# Patient Record
Sex: Male | Born: 1960 | Race: Black or African American | Hispanic: No | Marital: Married | Smoking: Never smoker
Health system: Southern US, Community
[De-identification: ages and names within clinical notes are randomized; demographics above are authoritative.]

## PROBLEM LIST (undated history)

## (undated) DIAGNOSIS — N289 Disorder of kidney and ureter, unspecified: Secondary | ICD-10-CM

## (undated) DIAGNOSIS — I10 Essential (primary) hypertension: Secondary | ICD-10-CM

## (undated) DIAGNOSIS — N189 Chronic kidney disease, unspecified: Secondary | ICD-10-CM

## (undated) DIAGNOSIS — I639 Cerebral infarction, unspecified: Secondary | ICD-10-CM

## (undated) DIAGNOSIS — E119 Type 2 diabetes mellitus without complications: Secondary | ICD-10-CM

## (undated) HISTORY — PX: HERNIA REPAIR: SHX51

## (undated) HISTORY — PX: BRAIN SURGERY: SHX531

## (undated) HISTORY — PX: GASTROSTOMY TUBE PLACEMENT: SHX655

## (undated) HISTORY — DX: Chronic kidney disease, unspecified: N18.9

## (undated) HISTORY — DX: Disorder of kidney and ureter, unspecified: N28.9

## (undated) HISTORY — PX: TRACHEOSTOMY: SUR1362

---

## 2014-02-08 DIAGNOSIS — I10 Essential (primary) hypertension: Secondary | ICD-10-CM | POA: Diagnosis not present

## 2014-02-08 DIAGNOSIS — N183 Chronic kidney disease, stage 3 (moderate): Secondary | ICD-10-CM | POA: Diagnosis not present

## 2014-02-08 DIAGNOSIS — E1129 Type 2 diabetes mellitus with other diabetic kidney complication: Secondary | ICD-10-CM | POA: Diagnosis not present

## 2014-02-08 DIAGNOSIS — I69959 Hemiplegia and hemiparesis following unspecified cerebrovascular disease affecting unspecified side: Secondary | ICD-10-CM | POA: Diagnosis not present

## 2014-03-09 DIAGNOSIS — E1129 Type 2 diabetes mellitus with other diabetic kidney complication: Secondary | ICD-10-CM | POA: Diagnosis not present

## 2014-03-09 DIAGNOSIS — I69351 Hemiplegia and hemiparesis following cerebral infarction affecting right dominant side: Secondary | ICD-10-CM | POA: Diagnosis not present

## 2014-03-09 DIAGNOSIS — I10 Essential (primary) hypertension: Secondary | ICD-10-CM | POA: Diagnosis not present

## 2014-03-09 DIAGNOSIS — G4733 Obstructive sleep apnea (adult) (pediatric): Secondary | ICD-10-CM | POA: Diagnosis not present

## 2014-03-09 DIAGNOSIS — Z1389 Encounter for screening for other disorder: Secondary | ICD-10-CM | POA: Diagnosis not present

## 2014-03-09 DIAGNOSIS — Z77011 Contact with and (suspected) exposure to lead: Secondary | ICD-10-CM | POA: Diagnosis not present

## 2014-06-06 ENCOUNTER — Encounter (HOSPITAL_COMMUNITY): Payer: Self-pay | Admitting: *Deleted

## 2014-06-06 ENCOUNTER — Emergency Department (HOSPITAL_COMMUNITY): Payer: Medicare Other

## 2014-06-06 ENCOUNTER — Emergency Department (HOSPITAL_COMMUNITY)
Admission: EM | Admit: 2014-06-06 | Discharge: 2014-06-06 | Disposition: A | Discharge status: Home / Self Care | Payer: Medicare Other | Attending: Emergency Medicine | Admitting: Emergency Medicine

## 2014-06-06 DIAGNOSIS — R404 Transient alteration of awareness: Secondary | ICD-10-CM | POA: Diagnosis not present

## 2014-06-06 DIAGNOSIS — R609 Edema, unspecified: Secondary | ICD-10-CM | POA: Insufficient documentation

## 2014-06-06 DIAGNOSIS — I10 Essential (primary) hypertension: Secondary | ICD-10-CM | POA: Insufficient documentation

## 2014-06-06 DIAGNOSIS — Z794 Long term (current) use of insulin: Secondary | ICD-10-CM | POA: Insufficient documentation

## 2014-06-06 DIAGNOSIS — K029 Dental caries, unspecified: Secondary | ICD-10-CM | POA: Diagnosis not present

## 2014-06-06 DIAGNOSIS — Z7951 Long term (current) use of inhaled steroids: Secondary | ICD-10-CM | POA: Insufficient documentation

## 2014-06-06 DIAGNOSIS — E119 Type 2 diabetes mellitus without complications: Secondary | ICD-10-CM | POA: Diagnosis not present

## 2014-06-06 DIAGNOSIS — Z8673 Personal history of transient ischemic attack (TIA), and cerebral infarction without residual deficits: Secondary | ICD-10-CM | POA: Insufficient documentation

## 2014-06-06 DIAGNOSIS — R220 Localized swelling, mass and lump, head: Secondary | ICD-10-CM | POA: Diagnosis not present

## 2014-06-06 DIAGNOSIS — M2550 Pain in unspecified joint: Secondary | ICD-10-CM | POA: Insufficient documentation

## 2014-06-06 DIAGNOSIS — R6884 Jaw pain: Secondary | ICD-10-CM | POA: Diagnosis not present

## 2014-06-06 DIAGNOSIS — Z79899 Other long term (current) drug therapy: Secondary | ICD-10-CM | POA: Diagnosis not present

## 2014-06-06 DIAGNOSIS — R531 Weakness: Secondary | ICD-10-CM | POA: Diagnosis not present

## 2014-06-06 HISTORY — DX: Essential (primary) hypertension: I10

## 2014-06-06 HISTORY — DX: Type 2 diabetes mellitus without complications: E11.9

## 2014-06-06 HISTORY — DX: Cerebral infarction, unspecified: I63.9

## 2014-06-06 LAB — CBC WITH DIFFERENTIAL/PLATELET
Basophils Absolute: 0 10*3/uL (ref 0.0–0.1)
Basophils Relative: 0 % (ref 0–1)
Eosinophils Absolute: 0.1 10*3/uL (ref 0.0–0.7)
Eosinophils Relative: 1 % (ref 0–5)
HEMATOCRIT: 40.1 % (ref 39.0–52.0)
HEMOGLOBIN: 13.9 g/dL (ref 13.0–17.0)
Lymphocytes Relative: 32 % (ref 12–46)
Lymphs Abs: 1.3 10*3/uL (ref 0.7–4.0)
MCH: 28.4 pg (ref 26.0–34.0)
MCHC: 34.7 g/dL (ref 30.0–36.0)
MCV: 82 fL (ref 78.0–100.0)
MONO ABS: 0.4 10*3/uL (ref 0.1–1.0)
MONOS PCT: 9 % (ref 3–12)
NEUTROS ABS: 2.4 10*3/uL (ref 1.7–7.7)
Neutrophils Relative %: 58 % (ref 43–77)
Platelets: 195 10*3/uL (ref 150–400)
RBC: 4.89 MIL/uL (ref 4.22–5.81)
RDW: 13.3 % (ref 11.5–15.5)
WBC: 4.2 10*3/uL (ref 4.0–10.5)

## 2014-06-06 LAB — BASIC METABOLIC PANEL
Anion gap: 5 (ref 5–15)
BUN: 15 mg/dL (ref 6–20)
CALCIUM: 8.7 mg/dL — AB (ref 8.9–10.3)
CO2: 27 mmol/L (ref 22–32)
CREATININE: 1.47 mg/dL — AB (ref 0.61–1.24)
Chloride: 105 mmol/L (ref 101–111)
GFR calc Af Amer: >60 mL/min (ref >60)
GFR, EST NON AFRICAN AMERICAN: 53 mL/min — AB (ref >60)
GLUCOSE: 302 mg/dL — AB (ref 70–99)
Potassium: 3.3 mmol/L — ABNORMAL LOW (ref 3.5–5.1)
Sodium: 137 mmol/L (ref 135–145)

## 2014-06-06 LAB — CBG MONITORING, ED: GLUCOSE-CAPILLARY: 264 mg/dL — AB (ref 70–99)

## 2014-06-06 MED ORDER — HYDROCODONE-ACETAMINOPHEN 5-325 MG PO TABS
1.0000 | ORAL_TABLET | Freq: Once | ORAL | Status: AC
  Administered 2014-06-06: 1 via ORAL
  Filled 2014-06-06: qty 1

## 2014-06-06 MED ORDER — PENICILLIN V POTASSIUM 500 MG PO TABS: 1000.0000 mg | ORAL_TABLET | Freq: Two times a day (BID) | ORAL | Status: DC

## 2014-06-06 MED ORDER — CLINDAMYCIN PHOSPHATE 600 MG/50ML IV SOLN
600.0000 mg | Freq: Once | INTRAVENOUS | Status: AC
  Administered 2014-06-06: 600 mg via INTRAVENOUS
  Filled 2014-06-06: qty 50

## 2014-06-06 MED ORDER — HYDROCODONE-ACETAMINOPHEN 5-325 MG PO TABS: 1.0000 | ORAL_TABLET | Freq: Four times a day (QID) | ORAL | Status: DC | PRN

## 2014-06-06 NOTE — ED Notes (Signed)
Awake. Verbally responsive. A/O x4. Resp even and unlabored. No audible adventitious breath sounds noted. ABC's intact. SR on monitor. No reaction noted to IV ABT.

## 2014-06-06 NOTE — Progress Notes (Signed)
2:30pm. CSW received consult from RN for transportation assistance. CSW met with pt and wife. Family has just moved to [place] supports established, and have tried multiple times to reach friends/neighbors but as yet unable to. Are not familiar with bus routes, and pt is weak and has bilateral foot pain with walking. Pt is able to ambulate. CSW provided patient with taxi voucher. Made RN aware to call taxi [facility] [place], [phone])  [place] Worker [facility] phone: [phone]

## 2014-06-06 NOTE — Discharge Instructions (Signed)
Dental Pain A tooth ache may be caused by cavities (tooth decay). Cavities expose the nerve of the tooth to air and hot or cold temperatures. It may come from an infection or abscess (also called a boil or furuncle) around your tooth. It is also often caused by dental caries (tooth decay). This causes the pain you are having. DIAGNOSIS  Your caregiver can diagnose this problem by exam. TREATMENT   If caused by an infection, it may be treated with medications which kill germs (antibiotics) and pain medications as prescribed by your caregiver. Take medications as directed.  Only take over-the-counter or prescription medicines for pain, discomfort, or fever as directed by your caregiver.  Whether the tooth ache today is caused by infection or dental disease, you should see your dentist as soon as possible for further care. SEEK MEDICAL CARE IF: The exam and treatment you received today has been provided on an emergency basis only. This is not a substitute for complete medical or dental care. If your problem worsens or new problems (symptoms) appear, and you are unable to meet with your dentist, call or return to this location. SEEK IMMEDIATE MEDICAL CARE IF:   You have a fever.  You develop redness and swelling of your face, jaw, or neck.  You are unable to open your mouth.  You have severe pain uncontrolled by pain medicine. MAKE SURE YOU:   Understand these instructions.  Will watch your condition.  Will get help right away if you are not doing well or get worse. Document Released: 01/22/2005 Document Revised: 04/16/2011 Document Reviewed: 09/10/2007 [facility] Patient Information 2015 [place], [place]. This information is not intended to replace advice given to you by your health care provider. Make sure you discuss any questions you have with your health care provider.      Dental Caries Dental caries (also called tooth decay) is the most common oral disease. It can occur at any  age but is more common in children and young adults.  HOW DENTAL CARIES DEVELOPS  The process of decay begins when bacteria and foods (particularly sugars and starches) combine in your mouth to produce plaque. Plaque is a substance that sticks to the hard, outer surface of a tooth (enamel). The bacteria in plaque produce acids that attack enamel. These acids may also attack the root surface of a tooth (cementum) if it is exposed. Repeated attacks dissolve these surfaces and create holes in the tooth (cavities). If left untreated, the acids destroy the other layers of the tooth.  RISK FACTORS  Frequent sipping of sugary beverages.   Frequent snacking on sugary and starchy foods, especially those that easily get stuck in the teeth.   Poor oral hygiene.   Dry mouth.   Substance abuse such as methamphetamine abuse.   Broken or poor-fitting dental restorations.   Eating disorders.   Gastroesophageal reflux disease (GERD).   Certain radiation treatments to the head and neck. SYMPTOMS In the early stages of dental caries, symptoms are seldom present. Sometimes white, chalky areas may be seen on the enamel or other tooth layers. In later stages, symptoms may include:  Pits and holes on the enamel.  Toothache after sweet, hot, or cold foods or drinks are consumed.  Pain around the tooth.  Swelling around the tooth. DIAGNOSIS  Most of the time, dental caries is detected during a regular dental checkup. A diagnosis is made after a thorough medical and dental history is taken and the surfaces of your teeth are  checked for signs of dental caries. Sometimes special instruments, such as lasers, are used to check for dental caries. Dental X-ray exams may be taken so that areas not visible to the eye (such as between the contact areas of the teeth) can be checked for cavities.  TREATMENT  If dental caries is in its early stages, it may be reversed with a fluoride treatment or an application  of a remineralizing agent at the dental office. Thorough brushing and flossing at home is needed to aid these treatments. If it is in its later stages, treatment depends on the location and extent of tooth destruction:   If a small area of the tooth has been destroyed, the destroyed area will be removed and cavities will be filled with a material such as gold, silver amalgam, or composite resin.   If a large area of the tooth has been destroyed, the destroyed area will be removed and a cap (crown) will be fitted over the remaining tooth structure.   If the center part of the tooth (pulp) is affected, a procedure called a root canal will be needed before a filling or crown can be placed.   If most of the tooth has been destroyed, the tooth may need to be pulled (extracted). HOME CARE INSTRUCTIONS You can prevent, stop, or reverse dental caries at home by practicing good oral hygiene. Good oral hygiene includes:  Thoroughly cleaning your teeth at least twice a day with a toothbrush and dental floss.   Using a fluoride toothpaste. A fluoride mouth rinse may also be used if recommended by your dentist or health care provider.   Restricting the amount of sugary and starchy foods and sugary liquids you consume.   Avoiding frequent snacking on these foods and sipping of these liquids.   Keeping regular visits with a dentist for checkups and cleanings. PREVENTION   Practice good oral hygiene.  Consider a dental sealant. A dental sealant is a coating material that is applied by your dentist to the pits and grooves of teeth. The sealant prevents food from being trapped in them. It may protect the teeth for several years.  Ask about fluoride supplements if you live in a community without fluorinated water or with water that has a low fluoride content. Use fluoride supplements as directed by your dentist or health care provider.  Allow fluoride varnish applications to teeth if directed by  your dentist or health care provider. Document Released: 10/14/2001 Document Revised: 06/08/2013 Document Reviewed: 01/25/2012 [facility] Patient Information 2015 [place], [place]. This information is not intended to replace advice given to you by your health care provider. Make sure you discuss any questions you have with your health care provider.   Hyperglycemia Hyperglycemia occurs when the glucose (sugar) in your blood is too high. Hyperglycemia can happen for many reasons, but it most often happens to people who do not know they have diabetes or are not managing their diabetes properly.  CAUSES  Whether you have diabetes or not, there are other causes of hyperglycemia. Hyperglycemia can occur when you have diabetes, but it can also occur in other situations that you might not be as aware of, such as: Diabetes  If you have diabetes and are having problems controlling your blood glucose, hyperglycemia could occur because of some of the following reasons:  Not following your meal plan.  Not taking your diabetes medications or not taking it properly.  Exercising less or doing less activity than you normally do.  Being  sick. Pre-diabetes  This cannot be ignored. Before people develop Type 2 diabetes, they almost always have "pre-diabetes." This is when your blood glucose levels are higher than normal, but not yet high enough to be diagnosed as diabetes. Research has shown that some long-term damage to the body, especially the heart and circulatory system, may already be occurring during pre-diabetes. If you take action to manage your blood glucose when you have pre-diabetes, you may delay or prevent Type 2 diabetes from developing. Stress  If you have diabetes, you may be "diet" controlled or on oral medications or insulin to control your diabetes. However, you may find that your blood glucose is higher than usual in the hospital whether you have diabetes or not. This is often referred to as  "stress hyperglycemia." Stress can elevate your blood glucose. This happens because of hormones put out by the body during times of stress. If stress has been the cause of your high blood glucose, it can be followed regularly by your caregiver. That way he/she can make sure your hyperglycemia does not continue to get worse or progress to diabetes. Steroids  Steroids are medications that act on the infection fighting system (immune system) to block inflammation or infection. One side effect can be a rise in blood glucose. Most people can produce enough extra insulin to allow for this rise, but for those who cannot, steroids make blood glucose levels go even higher. It is not unusual for steroid treatments to "uncover" diabetes that is developing. It is not always possible to determine if the hyperglycemia will go away after the steroids are stopped. A special blood test called an A1c is sometimes done to determine if your blood glucose was elevated before the steroids were started. SYMPTOMS  Thirsty.  Frequent urination.  Dry mouth.  Blurred vision.  Tired or fatigue.  Weakness.  Sleepy.  Tingling in feet or leg. DIAGNOSIS  Diagnosis is made by monitoring blood glucose in one or all of the following ways:  A1c test. This is a chemical found in your blood.  Fingerstick blood glucose monitoring.  Laboratory results. TREATMENT  First, knowing the cause of the hyperglycemia is important before the hyperglycemia can be treated. Treatment may include, but is not be limited to:  Education.  Change or adjustment in medications.  Change or adjustment in meal plan.  Treatment for an illness, infection, etc.  More frequent blood glucose monitoring.  Change in exercise plan.  Decreasing or stopping steroids.  Lifestyle changes. HOME CARE INSTRUCTIONS   Test your blood glucose as directed.  Exercise regularly. Your caregiver will give you instructions about exercise. Pre-diabetes  or diabetes which comes on with stress is helped by exercising.  Eat wholesome, balanced meals. Eat often and at regular, fixed times. Your caregiver or nutritionist will give you a meal plan to guide your sugar intake.  Being at an ideal weight is important. If needed, losing as little as 10 to 15 pounds may help improve blood glucose levels. SEEK MEDICAL CARE IF:   You have questions about medicine, activity, or diet.  You continue to have symptoms (problems such as increased thirst, urination, or weight gain). SEEK IMMEDIATE MEDICAL CARE IF:   You are vomiting or have diarrhea.  Your breath smells fruity.  You are breathing faster or slower.  You are very sleepy or incoherent.  You have numbness, tingling, or pain in your feet or hands.  You have chest pain.  Your symptoms get worse even though  you have been following your caregiver's orders.  If you have any other questions or concerns. Document Released: 07/18/2000 Document Revised: 04/16/2011 Document Reviewed: 05/21/2011 [facility] Patient Information 2015 [place], [place]. This information is not intended to replace advice given to you by your health care provider. Make sure you discuss any questions you have with your health care provider.     Emergency Department Resource Guide 1) Find a Doctor and Pay Out of Pocket Although you won't have to find out who is covered by your insurance plan, it is a good idea to ask around and get recommendations. You will then need to call the office and see if the doctor you have chosen will accept you as a new patient and what types of options they offer for patients who are self-pay. Some doctors offer discounts or will set up payment plans for their patients who do not have insurance, but you will need to ask so you aren't surprised when you get to your appointment.  2) Contact Your Local Health Department Not all health departments have doctors that can see patients for sick visits,  but many do, so it is worth a call to see if yours does. If you don't know where your local health department is, you can check in your phone book. The CDC also has a tool to help you locate your state's health department, and many state websites also have listings of all of their local health departments.  3) Find a Walk-in Clinic If your illness is not likely to be very severe or complicated, you may want to try a walk in clinic. These are popping up all over the country in pharmacies, drugstores, and shopping centers. They're usually staffed by nurse practitioners or physician assistants that have been trained to treat common illnesses and complaints. They're usually fairly quick and inexpensive. However, if you have serious medical issues or chronic medical problems, these are probably not your best option.  No Primary Care Doctor: - Call Health Connect at  [phone] - they can help you locate a primary care doctor that  accepts your insurance, provides certain services, etc. - Physician Referral Service- [phone]  Chronic Pain Problems: Organization         Address  Phone   Notes  [facility]  [phone] Patients need to be referred by their primary care doctor.   Medication Assistance: Organization         Address  Phone   Notes  [facility] Medication [facility] [address] [place], [place] [address] [phone] --Must be a resident of [facility] -- Must have NO insurance coverage whatsoever (no Medicaid/ Medicare, etc.) -- The pt. MUST have a primary care doctor that directs their care regularly and follows them in the community   [facility]  [phone]   [employer]  [phone]    Agencies that provide inexpensive medical care: Organization         Address  Phone   Notes  [facility]  [phone]   [facility]    [phone]   [facility] [address] [place], [place] [address] [phone]   [facility] [address] [place] [phone]   Planned Parenthood    [phone]   [facility]    [phone]   [facility]  [address] [place], [place]  Phone:  [phone], Fax:  [phone] Hours of Operation:  9 am - 6 pm, M-F.  Also accepts Medicaid/Medicare and self-pay.  [facility]  [address], [place] Phone: [phone], Fax: [phone]. Hours of Operation:  8:30 am - 5:30 pm, M-F.  Also accepts Medicaid and self-pay.  [facility] [place] [address], [place] Phone: [phone]   [facility] [address] Natasha Bence [place], [place] [phone], Ext. [phone] Mondays & Thursdays: 7-9 AM.  First 15 patients are seen on a first come, first serve basis.    Medicaid-accepting [facility] Providers:  Organization         Address  Phone   Notes  [facility] [address],  [phone] Also accepts self-pay patients.  [facility] [address] Laurell Josephs [place], [place]  [phone]   [facility] [address], [place] [phone]   [facility] [address], [place] [phone]   Renaye Rakers [address], [place]   [phone] Only accepts [place] Access [place] patients after they have their name applied to their card.   Self-Pay (no insurance) in [facility]:  Organization         Address  Phone   Notes  Sickle Cell Patients, [facility] Medicine [address] [place], [place] [phone]   [facility] [address] [place], [place] [phone]   [facility]  [address], [place] [phone]   [facility]/Dr. Osei-Bonsu  [address]  [place] or [address], [place] [phone] Phone number for both [place] and [place] locations is the same.  [facility] [address] [place] [phone]   [facility] [address], [place] or [address] [phone]   [facility] [address] [place] [phone], phone; [phone], fax Sees patients 1st and 3rd Saturday of every month.  Must not qualify for public or private insurance (i.e. Medicaid, Medicare, [place] Health Choice, Veterans' Benefits)  Household income should be no more than 200% of the poverty level The clinic cannot treat you if you are pregnant or think you are pregnant  Sexually transmitted diseases are not treated at the clinic.    Dental Care: Organization         Address  Phone  Notes  [facility] [address] [place], [place] [phone] Accepts children up to age 61 who are enrolled in [place] or [place] Health Choice; pregnant women with a Medicaid card; and children who have applied for Medicaid or [place] Health Choice, but were declined, whose parents can pay a reduced fee at time of service.  [facility]  [address], [place] [phone] Accepts children up to age 4 who are enrolled in [place] or [place] Health Choice; pregnant women with a Medicaid card; and children who have applied for Medicaid or [place] Health Choice, but were declined, whose parents can pay a reduced fee at time of service.  [facility]  [address] [place], [place] [phone] Patients are seen by appointment only.  Walk-ins are not accepted. [facility] will see patients 69 years of age and older. Monday - Tuesday (8am-5pm) Most Wednesdays (8:30-5pm) $30 per visit, cash only  [facility]  [address]  Dr, [facility] [phone] Patients are seen by appointment only. Walk-ins are not accepted. [facility] will see patients 42 years of age and older. One Wednesday Evening (Monthly: Volunteer Based).  $30 per visit, cash only  [employer]  [phone] for adults; Children under age 32, call Graduate Pediatric Dentistry at [phone]. Children aged 54-14, please call [phone] to request a pediatric application.  Dental services are provided in all areas of dental care including fillings, crowns and bridges, complete and partial dentures, implants, gum treatment, root canals, and extractions. Preventive care is also provided. Treatment is provided to both adults and children. Patients are selected via a lottery and there is often a waiting list.   [facility] [address] [place]  [phone] [email or web address]   [facility] [address] [place], [place] [phone], Ext. [phone] Second and Fourth Thursday of each month, opens at 6:30 AM; Clinic ends at 9 AM.  Patients are seen on a first-come first-served basis, and a limited number are seen during each clinic.   [facility]  [address] Ether Griffins [place], [place] [phone]   Eligibility Requirements You must have lived in [place], [place], or [place] counties for at least the last three months.   You cannot be eligible for state or federal sponsored [place], including [employer], [place], or [employer].   You generally cannot be eligible for healthcare insurance through your employer.    How to apply: Eligibility screenings are held every Tuesday and Wednesday afternoon from 1:00 pm until 4:00 pm. You do not need an appointment for the interview!  [facility] [address] [place], [place] [phone]   [facility] Health Department  [phone]   [facility] Health Department  [phone]   [facility] Health Department  [phone]    Behavioral Health Resources in the Community: Intensive Outpatient Programs Organization         Address  Phone  Notes  [facility] [address], [place], [place] [phone]   [facility] Outpatient [address] [place], [place] [phone]   [facility] [address] [place], [place]  [phone]   [facility] Mental Health [address],  [place], [place] [phone] or [phone]   Substance Abuse Resources Organization         Address  Phone  Notes  [facility]  [phone]   [facility]  [phone]   [facility]  [phone]   Floydene Flock  [phone]   Residential & Outpatient Substance Abuse Program  [phone]   Psychological Services Organization         Address  Phone  Notes  [facility] Behavioral Health  [phone]   [facility] Services  [phone]   [facility] Mental Health [address] [place] [phone] or [phone]    Mobile Crisis Teams Organization         Address  Phone  Notes  [facility], Mobile Crisis Care Unit  [phone]   [facility]  [address] [place], [place] [phone]   [facility] [address] [place] [phone]  Self-Help/Support Groups Organization         Address  Phone             Notes  [facility] [place] - variety of support groups  336- I7437963 Call for more information  Narcotics Anonymous (NA), [facility] [address], [employer] [place]  2 meetings at this location   Statistician         Address  Phone  Notes  [facility] [address],    [place]  [phone]   [facility]  [address], [place] [address], [place], [place] [phone]   [facility] Treatment Facility [address] [place], [place]  [phone] Admissions: 8am-3pm M-F  [facility] [address],    [place], [place] [phone]   [facility] [address] [place], [place], [place] [phone]   [facility] [address]  [place], [place] [phone]   [facility] - Intensive Outpatient [address] Laurell Josephs [address], [place], [place] [phone]   [facility] ([facility] [address] [place]  [place], [place] [phone] or [phone]   [facility] ([facility]) [address], [place], [place] [phone] Accepts Medicaid  [facility] [address]  [place] [phone] Substance Abuse/Addiction Treatment   [facility] Organization         Address  Phone  Notes  [facility]  [phone]   Angie Fava, PhD [address] [place]vin [place], [place]   [phone] or [phone]   [facility] Behavioral   [address] [place], [place] [phone]   [facility] [address], [place], [place] [phone] Insurance/Medicaid/sponsorship through [facility] [address]                                    [place], [place] [phone] Therapy/tele-psych/case  [facility] [address], [place] [phone]    Dr. Lolly Mustache  [phone]   [facility]  [employer] [facility] Dept. 1) [address], [place] 2) [address], [place] 3)  [address], [place] [phone]  [phone]   [facility] Child Abuse Hotline 618 152 1860 or [phone] (After Hours)

## 2014-06-06 NOTE — ED Provider Notes (Signed)
CSN: [id number]     Arrival date & time 06/06/14  1135 History   First MD Initiated Contact with Patient 06/06/14 1141     Chief Complaint  Patient presents with  . Foot Pain  . Jaw Pain     (Consider location/radiation/quality/duration/timing/severity/associated sxs/prior Treatment) HPI  54 year old male presents with right-sided jaw pain for the past 3 days. Similar symptoms when he had severe dental disease that eventually led to a stroke causing right-sided deficits. The patient denies any chest pain or shortness of breath. Family nurse states they think is right face is slightly swollen. No redness. No fevers or chills. He is also been complaining of bilateral foot pain with walking. Has had some mild swelling but this is unchanged from several months. No unilateral swelling. No injuries. Denies any pain at this time.  Past Medical History  Diagnosis Date  . Diabetes mellitus without complication   . Stroke   . Hypertension    No past surgical history on file. No family history on file. History  Substance Use Topics  . Smoking status: Never Smoker   . Smokeless tobacco: Not on file  . Alcohol Use: No    Review of Systems  Constitutional: Negative for fever.  HENT: Positive for dental problem. Negative for trouble swallowing and voice change.   Respiratory: Negative for shortness of breath.   Cardiovascular: Negative for chest pain and leg swelling.  Gastrointestinal: Negative for vomiting.  Musculoskeletal: Positive for arthralgias.  All other systems reviewed and are negative.     Allergies  Contrast media  Home Medications   Prior to Admission medications   Medication Sig Start Date End Date Taking? Authorizing Provider  amLODipine (NORVASC) 10 MG tablet Take 10 mg by mouth daily.   Yes Historical Provider, MD  atorvastatin (LIPITOR) 40 MG tablet Take 40 mg by mouth at bedtime.   Yes Historical Provider, MD  carvedilol (COREG) 25 MG tablet Take 50 mg by mouth 2  (two) times daily with a meal.   Yes Historical Provider, MD  cloNIDine (CATAPRES) 0.2 MG tablet Take 0.2 mg by mouth 3 (three) times daily.   Yes Historical Provider, MD  fluticasone (FLONASE) 50 MCG/ACT nasal spray Place 1 spray into both nostrils daily.   Yes Historical Provider, MD  furosemide (LASIX) 20 MG tablet Take 20 mg by mouth 2 (two) times daily.   Yes Historical Provider, MD  glimepiride (AMARYL) 4 MG tablet Take 4 mg by mouth 2 (two) times daily.   Yes Historical Provider, MD  hydrALAZINE (APRESOLINE) 50 MG tablet Take 50 mg by mouth 3 (three) times daily.   Yes Historical Provider, MD  insulin aspart (NOVOLOG) 100 UNIT/ML injection Inject 28-32 Units into the skin 3 (three) times daily before meals.   Yes Historical Provider, MD  insulin detemir (LEVEMIR) 100 UNIT/ML injection Inject 48 Units into the skin at bedtime.   Yes Historical Provider, MD   BP 128/74 mmHg  Pulse 62  Temp(Src) 97.3 F (36.3 C) (Oral)  Resp 18  SpO2 99% Physical Exam  Constitutional: He is oriented to person, place, and time. He appears well-developed and well-nourished.  HENT:  Head: Normocephalic and atraumatic.  Right Ear: External ear normal.  Left Ear: External ear normal.  Nose: Nose normal.  Mouth/Throat: Oropharynx is clear and moist. No oropharyngeal exudate.    No focal tenderness, swelling or warmth to right face/jaw  Eyes: Right eye exhibits no discharge. Left eye exhibits no discharge.  Neck: Neck supple.  Cardiovascular: Normal rate, regular rhythm, normal heart sounds and intact distal pulses.   Pulses:      Dorsalis pedis pulses are 2+ on the right side, and 2+ on the left side.  Pulmonary/Chest: Effort normal and breath sounds normal. He has no rales.  Abdominal: Soft. He exhibits no distension. There is no tenderness.  Musculoskeletal: He exhibits edema (1+ pedal edema).       Right ankle: He exhibits normal range of motion. No tenderness.       Left ankle: He exhibits normal  range of motion. No tenderness.       Right foot: There is normal range of motion and no tenderness.       Left foot: There is normal range of motion and no tenderness.  Poorly upkept toe nails but no signs of acute infection  Neurological: He is alert and oriented to person, place, and time.  Skin: Skin is warm and dry.  Nursing note and vitals reviewed.   ED Course  Procedures (including critical care time) Labs Review Labs Reviewed  BASIC METABOLIC PANEL - Abnormal; Notable for the following:    Potassium 3.3 (*)    Glucose, Bld 302 (*)    Creatinine, Ser 1.47 (*)    Calcium 8.7 (*)    GFR calc non Af Amer 53 (*)    All other components within normal limits  CBG MONITORING, ED - Abnormal; Notable for the following:    Glucose-Capillary 264 (*)    All other components within normal limits  CBC WITH DIFFERENTIAL/PLATELET    Imaging Review Ct Maxillofacial Wo Cm  06/06/2014   CLINICAL DATA:  Right jaw pain/swelling x2 days. History of cranial aneurysm surgery.  EXAM: CT MAXILLOFACIAL WITHOUT CONTRAST  TECHNIQUE: Multidetector CT imaging of the maxillofacial structures was performed. Multiplanar CT image reconstructions were also generated. A small metallic BB was placed on the right temple in order to reliably differentiate right from left.  COMPARISON:  None.  FINDINGS: No evidence of maxillofacial fracture.  Mandible is intact. Bilateral mandibular condyles are well-seated in the TMJs.  No evidence of odontogenic abscess.  The bilateral parotid and submandibular glands are unremarkable.  The visualized paranasal sinuses are essentially clear. The mastoid air cells are unopacified.  Bilateral orbits, including the globes and retroconal soft tissues, are within normal limits.  Visualized brain parenchyma is unremarkable.  Cervical spine is notable for degenerative changes at C6-7.  Postsurgical changes involving the left frontal bone. No evidence of calvarial fracture.  IMPRESSION:  Negative maxillofacial CT.   Electronically Signed   By: Charline Bills M.D.   On: 06/06/2014 13:22     EKG Interpretation None      MDM   Final diagnoses:  Dental caries    Patient has evidence of right mandibular caries but no evidence of more severe disease. Given that he is a diabetic and complaining of facial swelling (I do not appreciate this) he was given a dose of IV antibiotics. WBC is normal, no fevers, no signs of systemic disease. Will treat with oral antibiotics and oral pain control. As for his bilateral leg pain with walking, there is no evidence of acute bony injury, current tenderness, or current trauma to suggest needing an x-ray. Minimal pedal edema that is unchanged from his normal. No unilateral swelling to suggest a DVT. Discussed importance of finding a PCP given he recently moved to this area, as well as better control of his glucose.    Pricilla Loveless,  MD 06/06/14 1610

## 2014-06-06 NOTE — ED Notes (Signed)
Pt complains of pain and swelling in his feet and jaw for the past 2 days. Pt has hx of diabetes and stroke, has weakness on his right side. CBG 386.

## 2014-06-06 NOTE — ED Notes (Signed)
Patient transported to CT 

## 2014-06-11 ENCOUNTER — Ambulatory Visit: Payer: Medicare Other | Attending: Internal Medicine | Admitting: Internal Medicine

## 2014-06-11 ENCOUNTER — Encounter: Payer: Self-pay | Admitting: Internal Medicine

## 2014-06-11 VITALS — BP 137/91 | HR 73 | Temp 97.6°F | Resp 16 | Ht 71.5 in | Wt 265.0 lb

## 2014-06-11 DIAGNOSIS — E1122 Type 2 diabetes mellitus with diabetic chronic kidney disease: Secondary | ICD-10-CM | POA: Insufficient documentation

## 2014-06-11 DIAGNOSIS — I69320 Aphasia following cerebral infarction: Secondary | ICD-10-CM | POA: Diagnosis not present

## 2014-06-11 DIAGNOSIS — I10 Essential (primary) hypertension: Secondary | ICD-10-CM | POA: Diagnosis not present

## 2014-06-11 DIAGNOSIS — K0889 Other specified disorders of teeth and supporting structures: Secondary | ICD-10-CM

## 2014-06-11 DIAGNOSIS — Z794 Long term (current) use of insulin: Secondary | ICD-10-CM | POA: Diagnosis not present

## 2014-06-11 DIAGNOSIS — Z8673 Personal history of transient ischemic attack (TIA), and cerebral infarction without residual deficits: Secondary | ICD-10-CM | POA: Diagnosis not present

## 2014-06-11 DIAGNOSIS — K088 Other specified disorders of teeth and supporting structures: Secondary | ICD-10-CM | POA: Insufficient documentation

## 2014-06-11 DIAGNOSIS — E119 Type 2 diabetes mellitus without complications: Secondary | ICD-10-CM

## 2014-06-11 DIAGNOSIS — I129 Hypertensive chronic kidney disease with stage 1 through stage 4 chronic kidney disease, or unspecified chronic kidney disease: Secondary | ICD-10-CM | POA: Insufficient documentation

## 2014-06-11 DIAGNOSIS — N189 Chronic kidney disease, unspecified: Secondary | ICD-10-CM | POA: Insufficient documentation

## 2014-06-11 DIAGNOSIS — E785 Hyperlipidemia, unspecified: Secondary | ICD-10-CM | POA: Diagnosis not present

## 2014-06-11 DIAGNOSIS — Z7951 Long term (current) use of inhaled steroids: Secondary | ICD-10-CM | POA: Diagnosis not present

## 2014-06-11 DIAGNOSIS — I69351 Hemiplegia and hemiparesis following cerebral infarction affecting right dominant side: Secondary | ICD-10-CM | POA: Insufficient documentation

## 2014-06-11 LAB — POCT GLYCOSYLATED HEMOGLOBIN (HGB A1C): HEMOGLOBIN A1C: 9.9

## 2014-06-11 LAB — GLUCOSE, POCT (MANUAL RESULT ENTRY): POC Glucose: 93 mg/dl (ref 70–99)

## 2014-06-11 MED ORDER — CLONIDINE HCL 0.2 MG PO TABS: 0.2000 mg | ORAL_TABLET | Freq: Three times a day (TID) | ORAL | Status: DC

## 2014-06-11 MED ORDER — AMLODIPINE BESYLATE 10 MG PO TABS: 10.0000 mg | ORAL_TABLET | Freq: Every day | ORAL | Status: DC

## 2014-06-11 MED ORDER — FUROSEMIDE 20 MG PO TABS: 20.0000 mg | ORAL_TABLET | Freq: Two times a day (BID) | ORAL | Status: DC

## 2014-06-11 MED ORDER — INSULIN ASPART 100 UNIT/ML ~~LOC~~ SOLN
28.0000 [IU] | Freq: Three times a day (TID) | SUBCUTANEOUS | Status: DC
Start: 2014-06-11 — End: 2014-08-12

## 2014-06-11 MED ORDER — INSULIN DETEMIR 100 UNIT/ML ~~LOC~~ SOLN: 48.0000 [IU] | Freq: Every day | SUBCUTANEOUS | Status: DC

## 2014-06-11 MED ORDER — ATORVASTATIN CALCIUM 40 MG PO TABS: 40.0000 mg | ORAL_TABLET | Freq: Every day | ORAL | Status: DC

## 2014-06-11 MED ORDER — CARVEDILOL 25 MG PO TABS: 50.0000 mg | ORAL_TABLET | Freq: Two times a day (BID) | ORAL | Status: DC

## 2014-06-11 MED ORDER — HYDRALAZINE HCL 50 MG PO TABS: 50.0000 mg | ORAL_TABLET | Freq: Three times a day (TID) | ORAL | Status: DC

## 2014-06-11 MED ORDER — GLIMEPIRIDE 4 MG PO TABS: 4.0000 mg | ORAL_TABLET | Freq: Two times a day (BID) | ORAL | Status: DC

## 2014-06-11 NOTE — Progress Notes (Signed)
Patient ID: Justin EeRichard Molina, male   DOB: 04/04/1960, [age] y.o.   MRN: [id number]  [id number]:[id number]  [id number]  DOB - 04/04/1960  CC:  Chief Complaint  Patient presents with  . Establish Care       HPI: Justin Molina is a 54 y.o. male here today to establish medical care. 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  Patient's wife gives history. They recently moved here from [place] and need to establish with a primary care provider. He currently takes 48 units of levemir and 32 units of Novolog TID. She is unsure of his last hemoglobin a1c. She would like a referral to a dentist because he has been having a significant amount of dental pain and was recently seen in the ER. She reports that the left side of his face continues to have swelling even with antibiotic use and she is concerned about the infection.  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 She is requesting a podiatry visit to continue care from [place].    Allergies  Allergen Reactions  . Contrast Media [Iodinated Diagnostic Agents] Nausea And Vomiting and Swelling   Past Medical History  Diagnosis Date  . Diabetes mellitus without complication   . Stroke   . Hypertension    Current Outpatient Prescriptions on File Prior to Visit  Medication Sig Dispense Refill  . amLODipine (NORVASC) 10 MG tablet Take 10 mg by mouth daily.    Marland Kitchen. atorvastatin (LIPITOR) 40 MG tablet Take 40 mg by mouth at bedtime.    . carvedilol (COREG) 25 MG tablet Take 50 mg by mouth 2 (two) times daily with a meal.    . cloNIDine (CATAPRES) 0.2 MG tablet Take 0.2 mg by mouth 3 (three) times daily.    . fluticasone (FLONASE) 50 MCG/ACT nasal spray Place 1 spray into both nostrils daily.    . furosemide (LASIX) 20 MG tablet Take 20 mg by mouth 2 (two) times daily.    Marland Kitchen. glimepiride (AMARYL) 4 MG tablet Take 4 mg  by mouth 2 (two) times daily.    . hydrALAZINE (APRESOLINE) 50 MG tablet Take 50 mg by mouth 3 (three) times daily.    . insulin aspart (NOVOLOG) 100 UNIT/ML injection Inject 28-32 Units into the skin 3 (three) times daily before meals.    . insulin detemir (LEVEMIR) 100 UNIT/ML injection Inject 48 Units into the skin at bedtime.    . penicillin v potassium (VEETID) 500 MG tablet Take 2 tablets (1,000 mg total) by mouth 2 (two) times daily. X 7 days 28 tablet 0  . HYDROcodone-acetaminophen (NORCO) 5-325 MG per tablet Take 1 tablet by mouth every 6 (six) hours as needed for severe pain. (Patient not taking: Reported on 06/11/2014) 20 tablet 0   No current facility-administered medications on file prior to visit.   History reviewed. No pertinent family history. History   Social History  . Marital Status: Married    Spouse Name: N/A  . Number of Children: N/A  . Years of Education: N/A   Occupational History  . Not on file.   Social History Main Topics  . Smoking status: Never Smoker   . Smokeless tobacco: Not on file  . Alcohol Use: No  . Drug Use: Not on file  . Sexual Activity: Not on file   Other Topics Concern  . Not on file  Social History Narrative    Review of Systems  Genitourinary: Positive for frequency.  Neurological: Positive for dizziness and tingling.  Endo/Heme/Allergies: Positive for polydipsia.  All other systems reviewed and are negative.   Objective:   Filed Vitals:   06/11/14 1145  BP: 137/91  Pulse: 73  Temp: 97.6 F (36.4 C)  Resp: 16    Physical Exam  Constitutional: He is oriented to person, place, and time.  HENT:  Right Ear: External ear normal.  Left Ear: External ear normal.  Mouth/Throat: Oropharynx is clear and moist.  Eyes: EOM are normal. Pupils are equal, round, and reactive to light.  Neck: Normal range of motion. Neck supple.  Cardiovascular: Normal rate, regular rhythm and normal heart sounds.   Pulses:      Dorsalis pedis  pulses are 2+ on the right side, and 2+ on the left side.       Posterior tibial pulses are 2+ on the right side, and 2+ on the left side.  Pulmonary/Chest: Effort normal and breath sounds normal.  Feet:  Right Foot:  Skin Integrity: Negative for skin breakdown.  Left Foot:  Skin Integrity: Negative for skin breakdown.  Neurological: He is alert and oriented to person, place, and time.  Global aphasia  Psychiatric: He has a normal mood and affect.     Lab Results  Component Value Date   WBC 4.2 06/06/2014   HGB 13.9 06/06/2014   HCT 40.1 06/06/2014   MCV 82.0 06/06/2014   PLT 195 06/06/2014   Lab Results  Component Value Date   CREATININE 1.47* 06/06/2014   BUN 15 06/06/2014   NA 137 06/06/2014   K 3.3* 06/06/2014   CL 105 06/06/2014   CO2 27 06/06/2014    Lab Results  Component Value Date   HGBA1C 9.90 06/11/2014   Lipid Panel  No results found for: CHOL, TRIG, HDL, CHOLHDL, VLDL, LDLCALC     Assessment and plan:   Justin Molina was seen today for establish care.  Diagnoses and all orders for this visit:  Type 2 diabetes mellitus without complication Orders: -     Glucose (CBG) -     HgB A1c -     Ambulatory referral to Podiatry -     insulin detemir (LEVEMIR) 100 UNIT/ML injection; Inject 0.48 mLs (48 Units total) into the skin at bedtime. -     insulin aspart (NOVOLOG) 100 UNIT/ML injection; Inject 28-32 Units into the skin 3 (three) times daily before meals. Continue current regimen. Will have patient return with blood sugar log   Essential hypertension Orders: -    refill hydrALAZINE (APRESOLINE) 50 MG tablet; Take 1 tablet (50 mg total) by mouth 3 (three) times daily. -    Refill glimepiride (AMARYL) 4 MG tablet; Take 1 tablet (4 mg total) by mouth 2 (two) times daily. -    refill cloNIDine (CATAPRES) 0.2 MG tablet; Take 1 tablet (0.2 mg total) by mouth 3 (three) times daily. -     Refill carvedilol (COREG) 25 MG tablet; Take 2 tablets (50 mg total) by  mouth 2 (two) times daily with a meal. -     Refill amLODipine (NORVASC) 10 MG tablet; Take 1 tablet (10 mg total) by mouth daily. -     Ambulatory referral to Cardiology---per wife request, wants to continue with cardiology since he had one in [place]. 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   CKD (chronic kidney disease), unspecified stage Orders: -     Refill furosemide (LASIX) 20 MG tablet; Take 1 tablet (20 mg total) by mouth 2 (two) times daily.  HLD (hyperlipidemia) Orders: -    refill atorvastatin (LIPITOR) 40 MG tablet; Take 1 tablet (40 mg total) by mouth at bedtime. Education provided on proper lifestyle changes in order to lower cholesterol. Patient advised to maintain healthy weight and to keep total fat intake at 25-35% of total calories and carbohydrates 50-60% of total daily calories. Explained how high cholesterol places patient at risk for heart disease. Patient placed on appropriate medication and repeat labs in 6 months   Pain, dental Orders: -     Ambulatory referral to Dentistry   Return in about 1 week (around 06/18/2014) for Lab Visit and 3 mo PCP .    Holland Commons, NP-C [facility] [phone] 06/11/2014, 12:18 PM

## 2014-06-11 NOTE — Progress Notes (Signed)
Pt is here to establish care. Pt needs to see a podiatrist. Pt has a history of diabetes, HTN and he suffered 2 strokes.

## 2014-06-16 ENCOUNTER — Other Ambulatory Visit: Payer: Self-pay | Admitting: Internal Medicine

## 2014-06-16 ENCOUNTER — Telehealth: Payer: Self-pay | Admitting: Internal Medicine

## 2014-06-16 DIAGNOSIS — K0889 Other specified disorders of teeth and supporting structures: Secondary | ICD-10-CM

## 2014-06-16 MED ORDER — ACETAMINOPHEN-CODEINE #3 300-30 MG PO TABS: 1.0000 | ORAL_TABLET | Freq: Three times a day (TID) | ORAL | Status: DC | PRN

## 2014-06-16 NOTE — Telephone Encounter (Signed)
Patient's wife called requesting to speak to nurse regarding referrals, and wanting to know what patient can do has been in pain and not feeling better. Please f/u with patient

## 2014-06-22 ENCOUNTER — Ambulatory Visit: Payer: Self-pay | Admitting: Neurology

## 2014-06-24 ENCOUNTER — Ambulatory Visit: Payer: Medicare Other | Admitting: Neurology

## 2014-06-25 ENCOUNTER — Encounter: Payer: Self-pay | Admitting: Neurology

## 2014-07-04 ENCOUNTER — Emergency Department (HOSPITAL_COMMUNITY)
Admission: EM | Admit: 2014-07-04 | Discharge: 2014-07-04 | Disposition: A | Discharge status: Home / Self Care | Payer: Medicare Other | Attending: Emergency Medicine | Admitting: Emergency Medicine

## 2014-07-04 ENCOUNTER — Encounter (HOSPITAL_COMMUNITY): Payer: Self-pay | Admitting: Family Medicine

## 2014-07-04 DIAGNOSIS — I10 Essential (primary) hypertension: Secondary | ICD-10-CM | POA: Diagnosis not present

## 2014-07-04 DIAGNOSIS — K1379 Other lesions of oral mucosa: Secondary | ICD-10-CM

## 2014-07-04 DIAGNOSIS — Z794 Long term (current) use of insulin: Secondary | ICD-10-CM | POA: Diagnosis not present

## 2014-07-04 DIAGNOSIS — K088 Other specified disorders of teeth and supporting structures: Secondary | ICD-10-CM | POA: Insufficient documentation

## 2014-07-04 DIAGNOSIS — K029 Dental caries, unspecified: Secondary | ICD-10-CM | POA: Insufficient documentation

## 2014-07-04 DIAGNOSIS — Z79899 Other long term (current) drug therapy: Secondary | ICD-10-CM | POA: Insufficient documentation

## 2014-07-04 DIAGNOSIS — Z7951 Long term (current) use of inhaled steroids: Secondary | ICD-10-CM | POA: Diagnosis not present

## 2014-07-04 DIAGNOSIS — E119 Type 2 diabetes mellitus without complications: Secondary | ICD-10-CM | POA: Insufficient documentation

## 2014-07-04 DIAGNOSIS — Z8673 Personal history of transient ischemic attack (TIA), and cerebral infarction without residual deficits: Secondary | ICD-10-CM | POA: Insufficient documentation

## 2014-07-04 MED ORDER — OXYCODONE-ACETAMINOPHEN 7.5-325 MG PO TABS: 1.0000 | ORAL_TABLET | ORAL | Status: DC | PRN

## 2014-07-04 MED ORDER — OXYCODONE-ACETAMINOPHEN 5-325 MG PO TABS
1.0000 | ORAL_TABLET | Freq: Once | ORAL | Status: AC
  Administered 2014-07-04: 1 via ORAL

## 2014-07-04 MED ORDER — OXYCODONE-ACETAMINOPHEN 5-325 MG PO TABS
ORAL_TABLET | ORAL | Status: AC
  Filled 2014-07-04: qty 1

## 2014-07-04 NOTE — ED Notes (Signed)
Pt here for mouth pain. sts has been going on for a month. sts he has been on abx and pain meds in the past. sts also pt very anxious and has been slow to respond today. sts hx of stroke with right side deficit.

## 2014-07-04 NOTE — ED Notes (Signed)
Pt has hx of aneurysm x 2, stroke in 2009, mini stroke in May 2015, pt moved here from [place] -- wife states that pt had infected teeth last year causing ministroke.  States Percocet has not helped yet today-  Was seen at [facility] 1 month ago for same, received antibiotics IV and rx for antibiotics and pain meds. , wife called 12 dentists and no one would see him with medicare. Medicaid has not gone through.

## 2014-07-04 NOTE — ED Notes (Signed)
Pt d/c'd from continuous pulse oximetry and blood pressure cuff;pt already dressed; visitors at bedside; wheelchair outside door for discharge

## 2014-07-04 NOTE — ED Notes (Signed)
[facility], states that has not had follow up-- wife concerned about neuropathy and pain.

## 2014-07-04 NOTE — ED Provider Notes (Addendum)
CSN: [id number]     Arrival date & time 07/04/14  1246 History   First MD Initiated Contact with Patient 07/04/14 1507     Chief Complaint  Patient presents with  . Dental Pain     (Consider location/radiation/quality/duration/timing/severity/associated sxs/prior Treatment) HPI Comments: Pt here c/o one month of right jaw and maxillary pain--seen here a month ago for same and given pain meds and abx--sx did slightly improve but now worse--pain is sharp and worse with movement--denies fever, chills--no facial swelling--h/o tooth extraction and is being set up to see dentistry at [facility]--sx persistent and pt not taking meds at home  Patient is a 54 y.o. male presenting with tooth pain. The history is provided by the patient and the spouse.  Dental Pain   Past Medical History  Diagnosis Date  . Diabetes mellitus without complication   . Stroke   . Hypertension    History reviewed. No pertinent past surgical history. History reviewed. No pertinent family history. History  Substance Use Topics  . Smoking status: Never Smoker   . Smokeless tobacco: Not on file  . Alcohol Use: No    Review of Systems  All other systems reviewed and are negative.     Allergies  Contrast media  Home Medications   Prior to Admission medications   Medication Sig Start Date End Date Taking? Authorizing Provider  acetaminophen-codeine (TYLENOL #3) 300-30 MG per tablet Take 1 tablet by mouth every 8 (eight) hours as needed for moderate pain. 06/16/14   Ambrose Finland, NP  amLODipine (NORVASC) 10 MG tablet Take 1 tablet (10 mg total) by mouth daily. 06/11/14   Ambrose Finland, NP  atorvastatin (LIPITOR) 40 MG tablet Take 1 tablet (40 mg total) by mouth at bedtime. 06/11/14   Ambrose Finland, NP  carvedilol (COREG) 25 MG tablet Take 2 tablets (50 mg total) by mouth 2 (two) times daily with a meal. 06/11/14   Ambrose Finland, NP  cloNIDine (CATAPRES) 0.2 MG tablet Take 1 tablet (0.2 mg total) by mouth 3 (three)  times daily. 06/11/14   Ambrose Finland, NP  fluticasone (FLONASE) 50 MCG/ACT nasal spray Place 1 spray into both nostrils daily.    Historical Provider, MD  furosemide (LASIX) 20 MG tablet Take 1 tablet (20 mg total) by mouth 2 (two) times daily. 06/11/14   Ambrose Finland, NP  glimepiride (AMARYL) 4 MG tablet Take 1 tablet (4 mg total) by mouth 2 (two) times daily. 06/11/14   Ambrose Finland, NP  hydrALAZINE (APRESOLINE) 50 MG tablet Take 1 tablet (50 mg total) by mouth 3 (three) times daily. 06/11/14   Ambrose Finland, NP  HYDROcodone-acetaminophen (NORCO) 5-325 MG per tablet Take 1 tablet by mouth every 6 (six) hours as needed for severe pain. Patient not taking: Reported on 06/11/2014 06/06/14   Pricilla Loveless, MD  insulin aspart (NOVOLOG) 100 UNIT/ML injection Inject 28-32 Units into the skin 3 (three) times daily before meals. 06/11/14   Ambrose Finland, NP  insulin detemir (LEVEMIR) 100 UNIT/ML injection Inject 0.48 mLs (48 Units total) into the skin at bedtime. 06/11/14   Ambrose Finland, NP  penicillin v potassium (VEETID) 500 MG tablet Take 2 tablets (1,000 mg total) by mouth 2 (two) times daily. X 7 days 06/06/14   Pricilla Loveless, MD   BP 146/88 mmHg  Pulse 72  Temp(Src) 98.2 F (36.8 C) (Oral)  Resp 22  SpO2 97% Physical Exam  Constitutional: He is oriented to  person, place, and time. He appears well-developed and well-nourished.  Non-toxic appearance. No distress.  HENT:  Head: Normocephalic and atraumatic.  Mouth/Throat: No oral lesions. Dental caries present. No dental abscesses. No oropharyngeal exudate or posterior oropharyngeal edema.  Right upper gum line without evidence of infection--no draining abscess  Eyes: Conjunctivae, EOM and lids are normal. Pupils are equal, round, and reactive to light.  Neck: Normal range of motion. Neck supple. No tracheal deviation present. No thyroid mass present.  Cardiovascular: Normal rate, regular rhythm and normal heart sounds.  Exam reveals no gallop.   No  murmur heard. Pulmonary/Chest: Effort normal and breath sounds normal. No stridor. No respiratory distress. He has no decreased breath sounds. He has no wheezes. He has no rhonchi. He has no rales.  Abdominal: Soft. Normal appearance and bowel sounds are normal. He exhibits no distension. There is no tenderness. There is no rebound and no CVA tenderness.  Musculoskeletal: Normal range of motion. He exhibits no edema or tenderness.  Neurological: He is alert and oriented to person, place, and time. He has normal strength. No cranial nerve deficit or sensory deficit. GCS eye subscore is 4. GCS verbal subscore is 5. GCS motor subscore is 6.  At baseline per patient  Skin: Skin is warm and dry. No abrasion and no rash noted.  Psychiatric: He has a normal mood and affect. His speech is normal and behavior is normal.  Nursing note and vitals reviewed.   ED Course  Procedures (including critical care time) Labs Review Labs Reviewed - No data to display  Imaging Review No results found.   EKG Interpretation None      MDM   Final diagnoses:  None    Pt without evidence of facial abscess, no infection at prior extraction, diffuse dental carries noted without abscess, will give pain meds and pt encouraged to f/u with dental    Lorre NickAnthony Virginie Josten, MD 07/04/14 1528  Lorre NickAnthony Alyanah Elliott, MD 07/13/14 1145

## 2014-07-04 NOTE — Discharge Instructions (Signed)
Return here for fever, facial swelling, trouble swallowing, or any other problems--call the dental clinic that you were given a referral to set up an appointment to be seen sooner

## 2014-07-13 DIAGNOSIS — L03032 Cellulitis of left toe: Secondary | ICD-10-CM | POA: Diagnosis not present

## 2014-07-13 DIAGNOSIS — M792 Neuralgia and neuritis, unspecified: Secondary | ICD-10-CM | POA: Diagnosis not present

## 2014-07-13 DIAGNOSIS — M79609 Pain in unspecified limb: Secondary | ICD-10-CM | POA: Diagnosis not present

## 2014-07-13 DIAGNOSIS — B351 Tinea unguium: Secondary | ICD-10-CM | POA: Diagnosis not present

## 2014-07-13 DIAGNOSIS — L03031 Cellulitis of right toe: Secondary | ICD-10-CM | POA: Diagnosis not present

## 2014-07-15 ENCOUNTER — Ambulatory Visit: Payer: Medicare Other | Admitting: Neurology

## 2014-07-19 ENCOUNTER — Telehealth: Payer: Self-pay | Admitting: Internal Medicine

## 2014-07-19 NOTE — Telephone Encounter (Signed)
Patient will have medical records released to [facility] [employer] in order to request extension on electric bill deadline , due to medical issues, patient uses cpap machine. Patient wants to make sure CPAP was discussed during last office visit. Please f/u with confirmation.

## 2014-07-27 DIAGNOSIS — T8189XD Other complications of procedures, not elsewhere classified, subsequent encounter: Secondary | ICD-10-CM | POA: Diagnosis not present

## 2014-07-27 DIAGNOSIS — T8189XA Other complications of procedures, not elsewhere classified, initial encounter: Secondary | ICD-10-CM | POA: Diagnosis not present

## 2014-07-29 ENCOUNTER — Ambulatory Visit: Payer: Medicare Other | Admitting: Neurology

## 2014-08-03 DIAGNOSIS — T8189XD Other complications of procedures, not elsewhere classified, subsequent encounter: Secondary | ICD-10-CM | POA: Diagnosis not present

## 2014-08-04 ENCOUNTER — Telehealth: Payer: Self-pay | Admitting: Internal Medicine

## 2014-08-04 NOTE — Telephone Encounter (Signed)
SEE NOTE

## 2014-08-04 NOTE — Telephone Encounter (Signed)
Patient's wife called today wanting to be referred out to [facility], patient was scheduled PCP visit to f/u.

## 2014-08-10 ENCOUNTER — Ambulatory Visit: Payer: Medicare Other | Admitting: Cardiovascular Disease

## 2014-08-12 ENCOUNTER — Ambulatory Visit: Payer: Medicare Other | Admitting: Internal Medicine

## 2014-08-12 ENCOUNTER — Encounter: Payer: Self-pay | Admitting: Internal Medicine

## 2014-08-12 ENCOUNTER — Ambulatory Visit: Payer: Medicare Other | Attending: Internal Medicine | Admitting: Internal Medicine

## 2014-08-12 VITALS — BP 119/77 | HR 78 | Temp 98.3°F | Ht 71.0 in | Wt 263.0 lb

## 2014-08-12 DIAGNOSIS — E1129 Type 2 diabetes mellitus with other diabetic kidney complication: Secondary | ICD-10-CM | POA: Insufficient documentation

## 2014-08-12 DIAGNOSIS — N183 Chronic kidney disease, stage 3 unspecified: Secondary | ICD-10-CM

## 2014-08-12 DIAGNOSIS — F419 Anxiety disorder, unspecified: Secondary | ICD-10-CM | POA: Diagnosis not present

## 2014-08-12 DIAGNOSIS — K0889 Other specified disorders of teeth and supporting structures: Secondary | ICD-10-CM

## 2014-08-12 DIAGNOSIS — K088 Other specified disorders of teeth and supporting structures: Secondary | ICD-10-CM | POA: Insufficient documentation

## 2014-08-12 DIAGNOSIS — E785 Hyperlipidemia, unspecified: Secondary | ICD-10-CM | POA: Diagnosis not present

## 2014-08-12 DIAGNOSIS — I10 Essential (primary) hypertension: Secondary | ICD-10-CM | POA: Insufficient documentation

## 2014-08-12 DIAGNOSIS — E1165 Type 2 diabetes mellitus with hyperglycemia: Secondary | ICD-10-CM | POA: Insufficient documentation

## 2014-08-12 DIAGNOSIS — Z794 Long term (current) use of insulin: Secondary | ICD-10-CM | POA: Insufficient documentation

## 2014-08-12 DIAGNOSIS — I6930 Unspecified sequelae of cerebral infarction: Secondary | ICD-10-CM | POA: Diagnosis not present

## 2014-08-12 DIAGNOSIS — E1140 Type 2 diabetes mellitus with diabetic neuropathy, unspecified: Secondary | ICD-10-CM | POA: Insufficient documentation

## 2014-08-12 DIAGNOSIS — IMO0002 Reserved for concepts with insufficient information to code with codable children: Secondary | ICD-10-CM

## 2014-08-12 DIAGNOSIS — E1122 Type 2 diabetes mellitus with diabetic chronic kidney disease: Secondary | ICD-10-CM | POA: Insufficient documentation

## 2014-08-12 DIAGNOSIS — E1142 Type 2 diabetes mellitus with diabetic polyneuropathy: Secondary | ICD-10-CM | POA: Insufficient documentation

## 2014-08-12 DIAGNOSIS — E119 Type 2 diabetes mellitus without complications: Secondary | ICD-10-CM | POA: Diagnosis not present

## 2014-08-12 DIAGNOSIS — E1169 Type 2 diabetes mellitus with other specified complication: Secondary | ICD-10-CM | POA: Insufficient documentation

## 2014-08-12 HISTORY — DX: Essential (primary) hypertension: I10

## 2014-08-12 LAB — POCT URINALYSIS DIPSTICK
Bilirubin, UA: NEGATIVE
Blood, UA: NEGATIVE
GLUCOSE UA: 500
KETONES UA: NEGATIVE
Leukocytes, UA: NEGATIVE
Nitrite, UA: NEGATIVE
Protein, UA: NEGATIVE
SPEC GRAV UA: 1.01
Urobilinogen, UA: 0.2
pH, UA: 5.5

## 2014-08-12 LAB — GLUCOSE, POCT (MANUAL RESULT ENTRY)
POC GLUCOSE: 380 mg/dL — AB (ref 70–99)
POC GLUCOSE: 375 mg/dL — AB (ref 70–99)

## 2014-08-12 MED ORDER — INSULIN DETEMIR 100 UNIT/ML ~~LOC~~ SOLN: 48.0000 [IU] | Freq: Every day | SUBCUTANEOUS | Status: DC

## 2014-08-12 MED ORDER — GLUCOSE BLOOD VI STRP: ORAL_STRIP | Status: DC

## 2014-08-12 MED ORDER — BUSPIRONE HCL 10 MG PO TABS: 10.0000 mg | ORAL_TABLET | Freq: Two times a day (BID) | ORAL | Status: DC

## 2014-08-12 MED ORDER — CLONIDINE HCL 0.2 MG PO TABS
0.2000 mg | ORAL_TABLET | Freq: Three times a day (TID) | ORAL | Status: DC
Start: 2014-08-12 — End: 2015-02-10

## 2014-08-12 MED ORDER — GLIMEPIRIDE 4 MG PO TABS: 4.0000 mg | ORAL_TABLET | Freq: Two times a day (BID) | ORAL | Status: DC

## 2014-08-12 MED ORDER — ATORVASTATIN CALCIUM 40 MG PO TABS
40.0000 mg | ORAL_TABLET | Freq: Every day | ORAL | Status: DC
Start: 2014-08-12 — End: 2021-11-17

## 2014-08-12 MED ORDER — INSULIN ASPART 100 UNIT/ML ~~LOC~~ SOLN: 28.0000 [IU] | Freq: Three times a day (TID) | SUBCUTANEOUS | Status: DC

## 2014-08-12 MED ORDER — CARVEDILOL 25 MG PO TABS
50.0000 mg | ORAL_TABLET | Freq: Two times a day (BID) | ORAL | Status: DC
Start: 2014-08-12 — End: 2021-11-17

## 2014-08-12 MED ORDER — AMLODIPINE BESYLATE 10 MG PO TABS: 10.0000 mg | ORAL_TABLET | Freq: Every day | ORAL | Status: DC

## 2014-08-12 MED ORDER — HYDRALAZINE HCL 50 MG PO TABS: 50.0000 mg | ORAL_TABLET | Freq: Three times a day (TID) | ORAL | Status: DC

## 2014-08-12 NOTE — Progress Notes (Signed)
Patient here for diabetes Patient wife would like PCP to check to make sure his foot is healing appropriately after surgery She is having trouble with medication refills and wants to know how we can fix it She would like PCP to Rx lasix for patient

## 2014-08-12 NOTE — Progress Notes (Signed)
Patient ID: Justin Molina, male   DOB: 20-May-1960, 54 y.o.   MRN: [id number]  CC: f/u, referrals   HPI: Justin Molina is a 54 y.o. male here today for a follow up visit.  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 Patient has global aphasia and is present with his wife who is giving history. She reports that since our last visit the patient was approved for Medicaid and has been able to see podiatry for left great toenail removal 5 weeks ago. He was seen by [facility] and has been placed on 2 different antibiotics. She is concerned about his healing. She would like a refill of all his medications because his insurance has denied previous scripts.  Patient is due to follow up with Cardiology on 7/21. She states that he was under the care of a Cardiologist while in [place] and was scheduled to have stent placement but due to CKD he was placed on hold. She is concerned about him not having lasix but she is unable to explain why he was on the medication in the past.  She would like a referral to Nephrology to follow up on patients CKD.   Patient has No headache, No chest pain, No abdominal pain - No Nausea, No new weakness tingling or numbness, No Cough - SOB.  Allergies  Allergen Reactions  . Contrast Media [Iodinated Diagnostic Agents] Nausea And Vomiting and Swelling   Past Medical History  Diagnosis Date  . Diabetes mellitus without complication   . Stroke   . Hypertension    Current Outpatient Prescriptions on File Prior to Visit  Medication Sig Dispense Refill  . amLODipine (NORVASC) 10 MG tablet Take 1 tablet (10 mg total) by mouth daily. 30 tablet 4  . atorvastatin (LIPITOR) 40 MG tablet Take 1 tablet (40 mg total) by mouth at bedtime. 30 tablet 4  . carvedilol (COREG) 25 MG tablet Take 2 tablets (50 mg total) by mouth 2 (two) times daily with a meal. 60 tablet 4  . cloNIDine (CATAPRES) 0.2 MG  tablet Take 1 tablet (0.2 mg total) by mouth 3 (three) times daily. 30 tablet 4  . fluticasone (FLONASE) 50 MCG/ACT nasal spray Place 1 spray into both nostrils daily.    Marland Kitchen glimepiride (AMARYL) 4 MG tablet Take 1 tablet (4 mg total) by mouth 2 (two) times daily. 60 tablet 4  . hydrALAZINE (APRESOLINE) 50 MG tablet Take 1 tablet (50 mg total) by mouth 3 (three) times daily. 90 tablet 4  . insulin aspart (NOVOLOG) 100 UNIT/ML injection Inject 28-32 Units into the skin 3 (three) times daily before meals. 10 mL 6  . insulin detemir (LEVEMIR) 100 UNIT/ML injection Inject 0.48 mLs (48 Units total) into the skin at bedtime. 10 mL 5  . acetaminophen-codeine (TYLENOL #3) 300-30 MG per tablet Take 1 tablet by mouth every 8 (eight) hours as needed for moderate pain. (Patient not taking: Reported on 08/12/2014) 30 tablet 0  . furosemide (LASIX) 20 MG tablet Take 1 tablet (20 mg total) by mouth 2 (two) times daily. (Patient not taking: Reported on 08/12/2014) 60 tablet \  . HYDROcodone-acetaminophen (NORCO) 5-325 MG per tablet Take 1 tablet by mouth every 6 (six) hours as needed for severe pain. (Patient not taking: Reported on 06/11/2014) 20 tablet 0  . oxyCODONE-acetaminophen (PERCOCET) 7.5-325 MG per tablet Take 1 tablet by mouth every 4 (four) hours as needed for severe pain. (Patient not  taking: Reported on 08/12/2014) 12 tablet 0  . penicillin v potassium (VEETID) 500 MG tablet Take 2 tablets (1,000 mg total) by mouth 2 (two) times daily. X 7 days (Patient not taking: Reported on 08/12/2014) 28 tablet 0   No current facility-administered medications on file prior to visit.   No family history on file. History   Social History  . Marital Status: Married    Spouse Name: N/A  . Number of Children: N/A  . Years of Education: N/A   Occupational History  . Not on file.   Social History Main Topics  . Smoking status: Never Smoker   . Smokeless tobacco: Not on file  . Alcohol Use: No  . Drug Use: No  . Sexual  Activity: Not on file   Other Topics Concern  . Not on file   Social History Narrative    Review of Systems: Unable to get a detail ROS due to global aphasia    Objective:   Filed Vitals:   08/12/14 1734  BP: 119/77  Pulse: 78  Temp: 98.3 F (36.8 C)    Physical Exam  Cardiovascular: Normal rate, regular rhythm and normal heart sounds.   Pulmonary/Chest: Effort normal and breath sounds normal.  Abdominal: Soft. Bowel sounds are normal.  Musculoskeletal: He exhibits no edema or tenderness.  Right great toe healing open sores from nail removal   Skin: Skin is warm and dry.     Lab Results  Component Value Date   WBC 4.2 06/06/2014   HGB 13.9 06/06/2014   HCT 40.1 06/06/2014   MCV 82.0 06/06/2014   PLT 195 06/06/2014   Lab Results  Component Value Date   CREATININE 1.47* 06/06/2014   BUN 15 06/06/2014   NA 137 06/06/2014   K 3.3* 06/06/2014   CL 105 06/06/2014   CO2 27 06/06/2014    Lab Results  Component Value Date   HGBA1C 9.90 06/11/2014   Lipid Panel  No results found for: CHOL, TRIG, HDL, CHOLHDL, VLDL, LDLCALC     Assessment and plan:   Justin Molina was seen today for dental referral and diabetes.  Diagnoses and all orders for this visit:  DM type 2, uncontrolled, with renal complications Orders: -     Glucose (CBG) -     Microalbumin, urine -     Urinalysis Dipstick -     Glucose (CBG) -     glucose blood (TRUETEST TEST) test strip; Check 4 times per day for E11.29 I have encourage wife to check patients sugars and record 3 times per day for me to review medication regimen. He will return in 2-3 weeks for a cbg log review. Addressed diet, exercise, foot care, and long term diabetes complications   CKD (chronic kidney disease) stage 3, GFR 30-59 ml/min Orders: -     Ambulatory referral to Nephrology Likely a result of uncontrolled DM. Will appreciate early Nephrology consult  Essential hypertension Orders: -     amLODipine (NORVASC) 10 MG  tablet; Take 1 tablet (10 mg total) by mouth daily. -     carvedilol (COREG) 25 MG tablet; Take 2 tablets (50 mg total) by mouth 2 (two) times daily with a meal. -     cloNIDine (CATAPRES) 0.2 MG tablet; Take 1 tablet (0.2 mg total) by mouth 3 (three) times daily. -     glimepiride (AMARYL) 4 MG tablet; Take 1 tablet (4 mg total) by mouth 2 (two) times daily. -     hydrALAZINE (  APRESOLINE) 50 MG tablet; Take 1 tablet (50 mg total) by mouth 3 (three) times daily. Refilled medications. Patient blood pressure is stable and may continue on current medication.  Education on diet, exercise, and modifiable risk factors discussed. Will obtain appropriate labs as needed. Will follow up in 3-6 months.   HLD (hyperlipidemia) Orders: -     Refilled atorvastatin (LIPITOR) 40 MG tablet; Take 1 tablet (40 mg total) by mouth at bedtime. Education provided on proper lifestyle changes in order to lower cholesterol. Patient advised to maintain healthy weight and to keep total fat intake at 25-35% of total calories and carbohydrates 50-60% of total daily calories. Explained how high cholesterol places patient at risk for heart disease. Patient placed on appropriate medication and repeat labs in 6 months   Pain, dental Orders: -     Ambulatory referral to Dentistry. Will need dental referral asap due to patient medical history.   Diabetic polyneuropathy associated with type 2 diabetes mellitus Will attempt to control DM first to see if he has any improvement in neuropathy. If not may place patient on very low dose Neurotin later.   History of CVA with residual deficit Strict chronic disease control. Continue lipitor and aspirin. Not sure why patient is not on Plavix. Will refer patient to Neurology for evaluation.   Anxiety Orders: -    Begin busPIRone (BUSPAR) 10 MG tablet; Take 1 tablet (10 mg total) by mouth 2 (two) times daily. For anxiety Past records reveal that patient was on Buspar in the past which  helped his anxiety. Will place him back on today. Wife states that he gets worked up easily and he appears depressed.    Return in about 2 weeks (around 08/26/2014) for RN visit-log review and 3 mo PCP.        Ambrose Finland, NP-C [facility] [phone] 08/12/2014, 5:45 PM dd

## 2014-08-13 ENCOUNTER — Telehealth: Payer: Self-pay | Admitting: *Deleted

## 2014-08-13 LAB — MICROALBUMIN, URINE: Microalb, Ur: 0.6 mg/dL (ref <2.0)

## 2014-08-13 NOTE — Telephone Encounter (Signed)
Left message for patient's wife updating her that Justin Molina is to fax the certificate of medical necessity form.  The pharmacy employee told me the form has to be faxed to us by their corporate office and she was unable to tell me how long it would take for the fax to arrive.  I let her know that as soon as I receive the fax we would complete all information and fax it back.  The pharmacy also said that the other problem was that the patient's medicare still shows them living in [place] and that she would need to call medicare and update their address.

## 2014-08-16 ENCOUNTER — Telehealth: Payer: Self-pay | Admitting: Internal Medicine

## 2014-08-16 NOTE — Telephone Encounter (Signed)
    Expand All Collapse All   Left message for patient's wife updating her [facility]. The pharmacy employee told me the form has to be faxed to us by their corporate office and she was unable to tell me how long it would take for the fax to arrive. I let her know that as soon as I receive the fax we would complete all information and fax it back. The pharmacy also said that the other problem was that the patient's medicare still shows them living in [place] and that she would need to call medicare and update their address.       Please follow up with patient

## 2014-08-16 NOTE — Telephone Encounter (Signed)
Nurse spoke with patients wife. Wife is concerned about patients diabetic testing supplies. Nurse explained to wife that [facility] is [facility]. Nurse will call tomorrow to investigate situation with [facility].  Nurse informed wife of need to call Medicare and change address. Wife has done this 2 times but agrees to call again.

## 2014-08-17 ENCOUNTER — Other Ambulatory Visit: Payer: Self-pay | Admitting: Internal Medicine

## 2014-08-17 ENCOUNTER — Telehealth: Payer: Self-pay

## 2014-08-17 MED ORDER — LEVEMIR FLEXTOUCH 100 UNIT/ML ~~LOC~~ SOPN: 48.0000 [IU] | PEN_INJECTOR | Freq: Every day | SUBCUTANEOUS | Status: DC

## 2014-08-17 MED ORDER — NOVOLOG FLEXPEN 100 UNIT/ML ~~LOC~~ SOPN: 28.0000 [IU] | PEN_INJECTOR | Freq: Three times a day (TID) | SUBCUTANEOUS | Status: DC

## 2014-08-17 NOTE — Telephone Encounter (Signed)
Nurse called pharmacy to request form to be faxed to the attention of [facility] Donn Zanetti, RN .  Pharmacy advised nurse that request had been sent for their office [facility]. Nurse informed pharmacy that form had not been received and requested pharmacy to send another request because patient is not able to test glucose currently without supplies. Pharmacy agrees to res[facility] to [facility] to [place].

## 2014-08-17 NOTE — Telephone Encounter (Signed)
Nurse called patient at both numbers provided on chart. Reached voice mail at both numbers. Unable to leave message on either voice mail, one voicemail was full and the other wasn't set up.

## 2014-08-17 NOTE — Telephone Encounter (Signed)
Patients wife called nurse, verified patients date of birth.  Wife is aware of both insulins sent to pharmacy in flex pen form.  Patients wife voices understanding and has no further questions at this time.

## 2014-08-17 NOTE — Telephone Encounter (Signed)
Nurse called patient, reached patient's wife. Patient's date of birth verified. Wife aware of nurses call to pharmacy. Pharmacy to send form to attention of nurse.  Wife requesting insulin prescriptions to be sent in flex pen form.

## 2014-08-24 ENCOUNTER — Encounter: Payer: Self-pay | Admitting: Internal Medicine

## 2014-08-24 DIAGNOSIS — E785 Hyperlipidemia, unspecified: Secondary | ICD-10-CM | POA: Insufficient documentation

## 2014-08-24 DIAGNOSIS — N183 Chronic kidney disease, stage 3 unspecified: Secondary | ICD-10-CM | POA: Insufficient documentation

## 2014-08-24 DIAGNOSIS — F419 Anxiety disorder, unspecified: Secondary | ICD-10-CM | POA: Insufficient documentation

## 2014-08-24 NOTE — Telephone Encounter (Signed)
Patient's wife called to give fax number for a referral to [facility], the fax number is [phone].

## 2014-08-26 ENCOUNTER — Encounter: Payer: Self-pay | Admitting: Cardiovascular Disease

## 2014-08-26 ENCOUNTER — Ambulatory Visit (INDEPENDENT_AMBULATORY_CARE_PROVIDER_SITE_OTHER): Payer: Medicare Other | Admitting: Cardiovascular Disease

## 2014-08-26 VITALS — BP 140/82 | HR 70 | Ht 71.0 in | Wt 266.6 lb

## 2014-08-26 DIAGNOSIS — Z79899 Other long term (current) drug therapy: Secondary | ICD-10-CM | POA: Diagnosis not present

## 2014-08-26 DIAGNOSIS — E785 Hyperlipidemia, unspecified: Secondary | ICD-10-CM

## 2014-08-26 DIAGNOSIS — I10 Essential (primary) hypertension: Secondary | ICD-10-CM | POA: Diagnosis not present

## 2014-08-26 NOTE — Progress Notes (Signed)
08/26/2014 Justin Molina   [birth year]  [id number]  Primary Physician Justin Finland, NP Primary Cardiologist: Justin Gess MD Justin Molina   HPI:  [name] is a 54 year old moderately overweight married [place] male father of 67 children, grandfather of 12 grandchildren referred by Dr. Luna Molina for cardiovascular evaluation and be establishing our practice. He recently relocated from [facility] with his wife. He is disabled because of prior stroke occurred 10/09/07. His other problems included hypertension, hyperlipidemia and diabetes. He does have chronic renal insufficiency probably related to his diabetes and hypertension. He has never had a heart attack. He denies chest pain or shortness of breath. He is a phasic and paralyzed on the right side. Ambulates with a cane.   Current Outpatient Prescriptions  Medication Sig Dispense Refill  . amLODipine (NORVASC) 10 MG tablet Take 1 tablet (10 mg total) by mouth daily. 30 tablet 4  . atorvastatin (LIPITOR) 40 MG tablet Take 1 tablet (40 mg total) by mouth at bedtime. 30 tablet 4  . busPIRone (BUSPAR) 10 MG tablet Take 1 tablet (10 mg total) by mouth 2 (two) times daily. For anxiety 60 tablet 4  . carvedilol (COREG) 25 MG tablet Take 2 tablets (50 mg total) by mouth 2 (two) times daily with a meal. 60 tablet 4  . cloNIDine (CATAPRES) 0.2 MG tablet Take 1 tablet (0.2 mg total) by mouth 3 (three) times daily. 30 tablet 4  . fluticasone (FLONASE) 50 MCG/ACT nasal spray Place 1 spray into both nostrils daily.    . furosemide (LASIX) 20 MG tablet Take 1 tablet (20 mg total) by mouth 2 (two) times daily. 60 tablet \  . glimepiride (AMARYL) 4 MG tablet Take 1 tablet (4 mg total) by mouth 2 (two) times daily. 60 tablet 4  . glucose blood (TRUETEST TEST) test strip Check 4 times per day for E11.29 100 each 12  . hydrALAZINE (APRESOLINE) 50 MG tablet Take 1 tablet (50 mg total) by mouth 3 (three) times daily. 90 tablet 4  .  LEVEMIR FLEXTOUCH 100 UNIT/ML Pen Inject 48 Units into the skin daily at 10 pm. 15 mL 11  . NOVOLOG FLEXPEN 100 UNIT/ML FlexPen Inject 28-32 Units into the skin 3 (three) times daily with meals. 15 mL 11   No current facility-administered medications for this visit.    Allergies  Allergen Reactions  . Contrast Media [Iodinated Diagnostic Agents] Nausea And Vomiting and Swelling    History   Social History  . Marital Status: Married    Spouse Name: N/A  . Number of Children: N/A  . Years of Education: N/A   Occupational History  . Not on file.   Social History Main Topics  . Smoking status: Never Smoker   . Smokeless tobacco: Not on file  . Alcohol Use: No  . Drug Use: No  . Sexual Activity: Not on file   Other Topics Concern  . Not on file   Social History Narrative     Review of Systems: General: negative for chills, fever, night sweats or weight changes.  Cardiovascular: negative for chest pain, dyspnea on exertion, edema, orthopnea, palpitations, paroxysmal nocturnal dyspnea or shortness of breath Dermatological: negative for rash Respiratory: negative for cough or wheezing Urologic: negative for hematuria Abdominal: negative for nausea, vomiting, diarrhea, bright red blood per rectum, melena, or hematemesis Neurologic: negative for visual changes, syncope, or dizziness All other systems reviewed and are otherwise negative except as noted above.  Blood pressure 140/82, pulse 70, height  (1.803 m), weight 266 lb 9.6 oz (120.929 kg).  General appearance: alert and no distress Neck: no adenopathy, no carotid bruit, no JVD, supple, symmetrical, trachea midline and thyroid not enlarged, symmetric, no tenderness/mass/nodules Lungs: clear to auscultation bilaterally Heart: regular rate and rhythm, S1, S2 normal, no murmur, click, rub or gallop Extremities: extremities normal, atraumatic, no cyanosis or edema  EKG normal sinus rhythm at 70 with left anterior  fascicular block  And poor R-wave with progression with borderline LVH. I personally reviewed this EKG  ASSESSMENT AND PLAN:   HTN (hypertension) Hypertension blood pressure measured at 140/82. He is on amlodipine, carvedilol, clonidine and hydralazine. Continue current meds at current dosing  HLD (hyperlipidemia) History of hyperlipidemia on atorvastatin 40 mg a day. We will check a lipid and liver profile      Justin Gess MD [facility], [facility] 08/26/2014 11:04 AM

## 2014-08-26 NOTE — Assessment & Plan Note (Signed)
History of hyperlipidemia on atorvastatin 40 mg a day. We will check a lipid and liver profile 

## 2014-08-26 NOTE — Patient Instructions (Addendum)
Your physician recommends that you return for lab work FASTING - nothing to eat/drink after midnight  We request that you follow-up in: 6 months with an extender and in 1 year with Dr San Morelle will receive a reminder letter in the mail two months in advance. If you don't receive a letter, please call our office to schedule the follow-up appointment.

## 2014-08-26 NOTE — Assessment & Plan Note (Signed)
Hypertension blood pressure measured at 140/82. He is on amlodipine, carvedilol, clonidine and hydralazine. Continue current meds at current dosing

## 2014-08-27 ENCOUNTER — Emergency Department (HOSPITAL_COMMUNITY)
Admission: EM | Admit: 2014-08-27 | Discharge: 2014-08-28 | Disposition: A | Discharge status: Home / Self Care | Payer: Medicare Other | Attending: Emergency Medicine | Admitting: Emergency Medicine

## 2014-08-27 ENCOUNTER — Encounter (HOSPITAL_COMMUNITY): Payer: Self-pay | Admitting: Emergency Medicine

## 2014-08-27 ENCOUNTER — Emergency Department (HOSPITAL_COMMUNITY): Payer: Medicare Other

## 2014-08-27 DIAGNOSIS — Z8673 Personal history of transient ischemic attack (TIA), and cerebral infarction without residual deficits: Secondary | ICD-10-CM | POA: Insufficient documentation

## 2014-08-27 DIAGNOSIS — N189 Chronic kidney disease, unspecified: Secondary | ICD-10-CM | POA: Insufficient documentation

## 2014-08-27 DIAGNOSIS — E161 Other hypoglycemia: Secondary | ICD-10-CM | POA: Diagnosis not present

## 2014-08-27 DIAGNOSIS — I129 Hypertensive chronic kidney disease with stage 1 through stage 4 chronic kidney disease, or unspecified chronic kidney disease: Secondary | ICD-10-CM | POA: Diagnosis not present

## 2014-08-27 DIAGNOSIS — Z79899 Other long term (current) drug therapy: Secondary | ICD-10-CM | POA: Insufficient documentation

## 2014-08-27 DIAGNOSIS — R7309 Other abnormal glucose: Secondary | ICD-10-CM | POA: Diagnosis not present

## 2014-08-27 DIAGNOSIS — E162 Hypoglycemia, unspecified: Secondary | ICD-10-CM

## 2014-08-27 DIAGNOSIS — G9389 Other specified disorders of brain: Secondary | ICD-10-CM | POA: Diagnosis not present

## 2014-08-27 DIAGNOSIS — E11649 Type 2 diabetes mellitus with hypoglycemia without coma: Secondary | ICD-10-CM | POA: Insufficient documentation

## 2014-08-27 DIAGNOSIS — R531 Weakness: Secondary | ICD-10-CM | POA: Diagnosis not present

## 2014-08-27 LAB — CBC WITH DIFFERENTIAL/PLATELET
BASOS ABS: 0 10*3/uL (ref 0.0–0.1)
Basophils Relative: 0 % (ref 0–1)
EOS ABS: 0 10*3/uL (ref 0.0–0.7)
EOS PCT: 0 % (ref 0–5)
HEMATOCRIT: 46.4 % (ref 39.0–52.0)
Hemoglobin: 16.5 g/dL (ref 13.0–17.0)
LYMPHS PCT: 14 % (ref 12–46)
Lymphs Abs: 1.4 10*3/uL (ref 0.7–4.0)
MCH: 28.8 pg (ref 26.0–34.0)
MCHC: 35.6 g/dL (ref 30.0–36.0)
MCV: 81 fL (ref 78.0–100.0)
Monocytes Absolute: 0.5 10*3/uL (ref 0.1–1.0)
Monocytes Relative: 5 % (ref 3–12)
NEUTROS PCT: 81 % — AB (ref 43–77)
Neutro Abs: 8.1 10*3/uL — ABNORMAL HIGH (ref 1.7–7.7)
Platelets: 202 10*3/uL (ref 150–400)
RBC: 5.73 MIL/uL (ref 4.22–5.81)
RDW: 13.5 % (ref 11.5–15.5)
WBC: 10 10*3/uL (ref 4.0–10.5)

## 2014-08-27 LAB — CBG MONITORING, ED
Glucose-Capillary: 101 mg/dL — ABNORMAL HIGH (ref 65–99)
Glucose-Capillary: 110 mg/dL — ABNORMAL HIGH (ref 65–99)

## 2014-08-27 LAB — I-STAT CHEM 8, ED
BUN: 18 mg/dL (ref 6–20)
CALCIUM ION: 1.11 mmol/L — AB (ref 1.12–1.23)
CREATININE: 1.2 mg/dL (ref 0.61–1.24)
Chloride: 104 mmol/L (ref 101–111)
Glucose, Bld: 124 mg/dL — ABNORMAL HIGH (ref 65–99)
HEMATOCRIT: 52 % (ref 39.0–52.0)
HEMOGLOBIN: 17.7 g/dL — AB (ref 13.0–17.0)
Potassium: 4.4 mmol/L (ref 3.5–5.1)
Sodium: 143 mmol/L (ref 135–145)
TCO2: 27 mmol/L (ref 0–100)

## 2014-08-27 LAB — I-STAT TROPONIN, ED: Troponin i, poc: 0 ng/mL (ref 0.00–0.08)

## 2014-08-27 NOTE — ED Provider Notes (Addendum)
CSN: [id number]     Arrival date & time 08/27/14  1942 History   First MD Initiated Contact with Patient 08/27/14 2039     Chief Complaint  Patient presents with  . Hypoglycemia     (Consider location/radiation/quality/duration/timing/severity/associated sxs/prior Treatment) HPI   54 year old male with hx of stroke with residual R side weakness who is brought here via EMS from home for evaluation of hypoglycemia. History obtained through wife who is at bedside. Per wife, patient not eat his entire meal this afternoon but did take his usual insulin. Patient was found on the ground next to his bed approximately 3 hours ago. It appears that he is trying to get up but unable to. Wife states he has a grimace on his face, and appears diaphoretic. His cane was found on the ground. Unsure if patient has fallen. EMS was contacted and patient was found to have CBG of 29. Patient received D5W which has since improved his blood sugar. Per wife, patient appears to be at his baseline at this time. No recent sickness. When asked if patient has headache, neck pain, chest pain, difficulty breathing, abdominal pain, back pain, dysuria, new pain and numbness patient shakes his head and denies any symptoms.   Past Medical History  Diagnosis Date  . Diabetes mellitus without complication   . Stroke   . Hypertension   . Chronic renal insufficiency    History reviewed. No pertinent past surgical history. No family history on file. History  Substance Use Topics  . Smoking status: Never Smoker   . Smokeless tobacco: Not on file  . Alcohol Use: No    Review of Systems  All other systems reviewed and are negative.     Allergies  Contrast media  Home Medications   Prior to Admission medications   Medication Sig Start Date End Date Taking? Authorizing Provider  amLODipine (NORVASC) 10 MG tablet Take 1 tablet (10 mg total) by mouth daily. 08/12/14  Yes Ambrose Finland, NP  atorvastatin (LIPITOR) 40 MG  tablet Take 1 tablet (40 mg total) by mouth at bedtime. 08/12/14  Yes Ambrose Finland, NP  busPIRone (BUSPAR) 10 MG tablet Take 1 tablet (10 mg total) by mouth 2 (two) times daily. For anxiety Patient taking differently: Take 10 mg by mouth 2 (two) times daily as needed. For anxiety 08/12/14  Yes Ambrose Finland, NP  carvedilol (COREG) 25 MG tablet Take 2 tablets (50 mg total) by mouth 2 (two) times daily with a meal. 08/12/14  Yes Ambrose Finland, NP  cloNIDine (CATAPRES) 0.2 MG tablet Take 1 tablet (0.2 mg total) by mouth 3 (three) times daily. 08/12/14  Yes Ambrose Finland, NP  fluticasone (FLONASE) 50 MCG/ACT nasal spray Place 1 spray into both nostrils daily as needed for allergies.    Yes Historical Provider, MD  glimepiride (AMARYL) 4 MG tablet Take 1 tablet (4 mg total) by mouth 2 (two) times daily. 08/12/14  Yes Ambrose Finland, NP  glucose blood (TRUETEST TEST) test strip Check 4 times per day for E11.29 08/12/14  Yes Ambrose Finland, NP  hydrALAZINE (APRESOLINE) 50 MG tablet Take 1 tablet (50 mg total) by mouth 3 (three) times daily. 08/12/14  Yes Ambrose Finland, NP  LEVEMIR FLEXTOUCH 100 UNIT/ML Pen Inject 48 Units into the skin daily at 10 pm. 08/17/14  Yes Olugbemiga E Hyman Hopes, MD  NOVOLOG FLEXPEN 100 UNIT/ML FlexPen Inject 28-32 Units into the skin 3 (three) times daily with  meals. 08/17/14  Yes Quentin Angst, MD  furosemide (LASIX) 20 MG tablet Take 1 tablet (20 mg total) by mouth 2 (two) times daily. Patient not taking: Reported on 08/27/2014 06/11/14   Ambrose Finland, NP   BP 139/89 mmHg  Pulse 58  Resp 20  SpO2 100% Physical Exam  Constitutional: He appears well-developed and well-nourished. No distress.  African-American male, laying in bed, appears to be in no acute distress, smiling and cheerful.  HENT:  Head: Atraumatic.  Eyes: Conjunctivae and EOM are normal. Pupils are equal, round, and reactive to light.  Neck: Normal range of motion. Neck supple.  Cardiovascular: Normal rate and  regular rhythm.   Pulmonary/Chest: Effort normal and breath sounds normal.  Abdominal: Soft. There is no tenderness.  Musculoskeletal: He exhibits no edema or tenderness.  Neurological: He is alert. A sensory deficit (Decreased sensation and strength to right upper and lower extremities) is present. No cranial nerve deficit. GCS eye subscore is 4. GCS verbal subscore is 5. GCS motor subscore is 6.  Skin: No rash noted.  Psychiatric: He has a normal mood and affect.  Nursing note and vitals reviewed.   ED Course  Procedures (including critical care time)  10:02 PM Patient here for an unwitnessed fall, and having hypoglycemia. Workup initiated.  12:38 AM No documented hypoglycemia in the ER. Labs are reassuring. No signs of infection. Head CT scan without acute finding. Chest x-ray is normal. EKG is reassuring. Patient is at his baseline, appears to be in no acute distress. Patient has been monitored for the past several hours without any major changes. He is stable for discharge with outpatient follow-up with PCP as needed. Return precautions discussed. Recommend patient to eat appropriately while taking insulin.    Labs Review Labs Reviewed  CBC WITH DIFFERENTIAL/PLATELET - Abnormal; Notable for the following:    Neutrophils Relative % 81 (*)    Neutro Abs 8.1 (*)    All other components within normal limits  CBG MONITORING, ED - Abnormal; Notable for the following:    Glucose-Capillary 101 (*)    All other components within normal limits  I-STAT CHEM 8, ED - Abnormal; Notable for the following:    Glucose, Bld 124 (*)    Calcium, Ion 1.11 (*)    Hemoglobin 17.7 (*)    All other components within normal limits  CBG MONITORING, ED - Abnormal; Notable for the following:    Glucose-Capillary 110 (*)    All other components within normal limits  URINALYSIS, ROUTINE W REFLEX MICROSCOPIC (NOT AT [facility])  Rosezena Sensor, ED    Imaging Review Dg Chest 2 View  08/27/2014   CLINICAL  DATA:  54 year old male with weakness  EXAM: CHEST  2 VIEW  COMPARISON:  None.  FINDINGS: Two views of the chest demonstrate clear lungs. No pleural effusion or pneumothorax. Moderate cardiomegaly. The osseous structures are grossly unremarkable.  IMPRESSION: No acute cardiopulmonary process.  Cardiomegaly.   Electronically Signed   By: Elgie Collard M.D.   On: 08/27/2014 22:42   Ct Head Wo Contrast  08/27/2014   CLINICAL DATA:  Acute onset of hypoglycemia. Generalized weakness. Initial encounter.  EXAM: CT HEAD WITHOUT CONTRAST  TECHNIQUE: Contiguous axial images were obtained from the base of the skull through the vertex without intravenous contrast.  COMPARISON:  Maxillofacial CT performed 06/06/2014  FINDINGS: There is no evidence of acute infarction, mass lesion, or intra- or extra-axial hemorrhage on CT.  There is left-sided postoperative change, with  diffuse underlying encephalomalacia involving the left frontal and parietal lobes, and ex vacuo dilatation of the left lateral ventricle. An overlying craniotomy flap is noted, with mild chronic separation of the flap posteriorly. Postoperative change involves the left basal ganglia and portions of the left temporal lobe.  Scattered periventricular white matter change likely reflects small vessel ischemic microangiopathy.  The brainstem and fourth ventricle are within normal limits. The basal ganglia are unremarkable in appearance. The cerebral hemispheres demonstrate grossly normal gray-white differentiation. No mass effect or midline shift is seen.  There is no evidence of fracture. The orbits are within normal limits. The paranasal sinuses and mastoid air cells are well-aerated. No significant soft tissue abnormalities are seen.  IMPRESSION: 1. No acute intracranial pathology seen on CT. 2. Left-sided postoperative change, with diffuse underlying encephalomalacia involving the left frontal and parietal lobes, and ex vacuo dilatation of the left lateral  ventricle. Overlying craniotomy flap noted, with mild chronic separation of the posterior aspect of the flap. 3. Scattered small vessel ischemic microangiopathy.   Electronically Signed   By: Roanna Raider M.D.   On: 08/27/2014 22:31     EKG Interpretation None     ED ECG REPORT   Date: 08/27/2014  Rate: 67  Rhythm: normal sinus rhythm  QRS Axis: left  Intervals: PR prolonged  ST/T Wave abnormalities: nonspecific ST changes  Conduction Disutrbances:left anterior fascicular block  Narrative Interpretation: Poor R-wave progression, borderline LVH  Old EKG Reviewed: unchanged  I have personally reviewed the EKG tracing and agree with the computerized printout as noted.   MDM   Final diagnoses:  Hypoglycemia    BP 139/89 mmHg  Pulse 58  Resp 20  SpO2 100% T 97.6 Oral  I have reviewed nursing notes and vital signs. I personally viewed the imaging tests through PACS system and agrees with radiologist's intepretation I reviewed available ER/hospitalization records through the EMR   ADDENDUM: Head CT scan obtain due to pt having prior hx of stroke, was found altered, hypoglycemic and also found on the ground beside his bed.  CT was obtain to r/o skull fractures, intracranial injury, or signs of stroke.     Fayrene Helper, PA-C 08/28/14 4540  Jerelyn Scott, MD 08/28/14 0057  Fayrene Helper, PA-C 09/06/14 9811  Jerelyn Scott, MD 09/06/14 [id number]

## 2014-08-27 NOTE — ED Notes (Signed)
Pt from home EMS states he did not eat much today. Has been experiencing hypoglycemia for about 1 hr. Pt has had 25 g of D5W and temporarily raised his CBG to 93, then it dropped again to 68, per EMS. Pt has a hx of a hemorraghic stroke, but per his wife he is at his baseline.

## 2014-08-28 DIAGNOSIS — E11649 Type 2 diabetes mellitus with hypoglycemia without coma: Secondary | ICD-10-CM | POA: Diagnosis not present

## 2014-08-28 NOTE — Discharge Instructions (Signed)
Please make sure to eat appropriately when taking insulin.  Follow up with your doctor for further care.  Return if your condition worsen or if you have other concerns.  Low Blood Sugar Low blood sugar (hypoglycemia) means that the level of sugar in your blood is lower than it should be. Signs of low blood sugar include:  Getting sweaty.  Feeling hungry.  Feeling dizzy or weak.  Feeling sleepier than normal.  Feeling nervous.  Headaches.  Having a fast heartbeat. Low blood sugar can happen fast and can be an emergency. Your doctor can do tests to check your blood sugar level. You can have low blood sugar and not have diabetes. HOME CARE  Check your blood sugar as told by your doctor. If it is less than 70 mg/dl or as told by your doctor, take 1 of the following:  3 to 4 glucose tablets.   cup clear juice.   cup soda pop, not diet.  1 cup milk.  5 to 6 hard candies.  Recheck blood sugar after 15 minutes. Repeat until it is at the right level.  Eat a snack if it is more than 1 hour until the next meal.  Only take medicine as told by your doctor.  Do not skip meals. Eat on time.  Do not drink alcohol except with meals.  Check your blood glucose before driving.  Check your blood glucose before and after exercise.  Always carry treatment with you, such as glucose pills.  Always wear a medical alert bracelet if you have diabetes. GET HELP RIGHT AWAY IF:   Your blood glucose goes below 70 mg/dl or as told by your doctor, and you:  Are confused.  Are not able to swallow.  Pass out (faint).  You cannot treat yourself. You may need someone to help you.  You have low blood sugar problems often.  You have problems from your medicines.  You are not feeling better after 3 to 4 days.  You have vision changes. MAKE SURE YOU:   Understand these instructions.  Will watch this condition.  Will get help right away if you are not doing well or get  worse. Document Released: 04/18/2009 Document Revised: 04/16/2011 Document Reviewed: 04/18/2009 [facility] Patient Information 2015 [place], [place]. This information is not intended to replace advice given to you by your health care provider. Make sure you discuss any questions you have with your health care provider.

## 2014-08-30 ENCOUNTER — Telehealth: Payer: Self-pay | Admitting: Cardiovascular Disease

## 2014-08-30 NOTE — Telephone Encounter (Signed)
Received records from Dr Priscille Kluver [facility] requested by Dr Allyson Sabal.  Records given to Dr Allyson Sabal to review.  lp

## 2014-09-03 DIAGNOSIS — T8189XD Other complications of procedures, not elsewhere classified, subsequent encounter: Secondary | ICD-10-CM | POA: Diagnosis not present

## 2014-09-16 ENCOUNTER — Other Ambulatory Visit: Payer: Self-pay | Admitting: Internal Medicine

## 2014-09-22 ENCOUNTER — Telehealth: Payer: Self-pay

## 2014-09-22 NOTE — Telephone Encounter (Signed)
Nurse called patients wife, wife verified date of birth. Patient and wife are in [place] for family emergency. Nurse asked wife about neurology appointment, wife explains she has all her important appointment documents at her house but knows that he does not have appointment this month. She agrees to check calendar when they return home for neurology appointment.  Wife reports patient has not been on aspirin or Plavix.  Nurse transferred wife to front office staff to cancel appointment at [facility] on 10/06/14 due to being in [place] until first week of September.

## 2014-09-23 DIAGNOSIS — R609 Edema, unspecified: Secondary | ICD-10-CM | POA: Diagnosis not present

## 2014-09-23 DIAGNOSIS — M6281 Muscle weakness (generalized): Secondary | ICD-10-CM | POA: Diagnosis not present

## 2014-09-23 DIAGNOSIS — I10 Essential (primary) hypertension: Secondary | ICD-10-CM | POA: Diagnosis not present

## 2014-09-23 DIAGNOSIS — R2689 Other abnormalities of gait and mobility: Secondary | ICD-10-CM | POA: Diagnosis not present

## 2014-09-23 DIAGNOSIS — I69820 Aphasia following other cerebrovascular disease: Secondary | ICD-10-CM | POA: Diagnosis not present

## 2014-09-23 DIAGNOSIS — R358 Other polyuria: Secondary | ICD-10-CM | POA: Diagnosis not present

## 2014-09-23 DIAGNOSIS — E119 Type 2 diabetes mellitus without complications: Secondary | ICD-10-CM | POA: Diagnosis not present

## 2014-09-23 DIAGNOSIS — R531 Weakness: Secondary | ICD-10-CM | POA: Diagnosis not present

## 2014-10-06 ENCOUNTER — Ambulatory Visit: Payer: Medicare Other | Admitting: Internal Medicine

## 2014-10-15 ENCOUNTER — Ambulatory Visit: Payer: Medicare Other | Admitting: Internal Medicine

## 2014-10-28 ENCOUNTER — Telehealth: Payer: Self-pay | Admitting: Internal Medicine

## 2014-10-28 ENCOUNTER — Telehealth: Payer: Self-pay

## 2014-10-28 NOTE — Telephone Encounter (Signed)
Returned patient phone call and spoke with his wife Protective services has been out to the house twice this week Patient needs multiple things such as aid, extended toilet seat wheel chair etc Explained to the wife he will need to make an appt. To come in to see his provider Call transferred to front office to schedule and appt

## 2014-10-28 NOTE — Telephone Encounter (Signed)
Patiens wife called stating that he needs a letter from the doctor stating that he is disabled. Patient's wife is also requesting orders for home aid equipment. She stated that protective services visited home and that he needs a wheelchair, bar, shower chair and orthopedic shoes. Patient's wife would like a call from the nurse. Please follow up.

## 2014-11-01 NOTE — Telephone Encounter (Signed)
Patient's Wife came in requesting a document, stating that her husband had a stroke. Husband needs medical supplies for the home. Patients wife stated that he needs a wheel chair, grab bar, and an extended shower chair. She also requested a home health aid for him. Please follow up.

## 2014-11-02 ENCOUNTER — Telehealth: Payer: Self-pay

## 2014-11-02 ENCOUNTER — Other Ambulatory Visit: Payer: Self-pay

## 2014-11-02 DIAGNOSIS — I639 Cerebral infarction, unspecified: Secondary | ICD-10-CM

## 2014-11-02 MED ORDER — BATHTUB SAFETY RAIL MISC: Status: DC

## 2014-11-02 NOTE — Telephone Encounter (Signed)
Spoke with Justin Molina to let her know the prescriptions for wheel chair Shower chair and safety rails are ready to be picked up Justin Molina stated she will be in tomorrow afternoon to pick them up

## 2014-11-02 NOTE — Telephone Encounter (Signed)
May send order for these items. Find out if they need to be sent to [facility].

## 2014-11-02 NOTE — Telephone Encounter (Signed)
Returned patient phone call Patient's wife is requesting a prescription for  Shower bar, shower chair and manual wheel chair Patient suffered a stroke and protective services told her She needs these things for his safety Patient will also be dropping off a scat form that needs to be filled out For transportation purposes

## 2014-11-03 ENCOUNTER — Ambulatory Visit: Payer: Medicare Other | Admitting: Internal Medicine

## 2014-11-07 ENCOUNTER — Telehealth: Payer: Self-pay | Admitting: Internal Medicine

## 2014-11-07 DIAGNOSIS — E119 Type 2 diabetes mellitus without complications: Secondary | ICD-10-CM

## 2014-11-11 ENCOUNTER — Other Ambulatory Visit: Payer: Self-pay

## 2014-11-16 NOTE — Telephone Encounter (Signed)
Patients wife called stating that [facility] discontinues the TRU test meter that the patient used to use. [facility] stated they need a new prescription for a new glucose meter and test strips. Patient is out of test strips, and since the meter is discontinued he can not check is blood sugar. Patient would like for the prescription to be sent to [facility] on [address] [place]. Please f/u

## 2014-11-17 MED ORDER — GLUCOSE BLOOD VI STRP: ORAL_STRIP | Status: DC

## 2014-11-17 MED ORDER — ACCU-CHEK AVIVA PLUS W/DEVICE KIT: PACK | Status: DC

## 2014-11-17 MED ORDER — LANCETS 28G MISC
Status: DC
Start: 2014-11-17 — End: 2014-11-23

## 2014-11-22 ENCOUNTER — Other Ambulatory Visit: Payer: Self-pay | Admitting: Internal Medicine

## 2014-11-23 ENCOUNTER — Other Ambulatory Visit: Payer: Self-pay

## 2014-11-23 ENCOUNTER — Telehealth: Payer: Self-pay

## 2014-11-23 DIAGNOSIS — E119 Type 2 diabetes mellitus without complications: Secondary | ICD-10-CM

## 2014-11-23 MED ORDER — LANCETS 28G MISC
Status: DC
Start: 2014-11-23 — End: 2015-05-23

## 2014-11-23 MED ORDER — GLUCOSE BLOOD VI STRP: ORAL_STRIP | Status: DC

## 2014-11-23 NOTE — Telephone Encounter (Signed)
Returned patient phone call Patient not available Left message on voice mail to return our call 

## 2014-11-23 NOTE — Telephone Encounter (Signed)
Pt. Wife called request for the PCP to send a prescription for the test strips for the AVIVA Plus meter. Pt. Test his blood sugar 4 time a day and needs a prescription for 90 days. Please f/u with pt.

## 2014-11-26 NOTE — Telephone Encounter (Signed)
Patient's wife presents to clinic to pick up prescriptions for mobility devices.  Prescriptions given to wife. Patient's wife then gives a prescription for Diabetic shoes.  Prescription placed in providers box

## 2014-11-26 NOTE — Telephone Encounter (Signed)
Patients wife picked up CMN form for [facility]. Form was filled out and completed for test strips.

## 2014-12-09 ENCOUNTER — Telehealth: Payer: Self-pay

## 2014-12-22 ENCOUNTER — Other Ambulatory Visit: Payer: Self-pay | Admitting: Internal Medicine

## 2014-12-23 ENCOUNTER — Telehealth: Payer: Self-pay | Admitting: Internal Medicine

## 2014-12-23 DIAGNOSIS — I639 Cerebral infarction, unspecified: Secondary | ICD-10-CM

## 2014-12-23 NOTE — Telephone Encounter (Signed)
Patients wife called and requested a new prescription for diabetic shoes, a shower chair, and a wheel chair. Wife also requested an order for a home healthcare aide to help patient with ADL's.  Patients wife stated that the prescriptions have been written before but has not been able to come and pick up the prescriptions in a month. Staff tried to locate the prescriptions but after a month paperwork that has not been picked up is shredded. Patients wife was made aware of this.   Please f/u

## 2014-12-25 ENCOUNTER — Emergency Department (HOSPITAL_COMMUNITY)
Admission: EM | Admit: 2014-12-25 | Discharge: 2014-12-26 | Disposition: A | Discharge status: Home / Self Care | Payer: Medicare Other | Attending: Emergency Medicine | Admitting: Emergency Medicine

## 2014-12-25 ENCOUNTER — Encounter (HOSPITAL_COMMUNITY): Payer: Self-pay | Admitting: Emergency Medicine

## 2014-12-25 DIAGNOSIS — I129 Hypertensive chronic kidney disease with stage 1 through stage 4 chronic kidney disease, or unspecified chronic kidney disease: Secondary | ICD-10-CM | POA: Insufficient documentation

## 2014-12-25 DIAGNOSIS — K0889 Other specified disorders of teeth and supporting structures: Secondary | ICD-10-CM | POA: Diagnosis not present

## 2014-12-25 DIAGNOSIS — Z79899 Other long term (current) drug therapy: Secondary | ICD-10-CM | POA: Insufficient documentation

## 2014-12-25 DIAGNOSIS — Z8673 Personal history of transient ischemic attack (TIA), and cerebral infarction without residual deficits: Secondary | ICD-10-CM | POA: Insufficient documentation

## 2014-12-25 DIAGNOSIS — Z7951 Long term (current) use of inhaled steroids: Secondary | ICD-10-CM | POA: Insufficient documentation

## 2014-12-25 DIAGNOSIS — E119 Type 2 diabetes mellitus without complications: Secondary | ICD-10-CM | POA: Diagnosis not present

## 2014-12-25 DIAGNOSIS — N189 Chronic kidney disease, unspecified: Secondary | ICD-10-CM | POA: Diagnosis not present

## 2014-12-25 MED ORDER — PENICILLIN V POTASSIUM 250 MG PO TABS: 250.0000 mg | ORAL_TABLET | Freq: Four times a day (QID) | ORAL | Status: AC

## 2014-12-25 MED ORDER — HYDROCODONE-ACETAMINOPHEN 5-325 MG PO TABS
1.0000 | ORAL_TABLET | Freq: Once | ORAL | Status: AC
  Administered 2014-12-26: 1 via ORAL
  Filled 2014-12-25: qty 1

## 2014-12-25 NOTE — ED Notes (Signed)
Pt. reports upper dental pain with swelling onset this week .

## 2014-12-25 NOTE — ED Provider Notes (Signed)
CSN: [id number]     Arrival date & time 12/25/14  2329 History   First MD Initiated Contact with Patient 12/25/14 2337     Chief Complaint  Patient presents with  . Dental Pain     (Consider location/radiation/quality/duration/timing/severity/associated sxs/prior Treatment) HPI Comments: Pt comes in with c/o upper dental pain. He states that he has had an extraction but he is having pain to the area. No fever, problems swallowing or breathing. They have had a hard time finding a dentist. His wife states that he needs pain medication for the symptoms  The history is provided by the patient. No language interpreter was used.    Past Medical History  Diagnosis Date  . Diabetes mellitus without complication ([place])   . Stroke ([place])   . Hypertension   . Chronic renal insufficiency    Past Surgical History  Procedure Laterality Date  . Hernia repair    . Tracheostomy    . Brain surgery    . Gastrostomy tube placement     No family history on file. Social History  Substance Use Topics  . Smoking status: Never Smoker   . Smokeless tobacco: None  . Alcohol Use: No    Review of Systems  All other systems reviewed and are negative.     Allergies  Contrast media  Home Medications   Prior to Admission medications   Medication Sig Start Date End Date Taking? Authorizing Provider  ACCU-CHEK AVIVA PLUS test strip USE TO CHECK BLOOD SUGAR FOUR TIMES DAILY 09/16/14   Lance Bosch, NP  amLODipine (NORVASC) 10 MG tablet Take 1 tablet (10 mg total) by mouth daily. 08/12/14   Lance Bosch, NP  atorvastatin (LIPITOR) 40 MG tablet Take 1 tablet (40 mg total) by mouth at bedtime. 08/12/14   Lance Bosch, NP  Blood Glucose Monitoring Suppl (ACCU-CHEK AVIVA PLUS) W/DEVICE KIT Check blood sugar TID  & QHS 11/17/14   Lance Bosch, NP  busPIRone (BUSPAR) 10 MG tablet TAKE 1 TABLET(10 MG) BY MOUTH TWICE DAILY FOR ANXIETY 12/23/14   Lance Bosch, NP  carvedilol (COREG) 25 MG tablet Take 2  tablets (50 mg total) by mouth 2 (two) times daily with a meal. 08/12/14   Lance Bosch, NP  cloNIDine (CATAPRES) 0.2 MG tablet Take 1 tablet (0.2 mg total) by mouth 3 (three) times daily. 08/12/14   Lance Bosch, NP  fluticasone (FLONASE) 50 MCG/ACT nasal spray Place 1 spray into both nostrils daily as needed for allergies.     Historical Provider, MD  furosemide (LASIX) 20 MG tablet Take 1 tablet (20 mg total) by mouth 2 (two) times daily. Patient not taking: Reported on 08/27/2014 06/11/14   Lance Bosch, NP  glimepiride (AMARYL) 4 MG tablet Take 1 tablet (4 mg total) by mouth 2 (two) times daily. 08/12/14   Lance Bosch, NP  glucose blood (ACCU-CHEK AVIVA PLUS) test strip Use as instructed 11/23/14   Lance Bosch, NP  hydrALAZINE (APRESOLINE) 50 MG tablet Take 1 tablet (50 mg total) by mouth 3 (three) times daily. 08/12/14   Lance Bosch, NP  Lancets 28G MISC Check blood sugar TID & QHS 11/23/14   Lance Bosch, NP  LEVEMIR FLEXTOUCH 100 UNIT/ML Pen Inject 48 Units into the skin daily at 10 pm. 08/17/14   Tresa Garter, MD  Misc. Devices (BATHTUB SAFETY RAIL) MISC Patient needs safety rails for shower 11/02/14   Lance Bosch, NP  NOVOLOG FLEXPEN 100 UNIT/ML FlexPen Inject 28-32 Units into the skin 3 (three) times daily with meals. 08/17/14   Tresa Garter, MD  penicillin v potassium (VEETID) 250 MG tablet Take 1 tablet (250 mg total) by mouth 4 (four) times daily. 12/25/14 01/01/15  Glendell Docker, NP   BP 161/101 mmHg  Pulse 85  Temp(Src) 97.9 F (36.6 C) (Oral)  Resp 18  SpO2 98% Physical Exam  Constitutional: He appears well-developed and well-nourished.  HENT:  Right Ear: External ear normal.  Tender to the upper front dentition. No deformity noted  Cardiovascular: Normal rate and regular rhythm.   Pulmonary/Chest: Effort normal.  Neurological: He is alert.  Skin: Skin is warm and dry.  Nursing note and vitals reviewed.   ED Course  Procedures (including  critical care time) Labs Review Labs Reviewed - No data to display  Imaging Review No results found. I have personally reviewed and evaluated these images and lab results as part of my medical decision-making.   EKG Interpretation None      MDM   Final diagnoses:  Pain, dental    No sign of abscess. Discussed with wife that narcotics would not be given.given pcn and and dental referal:wife was upset with staff for not giving narcotics     Glendell Docker, NP 12/26/14 0004  Glendell Docker, NP 12/26/14 [place] Ward, DO 12/26/14 4098

## 2014-12-26 DIAGNOSIS — K0889 Other specified disorders of teeth and supporting structures: Secondary | ICD-10-CM | POA: Diagnosis not present

## 2014-12-26 MED ORDER — PENICILLIN V POTASSIUM 250 MG PO TABS
250.0000 mg | ORAL_TABLET | Freq: Once | ORAL | Status: AC
  Administered 2014-12-26: 250 mg via ORAL
  Filled 2014-12-26: qty 1

## 2014-12-26 MED ORDER — ACETAMINOPHEN 500 MG PO TABS: 500.0000 mg | ORAL_TABLET | Freq: Four times a day (QID) | ORAL | Status: AC | PRN

## 2014-12-26 NOTE — ED Notes (Signed)
Patient's wife stated that she needs a prescription for pain medication for the dental pain.  Patient offered prescription for OTC tylenol.  Patient and wife educated on the policy of [place] on prescriptions for dental pain.  Wife upset due to policy change and wants to speak with supervisor.  Jolaine ClickM Browning in with patient and wife.

## 2014-12-28 ENCOUNTER — Other Ambulatory Visit: Payer: Medicare Other

## 2015-01-04 ENCOUNTER — Telehealth: Payer: Self-pay

## 2015-01-04 MED ORDER — BATHTUB SAFETY RAIL MISC: Status: DC

## 2015-01-04 NOTE — Telephone Encounter (Signed)
He has to have a appointment fort documentation before home health can be ordered for insurance reasons. Usually they bring me paperwork to fill out for diabetic shoes

## 2015-01-04 NOTE — Telephone Encounter (Signed)
Patients wife returned phone call, advised of prescriptions ready to be picked up, she also wanted to know about the paperwork for the diabetic shoes. She stated that the forms have already been dropped off.  Please f/u

## 2015-01-04 NOTE — Telephone Encounter (Signed)
Attempted to contact patient's wife to let her know her orders have been printed for wheelchair Shower bars and shower chair Not available at this time  Message left to return call call RX in fan folder in front office

## 2015-01-04 NOTE — Telephone Encounter (Signed)
Patient's wife is requesting a RX  For diabetic shoes as well as home health care to help with ADL's i have already reprinted the scripts for shower and wheel chair Can you help me with this thanks

## 2015-01-10 ENCOUNTER — Telehealth: Payer: Self-pay

## 2015-01-10 NOTE — Telephone Encounter (Signed)
Returned phone call to patient's wife Miss Justin Molina not available Unable to leave voice mail Mail box is full

## 2015-01-11 ENCOUNTER — Other Ambulatory Visit: Payer: Medicare Other

## 2015-01-11 ENCOUNTER — Inpatient Hospital Stay: Payer: Medicare Other | Admitting: Internal Medicine

## 2015-01-14 NOTE — Telephone Encounter (Signed)
Patient returned phone call, please f/u with pt.    °

## 2015-01-24 ENCOUNTER — Other Ambulatory Visit: Payer: Self-pay | Admitting: Internal Medicine

## 2015-01-26 ENCOUNTER — Inpatient Hospital Stay: Payer: Medicare Other | Admitting: Internal Medicine

## 2015-01-27 DIAGNOSIS — R7309 Other abnormal glucose: Secondary | ICD-10-CM | POA: Diagnosis not present

## 2015-01-27 DIAGNOSIS — E161 Other hypoglycemia: Secondary | ICD-10-CM | POA: Diagnosis not present

## 2015-02-01 NOTE — Telephone Encounter (Signed)
error 

## 2015-02-02 ENCOUNTER — Other Ambulatory Visit: Payer: Self-pay | Admitting: Internal Medicine

## 2015-02-10 ENCOUNTER — Other Ambulatory Visit: Payer: Self-pay | Admitting: *Deleted

## 2015-02-10 ENCOUNTER — Other Ambulatory Visit: Payer: Self-pay | Admitting: Internal Medicine

## 2015-02-10 DIAGNOSIS — I10 Essential (primary) hypertension: Secondary | ICD-10-CM

## 2015-02-10 MED ORDER — CLONIDINE HCL 0.2 MG PO TABS: 0.2000 mg | ORAL_TABLET | Freq: Three times a day (TID) | ORAL | Status: DC

## 2015-02-24 ENCOUNTER — Other Ambulatory Visit: Payer: Self-pay | Admitting: Internal Medicine

## 2015-02-28 DIAGNOSIS — Z794 Long term (current) use of insulin: Secondary | ICD-10-CM | POA: Diagnosis not present

## 2015-02-28 DIAGNOSIS — I10 Essential (primary) hypertension: Secondary | ICD-10-CM | POA: Diagnosis not present

## 2015-02-28 DIAGNOSIS — I679 Cerebrovascular disease, unspecified: Secondary | ICD-10-CM | POA: Diagnosis not present

## 2015-02-28 DIAGNOSIS — Z125 Encounter for screening for malignant neoplasm of prostate: Secondary | ICD-10-CM | POA: Diagnosis not present

## 2015-02-28 DIAGNOSIS — E1165 Type 2 diabetes mellitus with hyperglycemia: Secondary | ICD-10-CM | POA: Diagnosis not present

## 2015-02-28 DIAGNOSIS — F419 Anxiety disorder, unspecified: Secondary | ICD-10-CM | POA: Diagnosis not present

## 2015-02-28 DIAGNOSIS — E7800 Pure hypercholesterolemia, unspecified: Secondary | ICD-10-CM | POA: Diagnosis not present

## 2015-02-28 DIAGNOSIS — Z7984 Long term (current) use of oral hypoglycemic drugs: Secondary | ICD-10-CM | POA: Diagnosis not present

## 2015-03-02 DIAGNOSIS — I69320 Aphasia following cerebral infarction: Secondary | ICD-10-CM | POA: Diagnosis not present

## 2015-03-02 DIAGNOSIS — I129 Hypertensive chronic kidney disease with stage 1 through stage 4 chronic kidney disease, or unspecified chronic kidney disease: Secondary | ICD-10-CM | POA: Diagnosis not present

## 2015-03-02 DIAGNOSIS — I69351 Hemiplegia and hemiparesis following cerebral infarction affecting right dominant side: Secondary | ICD-10-CM | POA: Diagnosis not present

## 2015-03-02 DIAGNOSIS — N189 Chronic kidney disease, unspecified: Secondary | ICD-10-CM | POA: Diagnosis not present

## 2015-03-02 DIAGNOSIS — E785 Hyperlipidemia, unspecified: Secondary | ICD-10-CM | POA: Diagnosis not present

## 2015-03-02 DIAGNOSIS — F419 Anxiety disorder, unspecified: Secondary | ICD-10-CM | POA: Diagnosis not present

## 2015-03-02 DIAGNOSIS — E1122 Type 2 diabetes mellitus with diabetic chronic kidney disease: Secondary | ICD-10-CM | POA: Diagnosis not present

## 2015-03-02 DIAGNOSIS — Z7984 Long term (current) use of oral hypoglycemic drugs: Secondary | ICD-10-CM | POA: Diagnosis not present

## 2015-03-02 DIAGNOSIS — E1142 Type 2 diabetes mellitus with diabetic polyneuropathy: Secondary | ICD-10-CM | POA: Diagnosis not present

## 2015-03-02 DIAGNOSIS — Z794 Long term (current) use of insulin: Secondary | ICD-10-CM | POA: Diagnosis not present

## 2015-03-04 DIAGNOSIS — E1122 Type 2 diabetes mellitus with diabetic chronic kidney disease: Secondary | ICD-10-CM | POA: Diagnosis not present

## 2015-03-04 DIAGNOSIS — N189 Chronic kidney disease, unspecified: Secondary | ICD-10-CM | POA: Diagnosis not present

## 2015-03-04 DIAGNOSIS — E1142 Type 2 diabetes mellitus with diabetic polyneuropathy: Secondary | ICD-10-CM | POA: Diagnosis not present

## 2015-03-04 DIAGNOSIS — I69320 Aphasia following cerebral infarction: Secondary | ICD-10-CM | POA: Diagnosis not present

## 2015-03-04 DIAGNOSIS — I129 Hypertensive chronic kidney disease with stage 1 through stage 4 chronic kidney disease, or unspecified chronic kidney disease: Secondary | ICD-10-CM | POA: Diagnosis not present

## 2015-03-04 DIAGNOSIS — I69351 Hemiplegia and hemiparesis following cerebral infarction affecting right dominant side: Secondary | ICD-10-CM | POA: Diagnosis not present

## 2015-03-07 DIAGNOSIS — E1122 Type 2 diabetes mellitus with diabetic chronic kidney disease: Secondary | ICD-10-CM | POA: Diagnosis not present

## 2015-03-07 DIAGNOSIS — N189 Chronic kidney disease, unspecified: Secondary | ICD-10-CM | POA: Diagnosis not present

## 2015-03-07 DIAGNOSIS — I129 Hypertensive chronic kidney disease with stage 1 through stage 4 chronic kidney disease, or unspecified chronic kidney disease: Secondary | ICD-10-CM | POA: Diagnosis not present

## 2015-03-07 DIAGNOSIS — I69320 Aphasia following cerebral infarction: Secondary | ICD-10-CM | POA: Diagnosis not present

## 2015-03-07 DIAGNOSIS — E1142 Type 2 diabetes mellitus with diabetic polyneuropathy: Secondary | ICD-10-CM | POA: Diagnosis not present

## 2015-03-07 DIAGNOSIS — I69351 Hemiplegia and hemiparesis following cerebral infarction affecting right dominant side: Secondary | ICD-10-CM | POA: Diagnosis not present

## 2015-03-08 DIAGNOSIS — E1122 Type 2 diabetes mellitus with diabetic chronic kidney disease: Secondary | ICD-10-CM | POA: Diagnosis not present

## 2015-03-08 DIAGNOSIS — I69320 Aphasia following cerebral infarction: Secondary | ICD-10-CM | POA: Diagnosis not present

## 2015-03-08 DIAGNOSIS — I129 Hypertensive chronic kidney disease with stage 1 through stage 4 chronic kidney disease, or unspecified chronic kidney disease: Secondary | ICD-10-CM | POA: Diagnosis not present

## 2015-03-08 DIAGNOSIS — I69351 Hemiplegia and hemiparesis following cerebral infarction affecting right dominant side: Secondary | ICD-10-CM | POA: Diagnosis not present

## 2015-03-08 DIAGNOSIS — N189 Chronic kidney disease, unspecified: Secondary | ICD-10-CM | POA: Diagnosis not present

## 2015-03-08 DIAGNOSIS — E1142 Type 2 diabetes mellitus with diabetic polyneuropathy: Secondary | ICD-10-CM | POA: Diagnosis not present

## 2015-03-09 DIAGNOSIS — I129 Hypertensive chronic kidney disease with stage 1 through stage 4 chronic kidney disease, or unspecified chronic kidney disease: Secondary | ICD-10-CM | POA: Diagnosis not present

## 2015-03-09 DIAGNOSIS — I69320 Aphasia following cerebral infarction: Secondary | ICD-10-CM | POA: Diagnosis not present

## 2015-03-09 DIAGNOSIS — E1122 Type 2 diabetes mellitus with diabetic chronic kidney disease: Secondary | ICD-10-CM | POA: Diagnosis not present

## 2015-03-09 DIAGNOSIS — E1142 Type 2 diabetes mellitus with diabetic polyneuropathy: Secondary | ICD-10-CM | POA: Diagnosis not present

## 2015-03-09 DIAGNOSIS — I69351 Hemiplegia and hemiparesis following cerebral infarction affecting right dominant side: Secondary | ICD-10-CM | POA: Diagnosis not present

## 2015-03-09 DIAGNOSIS — N189 Chronic kidney disease, unspecified: Secondary | ICD-10-CM | POA: Diagnosis not present

## 2015-03-10 DIAGNOSIS — I69320 Aphasia following cerebral infarction: Secondary | ICD-10-CM | POA: Diagnosis not present

## 2015-03-10 DIAGNOSIS — N189 Chronic kidney disease, unspecified: Secondary | ICD-10-CM | POA: Diagnosis not present

## 2015-03-10 DIAGNOSIS — E1122 Type 2 diabetes mellitus with diabetic chronic kidney disease: Secondary | ICD-10-CM | POA: Diagnosis not present

## 2015-03-10 DIAGNOSIS — I129 Hypertensive chronic kidney disease with stage 1 through stage 4 chronic kidney disease, or unspecified chronic kidney disease: Secondary | ICD-10-CM | POA: Diagnosis not present

## 2015-03-10 DIAGNOSIS — E1142 Type 2 diabetes mellitus with diabetic polyneuropathy: Secondary | ICD-10-CM | POA: Diagnosis not present

## 2015-03-10 DIAGNOSIS — I69351 Hemiplegia and hemiparesis following cerebral infarction affecting right dominant side: Secondary | ICD-10-CM | POA: Diagnosis not present

## 2015-03-14 DIAGNOSIS — E1122 Type 2 diabetes mellitus with diabetic chronic kidney disease: Secondary | ICD-10-CM | POA: Diagnosis not present

## 2015-03-14 DIAGNOSIS — I129 Hypertensive chronic kidney disease with stage 1 through stage 4 chronic kidney disease, or unspecified chronic kidney disease: Secondary | ICD-10-CM | POA: Diagnosis not present

## 2015-03-14 DIAGNOSIS — I69320 Aphasia following cerebral infarction: Secondary | ICD-10-CM | POA: Diagnosis not present

## 2015-03-14 DIAGNOSIS — E1142 Type 2 diabetes mellitus with diabetic polyneuropathy: Secondary | ICD-10-CM | POA: Diagnosis not present

## 2015-03-14 DIAGNOSIS — N189 Chronic kidney disease, unspecified: Secondary | ICD-10-CM | POA: Diagnosis not present

## 2015-03-14 DIAGNOSIS — I69351 Hemiplegia and hemiparesis following cerebral infarction affecting right dominant side: Secondary | ICD-10-CM | POA: Diagnosis not present

## 2015-03-15 DIAGNOSIS — N189 Chronic kidney disease, unspecified: Secondary | ICD-10-CM | POA: Diagnosis not present

## 2015-03-15 DIAGNOSIS — I69320 Aphasia following cerebral infarction: Secondary | ICD-10-CM | POA: Diagnosis not present

## 2015-03-15 DIAGNOSIS — E1142 Type 2 diabetes mellitus with diabetic polyneuropathy: Secondary | ICD-10-CM | POA: Diagnosis not present

## 2015-03-15 DIAGNOSIS — I129 Hypertensive chronic kidney disease with stage 1 through stage 4 chronic kidney disease, or unspecified chronic kidney disease: Secondary | ICD-10-CM | POA: Diagnosis not present

## 2015-03-15 DIAGNOSIS — E1122 Type 2 diabetes mellitus with diabetic chronic kidney disease: Secondary | ICD-10-CM | POA: Diagnosis not present

## 2015-03-15 DIAGNOSIS — I69351 Hemiplegia and hemiparesis following cerebral infarction affecting right dominant side: Secondary | ICD-10-CM | POA: Diagnosis not present

## 2015-03-16 DIAGNOSIS — N189 Chronic kidney disease, unspecified: Secondary | ICD-10-CM | POA: Diagnosis not present

## 2015-03-16 DIAGNOSIS — I69351 Hemiplegia and hemiparesis following cerebral infarction affecting right dominant side: Secondary | ICD-10-CM | POA: Diagnosis not present

## 2015-03-16 DIAGNOSIS — I129 Hypertensive chronic kidney disease with stage 1 through stage 4 chronic kidney disease, or unspecified chronic kidney disease: Secondary | ICD-10-CM | POA: Diagnosis not present

## 2015-03-16 DIAGNOSIS — I69320 Aphasia following cerebral infarction: Secondary | ICD-10-CM | POA: Diagnosis not present

## 2015-03-16 DIAGNOSIS — E1122 Type 2 diabetes mellitus with diabetic chronic kidney disease: Secondary | ICD-10-CM | POA: Diagnosis not present

## 2015-03-16 DIAGNOSIS — E1142 Type 2 diabetes mellitus with diabetic polyneuropathy: Secondary | ICD-10-CM | POA: Diagnosis not present

## 2015-03-17 DIAGNOSIS — I69320 Aphasia following cerebral infarction: Secondary | ICD-10-CM | POA: Diagnosis not present

## 2015-03-17 DIAGNOSIS — N189 Chronic kidney disease, unspecified: Secondary | ICD-10-CM | POA: Diagnosis not present

## 2015-03-17 DIAGNOSIS — E1142 Type 2 diabetes mellitus with diabetic polyneuropathy: Secondary | ICD-10-CM | POA: Diagnosis not present

## 2015-03-17 DIAGNOSIS — I129 Hypertensive chronic kidney disease with stage 1 through stage 4 chronic kidney disease, or unspecified chronic kidney disease: Secondary | ICD-10-CM | POA: Diagnosis not present

## 2015-03-17 DIAGNOSIS — E1122 Type 2 diabetes mellitus with diabetic chronic kidney disease: Secondary | ICD-10-CM | POA: Diagnosis not present

## 2015-03-17 DIAGNOSIS — I69351 Hemiplegia and hemiparesis following cerebral infarction affecting right dominant side: Secondary | ICD-10-CM | POA: Diagnosis not present

## 2015-03-18 ENCOUNTER — Emergency Department (HOSPITAL_COMMUNITY): Payer: Medicare Other

## 2015-03-18 ENCOUNTER — Encounter (HOSPITAL_COMMUNITY): Payer: Self-pay

## 2015-03-18 ENCOUNTER — Emergency Department (HOSPITAL_COMMUNITY)
Admission: EM | Admit: 2015-03-18 | Discharge: 2015-03-19 | Disposition: A | Discharge status: Home / Self Care | Payer: Medicare Other | Attending: Emergency Medicine | Admitting: Emergency Medicine

## 2015-03-18 DIAGNOSIS — E11649 Type 2 diabetes mellitus with hypoglycemia without coma: Secondary | ICD-10-CM | POA: Insufficient documentation

## 2015-03-18 DIAGNOSIS — Z7984 Long term (current) use of oral hypoglycemic drugs: Secondary | ICD-10-CM | POA: Diagnosis not present

## 2015-03-18 DIAGNOSIS — Z87448 Personal history of other diseases of urinary system: Secondary | ICD-10-CM | POA: Insufficient documentation

## 2015-03-18 DIAGNOSIS — R030 Elevated blood-pressure reading, without diagnosis of hypertension: Secondary | ICD-10-CM | POA: Insufficient documentation

## 2015-03-18 DIAGNOSIS — E1122 Type 2 diabetes mellitus with diabetic chronic kidney disease: Secondary | ICD-10-CM | POA: Diagnosis not present

## 2015-03-18 DIAGNOSIS — I10 Essential (primary) hypertension: Secondary | ICD-10-CM | POA: Insufficient documentation

## 2015-03-18 DIAGNOSIS — E162 Hypoglycemia, unspecified: Secondary | ICD-10-CM

## 2015-03-18 DIAGNOSIS — E161 Other hypoglycemia: Secondary | ICD-10-CM | POA: Diagnosis not present

## 2015-03-18 DIAGNOSIS — G8191 Hemiplegia, unspecified affecting right dominant side: Secondary | ICD-10-CM | POA: Insufficient documentation

## 2015-03-18 DIAGNOSIS — Z8673 Personal history of transient ischemic attack (TIA), and cerebral infarction without residual deficits: Secondary | ICD-10-CM | POA: Insufficient documentation

## 2015-03-18 DIAGNOSIS — R7309 Other abnormal glucose: Secondary | ICD-10-CM | POA: Diagnosis not present

## 2015-03-18 DIAGNOSIS — I69351 Hemiplegia and hemiparesis following cerebral infarction affecting right dominant side: Secondary | ICD-10-CM | POA: Diagnosis not present

## 2015-03-18 DIAGNOSIS — Z792 Long term (current) use of antibiotics: Secondary | ICD-10-CM | POA: Insufficient documentation

## 2015-03-18 DIAGNOSIS — I69320 Aphasia following cerebral infarction: Secondary | ICD-10-CM | POA: Diagnosis not present

## 2015-03-18 DIAGNOSIS — Z79899 Other long term (current) drug therapy: Secondary | ICD-10-CM | POA: Insufficient documentation

## 2015-03-18 DIAGNOSIS — Z794 Long term (current) use of insulin: Secondary | ICD-10-CM | POA: Diagnosis not present

## 2015-03-18 DIAGNOSIS — E1142 Type 2 diabetes mellitus with diabetic polyneuropathy: Secondary | ICD-10-CM | POA: Diagnosis not present

## 2015-03-18 DIAGNOSIS — N189 Chronic kidney disease, unspecified: Secondary | ICD-10-CM | POA: Diagnosis not present

## 2015-03-18 DIAGNOSIS — I517 Cardiomegaly: Secondary | ICD-10-CM | POA: Diagnosis not present

## 2015-03-18 DIAGNOSIS — IMO0001 Reserved for inherently not codable concepts without codable children: Secondary | ICD-10-CM

## 2015-03-18 DIAGNOSIS — I129 Hypertensive chronic kidney disease with stage 1 through stage 4 chronic kidney disease, or unspecified chronic kidney disease: Secondary | ICD-10-CM | POA: Diagnosis not present

## 2015-03-18 LAB — I-STAT TROPONIN, ED: Troponin i, poc: 0 ng/mL (ref 0.00–0.08)

## 2015-03-18 LAB — BASIC METABOLIC PANEL
Anion gap: 9 (ref 5–15)
BUN: 19 mg/dL (ref 6–20)
CALCIUM: 8.8 mg/dL — AB (ref 8.9–10.3)
CO2: 25 mmol/L (ref 22–32)
CREATININE: 1.46 mg/dL — AB (ref 0.61–1.24)
Chloride: 105 mmol/L (ref 101–111)
GFR calc Af Amer: >60 mL/min (ref >60)
GFR, EST NON AFRICAN AMERICAN: 53 mL/min — AB (ref >60)
GLUCOSE: 188 mg/dL — AB (ref 65–99)
Potassium: 3.5 mmol/L (ref 3.5–5.1)
Sodium: 139 mmol/L (ref 135–145)

## 2015-03-18 LAB — CBC WITH DIFFERENTIAL/PLATELET
BASOS ABS: 0 10*3/uL (ref 0.0–0.1)
Basophils Relative: 0 %
EOS ABS: 0.1 10*3/uL (ref 0.0–0.7)
Eosinophils Relative: 1 %
HEMATOCRIT: 43.1 % (ref 39.0–52.0)
Hemoglobin: 14.9 g/dL (ref 13.0–17.0)
LYMPHS ABS: 1.2 10*3/uL (ref 0.7–4.0)
LYMPHS PCT: 17 %
MCH: 28.2 pg (ref 26.0–34.0)
MCHC: 34.6 g/dL (ref 30.0–36.0)
MCV: 81.5 fL (ref 78.0–100.0)
Monocytes Absolute: 0.4 10*3/uL (ref 0.1–1.0)
Monocytes Relative: 5 %
NEUTROS PCT: 77 %
Neutro Abs: 5.7 10*3/uL (ref 1.7–7.7)
PLATELETS: 200 10*3/uL (ref 150–400)
RBC: 5.29 MIL/uL (ref 4.22–5.81)
RDW: 13.6 % (ref 11.5–15.5)
WBC: 7.3 10*3/uL (ref 4.0–10.5)

## 2015-03-18 LAB — TSH: TSH: 0.595 u[IU]/mL (ref 0.350–4.500)

## 2015-03-18 LAB — CBG MONITORING, ED: Glucose-Capillary: 164 mg/dL — ABNORMAL HIGH (ref 65–99)

## 2015-03-18 LAB — I-STAT CG4 LACTIC ACID, ED: Lactic Acid, Venous: 1.04 mmol/L (ref 0.5–2.0)

## 2015-03-18 NOTE — ED Notes (Addendum)
Pt urinated but family member poured it down the sink, pt will let us know again when he can urinate

## 2015-03-18 NOTE — ED Provider Notes (Signed)
CSN: [id number]     Arrival date & time 03/18/15  1831 History   First MD Initiated Contact with Patient 03/18/15 1847     Chief Complaint  Patient presents with  . Hypertension    HPI   Tin Engram is a 55 y.o. 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 His wife is present at bedside, who states he did not take his afternoon insulin or eat lunch, and reports her family member who was with the patient noted that the patient was difficult to wake up and was noted to have a low blood sugar, at which time EMS was called. Per EMS, BS was 43, and the patient was given one amp D50 with subsequent improvement in blood sugar to 119. The patient's wife also notes he was hypertensive at home with a diastolic BP above 409, though states he did not take his anti-hypertensive medication today. He denies dizziness, lightheadedness, headache, vision change, chest pain, shortness of breath, abdominal pain, N/V, numbness, new weakness, paresthesia.    Past Medical History  Diagnosis Date  . Diabetes mellitus without complication ([place])   . Stroke ([place])   . Hypertension   . Chronic renal insufficiency    Past Surgical History  Procedure Laterality Date  . Hernia repair    . Tracheostomy    . Brain surgery    . Gastrostomy tube placement     History reviewed. No pertinent family history. Social History  Substance Use Topics  . Smoking status: Never Smoker   . Smokeless tobacco: None  . Alcohol Use: No    Review of Systems  Constitutional: Negative for fever and chills.  Eyes: Negative for visual disturbance.  Respiratory: Negative for shortness of breath.   Cardiovascular: Negative for chest pain.  Gastrointestinal: Negative for nausea, vomiting and abdominal pain.  Neurological: Negative for dizziness, syncope, weakness, light-headedness, numbness and headaches.  All other systems reviewed and are  negative.     Allergies  Contrast media  Home Medications   Prior to Admission medications   Medication Sig Start Date End Date Taking? Authorizing Provider  amLODipine (NORVASC) 10 MG tablet Take 1 tablet (10 mg total) by mouth daily. 08/12/14  Yes Lance Bosch, NP  atorvastatin (LIPITOR) 40 MG tablet Take 1 tablet (40 mg total) by mouth at bedtime. 08/12/14  Yes Lance Bosch, NP  busPIRone (BUSPAR) 10 MG tablet Take 1 tablet (10 mg total) by mouth 2 (two) times daily. Needs office visit for refills 02/04/15  Yes Lance Bosch, NP  carvedilol (COREG) 25 MG tablet Take 2 tablets (50 mg total) by mouth 2 (two) times daily with a meal. Patient needs office visit for more refills 02/01/15  Yes Lance Bosch, NP  cephALEXin (KEFLEX) 500 MG capsule Take 500 mg by mouth 2 (two) times daily.   Yes Historical Provider, MD  cloNIDine (CATAPRES) 0.2 MG tablet Take 1 tablet (0.2 mg total) by mouth 3 (three) times daily. Pt needs OV for future refills 02/10/15  Yes Lance Bosch, NP  glimepiride (AMARYL) 4 MG tablet Take 1 tablet (4 mg total) by mouth 2 (two) times daily. 08/12/14  Yes Lance Bosch, NP  hydrALAZINE (APRESOLINE) 50 MG tablet Take 1 tablet (50 mg total) by mouth 3 (three) times daily. 08/12/14  Yes Lance Bosch, NP  LEVEMIR FLEXTOUCH 100 UNIT/ML Pen Inject 48 Units into the skin  daily at 10 pm. 08/17/14  Yes Olugbemiga E Doreene Burke, MD  NOVOLOG FLEXPEN 100 UNIT/ML FlexPen Inject 28-32 Units into the skin 3 (three) times daily with meals. 08/17/14  Yes Tresa Garter, MD  traMADol (ULTRAM) 50 MG tablet Take 50 mg by mouth every 6 (six) hours as needed for moderate pain or severe pain.   Yes Historical Provider, MD  ACCU-CHEK AVIVA PLUS test strip USE TO CHECK BLOOD SUGAR FOUR TIMES DAILY 09/16/14   Lance Bosch, NP  acetaminophen (TYLENOL) 500 MG tablet Take 1 tablet (500 mg total) by mouth every 6 (six) hours as needed. 12/26/14   Glendell Docker, NP  Blood Glucose Monitoring Suppl  (ACCU-CHEK AVIVA PLUS) W/DEVICE KIT Check blood sugar TID  & QHS 11/17/14   Lance Bosch, NP  carvedilol (COREG) 25 MG tablet Take 2 tablets (50 mg total) by mouth 2 (two) times daily with a meal. Patient not taking: Reported on 03/18/2015 08/12/14   Lance Bosch, NP  fluticasone (FLONASE) 50 MCG/ACT nasal spray Place 1 spray into both nostrils daily as needed for allergies.     Historical Provider, MD  furosemide (LASIX) 20 MG tablet Take 1 tablet (20 mg total) by mouth 2 (two) times daily. Patient not taking: Reported on 08/27/2014 06/11/14   Lance Bosch, NP  glucose blood (ACCU-CHEK AVIVA PLUS) test strip Use as instructed 11/23/14   Lance Bosch, NP  Lancets 28G MISC Check blood sugar TID & QHS 11/23/14   Lance Bosch, NP  Misc. Devices (BATHTUB SAFETY RAIL) MISC Patient needs safety rails for shower 01/04/15   Lance Bosch, NP    BP 143/91 mmHg  Pulse 67  Temp(Src) 97.9 F (36.6 C) (Rectal)  Resp 23  Ht 5' 11.5" (1.816 m)  Wt 131.09 kg  BMI 39.75 kg/m2  SpO2 96% Physical Exam  Constitutional: He is oriented to person, place, and time. He appears well-developed and well-nourished. No distress.  HENT:  Head: Normocephalic and atraumatic.  Right Ear: External ear normal.  Left Ear: External ear normal.  Nose: Nose normal.  Mouth/Throat: Uvula is midline, oropharynx is clear and moist and mucous membranes are normal.  Eyes: Conjunctivae, EOM and lids are normal. Pupils are equal, round, and reactive to light. Right eye exhibits no discharge. Left eye exhibits no discharge. No scleral icterus.  Neck: Normal range of motion. Neck supple.  Cardiovascular: Normal rate, regular rhythm, normal heart sounds, intact distal pulses and normal pulses.   Pulmonary/Chest: Effort normal and breath sounds normal. No respiratory distress. He has no wheezes. He has no rales.  Abdominal: Soft. Normal appearance and bowel sounds are normal. He exhibits no distension and no mass. There is no  tenderness. There is no rigidity, no rebound and no guarding.  Musculoskeletal: Normal range of motion. He exhibits no edema or tenderness.  Neurological: He is alert and oriented to person, place, and time. No cranial nerve deficit or sensory deficit.  Right sided hemiparesis.  Skin: Skin is warm, dry and intact. No rash noted. He is not diaphoretic. No erythema. No pallor.  Psychiatric: He has a normal mood and affect. His speech is normal and behavior is normal.  Nursing note and vitals reviewed.   ED Course  Procedures (including critical care time)  Labs Review Labs Reviewed  BASIC METABOLIC PANEL - Abnormal; Notable for the following:    Glucose, Bld 188 (*)    Creatinine, Ser 1.46 (*)    Calcium 8.8 (*)  GFR calc non Af Amer 53 (*)    All other components within normal limits  URINALYSIS, ROUTINE W REFLEX MICROSCOPIC (NOT AT [facility]) - Abnormal; Notable for the following:    Glucose, UA >1000 (*)    All other components within normal limits  URINE MICROSCOPIC-ADD ON - Abnormal; Notable for the following:    Squamous Epithelial / LPF 0-5 (*)    All other components within normal limits  CBG MONITORING, ED - Abnormal; Notable for the following:    Glucose-Capillary 164 (*)    All other components within normal limits  CBG MONITORING, ED - Abnormal; Notable for the following:    Glucose-Capillary 223 (*)    All other components within normal limits  CBC WITH DIFFERENTIAL/PLATELET  TSH  I-STAT TROPOININ, ED  I-STAT CG4 LACTIC ACID, ED    Imaging Review Dg Chest 2 View  03/18/2015  CLINICAL DATA:  Acute onset of hypoglycemia. Left bundle branch block. Initial encounter. EXAM: CHEST  2 VIEW COMPARISON:  Chest radiograph performed 08/27/2014 FINDINGS: The lungs are well-aerated and clear. There is no evidence of focal opacification, pleural effusion or pneumothorax. The heart is mildly enlarged. No acute osseous abnormalities are seen. IMPRESSION: Mild cardiomegaly.  Lungs  remain grossly clear. Electronically Signed   By: Garald Balding M.D.   On: 03/18/2015 22:05     I have personally reviewed and evaluated these images and lab results as part of my medical decision-making.   EKG Interpretation   Date/Time:  Friday March 18 2015 20:32:54 EST Ventricular Rate:  63 PR Interval:  227 QRS Duration: 100 QT Interval:  445 QTC Calculation: 455 R Axis:   -38 Text Interpretation:  Sinus rhythm Prolonged PR interval Inferior infarct,  old Consider anterior infarct No significant change since last tracing  Confirmed by KNOTT MD, DANIEL ([id number]) on 03/18/2015 8:42:52 PM      MDM   Final diagnoses:  Elevated blood pressure  Hypoglycemia    55 year old male presents with hypoglycemia and hypertension. Denies dizziness, lightheadedness, headache, vision change, chest pain, shortness of breath, abdominal pain, N/V, numbness, new weakness, paresthesia.   Patient's temperature initially 96. Vital signs stable. Heart regular rate and rhythm. Lungs clear to auscultation bilaterally. Abdomen soft, nontender, nondistended. Patient has right-sided hemiparesis and speech difficulty s/p CVA, otherwise normal neuro exam with no focal deficit.   CBC negative for leukocytosis or anemia. BMP remarkable for creatinine 1.46. UA negative for infection. Lactic acid within normal limits. EKG sinus rhythm, heart rate 63. Troponin negative. Chest x-ray mild cardiomegaly, lungs remain grossly clear. CBG 164. TSH within normal limits.  Discussed findings with patient and family member. Repeat temperature 97.9. Doubt initial temperature was accurate, likely due to thermometer error. Patient is nontoxic and well-appearing, feel he is stable for discharge at this time. No hypoglycemia in the ED. Blood pressure prior to discharge 140s/90s. Patient to follow-up with PCP. Return precautions discussed. Patient and family member verbalize their understanding and are in agreement with  plan.  BP 143/91 mmHg  Pulse 67  Temp(Src) 97.9 F (36.6 C) (Rectal)  Resp 23  Ht 5' 11.5" (1.816 m)  Wt 131.09 kg  BMI 39.75 kg/m2  SpO2 96%    Marella Chimes, PA-C 03/19/15 0129  Leo Grosser, MD 03/19/15 [phone]

## 2015-03-18 NOTE — ED Notes (Signed)
Bed: WA06 Expected date:  Expected time:  Means of arrival:  Comments: EMS- hypoglycemia- room 6

## 2015-03-18 NOTE — ED Notes (Signed)
Patient presents to ED by EMS after being called out for low BS. BS was 43. EMS gave an amp of D50. BS increased to 119. Patient's EKG shows Left Bundle Branch Block, wife considered so wanted patient evaluated at ED.

## 2015-03-19 LAB — URINALYSIS, ROUTINE W REFLEX MICROSCOPIC
Bilirubin Urine: NEGATIVE
Glucose, UA: >1000 mg/dL — AB
Hgb urine dipstick: NEGATIVE
KETONES UR: NEGATIVE mg/dL
LEUKOCYTES UA: NEGATIVE
NITRITE: NEGATIVE
PH: 6 (ref 5.0–8.0)
PROTEIN: NEGATIVE mg/dL
Specific Gravity, Urine: 1.027 (ref 1.005–1.030)

## 2015-03-19 LAB — URINE MICROSCOPIC-ADD ON
Bacteria, UA: NONE SEEN
RBC / HPF: NONE SEEN RBC/hpf (ref 0–5)
WBC UA: NONE SEEN WBC/hpf (ref 0–5)

## 2015-03-19 LAB — CBG MONITORING, ED: GLUCOSE-CAPILLARY: 223 mg/dL — AB (ref 65–99)

## 2015-03-19 NOTE — Discharge Instructions (Signed)
1. Medications: usual home medications 2. Treatment: rest, drink plenty of fluids 3. Follow Up: please followup with your primary doctor for discussion of your diagnoses and further evaluation after today's visit; if you do not have a primary care doctor use the resource guide provided to find one; please return to the ER for new or worsening symptoms   Hypertension Hypertension, commonly called high blood pressure, is when the force of blood pumping through your arteries is too strong. Your arteries are the blood vessels that carry blood from your heart throughout your body. A blood pressure reading consists of a higher number over a lower number, such as 110/72. The higher number (systolic) is the pressure inside your arteries when your heart pumps. The lower number (diastolic) is the pressure inside your arteries when your heart relaxes. Ideally you want your blood pressure below 120/80. Hypertension forces your heart to work harder to pump blood. Your arteries may become narrow or stiff. Having untreated or uncontrolled hypertension can cause heart attack, stroke, kidney disease, and other problems. RISK FACTORS Some risk factors for high blood pressure are controllable. Others are not.  Risk factors you cannot control include:   Race. You may be at higher risk if you are African American.  Age. Risk increases with age.  Gender. Men are at higher risk than women before age 60 years. After age 30, women are at higher risk than men. Risk factors you can control include:  Not getting enough exercise or physical activity.  Being overweight.  Getting too much fat, sugar, calories, or salt in your diet.  Drinking too much alcohol. SIGNS AND SYMPTOMS Hypertension does not usually cause signs or symptoms. Extremely high blood pressure (hypertensive crisis) may cause headache, anxiety, shortness of breath, and nosebleed. DIAGNOSIS To check if you have hypertension, your health care provider  will measure your blood pressure while you are seated, with your arm held at the level of your heart. It should be measured at least twice using the same arm. Certain conditions can cause a difference in blood pressure between your right and left arms. A blood pressure reading that is higher than normal on one occasion does not mean that you need treatment. If it is not clear whether you have high blood pressure, you may be asked to return on a different day to have your blood pressure checked again. Or, you may be asked to monitor your blood pressure at home for 1 or more weeks. TREATMENT Treating high blood pressure includes making lifestyle changes and possibly taking medicine. Living a healthy lifestyle can help lower high blood pressure. You may need to change some of your habits. Lifestyle changes may include:  Following the DASH diet. This diet is high in fruits, vegetables, and whole grains. It is low in salt, red meat, and added sugars.  Keep your sodium intake below 2,300 mg per day.  Getting at least 30-45 minutes of aerobic exercise at least 4 times per week.  Losing weight if necessary.  Not smoking.  Limiting alcoholic beverages.  Learning ways to reduce stress. Your health care provider may prescribe medicine if lifestyle changes are not enough to get your blood pressure under control, and if one of the following is true:  You are 60-49 years of age and your systolic blood pressure is above 140.  You are 51 years of age or older, and your systolic blood pressure is above 150.  Your diastolic blood pressure is above 90.  You have  diabetes, and your systolic blood pressure is over 140 or your diastolic blood pressure is over 90.  You have kidney disease and your blood pressure is above 140/90.  You have heart disease and your blood pressure is above 140/90. Your personal target blood pressure may vary depending on your medical conditions, your age, and other factors. HOME  CARE INSTRUCTIONS  Have your blood pressure rechecked as directed by your health care provider.   Take medicines only as directed by your health care provider. Follow the directions carefully. Blood pressure medicines must be taken as prescribed. The medicine does not work as well when you skip doses. Skipping doses also puts you at risk for problems.  Do not smoke.   Monitor your blood pressure at home as directed by your health care provider. SEEK MEDICAL CARE IF:   You think you are having a reaction to medicines taken.  You have recurrent headaches or feel dizzy.  You have swelling in your ankles.  You have trouble with your vision. SEEK IMMEDIATE MEDICAL CARE IF:  You develop a severe headache or confusion.  You have unusual weakness, numbness, or feel faint.  You have severe chest or abdominal pain.  You vomit repeatedly.  You have trouble breathing. MAKE SURE YOU:   Understand these instructions.  Will watch your condition.  Will get help right away if you are not doing well or get worse.   This information is not intended to replace advice given to you by your health care provider. Make sure you discuss any questions you have with your health care provider.   Document Released: 01/22/2005 Document Revised: 06/08/2014 Document Reviewed: 11/14/2012 Elsevier Interactive Patient Education 2016 [employer]  Hypoglycemia Hypoglycemia occurs when the glucose in your blood is too low. Glucose is a type of sugar that is your body's main energy source. Hormones, such as insulin and glucagon, control the level of glucose in the blood. Insulin lowers blood glucose and glucagon increases blood glucose. Having too much insulin in your blood stream, or not eating enough food containing sugar, can result in hypoglycemia. Hypoglycemia can happen to people with or without diabetes. It can develop quickly and can be a medical emergency.  CAUSES   Missing or delaying  meals.  Not eating enough carbohydrates at meals.  Taking too much diabetes medicine.  Not timing your oral diabetes medicine or insulin doses with meals, snacks, and exercise.  Nausea and vomiting.  Certain medicines.  Severe illnesses, such as hepatitis, kidney disorders, and certain eating disorders.  Increased activity or exercise without eating something extra or adjusting medicines.  Drinking too much alcohol.  A nerve disorder that affects body functions like your heart rate, blood pressure, and digestion (autonomic neuropathy).  A condition where the stomach muscles do not function properly (gastroparesis). Therefore, medicines and food may not absorb properly.  Rarely, a tumor of the pancreas can produce too much insulin. SYMPTOMS   Hunger.  Sweating (diaphoresis).  Change in body temperature.  Shakiness.  Headache.  Anxiety.  Lightheadedness.  Irritability.  Difficulty concentrating.  Dry mouth.  Tingling or numbness in the hands or feet.  Restless sleep or sleep disturbances.  Altered speech and coordination.  Change in mental status.  Seizures or prolonged convulsions.  Combativeness.  Drowsiness (lethargic).  Weakness.  Increased heart rate or palpitations.  Confusion.  Pale, gray skin color.  Blurred or double vision.  Fainting. DIAGNOSIS  A physical exam and medical history will be performed. Your caregiver  may make a diagnosis based on your symptoms. Blood tests and other lab tests may be performed to confirm a diagnosis. Once the diagnosis is made, your caregiver will see if your signs and symptoms go away once your blood glucose is raised.  TREATMENT  Usually, you can easily treat your hypoglycemia when you notice symptoms.  Check your blood glucose. If it is less than 70 mg/dl, take one of the following:   3-4 glucose tablets.    cup juice.    cup regular soda.   1 cup skim milk.   -1 tube of glucose gel.    5-6 hard candies.   Avoid high-fat drinks or food that may delay a rise in blood glucose levels.  Do not take more than the recommended amount of sugary foods, drinks, gel, or tablets. Doing so will cause your blood glucose to go too high.   Wait 10-15 minutes and recheck your blood glucose. If it is still less than 70 mg/dl or below your target range, repeat treatment.   Eat a snack if it is more than 1 hour until your next meal.  There may be a time when your blood glucose may go so low that you are unable to treat yourself at home when you start to notice symptoms. You may need someone to help you. You may even faint or be unable to swallow. If you cannot treat yourself, someone will need to bring you to the hospital.  HOME CARE INSTRUCTIONS  If you have diabetes, follow your diabetes management plan by:  Taking your medicines as directed.  Following your exercise plan.  Following your meal plan. Do not skip meals. Eat on time.  Testing your blood glucose regularly. Check your blood glucose before and after exercise. If you exercise longer or different than usual, be sure to check blood glucose more frequently.  Wearing your medical alert jewelry that says you have diabetes.  Identify the cause of your hypoglycemia. Then, develop ways to prevent the recurrence of hypoglycemia.  Do not take a hot bath or shower right after an insulin shot.  Always carry treatment with you. Glucose tablets are the easiest to carry.  If you are going to drink alcohol, drink it only with meals.  Tell friends or family members ways to keep you safe during a seizure. This may include removing hard or sharp objects from the area or turning you on your side.  Maintain a healthy weight. SEEK MEDICAL CARE IF:   You are having problems keeping your blood glucose in your target range.  You are having frequent episodes of hypoglycemia.  You feel you might be having side effects from your  medicines.  You are not sure why your blood glucose is dropping so low.  You notice a change in vision or a new problem with your vision. SEEK IMMEDIATE MEDICAL CARE IF:   Confusion develops.  A change in mental status occurs.  The inability to swallow develops.  Fainting occurs.   This information is not intended to replace advice given to you by your health care provider. Make sure you discuss any questions you have with your health care provider.   Document Released: 01/22/2005 Document Revised: 01/27/2013 Document Reviewed: 09/28/2014 Elsevier Interactive Patient Education [employer]

## 2015-03-21 DIAGNOSIS — E1142 Type 2 diabetes mellitus with diabetic polyneuropathy: Secondary | ICD-10-CM | POA: Diagnosis not present

## 2015-03-21 DIAGNOSIS — I129 Hypertensive chronic kidney disease with stage 1 through stage 4 chronic kidney disease, or unspecified chronic kidney disease: Secondary | ICD-10-CM | POA: Diagnosis not present

## 2015-03-21 DIAGNOSIS — I69351 Hemiplegia and hemiparesis following cerebral infarction affecting right dominant side: Secondary | ICD-10-CM | POA: Diagnosis not present

## 2015-03-21 DIAGNOSIS — E1122 Type 2 diabetes mellitus with diabetic chronic kidney disease: Secondary | ICD-10-CM | POA: Diagnosis not present

## 2015-03-21 DIAGNOSIS — I69320 Aphasia following cerebral infarction: Secondary | ICD-10-CM | POA: Diagnosis not present

## 2015-03-21 DIAGNOSIS — N189 Chronic kidney disease, unspecified: Secondary | ICD-10-CM | POA: Diagnosis not present

## 2015-03-22 DIAGNOSIS — I129 Hypertensive chronic kidney disease with stage 1 through stage 4 chronic kidney disease, or unspecified chronic kidney disease: Secondary | ICD-10-CM | POA: Diagnosis not present

## 2015-03-22 DIAGNOSIS — I69351 Hemiplegia and hemiparesis following cerebral infarction affecting right dominant side: Secondary | ICD-10-CM | POA: Diagnosis not present

## 2015-03-22 DIAGNOSIS — E1142 Type 2 diabetes mellitus with diabetic polyneuropathy: Secondary | ICD-10-CM | POA: Diagnosis not present

## 2015-03-22 DIAGNOSIS — I69320 Aphasia following cerebral infarction: Secondary | ICD-10-CM | POA: Diagnosis not present

## 2015-03-22 DIAGNOSIS — N189 Chronic kidney disease, unspecified: Secondary | ICD-10-CM | POA: Diagnosis not present

## 2015-03-22 DIAGNOSIS — E1122 Type 2 diabetes mellitus with diabetic chronic kidney disease: Secondary | ICD-10-CM | POA: Diagnosis not present

## 2015-03-23 DIAGNOSIS — N189 Chronic kidney disease, unspecified: Secondary | ICD-10-CM | POA: Diagnosis not present

## 2015-03-23 DIAGNOSIS — L602 Onychogryphosis: Secondary | ICD-10-CM | POA: Diagnosis not present

## 2015-03-23 DIAGNOSIS — M216X2 Other acquired deformities of left foot: Secondary | ICD-10-CM | POA: Diagnosis not present

## 2015-03-23 DIAGNOSIS — L84 Corns and callosities: Secondary | ICD-10-CM | POA: Diagnosis not present

## 2015-03-23 DIAGNOSIS — I70293 Other atherosclerosis of native arteries of extremities, bilateral legs: Secondary | ICD-10-CM | POA: Diagnosis not present

## 2015-03-23 DIAGNOSIS — E1122 Type 2 diabetes mellitus with diabetic chronic kidney disease: Secondary | ICD-10-CM | POA: Diagnosis not present

## 2015-03-23 DIAGNOSIS — M216X1 Other acquired deformities of right foot: Secondary | ICD-10-CM | POA: Diagnosis not present

## 2015-03-23 DIAGNOSIS — I129 Hypertensive chronic kidney disease with stage 1 through stage 4 chronic kidney disease, or unspecified chronic kidney disease: Secondary | ICD-10-CM | POA: Diagnosis not present

## 2015-03-23 DIAGNOSIS — I69351 Hemiplegia and hemiparesis following cerebral infarction affecting right dominant side: Secondary | ICD-10-CM | POA: Diagnosis not present

## 2015-03-23 DIAGNOSIS — E1142 Type 2 diabetes mellitus with diabetic polyneuropathy: Secondary | ICD-10-CM | POA: Diagnosis not present

## 2015-03-23 DIAGNOSIS — I69320 Aphasia following cerebral infarction: Secondary | ICD-10-CM | POA: Diagnosis not present

## 2015-03-23 DIAGNOSIS — E1351 Other specified diabetes mellitus with diabetic peripheral angiopathy without gangrene: Secondary | ICD-10-CM | POA: Diagnosis not present

## 2015-03-24 DIAGNOSIS — E1142 Type 2 diabetes mellitus with diabetic polyneuropathy: Secondary | ICD-10-CM | POA: Diagnosis not present

## 2015-03-24 DIAGNOSIS — I69320 Aphasia following cerebral infarction: Secondary | ICD-10-CM | POA: Diagnosis not present

## 2015-03-24 DIAGNOSIS — I129 Hypertensive chronic kidney disease with stage 1 through stage 4 chronic kidney disease, or unspecified chronic kidney disease: Secondary | ICD-10-CM | POA: Diagnosis not present

## 2015-03-24 DIAGNOSIS — N189 Chronic kidney disease, unspecified: Secondary | ICD-10-CM | POA: Diagnosis not present

## 2015-03-24 DIAGNOSIS — I69351 Hemiplegia and hemiparesis following cerebral infarction affecting right dominant side: Secondary | ICD-10-CM | POA: Diagnosis not present

## 2015-03-24 DIAGNOSIS — E1122 Type 2 diabetes mellitus with diabetic chronic kidney disease: Secondary | ICD-10-CM | POA: Diagnosis not present

## 2015-03-25 DIAGNOSIS — E1122 Type 2 diabetes mellitus with diabetic chronic kidney disease: Secondary | ICD-10-CM | POA: Diagnosis not present

## 2015-03-25 DIAGNOSIS — I129 Hypertensive chronic kidney disease with stage 1 through stage 4 chronic kidney disease, or unspecified chronic kidney disease: Secondary | ICD-10-CM | POA: Diagnosis not present

## 2015-03-25 DIAGNOSIS — I69320 Aphasia following cerebral infarction: Secondary | ICD-10-CM | POA: Diagnosis not present

## 2015-03-25 DIAGNOSIS — E1142 Type 2 diabetes mellitus with diabetic polyneuropathy: Secondary | ICD-10-CM | POA: Diagnosis not present

## 2015-03-25 DIAGNOSIS — I69351 Hemiplegia and hemiparesis following cerebral infarction affecting right dominant side: Secondary | ICD-10-CM | POA: Diagnosis not present

## 2015-03-25 DIAGNOSIS — N189 Chronic kidney disease, unspecified: Secondary | ICD-10-CM | POA: Diagnosis not present

## 2015-03-28 DIAGNOSIS — N189 Chronic kidney disease, unspecified: Secondary | ICD-10-CM | POA: Diagnosis not present

## 2015-03-28 DIAGNOSIS — I129 Hypertensive chronic kidney disease with stage 1 through stage 4 chronic kidney disease, or unspecified chronic kidney disease: Secondary | ICD-10-CM | POA: Diagnosis not present

## 2015-03-28 DIAGNOSIS — I69320 Aphasia following cerebral infarction: Secondary | ICD-10-CM | POA: Diagnosis not present

## 2015-03-28 DIAGNOSIS — E1122 Type 2 diabetes mellitus with diabetic chronic kidney disease: Secondary | ICD-10-CM | POA: Diagnosis not present

## 2015-03-28 DIAGNOSIS — I69351 Hemiplegia and hemiparesis following cerebral infarction affecting right dominant side: Secondary | ICD-10-CM | POA: Diagnosis not present

## 2015-03-28 DIAGNOSIS — E1142 Type 2 diabetes mellitus with diabetic polyneuropathy: Secondary | ICD-10-CM | POA: Diagnosis not present

## 2015-03-29 DIAGNOSIS — I69320 Aphasia following cerebral infarction: Secondary | ICD-10-CM | POA: Diagnosis not present

## 2015-03-29 DIAGNOSIS — I69351 Hemiplegia and hemiparesis following cerebral infarction affecting right dominant side: Secondary | ICD-10-CM | POA: Diagnosis not present

## 2015-03-29 DIAGNOSIS — N189 Chronic kidney disease, unspecified: Secondary | ICD-10-CM | POA: Diagnosis not present

## 2015-03-29 DIAGNOSIS — E1142 Type 2 diabetes mellitus with diabetic polyneuropathy: Secondary | ICD-10-CM | POA: Diagnosis not present

## 2015-03-29 DIAGNOSIS — E1122 Type 2 diabetes mellitus with diabetic chronic kidney disease: Secondary | ICD-10-CM | POA: Diagnosis not present

## 2015-03-29 DIAGNOSIS — I129 Hypertensive chronic kidney disease with stage 1 through stage 4 chronic kidney disease, or unspecified chronic kidney disease: Secondary | ICD-10-CM | POA: Diagnosis not present

## 2015-03-30 DIAGNOSIS — N189 Chronic kidney disease, unspecified: Secondary | ICD-10-CM | POA: Diagnosis not present

## 2015-03-30 DIAGNOSIS — E1122 Type 2 diabetes mellitus with diabetic chronic kidney disease: Secondary | ICD-10-CM | POA: Diagnosis not present

## 2015-03-30 DIAGNOSIS — I69320 Aphasia following cerebral infarction: Secondary | ICD-10-CM | POA: Diagnosis not present

## 2015-03-30 DIAGNOSIS — E1142 Type 2 diabetes mellitus with diabetic polyneuropathy: Secondary | ICD-10-CM | POA: Diagnosis not present

## 2015-03-30 DIAGNOSIS — I69351 Hemiplegia and hemiparesis following cerebral infarction affecting right dominant side: Secondary | ICD-10-CM | POA: Diagnosis not present

## 2015-03-30 DIAGNOSIS — I129 Hypertensive chronic kidney disease with stage 1 through stage 4 chronic kidney disease, or unspecified chronic kidney disease: Secondary | ICD-10-CM | POA: Diagnosis not present

## 2015-04-01 DIAGNOSIS — I69320 Aphasia following cerebral infarction: Secondary | ICD-10-CM | POA: Diagnosis not present

## 2015-04-01 DIAGNOSIS — I69351 Hemiplegia and hemiparesis following cerebral infarction affecting right dominant side: Secondary | ICD-10-CM | POA: Diagnosis not present

## 2015-04-01 DIAGNOSIS — E1122 Type 2 diabetes mellitus with diabetic chronic kidney disease: Secondary | ICD-10-CM | POA: Diagnosis not present

## 2015-04-01 DIAGNOSIS — I129 Hypertensive chronic kidney disease with stage 1 through stage 4 chronic kidney disease, or unspecified chronic kidney disease: Secondary | ICD-10-CM | POA: Diagnosis not present

## 2015-04-01 DIAGNOSIS — N189 Chronic kidney disease, unspecified: Secondary | ICD-10-CM | POA: Diagnosis not present

## 2015-04-01 DIAGNOSIS — E1142 Type 2 diabetes mellitus with diabetic polyneuropathy: Secondary | ICD-10-CM | POA: Diagnosis not present

## 2015-04-05 DIAGNOSIS — N189 Chronic kidney disease, unspecified: Secondary | ICD-10-CM | POA: Diagnosis not present

## 2015-04-05 DIAGNOSIS — I69351 Hemiplegia and hemiparesis following cerebral infarction affecting right dominant side: Secondary | ICD-10-CM | POA: Diagnosis not present

## 2015-04-05 DIAGNOSIS — I69320 Aphasia following cerebral infarction: Secondary | ICD-10-CM | POA: Diagnosis not present

## 2015-04-05 DIAGNOSIS — E1122 Type 2 diabetes mellitus with diabetic chronic kidney disease: Secondary | ICD-10-CM | POA: Diagnosis not present

## 2015-04-05 DIAGNOSIS — I129 Hypertensive chronic kidney disease with stage 1 through stage 4 chronic kidney disease, or unspecified chronic kidney disease: Secondary | ICD-10-CM | POA: Diagnosis not present

## 2015-04-05 DIAGNOSIS — E1142 Type 2 diabetes mellitus with diabetic polyneuropathy: Secondary | ICD-10-CM | POA: Diagnosis not present

## 2015-04-06 DIAGNOSIS — I69320 Aphasia following cerebral infarction: Secondary | ICD-10-CM | POA: Diagnosis not present

## 2015-04-06 DIAGNOSIS — I129 Hypertensive chronic kidney disease with stage 1 through stage 4 chronic kidney disease, or unspecified chronic kidney disease: Secondary | ICD-10-CM | POA: Diagnosis not present

## 2015-04-06 DIAGNOSIS — N189 Chronic kidney disease, unspecified: Secondary | ICD-10-CM | POA: Diagnosis not present

## 2015-04-06 DIAGNOSIS — I69351 Hemiplegia and hemiparesis following cerebral infarction affecting right dominant side: Secondary | ICD-10-CM | POA: Diagnosis not present

## 2015-04-06 DIAGNOSIS — E1122 Type 2 diabetes mellitus with diabetic chronic kidney disease: Secondary | ICD-10-CM | POA: Diagnosis not present

## 2015-04-06 DIAGNOSIS — E1142 Type 2 diabetes mellitus with diabetic polyneuropathy: Secondary | ICD-10-CM | POA: Diagnosis not present

## 2015-04-07 DIAGNOSIS — N189 Chronic kidney disease, unspecified: Secondary | ICD-10-CM | POA: Diagnosis not present

## 2015-04-07 DIAGNOSIS — I69351 Hemiplegia and hemiparesis following cerebral infarction affecting right dominant side: Secondary | ICD-10-CM | POA: Diagnosis not present

## 2015-04-07 DIAGNOSIS — I69320 Aphasia following cerebral infarction: Secondary | ICD-10-CM | POA: Diagnosis not present

## 2015-04-07 DIAGNOSIS — E1142 Type 2 diabetes mellitus with diabetic polyneuropathy: Secondary | ICD-10-CM | POA: Diagnosis not present

## 2015-04-07 DIAGNOSIS — E1122 Type 2 diabetes mellitus with diabetic chronic kidney disease: Secondary | ICD-10-CM | POA: Diagnosis not present

## 2015-04-07 DIAGNOSIS — I129 Hypertensive chronic kidney disease with stage 1 through stage 4 chronic kidney disease, or unspecified chronic kidney disease: Secondary | ICD-10-CM | POA: Diagnosis not present

## 2015-04-08 DIAGNOSIS — E1142 Type 2 diabetes mellitus with diabetic polyneuropathy: Secondary | ICD-10-CM | POA: Diagnosis not present

## 2015-04-08 DIAGNOSIS — N189 Chronic kidney disease, unspecified: Secondary | ICD-10-CM | POA: Diagnosis not present

## 2015-04-08 DIAGNOSIS — I69351 Hemiplegia and hemiparesis following cerebral infarction affecting right dominant side: Secondary | ICD-10-CM | POA: Diagnosis not present

## 2015-04-08 DIAGNOSIS — I69320 Aphasia following cerebral infarction: Secondary | ICD-10-CM | POA: Diagnosis not present

## 2015-04-08 DIAGNOSIS — I129 Hypertensive chronic kidney disease with stage 1 through stage 4 chronic kidney disease, or unspecified chronic kidney disease: Secondary | ICD-10-CM | POA: Diagnosis not present

## 2015-04-08 DIAGNOSIS — E1122 Type 2 diabetes mellitus with diabetic chronic kidney disease: Secondary | ICD-10-CM | POA: Diagnosis not present

## 2015-04-11 ENCOUNTER — Other Ambulatory Visit: Payer: Self-pay | Admitting: Internal Medicine

## 2015-04-11 DIAGNOSIS — E1142 Type 2 diabetes mellitus with diabetic polyneuropathy: Secondary | ICD-10-CM | POA: Diagnosis not present

## 2015-04-11 DIAGNOSIS — E1122 Type 2 diabetes mellitus with diabetic chronic kidney disease: Secondary | ICD-10-CM | POA: Diagnosis not present

## 2015-04-11 DIAGNOSIS — I129 Hypertensive chronic kidney disease with stage 1 through stage 4 chronic kidney disease, or unspecified chronic kidney disease: Secondary | ICD-10-CM | POA: Diagnosis not present

## 2015-04-11 DIAGNOSIS — I69351 Hemiplegia and hemiparesis following cerebral infarction affecting right dominant side: Secondary | ICD-10-CM | POA: Diagnosis not present

## 2015-04-11 DIAGNOSIS — I69320 Aphasia following cerebral infarction: Secondary | ICD-10-CM | POA: Diagnosis not present

## 2015-04-11 DIAGNOSIS — N189 Chronic kidney disease, unspecified: Secondary | ICD-10-CM | POA: Diagnosis not present

## 2015-04-12 DIAGNOSIS — I69351 Hemiplegia and hemiparesis following cerebral infarction affecting right dominant side: Secondary | ICD-10-CM | POA: Diagnosis not present

## 2015-04-12 DIAGNOSIS — N189 Chronic kidney disease, unspecified: Secondary | ICD-10-CM | POA: Diagnosis not present

## 2015-04-12 DIAGNOSIS — E1122 Type 2 diabetes mellitus with diabetic chronic kidney disease: Secondary | ICD-10-CM | POA: Diagnosis not present

## 2015-04-12 DIAGNOSIS — E1142 Type 2 diabetes mellitus with diabetic polyneuropathy: Secondary | ICD-10-CM | POA: Diagnosis not present

## 2015-04-12 DIAGNOSIS — I69320 Aphasia following cerebral infarction: Secondary | ICD-10-CM | POA: Diagnosis not present

## 2015-04-12 DIAGNOSIS — I129 Hypertensive chronic kidney disease with stage 1 through stage 4 chronic kidney disease, or unspecified chronic kidney disease: Secondary | ICD-10-CM | POA: Diagnosis not present

## 2015-04-14 DIAGNOSIS — I69351 Hemiplegia and hemiparesis following cerebral infarction affecting right dominant side: Secondary | ICD-10-CM | POA: Diagnosis not present

## 2015-04-14 DIAGNOSIS — N189 Chronic kidney disease, unspecified: Secondary | ICD-10-CM | POA: Diagnosis not present

## 2015-04-14 DIAGNOSIS — I69320 Aphasia following cerebral infarction: Secondary | ICD-10-CM | POA: Diagnosis not present

## 2015-04-14 DIAGNOSIS — I129 Hypertensive chronic kidney disease with stage 1 through stage 4 chronic kidney disease, or unspecified chronic kidney disease: Secondary | ICD-10-CM | POA: Diagnosis not present

## 2015-04-14 DIAGNOSIS — E1142 Type 2 diabetes mellitus with diabetic polyneuropathy: Secondary | ICD-10-CM | POA: Diagnosis not present

## 2015-04-14 DIAGNOSIS — E1122 Type 2 diabetes mellitus with diabetic chronic kidney disease: Secondary | ICD-10-CM | POA: Diagnosis not present

## 2015-04-15 DIAGNOSIS — E1142 Type 2 diabetes mellitus with diabetic polyneuropathy: Secondary | ICD-10-CM | POA: Diagnosis not present

## 2015-04-15 DIAGNOSIS — I69351 Hemiplegia and hemiparesis following cerebral infarction affecting right dominant side: Secondary | ICD-10-CM | POA: Diagnosis not present

## 2015-04-15 DIAGNOSIS — I129 Hypertensive chronic kidney disease with stage 1 through stage 4 chronic kidney disease, or unspecified chronic kidney disease: Secondary | ICD-10-CM | POA: Diagnosis not present

## 2015-04-15 DIAGNOSIS — E1122 Type 2 diabetes mellitus with diabetic chronic kidney disease: Secondary | ICD-10-CM | POA: Diagnosis not present

## 2015-04-15 DIAGNOSIS — N189 Chronic kidney disease, unspecified: Secondary | ICD-10-CM | POA: Diagnosis not present

## 2015-04-15 DIAGNOSIS — I69320 Aphasia following cerebral infarction: Secondary | ICD-10-CM | POA: Diagnosis not present

## 2015-04-18 DIAGNOSIS — I69320 Aphasia following cerebral infarction: Secondary | ICD-10-CM | POA: Diagnosis not present

## 2015-04-18 DIAGNOSIS — E1142 Type 2 diabetes mellitus with diabetic polyneuropathy: Secondary | ICD-10-CM | POA: Diagnosis not present

## 2015-04-18 DIAGNOSIS — I69351 Hemiplegia and hemiparesis following cerebral infarction affecting right dominant side: Secondary | ICD-10-CM | POA: Diagnosis not present

## 2015-04-18 DIAGNOSIS — N189 Chronic kidney disease, unspecified: Secondary | ICD-10-CM | POA: Diagnosis not present

## 2015-04-18 DIAGNOSIS — I129 Hypertensive chronic kidney disease with stage 1 through stage 4 chronic kidney disease, or unspecified chronic kidney disease: Secondary | ICD-10-CM | POA: Diagnosis not present

## 2015-04-18 DIAGNOSIS — E1122 Type 2 diabetes mellitus with diabetic chronic kidney disease: Secondary | ICD-10-CM | POA: Diagnosis not present

## 2015-04-26 DIAGNOSIS — I69320 Aphasia following cerebral infarction: Secondary | ICD-10-CM | POA: Diagnosis not present

## 2015-04-26 DIAGNOSIS — E1142 Type 2 diabetes mellitus with diabetic polyneuropathy: Secondary | ICD-10-CM | POA: Diagnosis not present

## 2015-04-26 DIAGNOSIS — E1122 Type 2 diabetes mellitus with diabetic chronic kidney disease: Secondary | ICD-10-CM | POA: Diagnosis not present

## 2015-04-26 DIAGNOSIS — N189 Chronic kidney disease, unspecified: Secondary | ICD-10-CM | POA: Diagnosis not present

## 2015-04-26 DIAGNOSIS — I129 Hypertensive chronic kidney disease with stage 1 through stage 4 chronic kidney disease, or unspecified chronic kidney disease: Secondary | ICD-10-CM | POA: Diagnosis not present

## 2015-04-26 DIAGNOSIS — I69351 Hemiplegia and hemiparesis following cerebral infarction affecting right dominant side: Secondary | ICD-10-CM | POA: Diagnosis not present

## 2015-05-16 ENCOUNTER — Ambulatory Visit: Payer: Medicare Other | Attending: Family Medicine

## 2015-05-16 ENCOUNTER — Ambulatory Visit: Payer: Medicare Other | Admitting: Occupational Therapy

## 2015-05-23 ENCOUNTER — Ambulatory Visit: Payer: Medicare Other

## 2015-05-23 ENCOUNTER — Other Ambulatory Visit: Payer: Self-pay | Admitting: Pharmacist

## 2015-05-23 DIAGNOSIS — E119 Type 2 diabetes mellitus without complications: Secondary | ICD-10-CM

## 2015-05-23 MED ORDER — LANCETS 28G MISC: Status: AC

## 2015-05-23 MED ORDER — GLUCOSE BLOOD VI STRP: ORAL_STRIP | Status: AC

## 2015-07-12 DIAGNOSIS — F419 Anxiety disorder, unspecified: Secondary | ICD-10-CM | POA: Diagnosis not present

## 2015-07-12 DIAGNOSIS — N183 Chronic kidney disease, stage 3 (moderate): Secondary | ICD-10-CM | POA: Diagnosis not present

## 2015-07-12 DIAGNOSIS — J301 Allergic rhinitis due to pollen: Secondary | ICD-10-CM | POA: Diagnosis not present

## 2015-07-12 DIAGNOSIS — I10 Essential (primary) hypertension: Secondary | ICD-10-CM | POA: Diagnosis not present

## 2015-07-12 DIAGNOSIS — E1165 Type 2 diabetes mellitus with hyperglycemia: Secondary | ICD-10-CM | POA: Diagnosis not present

## 2015-07-12 DIAGNOSIS — I679 Cerebrovascular disease, unspecified: Secondary | ICD-10-CM | POA: Diagnosis not present

## 2015-07-12 DIAGNOSIS — E7800 Pure hypercholesterolemia, unspecified: Secondary | ICD-10-CM | POA: Diagnosis not present

## 2015-07-25 ENCOUNTER — Other Ambulatory Visit: Payer: Self-pay | Admitting: Internal Medicine

## 2015-08-11 ENCOUNTER — Other Ambulatory Visit: Payer: Self-pay | Admitting: Internal Medicine

## 2015-08-24 ENCOUNTER — Other Ambulatory Visit: Payer: Self-pay | Admitting: Internal Medicine

## 2015-08-25 NOTE — Telephone Encounter (Signed)
Refill Novolog pen

## 2015-08-25 NOTE — Telephone Encounter (Signed)
Refill Hydrlazine

## 2015-09-21 ENCOUNTER — Other Ambulatory Visit: Payer: Self-pay | Admitting: Internal Medicine

## 2015-09-21 DIAGNOSIS — E785 Hyperlipidemia, unspecified: Secondary | ICD-10-CM

## 2015-09-22 ENCOUNTER — Other Ambulatory Visit: Payer: Self-pay | Admitting: Internal Medicine

## 2015-09-22 DIAGNOSIS — I10 Essential (primary) hypertension: Secondary | ICD-10-CM

## 2015-10-21 ENCOUNTER — Other Ambulatory Visit: Payer: Self-pay | Admitting: Internal Medicine

## 2015-11-28 DIAGNOSIS — E1165 Type 2 diabetes mellitus with hyperglycemia: Secondary | ICD-10-CM | POA: Diagnosis not present

## 2015-11-28 DIAGNOSIS — Z6841 Body Mass Index (BMI) 40.0 and over, adult: Secondary | ICD-10-CM | POA: Diagnosis not present

## 2015-11-28 DIAGNOSIS — Z8673 Personal history of transient ischemic attack (TIA), and cerebral infarction without residual deficits: Secondary | ICD-10-CM | POA: Diagnosis not present

## 2015-11-28 DIAGNOSIS — Z794 Long term (current) use of insulin: Secondary | ICD-10-CM | POA: Diagnosis not present

## 2015-11-28 DIAGNOSIS — E785 Hyperlipidemia, unspecified: Secondary | ICD-10-CM | POA: Diagnosis not present

## 2015-11-28 DIAGNOSIS — I10 Essential (primary) hypertension: Secondary | ICD-10-CM | POA: Diagnosis not present

## 2015-12-02 DIAGNOSIS — Z794 Long term (current) use of insulin: Secondary | ICD-10-CM | POA: Diagnosis not present

## 2015-12-02 DIAGNOSIS — E118 Type 2 diabetes mellitus with unspecified complications: Secondary | ICD-10-CM | POA: Diagnosis not present

## 2015-12-02 DIAGNOSIS — E1165 Type 2 diabetes mellitus with hyperglycemia: Secondary | ICD-10-CM | POA: Diagnosis not present

## 2015-12-07 DIAGNOSIS — I69320 Aphasia following cerebral infarction: Secondary | ICD-10-CM | POA: Diagnosis not present

## 2015-12-07 DIAGNOSIS — Z794 Long term (current) use of insulin: Secondary | ICD-10-CM | POA: Diagnosis not present

## 2015-12-07 DIAGNOSIS — E1165 Type 2 diabetes mellitus with hyperglycemia: Secondary | ICD-10-CM | POA: Diagnosis not present

## 2015-12-07 DIAGNOSIS — I69033 Monoplegia of upper limb following nontraumatic subarachnoid hemorrhage affecting right non-dominant side: Secondary | ICD-10-CM | POA: Diagnosis not present

## 2015-12-07 DIAGNOSIS — Z6839 Body mass index (BMI) 39.0-39.9, adult: Secondary | ICD-10-CM | POA: Diagnosis not present

## 2015-12-07 DIAGNOSIS — I10 Essential (primary) hypertension: Secondary | ICD-10-CM | POA: Diagnosis not present

## 2015-12-21 DIAGNOSIS — I69320 Aphasia following cerebral infarction: Secondary | ICD-10-CM | POA: Diagnosis not present

## 2015-12-21 DIAGNOSIS — R269 Unspecified abnormalities of gait and mobility: Secondary | ICD-10-CM | POA: Diagnosis not present

## 2015-12-21 DIAGNOSIS — R278 Other lack of coordination: Secondary | ICD-10-CM | POA: Diagnosis not present

## 2015-12-21 DIAGNOSIS — R6889 Other general symptoms and signs: Secondary | ICD-10-CM | POA: Diagnosis not present

## 2015-12-21 DIAGNOSIS — I639 Cerebral infarction, unspecified: Secondary | ICD-10-CM | POA: Diagnosis not present

## 2015-12-21 DIAGNOSIS — R4701 Aphasia: Secondary | ICD-10-CM | POA: Diagnosis not present

## 2015-12-21 DIAGNOSIS — M6281 Muscle weakness (generalized): Secondary | ICD-10-CM | POA: Diagnosis not present

## 2015-12-21 DIAGNOSIS — R2689 Other abnormalities of gait and mobility: Secondary | ICD-10-CM | POA: Diagnosis not present

## 2015-12-30 DIAGNOSIS — M6281 Muscle weakness (generalized): Secondary | ICD-10-CM | POA: Diagnosis not present

## 2015-12-30 DIAGNOSIS — R2689 Other abnormalities of gait and mobility: Secondary | ICD-10-CM | POA: Diagnosis not present

## 2015-12-30 DIAGNOSIS — R278 Other lack of coordination: Secondary | ICD-10-CM | POA: Diagnosis not present

## 2015-12-30 DIAGNOSIS — I69320 Aphasia following cerebral infarction: Secondary | ICD-10-CM | POA: Diagnosis not present

## 2015-12-30 DIAGNOSIS — R6889 Other general symptoms and signs: Secondary | ICD-10-CM | POA: Diagnosis not present

## 2015-12-30 DIAGNOSIS — R269 Unspecified abnormalities of gait and mobility: Secondary | ICD-10-CM | POA: Diagnosis not present

## 2016-01-04 DIAGNOSIS — M6281 Muscle weakness (generalized): Secondary | ICD-10-CM | POA: Diagnosis not present

## 2016-01-04 DIAGNOSIS — I69320 Aphasia following cerebral infarction: Secondary | ICD-10-CM | POA: Diagnosis not present

## 2016-01-04 DIAGNOSIS — R278 Other lack of coordination: Secondary | ICD-10-CM | POA: Diagnosis not present

## 2016-01-04 DIAGNOSIS — R6889 Other general symptoms and signs: Secondary | ICD-10-CM | POA: Diagnosis not present

## 2016-01-04 DIAGNOSIS — R269 Unspecified abnormalities of gait and mobility: Secondary | ICD-10-CM | POA: Diagnosis not present

## 2016-01-04 DIAGNOSIS — R2689 Other abnormalities of gait and mobility: Secondary | ICD-10-CM | POA: Diagnosis not present

## 2016-01-05 DIAGNOSIS — R2689 Other abnormalities of gait and mobility: Secondary | ICD-10-CM | POA: Diagnosis not present

## 2016-01-05 DIAGNOSIS — R6889 Other general symptoms and signs: Secondary | ICD-10-CM | POA: Diagnosis not present

## 2016-01-05 DIAGNOSIS — M6281 Muscle weakness (generalized): Secondary | ICD-10-CM | POA: Diagnosis not present

## 2016-01-05 DIAGNOSIS — R269 Unspecified abnormalities of gait and mobility: Secondary | ICD-10-CM | POA: Diagnosis not present

## 2016-01-05 DIAGNOSIS — I69320 Aphasia following cerebral infarction: Secondary | ICD-10-CM | POA: Diagnosis not present

## 2016-01-05 DIAGNOSIS — R278 Other lack of coordination: Secondary | ICD-10-CM | POA: Diagnosis not present

## 2016-01-09 DIAGNOSIS — I69320 Aphasia following cerebral infarction: Secondary | ICD-10-CM | POA: Diagnosis not present

## 2016-01-09 DIAGNOSIS — R269 Unspecified abnormalities of gait and mobility: Secondary | ICD-10-CM | POA: Diagnosis not present

## 2016-01-09 DIAGNOSIS — R4701 Aphasia: Secondary | ICD-10-CM | POA: Diagnosis not present

## 2016-01-09 DIAGNOSIS — R6889 Other general symptoms and signs: Secondary | ICD-10-CM | POA: Diagnosis not present

## 2016-01-09 DIAGNOSIS — R2689 Other abnormalities of gait and mobility: Secondary | ICD-10-CM | POA: Diagnosis not present

## 2016-01-09 DIAGNOSIS — R278 Other lack of coordination: Secondary | ICD-10-CM | POA: Diagnosis not present

## 2016-01-09 DIAGNOSIS — I639 Cerebral infarction, unspecified: Secondary | ICD-10-CM | POA: Diagnosis not present

## 2016-01-09 DIAGNOSIS — M6281 Muscle weakness (generalized): Secondary | ICD-10-CM | POA: Diagnosis not present

## 2016-01-12 DIAGNOSIS — M6281 Muscle weakness (generalized): Secondary | ICD-10-CM | POA: Diagnosis not present

## 2016-01-12 DIAGNOSIS — R269 Unspecified abnormalities of gait and mobility: Secondary | ICD-10-CM | POA: Diagnosis not present

## 2016-01-12 DIAGNOSIS — R6889 Other general symptoms and signs: Secondary | ICD-10-CM | POA: Diagnosis not present

## 2016-01-12 DIAGNOSIS — R278 Other lack of coordination: Secondary | ICD-10-CM | POA: Diagnosis not present

## 2016-01-12 DIAGNOSIS — R2689 Other abnormalities of gait and mobility: Secondary | ICD-10-CM | POA: Diagnosis not present

## 2016-01-12 DIAGNOSIS — I69320 Aphasia following cerebral infarction: Secondary | ICD-10-CM | POA: Diagnosis not present

## 2016-01-16 DIAGNOSIS — E1165 Type 2 diabetes mellitus with hyperglycemia: Secondary | ICD-10-CM | POA: Diagnosis not present

## 2016-01-16 DIAGNOSIS — Z794 Long term (current) use of insulin: Secondary | ICD-10-CM | POA: Diagnosis not present

## 2016-01-16 DIAGNOSIS — E1122 Type 2 diabetes mellitus with diabetic chronic kidney disease: Secondary | ICD-10-CM | POA: Diagnosis not present

## 2016-01-16 DIAGNOSIS — I10 Essential (primary) hypertension: Secondary | ICD-10-CM | POA: Diagnosis not present

## 2016-01-16 DIAGNOSIS — G4733 Obstructive sleep apnea (adult) (pediatric): Secondary | ICD-10-CM | POA: Diagnosis present

## 2016-01-16 DIAGNOSIS — N183 Chronic kidney disease, stage 3 (moderate): Secondary | ICD-10-CM | POA: Diagnosis not present

## 2016-01-16 DIAGNOSIS — R05 Cough: Secondary | ICD-10-CM | POA: Diagnosis not present

## 2016-01-16 DIAGNOSIS — Z6841 Body Mass Index (BMI) 40.0 and over, adult: Secondary | ICD-10-CM | POA: Diagnosis not present

## 2016-01-19 DIAGNOSIS — I10 Essential (primary) hypertension: Secondary | ICD-10-CM | POA: Diagnosis not present

## 2016-01-20 DIAGNOSIS — I69320 Aphasia following cerebral infarction: Secondary | ICD-10-CM | POA: Diagnosis not present

## 2016-01-20 DIAGNOSIS — R6889 Other general symptoms and signs: Secondary | ICD-10-CM | POA: Diagnosis not present

## 2016-01-20 DIAGNOSIS — M6281 Muscle weakness (generalized): Secondary | ICD-10-CM | POA: Diagnosis not present

## 2016-01-20 DIAGNOSIS — R278 Other lack of coordination: Secondary | ICD-10-CM | POA: Diagnosis not present

## 2016-01-20 DIAGNOSIS — R2689 Other abnormalities of gait and mobility: Secondary | ICD-10-CM | POA: Diagnosis not present

## 2016-01-20 DIAGNOSIS — R269 Unspecified abnormalities of gait and mobility: Secondary | ICD-10-CM | POA: Diagnosis not present

## 2016-01-24 DIAGNOSIS — M2041 Other hammer toe(s) (acquired), right foot: Secondary | ICD-10-CM | POA: Diagnosis not present

## 2016-01-24 DIAGNOSIS — I70203 Unspecified atherosclerosis of native arteries of extremities, bilateral legs: Secondary | ICD-10-CM | POA: Diagnosis not present

## 2016-01-24 DIAGNOSIS — M21371 Foot drop, right foot: Secondary | ICD-10-CM | POA: Diagnosis not present

## 2016-01-24 DIAGNOSIS — L603 Nail dystrophy: Secondary | ICD-10-CM | POA: Diagnosis not present

## 2016-01-24 DIAGNOSIS — B351 Tinea unguium: Secondary | ICD-10-CM | POA: Diagnosis not present

## 2016-01-24 DIAGNOSIS — E1142 Type 2 diabetes mellitus with diabetic polyneuropathy: Secondary | ICD-10-CM | POA: Diagnosis not present

## 2016-01-24 DIAGNOSIS — M2042 Other hammer toe(s) (acquired), left foot: Secondary | ICD-10-CM | POA: Diagnosis not present

## 2016-01-26 DIAGNOSIS — I69320 Aphasia following cerebral infarction: Secondary | ICD-10-CM | POA: Diagnosis not present

## 2016-01-26 DIAGNOSIS — R6889 Other general symptoms and signs: Secondary | ICD-10-CM | POA: Diagnosis not present

## 2016-01-26 DIAGNOSIS — M6281 Muscle weakness (generalized): Secondary | ICD-10-CM | POA: Diagnosis not present

## 2016-01-26 DIAGNOSIS — R269 Unspecified abnormalities of gait and mobility: Secondary | ICD-10-CM | POA: Diagnosis not present

## 2016-01-26 DIAGNOSIS — R2689 Other abnormalities of gait and mobility: Secondary | ICD-10-CM | POA: Diagnosis not present

## 2016-01-26 DIAGNOSIS — R278 Other lack of coordination: Secondary | ICD-10-CM | POA: Diagnosis not present

## 2016-01-27 DIAGNOSIS — M6281 Muscle weakness (generalized): Secondary | ICD-10-CM | POA: Diagnosis not present

## 2016-01-27 DIAGNOSIS — R6889 Other general symptoms and signs: Secondary | ICD-10-CM | POA: Diagnosis not present

## 2016-01-27 DIAGNOSIS — I69320 Aphasia following cerebral infarction: Secondary | ICD-10-CM | POA: Diagnosis not present

## 2016-01-27 DIAGNOSIS — R2689 Other abnormalities of gait and mobility: Secondary | ICD-10-CM | POA: Diagnosis not present

## 2016-01-27 DIAGNOSIS — R269 Unspecified abnormalities of gait and mobility: Secondary | ICD-10-CM | POA: Diagnosis not present

## 2016-01-27 DIAGNOSIS — R278 Other lack of coordination: Secondary | ICD-10-CM | POA: Diagnosis not present

## 2016-01-31 DIAGNOSIS — R2689 Other abnormalities of gait and mobility: Secondary | ICD-10-CM | POA: Diagnosis not present

## 2016-01-31 DIAGNOSIS — R278 Other lack of coordination: Secondary | ICD-10-CM | POA: Diagnosis not present

## 2016-01-31 DIAGNOSIS — I69320 Aphasia following cerebral infarction: Secondary | ICD-10-CM | POA: Diagnosis not present

## 2016-01-31 DIAGNOSIS — R269 Unspecified abnormalities of gait and mobility: Secondary | ICD-10-CM | POA: Diagnosis not present

## 2016-01-31 DIAGNOSIS — M6281 Muscle weakness (generalized): Secondary | ICD-10-CM | POA: Diagnosis not present

## 2016-01-31 DIAGNOSIS — R6889 Other general symptoms and signs: Secondary | ICD-10-CM | POA: Diagnosis not present

## 2016-02-02 DIAGNOSIS — I10 Essential (primary) hypertension: Secondary | ICD-10-CM | POA: Diagnosis not present

## 2016-02-02 DIAGNOSIS — R269 Unspecified abnormalities of gait and mobility: Secondary | ICD-10-CM | POA: Diagnosis not present

## 2016-02-02 DIAGNOSIS — R2689 Other abnormalities of gait and mobility: Secondary | ICD-10-CM | POA: Diagnosis not present

## 2016-02-02 DIAGNOSIS — R278 Other lack of coordination: Secondary | ICD-10-CM | POA: Diagnosis not present

## 2016-02-02 DIAGNOSIS — R6889 Other general symptoms and signs: Secondary | ICD-10-CM | POA: Diagnosis not present

## 2016-02-02 DIAGNOSIS — I69320 Aphasia following cerebral infarction: Secondary | ICD-10-CM | POA: Diagnosis not present

## 2016-02-02 DIAGNOSIS — M6281 Muscle weakness (generalized): Secondary | ICD-10-CM | POA: Diagnosis not present

## 2016-02-08 DIAGNOSIS — I639 Cerebral infarction, unspecified: Secondary | ICD-10-CM | POA: Diagnosis not present

## 2016-02-08 DIAGNOSIS — R269 Unspecified abnormalities of gait and mobility: Secondary | ICD-10-CM | POA: Diagnosis not present

## 2016-02-08 DIAGNOSIS — M6281 Muscle weakness (generalized): Secondary | ICD-10-CM | POA: Diagnosis not present

## 2016-02-08 DIAGNOSIS — R2689 Other abnormalities of gait and mobility: Secondary | ICD-10-CM | POA: Diagnosis not present

## 2016-02-08 DIAGNOSIS — R6889 Other general symptoms and signs: Secondary | ICD-10-CM | POA: Diagnosis not present

## 2016-02-08 DIAGNOSIS — R278 Other lack of coordination: Secondary | ICD-10-CM | POA: Diagnosis not present

## 2016-02-08 DIAGNOSIS — I69320 Aphasia following cerebral infarction: Secondary | ICD-10-CM | POA: Diagnosis not present

## 2016-02-08 DIAGNOSIS — R4701 Aphasia: Secondary | ICD-10-CM | POA: Diagnosis not present

## 2016-02-10 DIAGNOSIS — I70213 Atherosclerosis of native arteries of extremities with intermittent claudication, bilateral legs: Secondary | ICD-10-CM | POA: Diagnosis not present

## 2016-02-10 DIAGNOSIS — R278 Other lack of coordination: Secondary | ICD-10-CM | POA: Diagnosis not present

## 2016-02-10 DIAGNOSIS — M2042 Other hammer toe(s) (acquired), left foot: Secondary | ICD-10-CM | POA: Diagnosis not present

## 2016-02-10 DIAGNOSIS — I69320 Aphasia following cerebral infarction: Secondary | ICD-10-CM | POA: Diagnosis not present

## 2016-02-10 DIAGNOSIS — E1142 Type 2 diabetes mellitus with diabetic polyneuropathy: Secondary | ICD-10-CM | POA: Diagnosis not present

## 2016-02-10 DIAGNOSIS — M6281 Muscle weakness (generalized): Secondary | ICD-10-CM | POA: Diagnosis not present

## 2016-02-10 DIAGNOSIS — M2041 Other hammer toe(s) (acquired), right foot: Secondary | ICD-10-CM | POA: Diagnosis not present

## 2016-02-10 DIAGNOSIS — R2689 Other abnormalities of gait and mobility: Secondary | ICD-10-CM | POA: Diagnosis not present

## 2016-02-10 DIAGNOSIS — R269 Unspecified abnormalities of gait and mobility: Secondary | ICD-10-CM | POA: Diagnosis not present

## 2016-02-10 DIAGNOSIS — R6889 Other general symptoms and signs: Secondary | ICD-10-CM | POA: Diagnosis not present

## 2016-02-15 DIAGNOSIS — Z794 Long term (current) use of insulin: Secondary | ICD-10-CM | POA: Diagnosis not present

## 2016-02-15 DIAGNOSIS — G4733 Obstructive sleep apnea (adult) (pediatric): Secondary | ICD-10-CM | POA: Diagnosis not present

## 2016-02-15 DIAGNOSIS — Z8673 Personal history of transient ischemic attack (TIA), and cerebral infarction without residual deficits: Secondary | ICD-10-CM | POA: Diagnosis not present

## 2016-02-15 DIAGNOSIS — E1165 Type 2 diabetes mellitus with hyperglycemia: Secondary | ICD-10-CM | POA: Diagnosis not present

## 2016-02-15 DIAGNOSIS — Z6839 Body mass index (BMI) 39.0-39.9, adult: Secondary | ICD-10-CM | POA: Diagnosis not present

## 2016-02-16 DIAGNOSIS — R6889 Other general symptoms and signs: Secondary | ICD-10-CM | POA: Diagnosis not present

## 2016-02-16 DIAGNOSIS — R269 Unspecified abnormalities of gait and mobility: Secondary | ICD-10-CM | POA: Diagnosis not present

## 2016-02-16 DIAGNOSIS — M6281 Muscle weakness (generalized): Secondary | ICD-10-CM | POA: Diagnosis not present

## 2016-02-16 DIAGNOSIS — R278 Other lack of coordination: Secondary | ICD-10-CM | POA: Diagnosis not present

## 2016-02-16 DIAGNOSIS — I69320 Aphasia following cerebral infarction: Secondary | ICD-10-CM | POA: Diagnosis not present

## 2016-02-16 DIAGNOSIS — R2689 Other abnormalities of gait and mobility: Secondary | ICD-10-CM | POA: Diagnosis not present

## 2016-02-21 DIAGNOSIS — R2689 Other abnormalities of gait and mobility: Secondary | ICD-10-CM | POA: Diagnosis not present

## 2016-02-21 DIAGNOSIS — R6889 Other general symptoms and signs: Secondary | ICD-10-CM | POA: Diagnosis not present

## 2016-02-21 DIAGNOSIS — M6281 Muscle weakness (generalized): Secondary | ICD-10-CM | POA: Diagnosis not present

## 2016-02-21 DIAGNOSIS — R269 Unspecified abnormalities of gait and mobility: Secondary | ICD-10-CM | POA: Diagnosis not present

## 2016-02-21 DIAGNOSIS — I69320 Aphasia following cerebral infarction: Secondary | ICD-10-CM | POA: Diagnosis not present

## 2016-02-21 DIAGNOSIS — R278 Other lack of coordination: Secondary | ICD-10-CM | POA: Diagnosis not present

## 2016-02-22 DIAGNOSIS — R6889 Other general symptoms and signs: Secondary | ICD-10-CM | POA: Diagnosis not present

## 2016-02-22 DIAGNOSIS — R278 Other lack of coordination: Secondary | ICD-10-CM | POA: Diagnosis not present

## 2016-02-22 DIAGNOSIS — R269 Unspecified abnormalities of gait and mobility: Secondary | ICD-10-CM | POA: Diagnosis not present

## 2016-02-22 DIAGNOSIS — I69320 Aphasia following cerebral infarction: Secondary | ICD-10-CM | POA: Diagnosis not present

## 2016-02-22 DIAGNOSIS — M6281 Muscle weakness (generalized): Secondary | ICD-10-CM | POA: Diagnosis not present

## 2016-02-22 DIAGNOSIS — R2689 Other abnormalities of gait and mobility: Secondary | ICD-10-CM | POA: Diagnosis not present

## 2016-02-24 DIAGNOSIS — M6281 Muscle weakness (generalized): Secondary | ICD-10-CM | POA: Diagnosis not present

## 2016-02-24 DIAGNOSIS — R278 Other lack of coordination: Secondary | ICD-10-CM | POA: Diagnosis not present

## 2016-02-24 DIAGNOSIS — R6889 Other general symptoms and signs: Secondary | ICD-10-CM | POA: Diagnosis not present

## 2016-02-24 DIAGNOSIS — R269 Unspecified abnormalities of gait and mobility: Secondary | ICD-10-CM | POA: Diagnosis not present

## 2016-02-24 DIAGNOSIS — I69320 Aphasia following cerebral infarction: Secondary | ICD-10-CM | POA: Diagnosis not present

## 2016-02-24 DIAGNOSIS — R2689 Other abnormalities of gait and mobility: Secondary | ICD-10-CM | POA: Diagnosis not present

## 2016-02-27 DIAGNOSIS — E118 Type 2 diabetes mellitus with unspecified complications: Secondary | ICD-10-CM | POA: Diagnosis not present

## 2016-02-27 DIAGNOSIS — I10 Essential (primary) hypertension: Secondary | ICD-10-CM | POA: Diagnosis not present

## 2016-02-27 DIAGNOSIS — G4733 Obstructive sleep apnea (adult) (pediatric): Secondary | ICD-10-CM | POA: Diagnosis not present

## 2016-02-27 DIAGNOSIS — Z794 Long term (current) use of insulin: Secondary | ICD-10-CM | POA: Diagnosis not present

## 2016-02-27 DIAGNOSIS — Z6841 Body Mass Index (BMI) 40.0 and over, adult: Secondary | ICD-10-CM | POA: Diagnosis not present

## 2016-02-27 DIAGNOSIS — I69328 Other speech and language deficits following cerebral infarction: Secondary | ICD-10-CM | POA: Diagnosis not present

## 2016-02-28 DIAGNOSIS — R2689 Other abnormalities of gait and mobility: Secondary | ICD-10-CM | POA: Diagnosis not present

## 2016-02-28 DIAGNOSIS — Z9119 Patient's noncompliance with other medical treatment and regimen: Secondary | ICD-10-CM | POA: Diagnosis not present

## 2016-02-28 DIAGNOSIS — I10 Essential (primary) hypertension: Secondary | ICD-10-CM | POA: Diagnosis not present

## 2016-02-28 DIAGNOSIS — I69351 Hemiplegia and hemiparesis following cerebral infarction affecting right dominant side: Secondary | ICD-10-CM | POA: Diagnosis not present

## 2016-02-28 DIAGNOSIS — G4733 Obstructive sleep apnea (adult) (pediatric): Secondary | ICD-10-CM | POA: Diagnosis not present

## 2016-02-28 DIAGNOSIS — E119 Type 2 diabetes mellitus without complications: Secondary | ICD-10-CM | POA: Diagnosis not present

## 2016-02-28 DIAGNOSIS — I69328 Other speech and language deficits following cerebral infarction: Secondary | ICD-10-CM | POA: Diagnosis not present

## 2016-02-28 DIAGNOSIS — I69320 Aphasia following cerebral infarction: Secondary | ICD-10-CM | POA: Diagnosis not present

## 2016-02-28 DIAGNOSIS — R269 Unspecified abnormalities of gait and mobility: Secondary | ICD-10-CM | POA: Diagnosis not present

## 2016-02-28 DIAGNOSIS — R278 Other lack of coordination: Secondary | ICD-10-CM | POA: Diagnosis not present

## 2016-02-28 DIAGNOSIS — R6889 Other general symptoms and signs: Secondary | ICD-10-CM | POA: Diagnosis not present

## 2016-02-28 DIAGNOSIS — M6281 Muscle weakness (generalized): Secondary | ICD-10-CM | POA: Diagnosis not present

## 2016-02-29 DIAGNOSIS — H25013 Cortical age-related cataract, bilateral: Secondary | ICD-10-CM | POA: Diagnosis not present

## 2016-02-29 DIAGNOSIS — Z8673 Personal history of transient ischemic attack (TIA), and cerebral infarction without residual deficits: Secondary | ICD-10-CM | POA: Diagnosis not present

## 2016-02-29 DIAGNOSIS — Z794 Long term (current) use of insulin: Secondary | ICD-10-CM | POA: Diagnosis not present

## 2016-02-29 DIAGNOSIS — E119 Type 2 diabetes mellitus without complications: Secondary | ICD-10-CM | POA: Diagnosis not present

## 2016-02-29 DIAGNOSIS — H47291 Other optic atrophy, right eye: Secondary | ICD-10-CM | POA: Diagnosis not present

## 2016-03-01 DIAGNOSIS — R6889 Other general symptoms and signs: Secondary | ICD-10-CM | POA: Diagnosis not present

## 2016-03-01 DIAGNOSIS — I69320 Aphasia following cerebral infarction: Secondary | ICD-10-CM | POA: Diagnosis not present

## 2016-03-01 DIAGNOSIS — M6281 Muscle weakness (generalized): Secondary | ICD-10-CM | POA: Diagnosis not present

## 2016-03-01 DIAGNOSIS — R269 Unspecified abnormalities of gait and mobility: Secondary | ICD-10-CM | POA: Diagnosis not present

## 2016-03-01 DIAGNOSIS — R278 Other lack of coordination: Secondary | ICD-10-CM | POA: Diagnosis not present

## 2016-03-01 DIAGNOSIS — R2689 Other abnormalities of gait and mobility: Secondary | ICD-10-CM | POA: Diagnosis not present

## 2016-03-02 DIAGNOSIS — M6281 Muscle weakness (generalized): Secondary | ICD-10-CM | POA: Diagnosis not present

## 2016-03-02 DIAGNOSIS — R278 Other lack of coordination: Secondary | ICD-10-CM | POA: Diagnosis not present

## 2016-03-02 DIAGNOSIS — I69320 Aphasia following cerebral infarction: Secondary | ICD-10-CM | POA: Diagnosis not present

## 2016-03-02 DIAGNOSIS — R2689 Other abnormalities of gait and mobility: Secondary | ICD-10-CM | POA: Diagnosis not present

## 2016-03-02 DIAGNOSIS — R6889 Other general symptoms and signs: Secondary | ICD-10-CM | POA: Diagnosis not present

## 2016-03-02 DIAGNOSIS — R269 Unspecified abnormalities of gait and mobility: Secondary | ICD-10-CM | POA: Diagnosis not present

## 2016-03-05 DIAGNOSIS — R6889 Other general symptoms and signs: Secondary | ICD-10-CM | POA: Diagnosis not present

## 2016-03-05 DIAGNOSIS — R278 Other lack of coordination: Secondary | ICD-10-CM | POA: Diagnosis not present

## 2016-03-05 DIAGNOSIS — R2689 Other abnormalities of gait and mobility: Secondary | ICD-10-CM | POA: Diagnosis not present

## 2016-03-05 DIAGNOSIS — R269 Unspecified abnormalities of gait and mobility: Secondary | ICD-10-CM | POA: Diagnosis not present

## 2016-03-05 DIAGNOSIS — I69320 Aphasia following cerebral infarction: Secondary | ICD-10-CM | POA: Diagnosis not present

## 2016-03-05 DIAGNOSIS — M6281 Muscle weakness (generalized): Secondary | ICD-10-CM | POA: Diagnosis not present

## 2016-03-09 DIAGNOSIS — R278 Other lack of coordination: Secondary | ICD-10-CM | POA: Diagnosis not present

## 2016-03-09 DIAGNOSIS — I69320 Aphasia following cerebral infarction: Secondary | ICD-10-CM | POA: Diagnosis not present

## 2016-03-09 DIAGNOSIS — R4701 Aphasia: Secondary | ICD-10-CM | POA: Diagnosis not present

## 2016-03-09 DIAGNOSIS — R6889 Other general symptoms and signs: Secondary | ICD-10-CM | POA: Diagnosis not present

## 2016-03-09 DIAGNOSIS — R269 Unspecified abnormalities of gait and mobility: Secondary | ICD-10-CM | POA: Diagnosis not present

## 2016-03-09 DIAGNOSIS — I639 Cerebral infarction, unspecified: Secondary | ICD-10-CM | POA: Diagnosis not present

## 2016-03-09 DIAGNOSIS — R2689 Other abnormalities of gait and mobility: Secondary | ICD-10-CM | POA: Diagnosis not present

## 2016-03-09 DIAGNOSIS — M6281 Muscle weakness (generalized): Secondary | ICD-10-CM | POA: Diagnosis not present

## 2016-03-12 DIAGNOSIS — N509 Disorder of male genital organs, unspecified: Secondary | ICD-10-CM | POA: Diagnosis not present

## 2016-03-12 DIAGNOSIS — G4733 Obstructive sleep apnea (adult) (pediatric): Secondary | ICD-10-CM | POA: Diagnosis not present

## 2016-03-12 DIAGNOSIS — Z6841 Body Mass Index (BMI) 40.0 and over, adult: Secondary | ICD-10-CM | POA: Diagnosis not present

## 2016-03-13 DIAGNOSIS — R6889 Other general symptoms and signs: Secondary | ICD-10-CM | POA: Diagnosis not present

## 2016-03-13 DIAGNOSIS — M6281 Muscle weakness (generalized): Secondary | ICD-10-CM | POA: Diagnosis not present

## 2016-03-13 DIAGNOSIS — R2689 Other abnormalities of gait and mobility: Secondary | ICD-10-CM | POA: Diagnosis not present

## 2016-03-13 DIAGNOSIS — R269 Unspecified abnormalities of gait and mobility: Secondary | ICD-10-CM | POA: Diagnosis not present

## 2016-03-13 DIAGNOSIS — I69320 Aphasia following cerebral infarction: Secondary | ICD-10-CM | POA: Diagnosis not present

## 2016-03-13 DIAGNOSIS — R278 Other lack of coordination: Secondary | ICD-10-CM | POA: Diagnosis not present

## 2016-03-19 DIAGNOSIS — I69351 Hemiplegia and hemiparesis following cerebral infarction affecting right dominant side: Secondary | ICD-10-CM | POA: Diagnosis not present

## 2016-03-19 DIAGNOSIS — Z6841 Body Mass Index (BMI) 40.0 and over, adult: Secondary | ICD-10-CM | POA: Diagnosis not present

## 2016-03-19 DIAGNOSIS — I69328 Other speech and language deficits following cerebral infarction: Secondary | ICD-10-CM | POA: Diagnosis not present

## 2016-03-19 DIAGNOSIS — E119 Type 2 diabetes mellitus without complications: Secondary | ICD-10-CM | POA: Diagnosis not present

## 2016-03-21 DIAGNOSIS — R6889 Other general symptoms and signs: Secondary | ICD-10-CM | POA: Diagnosis not present

## 2016-03-21 DIAGNOSIS — R269 Unspecified abnormalities of gait and mobility: Secondary | ICD-10-CM | POA: Diagnosis not present

## 2016-03-21 DIAGNOSIS — I69320 Aphasia following cerebral infarction: Secondary | ICD-10-CM | POA: Diagnosis not present

## 2016-03-21 DIAGNOSIS — R2689 Other abnormalities of gait and mobility: Secondary | ICD-10-CM | POA: Diagnosis not present

## 2016-03-21 DIAGNOSIS — M6281 Muscle weakness (generalized): Secondary | ICD-10-CM | POA: Diagnosis not present

## 2016-03-21 DIAGNOSIS — R278 Other lack of coordination: Secondary | ICD-10-CM | POA: Diagnosis not present

## 2016-03-22 DIAGNOSIS — R6889 Other general symptoms and signs: Secondary | ICD-10-CM | POA: Diagnosis not present

## 2016-03-22 DIAGNOSIS — R2689 Other abnormalities of gait and mobility: Secondary | ICD-10-CM | POA: Diagnosis not present

## 2016-03-22 DIAGNOSIS — R278 Other lack of coordination: Secondary | ICD-10-CM | POA: Diagnosis not present

## 2016-03-22 DIAGNOSIS — R269 Unspecified abnormalities of gait and mobility: Secondary | ICD-10-CM | POA: Diagnosis not present

## 2016-03-22 DIAGNOSIS — M6281 Muscle weakness (generalized): Secondary | ICD-10-CM | POA: Diagnosis not present

## 2016-03-22 DIAGNOSIS — I69320 Aphasia following cerebral infarction: Secondary | ICD-10-CM | POA: Diagnosis not present

## 2016-03-23 DIAGNOSIS — I70203 Unspecified atherosclerosis of native arteries of extremities, bilateral legs: Secondary | ICD-10-CM | POA: Diagnosis not present

## 2016-03-23 DIAGNOSIS — R6889 Other general symptoms and signs: Secondary | ICD-10-CM | POA: Diagnosis not present

## 2016-03-23 DIAGNOSIS — R269 Unspecified abnormalities of gait and mobility: Secondary | ICD-10-CM | POA: Diagnosis not present

## 2016-03-23 DIAGNOSIS — R278 Other lack of coordination: Secondary | ICD-10-CM | POA: Diagnosis not present

## 2016-03-23 DIAGNOSIS — E1142 Type 2 diabetes mellitus with diabetic polyneuropathy: Secondary | ICD-10-CM | POA: Diagnosis not present

## 2016-03-23 DIAGNOSIS — M6281 Muscle weakness (generalized): Secondary | ICD-10-CM | POA: Diagnosis not present

## 2016-03-23 DIAGNOSIS — M2041 Other hammer toe(s) (acquired), right foot: Secondary | ICD-10-CM | POA: Diagnosis not present

## 2016-03-23 DIAGNOSIS — R2689 Other abnormalities of gait and mobility: Secondary | ICD-10-CM | POA: Diagnosis not present

## 2016-03-23 DIAGNOSIS — M2042 Other hammer toe(s) (acquired), left foot: Secondary | ICD-10-CM | POA: Diagnosis not present

## 2016-03-23 DIAGNOSIS — I69320 Aphasia following cerebral infarction: Secondary | ICD-10-CM | POA: Diagnosis not present

## 2016-03-27 DIAGNOSIS — R6889 Other general symptoms and signs: Secondary | ICD-10-CM | POA: Diagnosis not present

## 2016-03-27 DIAGNOSIS — M6281 Muscle weakness (generalized): Secondary | ICD-10-CM | POA: Diagnosis not present

## 2016-03-27 DIAGNOSIS — I69320 Aphasia following cerebral infarction: Secondary | ICD-10-CM | POA: Diagnosis not present

## 2016-03-27 DIAGNOSIS — R2689 Other abnormalities of gait and mobility: Secondary | ICD-10-CM | POA: Diagnosis not present

## 2016-03-27 DIAGNOSIS — R269 Unspecified abnormalities of gait and mobility: Secondary | ICD-10-CM | POA: Diagnosis not present

## 2016-03-27 DIAGNOSIS — R278 Other lack of coordination: Secondary | ICD-10-CM | POA: Diagnosis not present

## 2016-03-28 DIAGNOSIS — Z6841 Body Mass Index (BMI) 40.0 and over, adult: Secondary | ICD-10-CM | POA: Diagnosis not present

## 2016-03-28 DIAGNOSIS — Z794 Long term (current) use of insulin: Secondary | ICD-10-CM | POA: Diagnosis not present

## 2016-03-28 DIAGNOSIS — E119 Type 2 diabetes mellitus without complications: Secondary | ICD-10-CM | POA: Diagnosis not present

## 2016-03-28 DIAGNOSIS — I69320 Aphasia following cerebral infarction: Secondary | ICD-10-CM | POA: Diagnosis not present

## 2016-03-28 DIAGNOSIS — R143 Flatulence: Secondary | ICD-10-CM | POA: Diagnosis not present

## 2016-03-28 DIAGNOSIS — G4733 Obstructive sleep apnea (adult) (pediatric): Secondary | ICD-10-CM | POA: Diagnosis not present

## 2016-03-30 DIAGNOSIS — M6281 Muscle weakness (generalized): Secondary | ICD-10-CM | POA: Diagnosis not present

## 2016-03-30 DIAGNOSIS — R269 Unspecified abnormalities of gait and mobility: Secondary | ICD-10-CM | POA: Diagnosis not present

## 2016-03-30 DIAGNOSIS — I69320 Aphasia following cerebral infarction: Secondary | ICD-10-CM | POA: Diagnosis not present

## 2016-03-30 DIAGNOSIS — R278 Other lack of coordination: Secondary | ICD-10-CM | POA: Diagnosis not present

## 2016-03-30 DIAGNOSIS — R2689 Other abnormalities of gait and mobility: Secondary | ICD-10-CM | POA: Diagnosis not present

## 2016-03-30 DIAGNOSIS — R6889 Other general symptoms and signs: Secondary | ICD-10-CM | POA: Diagnosis not present

## 2016-04-03 DIAGNOSIS — E1142 Type 2 diabetes mellitus with diabetic polyneuropathy: Secondary | ICD-10-CM | POA: Diagnosis not present

## 2016-04-03 DIAGNOSIS — I69320 Aphasia following cerebral infarction: Secondary | ICD-10-CM | POA: Diagnosis not present

## 2016-04-03 DIAGNOSIS — L603 Nail dystrophy: Secondary | ICD-10-CM | POA: Diagnosis not present

## 2016-04-03 DIAGNOSIS — R269 Unspecified abnormalities of gait and mobility: Secondary | ICD-10-CM | POA: Diagnosis not present

## 2016-04-03 DIAGNOSIS — M2041 Other hammer toe(s) (acquired), right foot: Secondary | ICD-10-CM | POA: Diagnosis not present

## 2016-04-03 DIAGNOSIS — M6281 Muscle weakness (generalized): Secondary | ICD-10-CM | POA: Diagnosis not present

## 2016-04-03 DIAGNOSIS — B351 Tinea unguium: Secondary | ICD-10-CM | POA: Diagnosis not present

## 2016-04-03 DIAGNOSIS — R2689 Other abnormalities of gait and mobility: Secondary | ICD-10-CM | POA: Diagnosis not present

## 2016-04-03 DIAGNOSIS — R6889 Other general symptoms and signs: Secondary | ICD-10-CM | POA: Diagnosis not present

## 2016-04-03 DIAGNOSIS — R278 Other lack of coordination: Secondary | ICD-10-CM | POA: Diagnosis not present

## 2016-04-04 DIAGNOSIS — Z6841 Body Mass Index (BMI) 40.0 and over, adult: Secondary | ICD-10-CM | POA: Diagnosis not present

## 2016-04-04 DIAGNOSIS — I69328 Other speech and language deficits following cerebral infarction: Secondary | ICD-10-CM | POA: Diagnosis not present

## 2016-04-04 DIAGNOSIS — L89899 Pressure ulcer of other site, unspecified stage: Secondary | ICD-10-CM | POA: Diagnosis not present

## 2016-04-04 DIAGNOSIS — I10 Essential (primary) hypertension: Secondary | ICD-10-CM | POA: Diagnosis not present

## 2016-04-04 DIAGNOSIS — G8191 Hemiplegia, unspecified affecting right dominant side: Secondary | ICD-10-CM | POA: Diagnosis not present

## 2016-04-04 DIAGNOSIS — E1165 Type 2 diabetes mellitus with hyperglycemia: Secondary | ICD-10-CM | POA: Diagnosis not present

## 2016-04-04 DIAGNOSIS — Z794 Long term (current) use of insulin: Secondary | ICD-10-CM | POA: Diagnosis not present

## 2016-05-09 IMAGING — CT CT MAXILLOFACIAL W/O CM
3 series · 16 of 47 positions shown, 19 images · non-contrast
Comparison: None.

CLINICAL DATA: Right jaw pain/swelling x2 days. History of cranial
aneurysm surgery.

EXAM:
CT MAXILLOFACIAL WITHOUT CONTRAST
TECHNIQUE: Multidetector CT imaging of the maxillofacial structures was
performed. Multiplanar CT image reconstructions were also generated.
A small metallic BB was placed on the right temple in order to
reliably differentiate right from left.

[Series 3: facial st · axial · 0.34mm/px · z∈[-242,-92]mm · 10 of 88 slices shown, 13 images]
[im 7/88  brain]
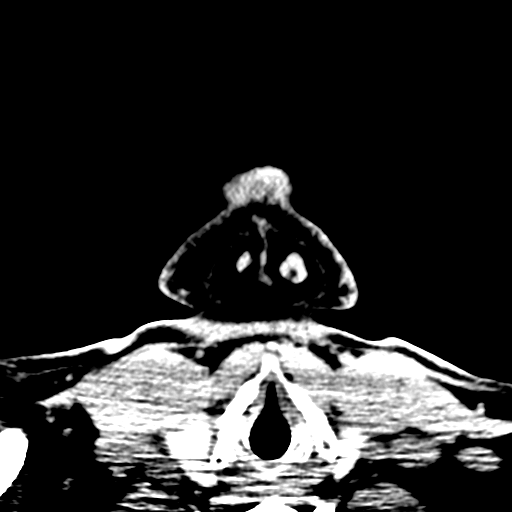
[im 7/88  bone]
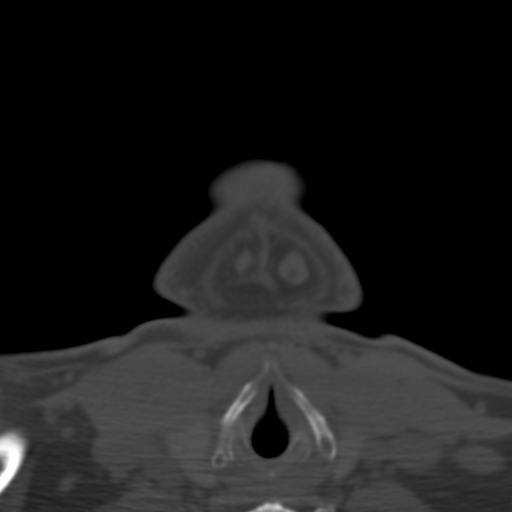
[im 16/88  bone]
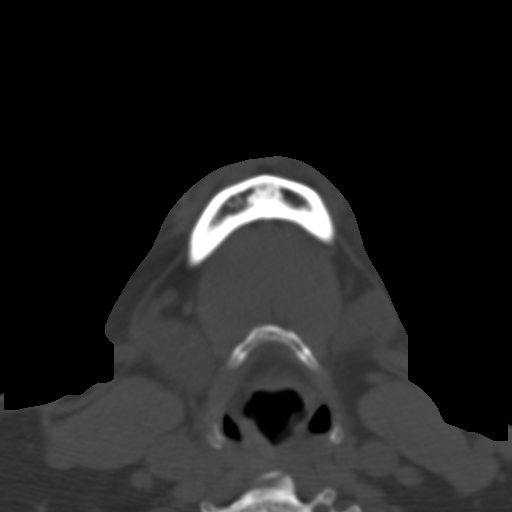
[im 25/88  bone]
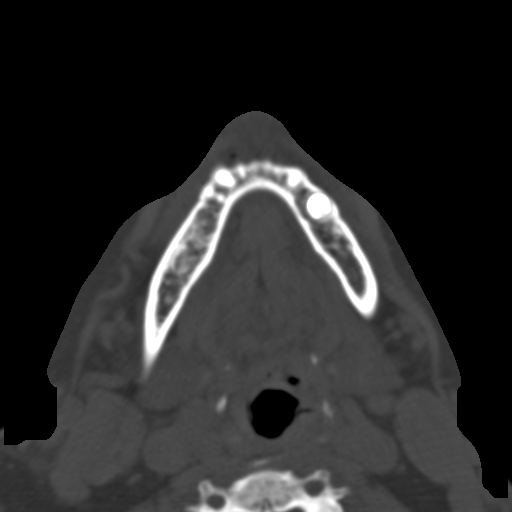
[im 31/88  bone]
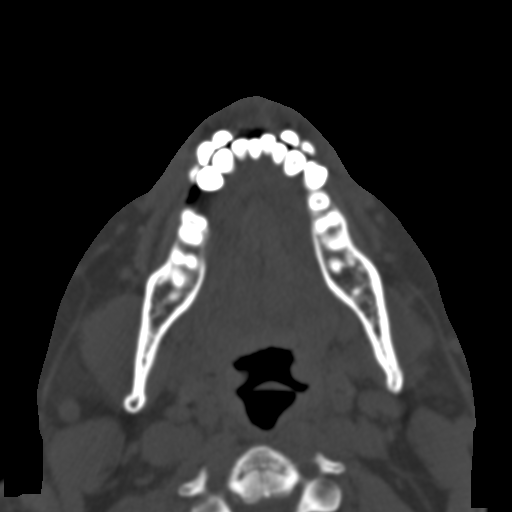
[im 40/88  brain]
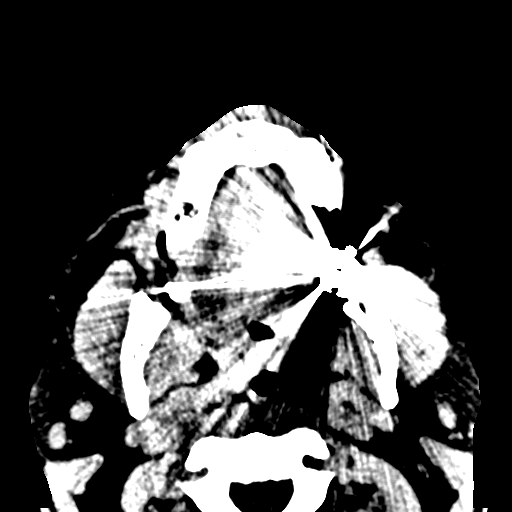
[im 40/88  bone]
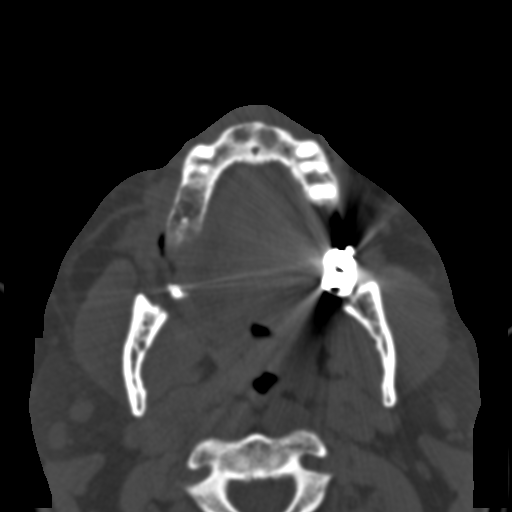
[im 49/88  bone]
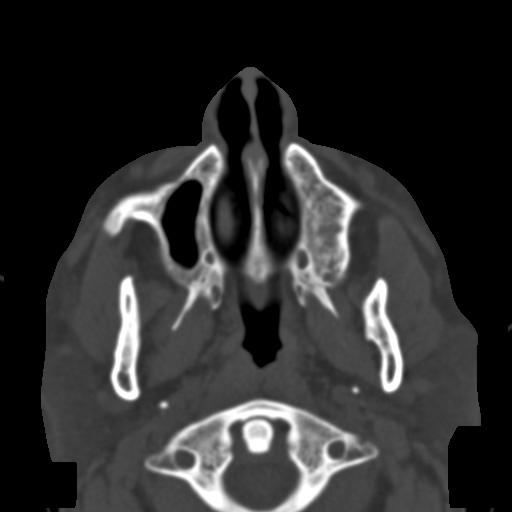
[im 58/88  bone]
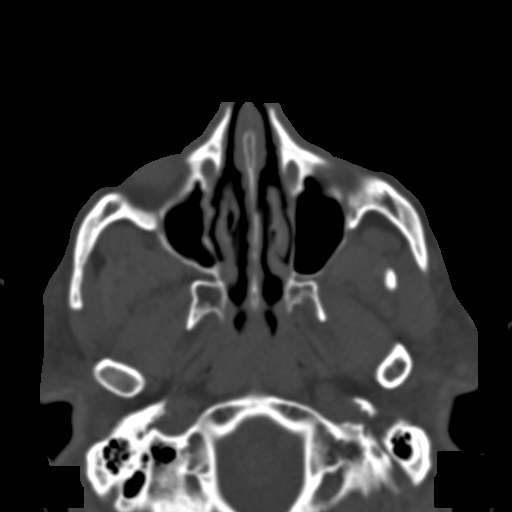
[im 67/88  bone]
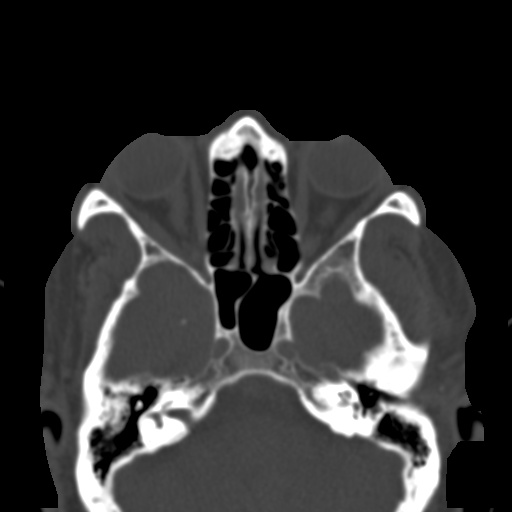
[im 73/88  brain]
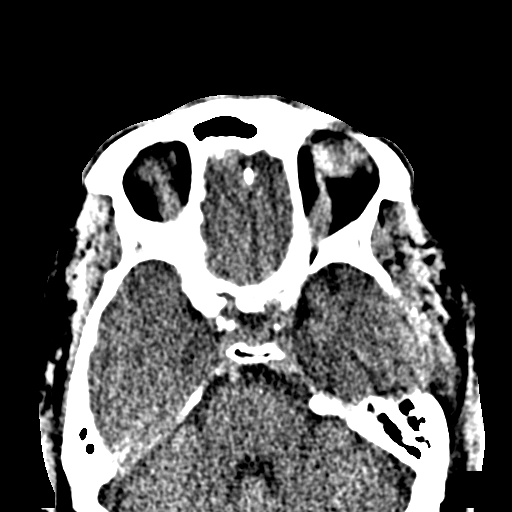
[im 73/88  bone]
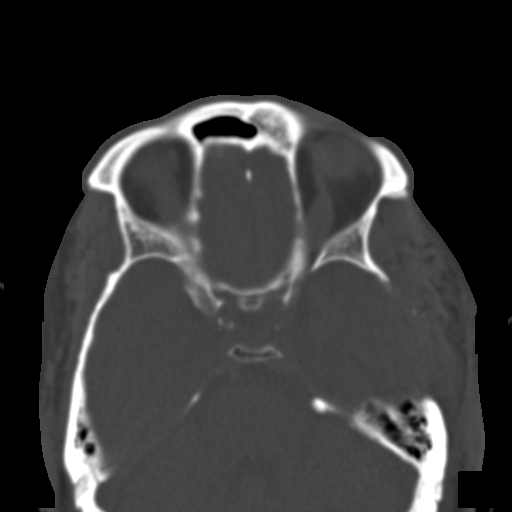
[im 82/88  bone]
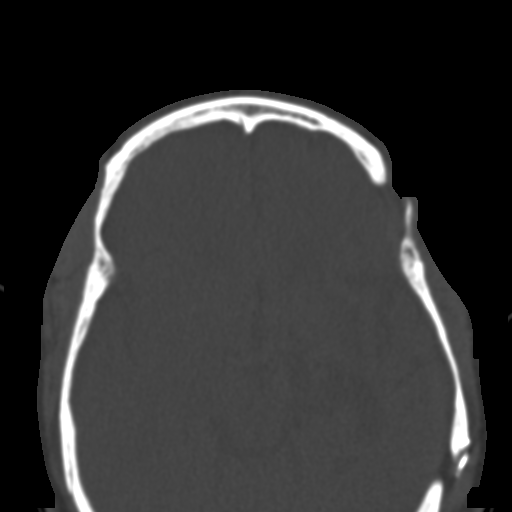

[Series 5: coronal st · coronal · 0.35mm/px · 3 of 78 slices shown]
[im 26/78  bone]
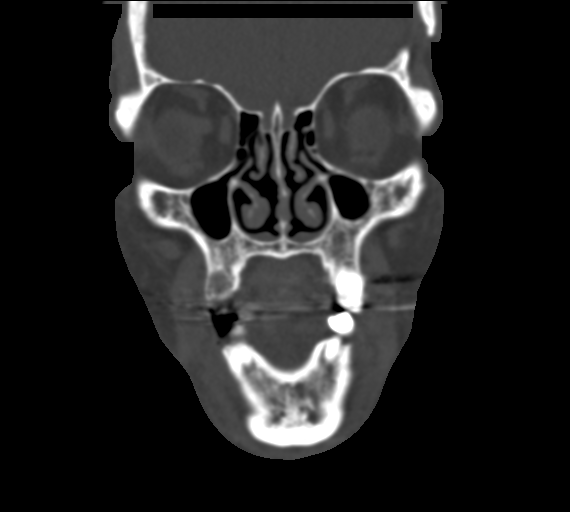
[im 35/78  bone]
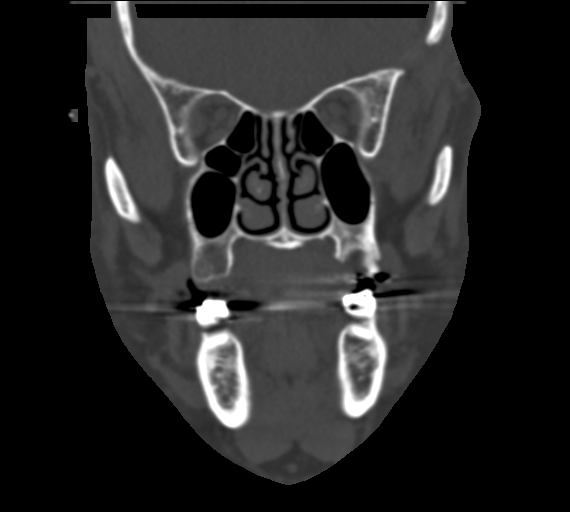
[im 43/78  bone]
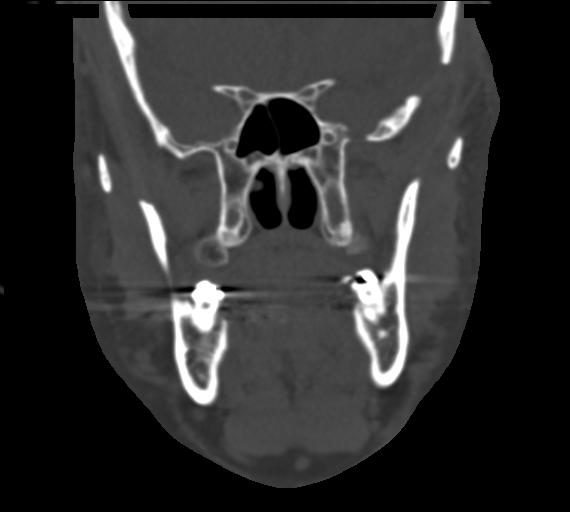

[Series 6: sagittal st · sagittal · 0.34mm/px · 3 of 82 slices shown]
[im 28/82  bone]
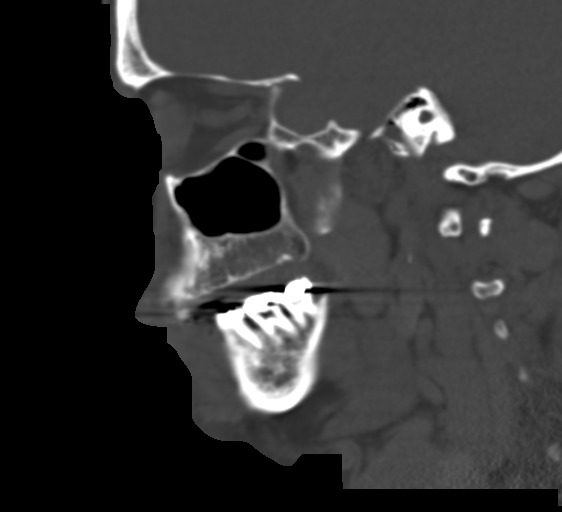
[im 41/82  bone]
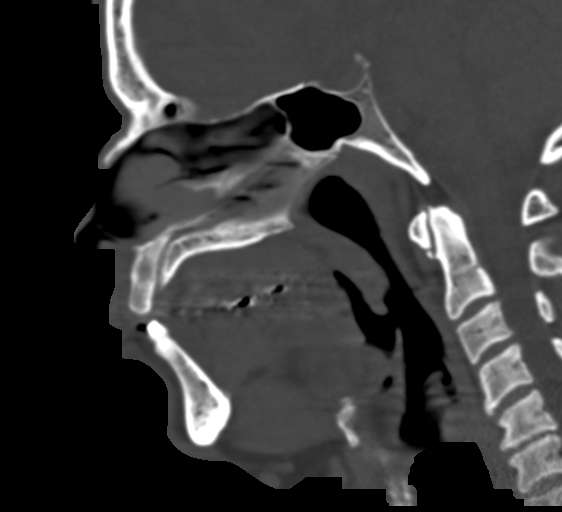
[im 55/82  bone]
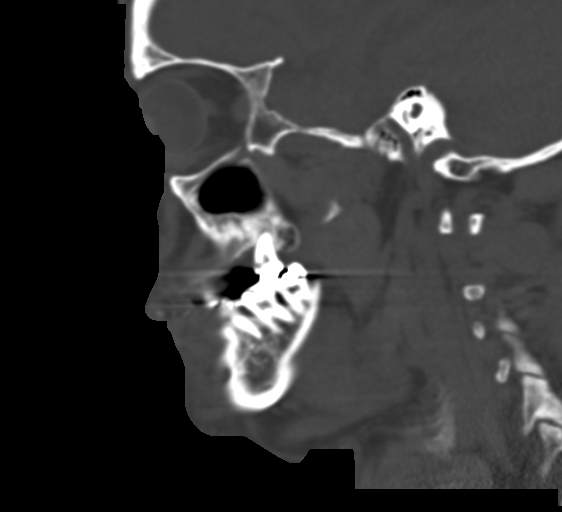

[16 of 47 positions shown; findings below may reference images not displayed]

FINDINGS: No evidence of maxillofacial fracture.

Mandible is intact. Bilateral mandibular condyles are well-seated in
the TMJs.

No evidence of odontogenic abscess.

The bilateral parotid and submandibular glands are unremarkable.

The visualized paranasal sinuses are essentially clear. The mastoid
air cells are unopacified.

Bilateral orbits, including the globes and retroconal soft tissues,
are within normal limits.

Visualized brain parenchyma is unremarkable.

Cervical spine is notable for degenerative changes at C6-7.

Postsurgical changes involving the left frontal bone. No evidence of
calvarial fracture.
IMPRESSION: Negative maxillofacial CT.

## 2016-07-22 ENCOUNTER — Emergency Department (HOSPITAL_COMMUNITY)
Admission: EM | Admit: 2016-07-22 | Discharge: 2016-07-22 | Disposition: A | Discharge status: Home / Self Care | Payer: Self-pay | Attending: Physician Assistant | Admitting: Physician Assistant

## 2016-07-22 ENCOUNTER — Encounter (HOSPITAL_COMMUNITY): Payer: Self-pay | Admitting: Emergency Medicine

## 2016-07-22 ENCOUNTER — Emergency Department (HOSPITAL_BASED_OUTPATIENT_CLINIC_OR_DEPARTMENT_OTHER)
Admit: 2016-07-22 | Discharge: 2016-07-22 | Disposition: A | Discharge status: Home / Self Care | Payer: Self-pay | Attending: Emergency Medicine | Admitting: Emergency Medicine

## 2016-07-22 ENCOUNTER — Emergency Department (HOSPITAL_COMMUNITY): Payer: Self-pay

## 2016-07-22 DIAGNOSIS — X58XXXA Exposure to other specified factors, initial encounter: Secondary | ICD-10-CM | POA: Insufficient documentation

## 2016-07-22 DIAGNOSIS — M79605 Pain in left leg: Secondary | ICD-10-CM

## 2016-07-22 DIAGNOSIS — Z8673 Personal history of transient ischemic attack (TIA), and cerebral infarction without residual deficits: Secondary | ICD-10-CM | POA: Insufficient documentation

## 2016-07-22 DIAGNOSIS — S81802A Unspecified open wound, left lower leg, initial encounter: Secondary | ICD-10-CM | POA: Insufficient documentation

## 2016-07-22 DIAGNOSIS — M7989 Other specified soft tissue disorders: Secondary | ICD-10-CM

## 2016-07-22 DIAGNOSIS — Y929 Unspecified place or not applicable: Secondary | ICD-10-CM | POA: Insufficient documentation

## 2016-07-22 DIAGNOSIS — N183 Chronic kidney disease, stage 3 (moderate): Secondary | ICD-10-CM | POA: Insufficient documentation

## 2016-07-22 DIAGNOSIS — E119 Type 2 diabetes mellitus without complications: Secondary | ICD-10-CM | POA: Insufficient documentation

## 2016-07-22 DIAGNOSIS — I129 Hypertensive chronic kidney disease with stage 1 through stage 4 chronic kidney disease, or unspecified chronic kidney disease: Secondary | ICD-10-CM | POA: Insufficient documentation

## 2016-07-22 DIAGNOSIS — Z79899 Other long term (current) drug therapy: Secondary | ICD-10-CM | POA: Insufficient documentation

## 2016-07-22 DIAGNOSIS — Y939 Activity, unspecified: Secondary | ICD-10-CM | POA: Insufficient documentation

## 2016-07-22 DIAGNOSIS — Y999 Unspecified external cause status: Secondary | ICD-10-CM | POA: Insufficient documentation

## 2016-07-22 LAB — CBC WITH DIFFERENTIAL/PLATELET
Basophils Absolute: 0 10*3/uL (ref 0.0–0.1)
Basophils Relative: 0 %
EOS ABS: 0.1 10*3/uL (ref 0.0–0.7)
EOS PCT: 2 %
HCT: 42.5 % (ref 39.0–52.0)
HEMOGLOBIN: 14.2 g/dL (ref 13.0–17.0)
LYMPHS ABS: 1.5 10*3/uL (ref 0.7–4.0)
Lymphocytes Relative: 29 %
MCH: 27.8 pg (ref 26.0–34.0)
MCHC: 33.4 g/dL (ref 30.0–36.0)
MCV: 83.2 fL (ref 78.0–100.0)
MONO ABS: 0.3 10*3/uL (ref 0.1–1.0)
MONOS PCT: 6 %
Neutro Abs: 3.2 10*3/uL (ref 1.7–7.7)
Neutrophils Relative %: 63 %
Platelets: 176 10*3/uL (ref 150–400)
RBC: 5.11 MIL/uL (ref 4.22–5.81)
RDW: 14.5 % (ref 11.5–15.5)
WBC: 5.1 10*3/uL (ref 4.0–10.5)

## 2016-07-22 LAB — BASIC METABOLIC PANEL
Anion gap: 7 (ref 5–15)
BUN: 15 mg/dL (ref 6–20)
CHLORIDE: 106 mmol/L (ref 101–111)
CO2: 26 mmol/L (ref 22–32)
Calcium: 8.5 mg/dL — ABNORMAL LOW (ref 8.9–10.3)
Creatinine, Ser: 1.52 mg/dL — ABNORMAL HIGH (ref 0.61–1.24)
GFR calc Af Amer: 58 mL/min — ABNORMAL LOW (ref >60)
GFR calc non Af Amer: 50 mL/min — ABNORMAL LOW (ref >60)
GLUCOSE: 225 mg/dL — AB (ref 65–99)
Potassium: 3.5 mmol/L (ref 3.5–5.1)
Sodium: 139 mmol/L (ref 135–145)

## 2016-07-22 LAB — CBG MONITORING, ED: Glucose-Capillary: 276 mg/dL — ABNORMAL HIGH (ref 65–99)

## 2016-07-22 MED ORDER — CEPHALEXIN 500 MG PO CAPS: 500.0000 mg | ORAL_CAPSULE | Freq: Three times a day (TID) | ORAL | 0 refills | Status: DC

## 2016-07-22 NOTE — ED Notes (Signed)
Pt in ultrasound

## 2016-07-22 NOTE — ED Notes (Signed)
Patient transported to Ultrasound 

## 2016-07-22 NOTE — Discharge Instructions (Signed)
As we discussed, there does not appear to be any infection in the bone. There was no blood clot noted on the Ultrasound. Your labs were normal.   Take antibiotics as directed. Please take all of your antibiotics until finished.  Keep wound clean and dry.  Follow-up with the referred to wound care clinic for further evaluation and treatment.  Return the emergency department for any worsening pain, swelling or redness of the leg, fever, any other worsening or concerning symptoms.   If you do not have a primary care doctor you see regularly, please you the list below. Please call them to arrange for follow-up.    No Primary Care Doctor Call [facility]  [phone] Other agencies that provide inexpensive medical care    [facility]  [phone]    [facility]  [phone]    [facility]  [phone]    [facility]  [phone]    [facility]  [phone]    [facility]  [phone]

## 2016-07-22 NOTE — ED Triage Notes (Signed)
Pt/wife stated, [place] [employer] [place] and my rt. Leg got bumped and its turned red, and swollen.

## 2016-07-22 NOTE — Progress Notes (Signed)
VASCULAR LAB PRELIMINARY  PRELIMINARY  PRELIMINARY  PRELIMINARY  Right lower extremity venous duplex completed.    Preliminary report:  There is no DVT or SVT noted in the right lower extremity.  Called report to [place]etaLindsey Layden, PA  [facility], RVT 07/22/2016, 3:45 PM

## 2016-07-22 NOTE — ED Notes (Signed)
Attempted to contact wife regarding pt being discharged. Wife's phone number [phone]. Pt placed in front of lobby. Nurse first informed of situation. Reviewed discharge instructions with pt.

## 2016-07-22 NOTE — ED Notes (Signed)
Archie Pattenonya (Wife): [phone]

## 2016-07-22 NOTE — ED Provider Notes (Signed)
[facility] DEPT Provider Note   CSN: [id number] Arrival date & time: 07/22/16  1001     History   Chief Complaint Chief Complaint  Patient presents with  . Leg Pain  . Leg Injury    HPI Justin Molina is a 56 y.o. male past medical history of stroke with right-sided deficits, diabetes, chronic renal insufficiency who presents To month of worsening leg wound. Wife states the patient has been in [place] living with his mother and receiving medical care. She reports that about 2 months ago patient he developed a small sore to the lateral aspect of the right lower leg. Patient recently traveled from [place] to [place] 3 days ago and when wife looked at the leg, she noted that the sore had not healed. Wife notes that yesterday patient was at a graduation ceremony and hit the leg on the corner of a table, which exacerbated his pain. Patient brought in by wife today for concerns of nonhealing wound, warmth and redness to leg. Patient has still been able to use the leg without any difficulty. He has been in his normal baseline in terms of the ablation function. Wife denies any recent fevers. Patient is currently on insulin for management of his diabetes. She reports that his blood sugars average in the 160s range. She does not know his last A1c. Patient did recently travel 11 hours in the car from [place] to [place]. Patient denies any chest pain, shortness breath.   The history is provided by the patient.    Past Medical History:  Diagnosis Date  . Chronic renal insufficiency   . Diabetes mellitus without complication ([place])   . Hypertension   . Stroke [facility])     Patient Active Problem List   Diagnosis Date Noted  . CKD (chronic kidney disease) stage 3, GFR 30-59 ml/min 08/24/2014  . HLD (hyperlipidemia) 08/24/2014  . Anxiety 08/24/2014  . DM type 2, uncontrolled, with renal complications ([place]) 28/41/3244  . Diabetic neuropathy ([place]) 08/12/2014  . HTN (hypertension)  08/12/2014  . History of CVA with residual deficit 08/12/2014    Past Surgical History:  Procedure Laterality Date  . BRAIN SURGERY    . GASTROSTOMY TUBE PLACEMENT    . HERNIA REPAIR    . TRACHEOSTOMY         Home Medications    Prior to Admission medications   Medication Sig Start Date End Date Taking? Authorizing Provider  acetaminophen (TYLENOL) 500 MG tablet Take 1 tablet (500 mg total) by mouth every 6 (six) hours as needed. 12/26/14  Yes Glendell Docker, NP  amLODipine (NORVASC) 10 MG tablet Take 1 tablet (10 mg total) by mouth daily. 08/12/14  Yes Lance Bosch, NP  atorvastatin (LIPITOR) 40 MG tablet Take 1 tablet (40 mg total) by mouth at bedtime. 08/12/14  Yes Lance Bosch, NP  Blood Glucose Monitoring Suppl (ACCU-CHEK AVIVA PLUS) W/DEVICE KIT Check blood sugar TID  & QHS 11/17/14  Yes Lance Bosch, NP  busPIRone (BUSPAR) 10 MG tablet Take 1 tablet (10 mg total) by mouth 2 (two) times daily. Must have office visit for refills 04/11/15  Yes Chari Manning A, NP  carvedilol (COREG) 25 MG tablet Take 2 tablets (50 mg total) by mouth 2 (two) times daily with a meal. 08/12/14  Yes Lance Bosch, NP  cloNIDine (CATAPRES) 0.2 MG tablet Take 1 tablet (0.2 mg total) by mouth 3 (three) times daily. Pt needs OV for future refills 02/10/15  Yes Feliciana Rossetti,  Jannifer Rodney, NP  fluticasone (FLONASE) 50 MCG/ACT nasal spray Place 1 spray into both nostrils daily as needed for allergies.    Yes [provider]  glimepiride (AMARYL) 4 MG tablet Take 1 tablet (4 mg total) by mouth 2 (two) times daily. 08/12/14  Yes Chari Manning A, NP  glucose blood (ACCU-CHEK AVIVA PLUS) test strip Use as instructed for TID and QHS blood glucose testing 05/23/15  Yes Jegede, Olugbemiga E, MD  hydrALAZINE (APRESOLINE) 50 MG tablet Take 1 tablet (50 mg total) by mouth 3 (three) times daily. Must have office visit for refills 04/11/15  Yes Chari Manning A, NP  Lancets 28G MISC Check blood sugar TID & QHS 05/23/15  Yes  Jegede, Olugbemiga E, MD  LEVEMIR FLEXTOUCH 100 UNIT/ML Pen Inject 48 Units into the skin daily at 10 pm. Patient taking differently: Inject 25 Units into the skin 2 (two) times daily.  08/17/14  Yes Tresa Garter, MD  Misc. Devices (BATHTUB SAFETY RAIL) MISC Patient needs safety rails for shower 01/04/15  Yes Chari Manning A, NP  NOVOLOG FLEXPEN 100 UNIT/ML FlexPen Administer 28 to 32 units under the skin three times daily with meals. MUST have office visit for refills Patient taking differently: Inject 15 Units into the skin 3 (three) times daily with meals. Administer 28 to 32 units under the skin three times daily with meals. MUST have office visit for refills 07/26/15  Yes Jegede, Olugbemiga E, MD  traMADol (ULTRAM) 50 MG tablet Take 50 mg by mouth every 6 (six) hours as needed for moderate pain or severe pain.   Yes [provider]  carvedilol (COREG) 25 MG tablet Take 2 tablets (50 mg total) by mouth 2 (two) times daily with a meal. Patient needs office visit for more refills 02/01/15   Chari Manning A, NP  cephALEXin (KEFLEX) 500 MG capsule Take 1 capsule (500 mg total) by mouth 3 (three) times daily. 07/22/16   Volanda Napoleon, PA-C  furosemide (LASIX) 20 MG tablet Take 1 tablet (20 mg total) by mouth 2 (two) times daily. Patient not taking: Reported on 08/27/2014 06/11/14   Lance Bosch, NP    Family History No family history on file.  Social History Social History  Substance Use Topics  . Smoking status: Never Smoker  . Smokeless tobacco: Never Used  . Alcohol use No     Allergies   Contrast media [iodinated diagnostic agents]   Review of Systems Review of Systems  Constitutional: Negative for chills and fever.  Respiratory: Negative for cough and shortness of breath.   Cardiovascular: Positive for leg swelling. Negative for chest pain.  Gastrointestinal: Negative for abdominal pain, diarrhea, nausea and vomiting.  Genitourinary: Negative for dysuria and  hematuria.  Musculoskeletal: Negative for back pain and neck pain.  Skin: Positive for color change and wound.  Neurological: Negative for weakness, numbness and headaches.  All other systems reviewed and are negative.    Physical Exam Updated Vital Signs BP (!) 166/101   Pulse 64   Temp 99 F (37.2 C) (Oral)   Resp 17   Ht 5' 11.5" (1.816 m)   Wt 127 kg (280 lb)   SpO2 98%   BMI 38.51 kg/m   Physical Exam  Constitutional: He is oriented to person, place, and time. He appears well-developed and well-nourished.  Sitting comfortably on examination table  HENT:  Head: Normocephalic and atraumatic.  Mouth/Throat: Oropharynx is clear and moist and mucous membranes are normal.  Eyes:  Conjunctivae, EOM and lids are normal. Pupils are equal, round, and reactive to light.  Neck: Full passive range of motion without pain.  Cardiovascular: Normal rate, regular rhythm and normal pulses.   Pulmonary/Chest: Effort normal and breath sounds normal.  Abdominal: Soft. Normal appearance. There is no tenderness. There is no rigidity and no guarding.  Musculoskeletal: Normal range of motion.  FROM of LUE and LLE. Limited ROM of RUE and RLE (secondary to stroke). 2+ pitting edema to BLE  that begins posteriorly to the knee and extends distally, right is slightly more edematous than left. No calf tenderness.   Neurological: He is alert and oriented to person, place, and time. GCS eye subscore is 4. GCS verbal subscore is 5. GCS motor subscore is 6.  Follows commands, Moves all extremities  5/5 strength to LUE and LLE  Further decrease strength of right upper extremity and right lower extremity. (Wife and patient state that this is distant from stroke and is not new)  Sensation intact throughout   Skin: Skin is warm and dry. Capillary refill takes less than 2 seconds.  3 x 3 cm open wound down that is malodorous with minimal surrounding erythema that is localized to the edges. No evidence of exposed  bone. There is no extending warmth, erythema, induration.  Psychiatric: He has a normal mood and affect. His speech is normal.  Nursing note and vitals reviewed.          ED Treatments / Results  Labs (all labs ordered are listed, but only abnormal results are displayed) Labs Reviewed  BASIC METABOLIC PANEL - Abnormal; Notable for the following:       Result Value   Glucose, Bld 225 (*)    Creatinine, Ser 1.52 (*)    Calcium 8.5 (*)    GFR calc non Af Amer 50 (*)    GFR calc Af Amer 58 (*)    All other components within normal limits  CBG MONITORING, ED - Abnormal; Notable for the following:    Glucose-Capillary 276 (*)    All other components within normal limits  CBC WITH DIFFERENTIAL/PLATELET    EKG  EKG Interpretation None       Radiology Dg Tibia/fibula Right  Result Date: 07/22/2016 CLINICAL DATA:  Swelling.  No trauma. EXAM: RIGHT TIBIA AND FIBULA - 2 VIEW COMPARISON:  None. FINDINGS: Diffuse soft tissue edema. Rounded lucency in the lateral soft tissues lateral to the proximal to mid fibula may represent an ulceration or soft tissue gas. Lucency over the tibia on the lateral views probably correlates with this soft tissue lucency suggesting it is anteriorly located. No other soft tissue air is noted. No fracture dislocation. No bony erosion. IMPRESSION: 1. Diffuse soft tissue edema. Rounded lucency over the lateral soft tissues as above could represent a skin ulceration or gas in the soft tissues. Lucency over the anterior tibia on the lateral views likely correlates with the soft tissue lucency as there is no evidence of bony involvement in the tibia on frontal views. No convincing bony abnormalities identified. Electronically Signed   By: Dorise Bullion III M.D   On: 07/22/2016 13:23    Procedures Procedures (including critical care time)  Medications Ordered in ED Medications - No data to display   Initial Impression / Assessment and Plan / ED Course  I  have reviewed the triage vital signs and the nursing notes.  Pertinent labs & imaging results that were available during my care of the patient were  reviewed by me and considered in my medical decision making (see chart for details).     57 year old male with past medical history of diabetes who presents with right leg wound and pain. Wound is been ongoing for the past 2 months. Was recently exacerbated yesterday when he hit it against coronary table. No history of fevers. Patient is afebrile, non-toxic appearing, sitting comfortably on examination table. Vital signs reviewed and stable. Patient is slightly hypertensive but has no history of hypertension. Will obtain basic labs including CBC and BMP for evaluation of infection. Will also obtain x-ray of the area for evaluation of any subcutaneous gas. Given that he has slightly worsening swelling of the right lower extremity and has a recent cardiac history, will obtain lower extremity ultrasound to rule out DVT. Patient and wife advised to plan.  Labs and imaging reviewed. CBC within normal limits. No elevation of white blood cell count. BMP shows elevated blood glucose. Also with slightly elevated creatinine. Review of records show that this is consistent with prior. X-ray of lower leg negative for any subcutaneous gas.   Lower extremity U/S for DVT. Discussed results with patient. Instructed him that we can provide referral to the wound clinic for further evaluation. Will plan to give outpatient antibiotics for coverage. Attempted calling wife with results but could not reach her.  Provided patient with a list of clinic resources to use if he does not have a PCP. Instructed to call them today to arrange follow-up in the next 24-48 hours.  Strict return precautions discussed. Patient expresses understanding and agreement.   Final Clinical Impressions(s) / ED Diagnoses   Final diagnoses:  Left leg pain  Open leg wound, left, initial encounter     New Prescriptions New Prescriptions   CEPHALEXIN (KEFLEX) 500 MG CAPSULE    Take 1 capsule (500 mg total) by mouth 3 (three) times daily.     Volanda Napoleon, PA-C 07/22/16 1616    Macarthur Critchley, MD 07/24/16 [phone]

## 2016-07-24 ENCOUNTER — Ambulatory Visit: Payer: Medicare Other | Admitting: Internal Medicine

## 2016-07-25 NOTE — Progress Notes (Signed)
ARIS, EVEN ([id number]) Visit Report for 07/24/2016 Abuse/Suicide Risk Screen Details Patient Name: Justin Molina, Justin Molina Date of Service: 07/24/2016 8:00 AM Medical Record Patient Account Number: [id number] Number: Treating RN: Curtis Sites [birth year] (56 y.o. Other Clinician: Date of Birth/Sex: Male) Treating ROBSON, MICHAEL Primary Care Tyrell Brereton: Edrie Ehrich/Extender: G Referring Malonie Tatum: Weeks in Treatment: 0 Abuse/Suicide Risk Screen Items Answer ABUSE/SUICIDE RISK SCREEN: Has anyone close to you tried to hurt or harm you recentlyo No Do you feel uncomfortable with anyone in your familyo No Has anyone forced you do things that you didnot want to doo No Do you have any thoughts of harming yourselfo No Patient displays signs or symptoms of abuse and/or neglect. No Electronic Signature(s) Signed: 07/24/2016 5:17:10 PM By: Curtis Sites Entered By: Curtis Sites on 07/24/2016 08:07:17 Justin Molina ([id number]) -------------------------------------------------------------------------------- Activities of Daily Living Details Patient Name: Justin Molina Date of Service: 07/24/2016 8:00 AM Medical Record Patient Account Number: [id number] Number: Treating RN: Curtis Sites 1961-01-10 (56 y.o. Other Clinician: Date of Birth/Sex: Male) Treating ROBSON, MICHAEL Primary Care Jerad Dunlap: Lunell Robart/Extender: G Referring Monika Chestang: Weeks in Treatment: 0 Activities of Daily Living Items Answer Activities of Daily Living (Please select one for each item) Drive Automobile Completely Able Take Medications Completely Able Use Telephone Completely Able Care for Appearance Completely Able Use Toilet Completely Able Bath / Shower Completely Able Dress Self Completely Able Feed Self Completely Able Walk Completely Able Get In / Out Bed Completely Able Housework Completely Able Prepare Meals Completely Able Handle Money Completely Able Shop for Self  Completely Able Electronic Signature(s) Signed: 07/24/2016 5:17:10 PM By: Curtis Sites Entered By: Curtis Sites on 07/24/2016 08:07:32 Justin Molina ([id number]) -------------------------------------------------------------------------------- Education Assessment Details Patient Name: Justin Molina Date of Service: 07/24/2016 8:00 AM Medical Record Patient Account Number: [id number] Number: Treating RN: Curtis Sites 11-04-1960 (56 y.o. Other Clinician: Date of Birth/Sex: Male) Treating ROBSON, MICHAEL Primary Care Ranjit Ashurst: Maeli Spacek/Extender: G Referring Jemaine Prokop: Tania Ade in Treatment: 0 Primary Learner Assessed: Patient Learning Preferences/Education Level/Primary Language Learning Preference: Explanation, Demonstration Highest Education Level: College or Above Preferred Language: English Cognitive Barrier Assessment/Beliefs Language Barrier: No Translator Needed: No Memory Deficit: No Emotional Barrier: No Cultural/Religious Beliefs Affecting Medical No Care: Physical Barrier Assessment Impaired Vision: No Impaired Hearing: No Decreased Hand dexterity: No Knowledge/Comprehension Assessment Knowledge Level: Medium Comprehension Level: Medium Ability to understand written Medium instructions: Ability to understand verbal Medium instructions: Motivation Assessment Anxiety Level: Calm Cooperation: Cooperative Education Importance: Acknowledges Need Interest in Health Problems: Asks Questions Perception: Coherent Willingness to Engage in Self- Medium Management Activities: Readiness to Engage in Self- Medium Management Activities: Justin Molina, Justin Molina ([id number]) Electronic Signature(s) Signed: 07/24/2016 5:17:10 PM By: Curtis Sites Entered By: Curtis Sites on 07/24/2016 08:07:52 Justin Molina, Justin Molina ([id number]) -------------------------------------------------------------------------------- Fall Risk Assessment Details Patient Name: Justin Molina Date of Service: 07/24/2016 8:00 AM Medical Record Patient Account Number: [id number] Number: Treating RN: Curtis Sites Jun 29, 1960 (56 y.o. Other Clinician: Date of Birth/Sex: Male) Treating ROBSON, MICHAEL Primary Care Keston Seever: Kenny Stern/Extender: G Referring Naiyah Klostermann: Weeks in Treatment: 0 Fall Risk Assessment Items Have you had 2 or more falls in the last 12 monthso 0 No Have you had any fall that resulted in injury in the last 12 monthso 0 No FALL RISK ASSESSMENT: History of falling - immediate or within 3 months 0 No Secondary diagnosis 0 No Ambulatory aid None/bed rest/wheelchair/nurse 0 Yes Crutches/cane/walker 0 No Furniture 0 No IV Access/Saline Lock 0 No Gait/Training Normal/bed rest/immobile 0 No Weak 10 Yes Impaired 0 No Mental Status Oriented  to own ability 0 Yes Electronic Signature(s) Signed: 07/24/2016 5:17:10 PM By: Curtis Sitesorthy, Joanna Entered By: Curtis Sitesorthy, Joanna on 07/24/2016 08:08:03 Justin EeRODGERS, Justin Molina ([id number]) -------------------------------------------------------------------------------- Nutrition Risk Assessment Details Patient Name: Justin EeODGERS, Justin Molina Date of Service: 07/24/2016 8:00 AM Medical Record Patient Account Number: [id number] Number: Treating RN: Curtis SitesDorthy, Joanna 05/24/1960 (56 y.o. Other Clinician: Date of Birth/Sex: Male) Treating ROBSON, MICHAEL Primary Care Braysen Cloward: Catrell Morrone/Extender: G Referring Quiana Cobaugh: Weeks in Treatment: 0 Height (in): Weight (lbs): Body Mass Index (BMI): Nutrition Risk Assessment Items NUTRITION RISK SCREEN: I have an illness or condition that made me change the kind and/or 0 No amount of food I eat I eat fewer than two meals per day 0 No I eat few fruits and vegetables, or milk products 0 No I have three or more drinks of beer, liquor or wine almost every day 0 No I have tooth or mouth problems that make it hard for me to eat 0 No I don't always have enough money to buy the food  I need 0 No I eat alone most of the time 0 No I take three or more different prescribed or over-the-counter drugs a 1 Yes day Without wanting to, I have lost or gained 10 pounds in the last six 0 No months I am not always physically able to shop, cook and/or feed myself 0 No Nutrition Protocols Good Risk Protocol 0 No interventions needed Moderate Risk Protocol Electronic Signature(s) Signed: 07/24/2016 5:17:10 PM By: Curtis Sitesorthy, Joanna Entered By: Curtis Sitesorthy, Joanna on 07/24/2016 08:08:09

## 2016-07-25 NOTE — Progress Notes (Signed)
Justin Molina, [name] ([id number]) Visit Report for 07/24/2016 Allergy List Details Patient Name: Justin Molina, Justin Molina Date of Service: 07/24/2016 8:00 AM Medical Record Patient Account Number: [id number] Number: Treating RN: Curtis SitesDorthy, Joanna April 10, 1960 (55 y.o. Other Clinician: Date of Birth/Sex: Male) Treating ROBSON, MICHAEL Primary Care Nyleah Mcginnis: Derran Sear/Extender: G Referring Tian Mcmurtrey: Weeks in Treatment: 0 Allergies Active Allergies Iodinated Contrast- Oral and IV Dye Allergy Notes Electronic Signature(s) Signed: 07/24/2016 5:17:10 PM By: Curtis Sitesorthy, Joanna Entered By: Curtis Sitesorthy, Joanna on 07/24/2016 08:07:07

## 2016-07-30 ENCOUNTER — Ambulatory Visit: Payer: Medicare Other | Admitting: Physician Assistant

## 2016-08-03 ENCOUNTER — Encounter: Payer: Medicare (Managed Care) | Attending: Surgery | Admitting: Surgery

## 2016-08-03 DIAGNOSIS — I10 Essential (primary) hypertension: Secondary | ICD-10-CM | POA: Insufficient documentation

## 2016-08-03 DIAGNOSIS — I890 Lymphedema, not elsewhere classified: Secondary | ICD-10-CM | POA: Insufficient documentation

## 2016-08-03 DIAGNOSIS — S81811A Laceration without foreign body, right lower leg, initial encounter: Secondary | ICD-10-CM | POA: Insufficient documentation

## 2016-08-03 DIAGNOSIS — Z8673 Personal history of transient ischemic attack (TIA), and cerebral infarction without residual deficits: Secondary | ICD-10-CM | POA: Insufficient documentation

## 2016-08-03 DIAGNOSIS — L97212 Non-pressure chronic ulcer of right calf with fat layer exposed: Secondary | ICD-10-CM | POA: Diagnosis not present

## 2016-08-03 DIAGNOSIS — Z794 Long term (current) use of insulin: Secondary | ICD-10-CM | POA: Insufficient documentation

## 2016-08-03 DIAGNOSIS — G468 Other vascular syndromes of brain in cerebrovascular diseases: Secondary | ICD-10-CM | POA: Insufficient documentation

## 2016-08-03 DIAGNOSIS — X58XXXA Exposure to other specified factors, initial encounter: Secondary | ICD-10-CM | POA: Insufficient documentation

## 2016-08-03 DIAGNOSIS — Z79899 Other long term (current) drug therapy: Secondary | ICD-10-CM | POA: Diagnosis not present

## 2016-08-03 DIAGNOSIS — I872 Venous insufficiency (chronic) (peripheral): Secondary | ICD-10-CM | POA: Diagnosis not present

## 2016-08-03 DIAGNOSIS — I739 Peripheral vascular disease, unspecified: Secondary | ICD-10-CM | POA: Diagnosis not present

## 2016-08-03 DIAGNOSIS — E11622 Type 2 diabetes mellitus with other skin ulcer: Secondary | ICD-10-CM | POA: Insufficient documentation

## 2016-08-05 NOTE — Progress Notes (Signed)
Justin Molina, Justin Molina ([id number]) Visit Report for 08/03/2016 Allergy List Details Patient Name: Justin Molina, Justin Molina Date of Service: 08/03/2016 12:45 PM Medical Record Number: [id number] Patient Account Number: [id number] Date of Birth/Sex: 03-May-1960 [age] y.o. Male) Treating RN: Clover MealyAfful, RN, BSN, [place] [facility] Primary [facility] Peightyn Roberson: SYSTEM, PCP Other Clinician: Referring Daylan Juhnke: Bary CastillaMACKUEN, COURTENEY Treating Britley Gashi/Extender: Rudene ReBritto, Errol Weeks in Treatment: 0 Allergies Active Allergies Iodinated Contrast- Oral and IV Dye Allergy Notes Electronic Signature(s) Signed: 08/03/2016 4:45:30 PM By: Elpidio EricAfful, Rita BSN, RN Entered By: Elpidio EricAfful, Rita on 08/03/2016 14:35:40 Justin Molina, Justin Molina ([id number]) -------------------------------------------------------------------------------- Arrival Information Details Patient Name: Justin Molina, Justin Molina Date of Service: 08/03/2016 12:45 PM Medical Record Number: [id number] Patient Account Number: [id number] Date of Birth/Sex: 03-May-1960 [age] y.o. Male) Treating RN: Clover MealyAfful, RN, BSN, [employer] Primary Care Phinehas Grounds: SYSTEM, PCP Other Clinician: Referring Haillie Radu: Bary CastillaMACKUEN, COURTENEY Treating Lanisha Stepanian/Extender: Rudene ReBritto, Errol Weeks in Treatment: 0 Visit Information Patient Arrived: Danella Maiersane Arrival Time: 14:13 Accompanied By: wife Transfer Assistance: None Patient Identification Verified: Yes Secondary Verification Process Yes Completed: Patient Requires Transmission- No Based Precautions: Patient Has Alerts: Yes Patient Alerts: Patient on Blood Thinner Electronic Signature(s) Signed: 08/03/2016 4:45:30 PM By: Elpidio EricAfful, Rita BSN, RN Entered By: Elpidio EricAfful, Rita on 08/03/2016 14:35:14 Justin Molina, Justin Molina ([id number]) -------------------------------------------------------------------------------- Clinic Level of Care Assessment Details Patient Name: Justin Molina, Justin Molina Date of Service: 08/03/2016 12:45 PM Medical Record Number: [id number] Patient Account Number: [id number] Date of Birth/Sex:  03-May-1960 (56 y.o. Male) Treating RN: Clover MealyAfful, RN, BSN, [employer] Primary Care Daenerys Buttram: SYSTEM, PCP Other Clinician: Referring Colie Fugitt: Bary CastillaMACKUEN, COURTENEY Treating Jarek Longton/Extender: Rudene ReBritto, Errol Weeks in Treatment: 0 Clinic Level of Care Assessment Items TOOL 1 Quantity Score []  - Use when EandM and Procedure is performed on INITIAL visit 0 ASSESSMENTS - Nursing Assessment / Reassessment X - General Physical Exam (combine w/ comprehensive assessment (listed just 1 20 below) when performed on new pt. evals) X - Comprehensive Assessment (HX, ROS, Risk Assessments, Wounds Hx, etc.) 1 25 ASSESSMENTS - Wound and Skin Assessment / Reassessment []  - Dermatologic / Skin Assessment (not related to wound area) 0 ASSESSMENTS - Ostomy and/or Continence Assessment and Care []  - Incontinence Assessment and Management 0 []  - Ostomy Care Assessment and Management (repouching, etc.) 0 PROCESS - Coordination of Care X - Simple Patient / Family Education for ongoing care 1 15 []  - Complex (extensive) Patient / Family Education for ongoing care 0 X - Staff obtains ChiropractorConsents, Records, Test Results / Process Orders 1 10 X - Staff telephones HHA, Nursing Homes / Clarify orders / etc 1 10 []  - Routine Transfer to another Facility (non-emergent condition) 0 []  - Routine Hospital Admission (non-emergent condition) 0 X - New Admissions / Manufacturing engineernsurance Authorizations / Ordering NPWT, Apligraf, etc. 1 15 []  - Emergency Hospital Admission (emergent condition) 0 PROCESS - Special Needs []  - Pediatric / Minor Patient Management 0 []  - Isolation Patient Management 0 Justin Molina, Justin Molina ([id number]) []  - Hearing / Language / Visual special needs 0 []  - Assessment of Community assistance (transportation, D/C planning, etc.) 0 []  - Additional assistance / Altered mentation 0 []  - Support Surface(s) Assessment (bed, cushion, seat, etc.) 0 INTERVENTIONS - Miscellaneous []  - External ear exam 0 []  - Patient Transfer (multiple  staff / Nurse, adultHoyer Lift / Similar devices) 0 []  - Simple Staple / Suture removal (25 or less) 0 []  - Complex Staple / Suture removal (26 or more) 0 []  - Hypo/Hyperglycemic Management (do not check if billed separately) 0 X - Ankle / Brachial Index (ABI) - do not check if billed separately 1 15 Has  the patient been seen at the hospital within the last three years: Yes Total Score: 110 Level Of Care: New/Established - Level 3 Electronic Signature(s) Signed: 08/03/2016 4:45:30 PM By: Elpidio Eric BSN, RN Entered By: Elpidio Eric on 08/03/2016 15:33:08 Justin Molina ([id number]) -------------------------------------------------------------------------------- Encounter Discharge Information Details Patient Name: Justin Molina Date of Service: 08/03/2016 12:45 PM Medical Record Number: [id number] Patient Account Number: [id number] Date of Birth/Sex: April 27, 1960 (56 y.o. Male) Treating RN: Clover Mealy, RN, BSN, [employer] Primary Care Tineshia Becraft: SYSTEM, PCP Other Clinician: Referring Naethan Bracewell: Bary Castilla Treating Yadira Hada/Extender: Rudene Re in Treatment: 0 Encounter Discharge Information Items Discharge Pain Level: 0 Discharge Condition: Stable Ambulatory Status: Walker Discharge Destination: Home Transportation: Private Auto Accompanied By: wife Schedule Follow-up Appointment: No Medication Reconciliation completed No and provided to Patient/Care Adriene Knipfer: Provided on Clinical Summary of Care: 08/03/2016 Form Type Recipient Paper Patient RR Electronic Signature(s) Signed: 08/03/2016 3:35:04 PM By: Elpidio Eric BSN, RN Previous Signature: 08/03/2016 3:05:14 PM Version By: Gwenlyn Perking Entered By: Elpidio Eric on 08/03/2016 15:35:04 Justin Molina ([id number]) -------------------------------------------------------------------------------- Lower Extremity Assessment Details Patient Name: Justin Molina Date of Service: 08/03/2016 12:45 PM Medical Record Number:  [id number] Patient Account Number: [id number] Date of Birth/Sex: [birth year] (56 y.o. Male) Treating RN: Clover Mealy, RN, BSN, [employer] Primary Care Glynda Soliday: SYSTEM, PCP Other Clinician: Referring Shamon Lobo: Bary Castilla Treating Brie Eppard/Extender: Rudene Re in Treatment: 0 Edema Assessment Assessed: [Left: No] [Right: No] E[Left: dema] [Right: :] Calf Left: Right: Point of Measurement: 42 cm From Medial Instep 53.8 cm cm Ankle Left: Right: Point of Measurement: 11 cm From Medial Instep 29.5 cm cm Vascular Assessment Claudication: Claudication Assessment [Left:None] Pulses: Dorsalis Pedis Palpable: [Left:Yes] Posterior Tibial Extremity colors, hair growth, and conditions: Extremity Color: [Left:Hyperpigmented] Hair Growth on Extremity: [Left:Yes] Temperature of Extremity: [Left:Warm] Capillary Refill: [Left:< 3 seconds] Blood Pressure: Brachial: [Right:151] Dorsalis Pedis: [Left:Dorsalis Pedis: 216] Ankle: Posterior Tibial: [Left:Posterior Tibial: 170] [Right:1.43] Toe Nail Assessment Left: Right: Thick: Yes Discolored: Yes Deformed: Yes Improper Length and Hygiene: Yes KYLIN, DUBS ([id number]) Electronic Signature(s) Signed: 08/03/2016 4:45:30 PM By: Elpidio Eric BSN, RN Entered By: Elpidio Eric on 08/03/2016 14:50:51 Justin Molina ([id number]) -------------------------------------------------------------------------------- Multi Wound Chart Details Patient Name: Justin Molina Date of Service: 08/03/2016 12:45 PM Medical Record Number: [id number] Patient Account Number: [id number] Date of Birth/Sex: Aug 19, 1960 (56 y.o. Male) Treating RN: Clover Mealy, RN, BSN, [place] Primary Care Gabby Rackers: SYSTEM, PCP Other Clinician: Referring Bevan Disney: Bary Castilla Treating Teniyah Seivert/Extender: Rudene Re in Treatment: 0 Vital Signs Height(in): Pulse(bpm): 82 Weight(lbs): 211 Blood Pressure 151/92 (mmHg): Body Mass Index(BMI): Temperature(F):  98.1 Respiratory Rate 18 (breaths/min): Photos: [N/A:N/A] Wound Location: Right Lower Leg N/A N/A Wounding Event: Gradually Appeared N/A N/A Primary Etiology: Venous Leg Ulcer N/A N/A Date Acquired: 06/12/2016 N/A N/A Weeks of Treatment: 0 N/A N/A Wound Status: Open N/A N/A Measurements L x W x D 3x1.8x1 N/A N/A (cm) Area (cm) : 4.241 N/A N/A Volume (cm) : 4.241 N/A N/A Classification: Full Thickness Without N/A N/A Exposed Support Structures Exudate Amount: Large N/A N/A Exudate Type: Serosanguineous N/A N/A Exudate Color: red, brown N/A N/A Wound Margin: Distinct, outline attached N/A N/A Granulation Amount: Small (1-33%) N/A N/A Granulation Quality: Pink N/A N/A Necrotic Amount: Large (67-100%) N/A N/A Necrotic Tissue: Eschar, Adherent Slough N/A N/A Exposed Structures: Fat Layer (Subcutaneous N/A N/A Tissue) Exposed: Yes Stansbury, Gerlene Burdock ([id number]) Fascia: No Tendon: No Muscle: No Joint: No Bone: No Epithelialization: None N/A N/A Debridement: Debridement ([id number]- N/A N/A 11047) Pre-procedure 14:51 N/A N/A Verification/Time Out Taken: Pain Control: Lidocaine  4% Topical N/A N/A Solution Tissue Debrided: Necrotic/Eschar, N/A N/A Fibrin/Slough, Fat, Exudates, Subcutaneous Level: Skin/Subcutaneous N/A N/A Tissue Debridement Area (sq 5.4 N/A N/A cm): Instrument: Curette N/A N/A Bleeding: Minimum N/A N/A Hemostasis Achieved: Pressure N/A N/A Procedural Pain: 2 N/A N/A Post Procedural Pain: 2 N/A N/A Debridement Treatment Procedure was tolerated N/A N/A Response: well Post Debridement 3x1.8x1 N/A N/A Measurements L x W x D (cm) Post Debridement 4.241 N/A N/A Volume: (cm) Periwound Skin Texture: Induration: Yes N/A N/A Excoriation: No Callus: No Crepitus: No Rash: No Scarring: No Periwound Skin Maceration: No N/A N/A Moisture: Dry/Scaly: No Periwound Skin Color: Ecchymosis: Yes N/A N/A Hemosiderin Staining: Yes Mottled: Yes Atrophie Blanche:  No Cyanosis: No Erythema: No Pallor: No Rubor: No Temperature: Hot N/A N/A Yes N/A N/A TAYVION, LAUDER ([id number]) Tenderness on Palpation: Wound Preparation: Ulcer Cleansing: N/A N/A Rinsed/Irrigated with Saline Topical Anesthetic Applied: Other: lidocaine 4% Procedures Performed: Debridement N/A N/A Treatment Notes Electronic Signature(s) Signed: 08/03/2016 3:32:17 PM By: Evlyn Kanner MD, FACS Previous Signature: 08/03/2016 3:30:34 PM Version By: Elpidio Eric BSN, RN Entered By: Evlyn Kanner on 08/03/2016 15:32:17 Justin Molina ([id number]) -------------------------------------------------------------------------------- Multi-Disciplinary Care Plan Details Patient Name: Justin Molina Date of Service: 08/03/2016 12:45 PM Medical Record Number: [id number] Patient Account Number: [id number] Date of Birth/Sex: [birth year] (56 y.o. Male) Treating RN: Clover Mealy, RN, BSN, [employer] Primary Care Lenell Mcconnell: SYSTEM, PCP Other Clinician: Referring Myrel Rappleye: Bary Castilla Treating Tremond Shimabukuro/Extender: Rudene Re in Treatment: 0 Active Inactive ` Abuse / Safety / Falls / Self Care Management Nursing Diagnoses: History of Falls Impaired home maintenance Impaired physical mobility Knowledge deficit related to: safety; personal, health (wound), emergency Potential for falls Potential for injury related to falls Potential for injury related to transfers Self care deficit: actual or potential Goals: Patient will not develop complications from immobility Date Initiated: 08/03/2016 Target Resolution Date: 11/03/2016 Goal Status: Active Patient will not experience any injury related to falls Date Initiated: 08/03/2016 Target Resolution Date: 11/03/2016 Goal Status: Active Patient will remain injury free related to falls Date Initiated: 08/03/2016 Target Resolution Date: 11/03/2016 Goal Status: Active Patient/caregiver will demonstrate safe use of adaptive devices to increase  mobility Date Initiated: 08/03/2016 Target Resolution Date: 11/03/2016 Goal Status: Active Patient/caregiver will identify factors that restrict self-care and home management Date Initiated: 08/03/2016 Target Resolution Date: 11/03/2016 Goal Status: Active Patient/caregiver will verbalize understanding of skin care regimen Date Initiated: 08/03/2016 Target Resolution Date: 11/03/2016 Goal Status: Active Patient/caregiver will verbalize understanding of the importance to maintain current immunizations/vaccinations Date Initiated: 08/03/2016 Target Resolution Date: 11/03/2016 LINTON, STOLP ([id number]) Goal Status: Active Patient/caregiver will verbalize/demonstrate measure taken to improve self care Date Initiated: 08/03/2016 Target Resolution Date: 11/03/2016 Goal Status: Active Patient/caregiver will verbalize/demonstrate measures taken to improve the patient's personal safety Date Initiated: 08/03/2016 Target Resolution Date: 11/03/2016 Goal Status: Active Patient/caregiver will verbalize/demonstrate measures taken to prevent injury and/or falls Date Initiated: 08/03/2016 Target Resolution Date: 11/03/2016 Goal Status: Active Patient/caregiver will verbalize/demonstrate understanding of what to do in case of emergency Date Initiated: 08/03/2016 Target Resolution Date: 11/03/2016 Goal Status: Active Interventions: Call light and/or bell within patient's reach Assess Activities of Daily Living upon admission and as needed Assess fall risk on admission and as needed Assess: immobility, friction, shearing, incontinence upon admission and as needed Assess impairment of mobility on admission and as needed per policy Assess personal safety and home safety (as indicated) on admission and as needed Assess self care needs on admission and as needed Patient/Caregiver referred to community resources (specify in  notes) Provide education on basic hygiene Provide education on fall  prevention Provide education on personal and home safety Provide education on safe transfers Notes: ` Orientation to the Wound Care Program Nursing Diagnoses: Knowledge deficit related to the wound healing center program Goals: Patient/caregiver will verbalize understanding of the Wound Healing Center Program Date Initiated: 08/03/2016 Target Resolution Date: 11/03/2016 Goal Status: Active Interventions: Provide education on orientation to the wound center Notes: RONI, SCOW ([id number]) ` Venous Leg Ulcer Nursing Diagnoses: Knowledge deficit related to disease process and management Potential for venous Insuffiency (use before diagnosis confirmed) Goals: Non-invasive venous studies are completed as ordered Date Initiated: 08/03/2016 Target Resolution Date: 11/03/2016 Goal Status: Active Patient will maintain optimal edema control Date Initiated: 08/03/2016 Target Resolution Date: 11/03/2016 Goal Status: Active Patient/caregiver will verbalize understanding of disease process and disease management Date Initiated: 08/03/2016 Target Resolution Date: 11/03/2016 Goal Status: Active Verify adequate tissue perfusion prior to therapeutic compression application Date Initiated: 08/03/2016 Target Resolution Date: 11/03/2016 Goal Status: Active Interventions: Assess peripheral edema status every visit. Provide education on venous insufficiency Treatment Activities: Non-invasive vascular studies : 08/03/2016 Test ordered outside of clinic : 08/03/2016 Venous Duplex Doppler : 08/03/2016 Notes: ` Wound/Skin Impairment Nursing Diagnoses: Impaired tissue integrity Knowledge deficit related to ulceration/compromised skin integrity Goals: Patient/caregiver will verbalize understanding of skin care regimen Date Initiated: 08/03/2016 Target Resolution Date: 11/03/2016 Goal Status: Active Ulcer/skin breakdown will have a volume reduction of 30% by week 4 Date Initiated: 08/03/2016 Target  Resolution Date: 11/03/2016 NYLAN, NEVEL ([id number]) Goal Status: Active Ulcer/skin breakdown will have a volume reduction of 50% by week 8 Date Initiated: 08/03/2016 Target Resolution Date: 11/03/2016 Goal Status: Active Ulcer/skin breakdown will have a volume reduction of 80% by week 12 Date Initiated: 08/03/2016 Target Resolution Date: 11/03/2016 Goal Status: Active Ulcer/skin breakdown will heal within 14 weeks Date Initiated: 08/03/2016 Target Resolution Date: 11/03/2016 Goal Status: Active Interventions: Assess patient/caregiver ability to obtain necessary supplies Assess patient/caregiver ability to perform ulcer/skin care regimen upon admission and as needed Assess ulceration(s) every visit Provide education on ulcer and skin care Treatment Activities: Referred to DME Kesean Serviss for dressing supplies : 08/03/2016 Skin care regimen initiated : 08/03/2016 Topical wound management initiated : 08/03/2016 Notes: Electronic Signature(s) Signed: 08/03/2016 3:30:08 PM By: Elpidio Eric BSN, RN Entered By: Elpidio Eric on 08/03/2016 15:30:07 Justin Molina ([id number]) -------------------------------------------------------------------------------- Pain Assessment Details Patient Name: Justin Molina Date of Service: 08/03/2016 12:45 PM Medical Record Number: [id number] Patient Account Number: [id number] Date of Birth/Sex: January 14, 1961 (56 y.o. Male) Treating RN: Clover Mealy, RN, BSN, Rita Primary Care Trinten Boudoin: SYSTEM, PCP Other Clinician: Referring Masami Plata: Bary Castilla Treating Tiberius Loftus/Extender: Rudene Re in Treatment: 0 Active Problems Location of Pain Severity and Description of Pain Patient Has Paino No Site Locations With Dressing Change: No Pain Management and Medication Current Pain Management: Electronic Signature(s) Signed: 08/03/2016 4:45:30 PM By: Elpidio Eric BSN, RN Entered By: Elpidio Eric on 08/03/2016 14:35:21 Justin Molina  ([id number]) -------------------------------------------------------------------------------- Patient/Caregiver Education Details Patient Name: Justin Molina Date of Service: 08/03/2016 12:45 PM Medical Record Number: [id number] Patient Account Number: [id number] Date of Birth/Gender: November 30, 1960 (56 y.o. Male) Treating RN: Clover Mealy, RN, BSN, Rita Primary Care Physician: SYSTEM, PCP Other Clinician: Referring Physician: Bary Castilla Treating Physician/Extender: Rudene Re in Treatment: 0 Education Assessment Education Provided To: Patient Education Topics Provided Basic Hygiene: Methods: Explain/Verbal Responses: State content correctly Safety: Methods: Explain/Verbal Responses: State content correctly Venous: Methods: Explain/Verbal Responses: State content correctly Welcome To The W[facility] Center: Methods: Explain/Verbal Responses: State  content correctly Wound/Skin Impairment: Methods: Explain/Verbal Responses: State content correctly Electronic Signature(s) Signed: 08/03/2016 4:45:30 PM By: Elpidio Eric BSN, RN Entered By: Elpidio Eric on 08/03/2016 15:35:32 Justin Molina ([id number]) -------------------------------------------------------------------------------- Wound Assessment Details Patient Name: Justin Molina Date of Service: 08/03/2016 12:45 PM Medical Record Number: [id number] Patient Account Number: [id number] Date of Birth/Sex: 26-Dec-1960 (56 y.o. Male) Treating RN: Clover Mealy, RN, BSN, [employer] Primary Care Babbette Dalesandro: SYSTEM, PCP Other Clinician: Referring Hallis Meditz: Bary Castilla Treating Sharmila Wrobleski/Extender: Rudene Re in Treatment: 0 Wound Status Wound Number: 1 Primary Trauma, Other Etiology: Wound Location: Right Lower Leg Wound Open Wounding Event: Gradually Appeared Status: Date Acquired: 06/12/2016 Comorbid Anemia, Arrhythmia, Hypertension, Weeks Of Treatment: 0 History: Peripheral Venous Disease, Type II Clustered Wound:  No Diabetes Photos Wound Measurements Length: (cm) 3 Width: (cm) 1.8 Depth: (cm) 1 Area: (cm) 4.241 Volume: (cm) 4.241 % Reduction in Area: 0% % Reduction in Volume: 0% Epithelialization: None Tunneling: No Undermining: No Wound Description Full Thickness Without Exposed Foul Odor Af Classification: Support Structures Slough/Fibri Diabetic Severity Grade 1 (Wagner): Wound Margin: Distinct, outline attached Exudate Amount: Large Exudate Type: Serosanguineous Exudate Color: red, brown ter Cleansing: No no Yes Wound Bed Granulation Amount: Small (1-33%) Exposed Structure Granulation Quality: Pink Fascia Exposed: No Maurin, Rubel ([id number]) Necrotic Amount: Large (67-100%) Fat Layer (Subcutaneous Tissue) Exposed: Yes Necrotic Quality: Eschar, Adherent Slough Tendon Exposed: No Muscle Exposed: No Joint Exposed: No Bone Exposed: No Periwound Skin Texture Texture Color No Abnormalities Noted: No No Abnormalities Noted: No Callus: No Atrophie Blanche: No Crepitus: No Cyanosis: No Excoriation: No Ecchymosis: Yes Induration: Yes Erythema: No Rash: No Hemosiderin Staining: Yes Scarring: No Mottled: Yes Pallor: No Moisture Rubor: No No Abnormalities Noted: No Dry / Scaly: No Temperature / Pain Maceration: No Temperature: Hot Tenderness on Palpation: Yes Wound Preparation Ulcer Cleansing: Rinsed/Irrigated with Saline Topical Anesthetic Applied: Other: lidocaine 4%, Treatment Notes Wound #1 (Right Lower Leg) 1. Cleansed with: Clean wound with Normal Saline 4. Dressing Applied: Santyl Ointment 5. Secondary Dressing Applied Gauze and Kerlix/Conform 7. Secured with Tape Other (specify in notes) Notes lightly secure with coban Electronic Signature(s) Signed: 08/03/2016 3:37:27 PM By: Elpidio Eric BSN, RN Entered By: Elpidio Eric on 08/03/2016 15:37:26 Justin Molina  ([id number]) -------------------------------------------------------------------------------- Vitals Details Patient Name: Justin Molina Date of Service: 08/03/2016 12:45 PM Medical Record Number: [id number] Patient Account Number: [id number] Date of Birth/Sex: 1960/10/09 (56 y.o. Male) Treating RN: Clover Mealy, RN, BSN, Rita Primary Care Maison Agrusa: SYSTEM, PCP Other Clinician: Referring Demarquis Osley: Bary Castilla Treating Jhoanna Heyde/Extender: Rudene Re in Treatment: 0 Vital Signs Time Taken: 14:18 Temperature (F): 98.1 Weight (lbs): 211 Pulse (bpm): 82 Source: Stated Respiratory Rate (breaths/min): 18 Blood Pressure (mmHg): 151/92 Reference Range: 80 - 120 mg / dl Electronic Signature(s) Signed: 08/03/2016 4:45:30 PM By: Elpidio Eric BSN, RN Entered By: Elpidio Eric on 08/03/2016 14:35:26

## 2016-08-05 NOTE — Progress Notes (Signed)
Justin Molina, Justin Molina ([id number]) Visit Report for 08/03/2016 Abuse/Suicide Risk Screen Details Patient Name: Justin Molina, Justin Molina Date of Service: 08/03/2016 12:45 PM Medical Record Number: [id number] Patient Account Number: [id number] Date of Birth/Sex: 1960-11-10 [age] y.o. Male) Treating Molina: Justin Molina, [place] Justin Molina Primary Care Justin Justin Molina Other Clinician: Referring Justin Molina: Justin Molina Treating Fey Coghill/Extender: Justin ReBritto, Justin Molina: 0 Abuse/Suicide Risk Screen Items Answer ABUSE/SUICIDE RISK SCREEN: Has anyone close to you tried to hurt or harm you recentlyo No Do you feel uncomfortable with anyone in your familyo No Has anyone forced you do things that you didnot want to doo No Do you have any thoughts of harming yourselfo No Patient displays signs or symptoms of abuse and/or neglect. No Electronic Signature(s) Signed: 08/03/2016 4:45:30 PM By: Justin Molina BSN, Molina Entered By: Justin Molina on 08/03/2016 14:35:54 Justin Molina, Justin Molina ([id number]) -------------------------------------------------------------------------------- Activities of Daily Living Details Patient Name: Justin Molina, Justin Molina Date of Service: 08/03/2016 12:45 PM Medical Record Number: [id number] Patient Account Number: [id number] Date of Birth/Sex: 1960-11-10 [age] y.o. Male) Treating Molina: Justin Molina, [place] Justin Molina Primary Care Justin Molina Other Clinician: Referring Justin Molina: Justin Molina Treating Justin Molina Weeks in Molina: 0 Activities of Daily Living Items Answer Activities of Daily Living (Please select one for each item) Drive Automobile Need Assistance Take Medications Need Assistance Use Telephone Need Assistance Care for Appearance Need Assistance Use Toilet Need Assistance Bath / Shower Need Assistance Dress Self Need Assistance Feed Self Need Assistance Walk Need Assistance Get In / Out Bed Need Assistance Housework Need Assistance Prepare Meals  Need Assistance Handle Money Need Assistance Shop for Self Need Assistance Electronic Signature(s) Signed: 08/03/2016 4:45:30 PM By: Justin Molina BSN, Molina Entered By: Justin Molina on 08/03/2016 14:35:59 Justin Molina, Justin Molina ([id number]) -------------------------------------------------------------------------------- Education Assessment Details Patient Name: Justin Molina, Justin Molina Date of Service: 08/03/2016 12:45 PM Medical Record Number: [id number] Patient Account Number: [id number] Date of Birth/Sex: 1960-11-10 [age] y.o. Male) Treating Molina: Justin Molina, Justin Molina Other Clinician: Referring Justin Molina Treating Kaleeyah Cuffie/Extender: Justin ReBritto, Justin Molina: 0 Primary Learner Assessed: Patient Learning Preferences/Education Level/Primary Language Learning Preference: Explanation Highest Education Level: College or Above Preferred Language: English Cognitive Barrier Assessment/Beliefs Language Barrier: No Physical Barrier Assessment Impaired Vision: Yes Glasses Impaired Hearing: No Decreased Hand dexterity: Yes Knowledge/Comprehension Assessment Knowledge Level: Medium Comprehension Level: Medium Ability to understand written Medium instructions: Ability to understand verbal Medium instructions: Motivation Assessment Anxiety Level: Calm Cooperation: Cooperative Education Importance: Acknowledges Need Interest in Health Problems: Uninterested Perception: Coherent Willingness to Engage in Self- Medium Management Activities: Readiness to Engage in Self- Medium Management Activities: Electronic Signature(s) Signed: 08/03/2016 4:45:30 PM By: Justin Molina BSN, Molina Entered By: Justin Molina on 08/03/2016 14:36:06 Justin Molina, Justin Molina ([id number]) -------------------------------------------------------------------------------- Fall Risk Assessment Details Patient Name: Justin Molina, Justin Molina Date of Service: 08/03/2016 12:45 PM Medical Record  Number: [id number] Patient Account Number: [id number] Date of Birth/Sex: 1960-11-10 [age] y.o. Male) Treating Molina: Justin Molina, Justin [employer] Primary Care Justin Molina Other Clinician: Referring Justin Molina Treating Justin Molina Weeks in Molina: 0 Fall Risk Assessment Items Have you had 2 or more falls in the last 12 monthso 0 Yes Have you had any fall that resulted in injury in the last 12 monthso 0 No FALL RISK ASSESSMENT: History of falling - immediate or within 3 months 25 Yes Secondary diagnosis 0 No Ambulatory aid None/bed rest/wheelchair/nurse 0 No Crutches/cane/walker 15 Yes Furniture 0 No IV Access/Saline Lock 0 No Gait/Training Normal/bed rest/immobile 0 Yes Weak 0  No Impaired 20 Yes Mental Status Oriented to own ability 0 Yes Electronic Signature(s) Signed: 08/03/2016 4:45:30 PM By: Justin Molina Entered By: Justin Eric on 08/03/2016 14:36:14 Justin Molina ([id number]) -------------------------------------------------------------------------------- Foot Assessment Details Patient Name: Justin Molina Date of Service: 08/03/2016 12:45 PM Medical Record Number: [id number] Patient Account Number: [id number] Date of Birth/Sex: [birth year] (56 y.o. Male) Treating Molina: Justin Mealy, Molina, Molina, Justin [employer] Primary Care Marin Wisner: SYSTEM, Molina Other Clinician: Referring Resha Filippone: Justin Castilla Treating Kashus Karlen/Extender: Justin Re in Molina: 0 Foot Assessment Items Site Locations + = Sensation present, - = Sensation absent, C = Callus, U = Ulcer R = Redness, W = Warmth, M = Maceration, PU = Pre-ulcerative lesion F = Fissure, S = Swelling, D = Dryness Assessment Right: Left: Other Deformity: No No Prior Foot Ulcer: No No Prior Amputation: No No Charcot Joint: No No Ambulatory Status: Ambulatory With Help Assistance Device: Walker Gait: Surveyor, mining) Signed: 08/03/2016 4:45:30 PM By: Justin Eric  BSN, Molina Entered By: Justin Eric on 08/03/2016 14:36:26 Justin Molina ([id number]) -------------------------------------------------------------------------------- Nutrition Risk Assessment Details Patient Name: Justin Molina Date of Service: 08/03/2016 12:45 PM Medical Record Number: [id number] Patient Account Number: [id number] Date of Birth/Sex: 1960/08/17 (56 y.o. Male) Treating Molina: Justin Mealy, Molina, Molina, Molina Primary Care Jermal Dismuke: SYSTEM, Molina Other Clinician: Referring Tiffani Kadow: Justin Castilla Treating Nihaal Friesen/Extender: Justin Re in Molina: 0 Height (in): Weight (lbs): 211 Body Mass Index (BMI): Nutrition Risk Assessment Items NUTRITION RISK SCREEN: I have an illness or condition that made me change the kind and/or 0 No amount of food I eat I eat fewer than two meals per day 0 No I eat few fruits and vegetables, or milk products 0 No I have three or more drinks of beer, liquor or wine almost every day 0 No I have tooth or mouth problems that make it hard for me to eat 0 No I don't always have enough money to buy the food I need 0 No I eat alone most of the time 0 No I take three or more different prescribed or over-the-counter drugs a 0 No day Without wanting to, I have lost or gained 10 pounds in the last six 0 No months I am not always physically able to shop, cook and/or feed myself 0 No Nutrition Protocols Good Risk Protocol 0 No interventions needed Moderate Risk Protocol Electronic Signature(s) Signed: 08/03/2016 4:45:30 PM By: Justin Molina Entered By: Justin Eric on 08/03/2016 14:36:21

## 2016-08-05 NOTE — Progress Notes (Signed)
Justin Molina, Justin Molina ([id number]) Visit Report for 08/03/2016 Chief Complaint Document Details Patient Name: Justin Molina, Justin Molina Date of Service: 08/03/2016 12:45 PM Medical Record Number: [id number] Patient Account Number: [id number] Date of Birth/Sex: 12-30-1960 (56 y.o. Male) Treating RN: Clover Mealy, RN, BSN, [place] Sink Primary Care Provider: SYSTEM, PCP Other Clinician: Referring Provider: Bary Castilla Treating Provider/Extender: Rudene Re in Treatment: 0 Information Obtained from: Patient Chief Complaint Patients presents for treatment of an open diabetic ulcer trauma to the right lateral leg which is had for about 4 weeks Electronic Signature(s) Signed: 08/03/2016 3:33:35 PM By: Evlyn Kanner MD, FACS Entered By: Evlyn Kanner on 08/03/2016 15:33:35 Justin Molina ([id number]) -------------------------------------------------------------------------------- Debridement Details Patient Name: Justin Molina Date of Service: 08/03/2016 12:45 PM Medical Record Number: [id number] Patient Account Number: [id number] Date of Birth/Sex: 1960-07-31 (56 y.o. Male) Treating RN: Clover Mealy, RN, BSN, [employer] Primary Care Provider: SYSTEM, PCP Other Clinician: Referring Provider: Bary Castilla Treating Provider/Extender: Rudene Re in Treatment: 0 Debridement Performed for Wound #1 Right Lower Leg Assessment: Performed By: Physician Evlyn Kanner, MD Debridement: Debridement Severity of Tissue Pre Fat layer exposed Debridement: Pre-procedure Verification/Time Out Yes - 14:51 Taken: Start Time: 14:51 Pain Control: Lidocaine 4% Topical Solution Level: Skin/Subcutaneous Tissue Total Area Debrided (L x 3 (cm) x 1.8 (cm) = 5.4 (cm) W): Tissue and other Non-Viable, Eschar, Exudate, Fat, Fibrin/Slough, Subcutaneous material debrided: Instrument: Curette Bleeding: Minimum Hemostasis Achieved: Pressure End Time: 14:54 Procedural Pain: 2 Post Procedural Pain: 2 Response to  Treatment: Procedure was tolerated well Post Debridement Measurements of Total Wound Length: (cm) 3 Width: (cm) 1.8 Depth: (cm) 1 Volume: (cm) 4.241 Character of Wound/Ulcer Post Requires Further Debridement Debridement: Severity of Tissue Post Debridement: Fat layer exposed Post Procedure Diagnosis Same as Pre-procedure Electronic Signature(s) Signed: 08/03/2016 3:33:15 PM By: Evlyn Kanner MD, FACS Signed: 08/03/2016 4:45:30 PM By: Elpidio Eric BSN, RN Justin Molina, Justin Molina ([id number]) Previous Signature: 08/03/2016 3:32:23 PM Version By: Evlyn Kanner MD, FACS Entered By: Evlyn Kanner on 08/03/2016 15:33:15 Justin Molina ([id number]) -------------------------------------------------------------------------------- HPI Details Patient Name: Justin Molina Date of Service: 08/03/2016 12:45 PM Medical Record Number: [id number] Patient Account Number: [id number] Date of Birth/Sex: [birth year] (56 y.o. Male) Treating RN: Clover Mealy, RN, BSN, [place] Primary Care Provider: SYSTEM, PCP Other Clinician: Referring Provider: Bary Castilla Treating Provider/Extender: Rudene Re in Treatment: 0 History of Present Illness HPI Description: 56 year old male who was seen a couple of weeks ago in the ER, with a history of leg pain and injury which he's had for about 4-6 weeks. He has a past medical history of diabetes mellitus, hypertension, stroke with right-sided deficits, chronic renal insufficiency. in the past he has had brain surgery, gastrostomy tube placement, hernia repair and a tracheostomy. he has never been a smoker. They may have been trauma due to hitting the leg against a sharp object and an initial hematoma then broke into her chronic wound. while in the ER a DVT study was done to rule out problems with thrombus in his right lower extremity. X-ray of the leg was also negative for any subcutaneous gas. was placed on Keflex and 500 mg 3 times a day and referred to the wound  center. recent blood work done in the ER showed that his blood glucose was 276 and other investigations showed a normal CBC, altered kidney functions and a review of his electronic medical records revealed that his last hemoglobin A1c done 2 years ago was 9.9% Electronic Signature(s) Signed: 08/03/2016 3:42:15 PM By: Evlyn Kanner MD, FACS Entered By: Evlyn Kanner on  08/03/2016 15:42:15 Justin Molina, Justin Molina ([id number]) -------------------------------------------------------------------------------- Physical Exam Details Patient Name: Justin Molina, Justin Molina Date of Service: 08/03/2016 12:45 PM Medical Record Number: [id number] Patient Account Number: [id number] Date of Birth/Sex: [birth year] (56 y.o. Male) Treating RN: Clover Mealy, RN, BSN, [place] Sink Primary Care Provider: SYSTEM, PCP Other Clinician: Referring Provider: Bary Castilla Treating Provider/Extender: Rudene Re in Treatment: 0 Constitutional . Pulse regular. Respirations normal and unlabored. Afebrile. . Eyes Nonicteric. Reactive to light. Ears, Nose, Mouth, and Throat Lips, teeth, and gums WNL.Marland Kitchen Moist mucosa without lesions. Neck supple and nontender. No palpable supraclavicular or cervical adenopathy. Normal sized without goiter. Respiratory WNL. No retractions.. Cardiovascular Pedal Pulses WNL. ABIs were noncompressible. No clubbing, cyanosis but he does have stage I lymphedema bilaterally.. Chest Breasts symmetical and no nipple discharge.. Breast tissue WNL, no masses, lumps, or tenderness.. Gastrointestinal (GI) Abdomen without masses or tenderness.. No liver or spleen enlargement or tenderness.. Lymphatic No adneopathy. No adenopathy. No adenopathy. Musculoskeletal Adexa without tenderness or enlargement.. Digits and nails w/o clubbing, cyanosis, infection, petechiae, ischemia, or inflammatory conditions.. Integumentary (Hair, Skin) No suspicious lesions. No crepitus or fluctuance. No peri-wound warmth or erythema.  No masses.Marland Kitchen Psychiatric Judgement and insight Intact.. No evidence of depression, anxiety, or agitation.. Notes the patient has stage I lymphedema bilaterally and on the right lateral calf he has a fairly deep wound possibly traumatic in nature which has now been evacuated of the hematoma and has a lot of slough at the base with evidence of necrotic debris and I have sharply remove this with a #3 curet and bleeding controlled with pressure. Electronic Signature(s) Signed: 08/03/2016 3:43:16 PM By: Evlyn Kanner MD, FACS [place], Justin Molina ([id number]) Entered By: Evlyn Kanner on 08/03/2016 15:43:14 Justin Molina ([id number]) -------------------------------------------------------------------------------- Physician Orders Details Patient Name: Justin Molina Date of Service: 08/03/2016 12:45 PM Medical Record Number: [id number] Patient Account Number: [id number] Date of Birth/Sex: 10-27-1960 (56 y.o. Male) Treating RN: Clover Mealy, RN, BSN, [place] Sink Primary Care Provider: SYSTEM, PCP Other Clinician: Referring Provider: Bary Castilla Treating Provider/Extender: Rudene Re in Treatment: 0 Verbal / Phone Orders: No Diagnosis Coding Wound Cleansing Wound #1 Right Lower Leg o Cleanse wound with mild soap and water o May Shower, gently pat wound dry prior to applying new dressing. o May shower with protection. Anesthetic Wound #1 Right Lower Leg o Topical Lidocaine 4% cream applied to wound bed prior to debridement - In Clinic Primary Wound Dressing Wound #1 Right Lower Leg o Santyl Ointment Secondary Dressing Wound #1 Right Lower Leg o Gauze and Kerlix/Conform Dressing Change Frequency Wound #1 Right Lower Leg o Change dressing every day. Follow-up Appointments Wound #1 Right Lower Leg o Return Appointment in 1 week. Edema Control Wound #1 Right Lower Leg o Elevate legs to the level of the heart and pump ankles as often as possible Additional Orders /  Instructions Wound #1 Right Lower Leg o Activity as tolerated o Other: - Vit A, C, Zinc Heasley, Nori ([id number]) Services and Therapies o Arterial Studies- Bilateral o Venous Studies -Bilateral Electronic Signature(s) Signed: 08/03/2016 4:14:17 PM By: Evlyn Kanner MD, FACS Signed: 08/03/2016 4:45:30 PM By: Elpidio Eric BSN, RN Entered By: Elpidio Eric on 08/03/2016 15:31:14 Justin Molina ([id number]) -------------------------------------------------------------------------------- Problem List Details Patient Name: Justin Molina Date of Service: 08/03/2016 12:45 PM Medical Record Number: [id number] Patient Account Number: [id number] Date of Birth/Sex: 07-10-1960 (56 y.o. 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 08/03/2016 Yes diseases I73.9 Peripheral vascular disease, unspecified 08/03/2016 Yes Inactive Problems Resolved Problems Electronic Signature(s) Signed: 08/03/2016 3:45:57 PM By: Evlyn KannerBritto, Kainoah Bartosiewicz MD, FACS Previous Signature: 08/03/2016 3:32:11 PM Version By: Evlyn KannerBritto, Yakub Lodes MD, FACS Entered By: Evlyn KannerBritto, Meggen Spaziani on 08/03/2016 15:45:56 Justin EeODGERS, Justin Molina ([id number]) -------------------------------------------------------------------------------- Progress Note Details Patient Name: Justin EeODGERS, Justin Molina Date of Service: 08/03/2016 12:45 PM Medical Record Number: [id number] Patient Account  Number: [id number] Date of Birth/Sex: [birth year] [age] y.o. Male) Treating RN: Clover MealyAfful, RN, BSN, [place] Primary Care Provider: SYSTEM, PCP Other Clinician: Referring Provider: Bary CastillaMACKUEN, Justin Molina Treating Provider/Extender: Rudene ReBritto, Jerlisa Diliberto Weeks in Treatment: 0 Subjective Chief Complaint Information obtained from Patient Patients presents for treatment of an open diabetic ulcer trauma to the right lateral leg which is had for about 4 weeks History of Present Illness (HPI) 56 year old male who was seen a couple of weeks ago in the ER, with a history of leg pain and injury which he's had for about 4-6 weeks. He has a past medical history of diabetes mellitus, hypertension, stroke with right-sided deficits, chronic renal insufficiency. in the past he has had brain surgery, gastrostomy tube placement, hernia repair and a tracheostomy. he has never been a smoker. They may have been trauma due to hitting the leg against a sharp object and an initial hematoma then broke into her chronic wound. while in the ER a DVT study was done to rule out problems with thrombus in his right lower extremity. X-ray of the leg was also negative for any subcutaneous gas. was placed on Keflex and 500 mg 3 times a day and referred to the wound center. recent blood work done in the ER showed that his blood glucose was 276 and other investigations showed a normal CBC, altered kidney functions and a review of his electronic medical records revealed that his last hemoglobin A1c done 2 years ago was 9.9% Wound History Patient presents with 1 open wound that has been present for approximately 2 month. Patient has been treating wound in the following manner: santyl. Laboratory tests have not been performed in the last month. Patient reportedly has not tested positive for an antibiotic resistant organism. Patient reportedly has not had testing performed to evaluate circulation in the legs. Patient experiences the following problems  associated with their wounds: infection, swelling. Patient History Allergies Iodinated Contrast- Oral and IV Dye Family History Diabetes - Siblings, Paternal Grandparents, Maternal Grandparents, Hypertension - Father, Mother, Maternal Grandparents, Stroke - Siblings, No family history of Cancer, Heart Disease, Hereditary Spherocytosis, Kidney Disease, Lung Disease, Seizures, Thyroid Problems, Tuberculosis. Social History Justin EeRODGERS, Winferd ([id number]) Never smoker, Marital Status - Married, Alcohol Use - Never, Drug Use - No History, Caffeine Use - Moderate. Medical History Eyes Denies history of Cataracts, Glaucoma, Optic Neuritis Ear/Nose/Mouth/Throat Denies history of Chronic sinus problems/congestion, Middle ear problems Hematologic/Lymphatic Patient has history of Anemia Denies history of Hemophilia, Human Immunodeficiency Virus, Lymphedema, Sickle Cell Disease Respiratory Denies history of Aspiration, Asthma, Chronic Obstructive Pulmonary Disease (COPD), Pneumothorax, Sleep Apnea, Tuberculosis Cardiovascular Patient has history of Arrhythmia, Hypertension, Peripheral Venous Disease Gastrointestinal Denies history of Cirrhosis , Colitis, Crohn s, Hepatitis A, Hepatitis B, Hepatitis C Endocrine Patient has history of Type II Diabetes  Genitourinary Denies history of End Stage Renal Disease Immunological Denies history of Lupus Erythematosus, Raynaud s, Scleroderma Integumentary (Skin) Denies history of History of Burn, History of pressure wounds Musculoskeletal Denies history of Gout, Rheumatoid Arthritis, Osteoarthritis, Osteomyelitis Neurologic Denies history of Dementia, Neuropathy, Quadriplegia, Paraplegia, Seizure Disorder Patient is treated with Insulin, Oral Agents. Blood sugar is tested. Blood sugar results noted at the following times: Breakfast - 200. Medical And Surgical History Notes Cardiovascular Stroke 2009 with right sided residual Review of Systems  (ROS) Constitutional Symptoms (General Health) The patient has no complaints or symptoms. Eyes Complains or has symptoms of Glasses / Contacts. Ear/Nose/Mouth/Throat The patient has no complaints or symptoms. Hematologic/Lymphatic The patient has no complaints or symptoms. Respiratory The patient has no complaints or symptoms. Cardiovascular Complains or has symptoms of LE edema. SEAB, Justin Molina ([id number]) Gastrointestinal The patient has no complaints or symptoms. Endocrine The patient has no complaints or symptoms. Genitourinary The patient has no complaints or symptoms. Immunological The patient has no complaints or symptoms. Integumentary (Skin) Complains or has symptoms of Wounds, Breakdown, Swelling. Musculoskeletal The patient has no complaints or symptoms. Neurologic The patient has no complaints or symptoms. Medications carvedilol 25 mg tablet oral 2 2 tablet oral twice daily with meal acetaminophen 500 mg capsule oral 1 capsule oral every 6 hours tramadol 50 mg tablet oral 1 1 tablet oral every 5 hours as needed for pain buspirone 10 mg tablet oral 1 1 tablet oral twice daily glimepiride 4 mg tablet oral 1 1 tablet oral twice daily atorvastatin 20 mg tablet oral 1 1 tablet oral at bedtime clonidine 0.2 mg/24 hr weekly transdermal patch transdermal 1 1 patch weekly transdermal hydralazine 50 mg tablet oral 1 1 tablet oral three times daily amlodipine 10 mg tablet oral 1 1 tablet oral once daily cephalexin 500 mg capsule oral 1 1 capsule oral three times daily fluticasone 50 mcg/actuation blister powder for inhalation inhalation blister with device inhalation daily as needed Levemir FlexTouch U-100 Insulin 100 unit/mL (3 mL) subcutaneous pen subcutaneous 48 48 units insulin pen subcutaneous at 10 pm Novolog Flexpen U-100 Insulin aspart 100 unit/mL subcutaneous subcutaneous 28-32 units insulin pen subcutaneous three times daily with meals furosemide 20 mg tablet  oral 1 1 tablet oral twice daily Objective Constitutional Pulse regular. Respirations normal and unlabored. Afebrile. Vitals Time Taken: 2:18 PM, Weight: 211 lbs, Source: Stated, Temperature: 98.1 F, Pulse: 82 bpm, Sidman, Amad ([id number]) Respiratory Rate: 18 breaths/min, Blood Pressure: 151/92 mmHg. Eyes Nonicteric. Reactive to light. Ears, Nose, Mouth, and Throat Lips, teeth, and gums WNL.Marland Kitchen Moist mucosa without lesions. Neck supple and nontender. No palpable supraclavicular or cervical adenopathy. Normal sized without goiter. Respiratory WNL. No retractions.. Cardiovascular Pedal Pulses WNL. ABIs were noncompressible. No clubbing, cyanosis but he does have stage I lymphedema bilaterally.. Chest Breasts symmetical and no nipple discharge.. Breast tissue WNL, no masses, lumps, or tenderness.. Gastrointestinal (GI) Abdomen without masses or tenderness.. No liver or spleen enlargement or tenderness.. Lymphatic No adneopathy. No adenopathy. No adenopathy. Musculoskeletal Adexa without tenderness or enlargement.. Digits and nails w/o clubbing, cyanosis, infection, petechiae, ischemia, or inflammatory conditions.Marland Kitchen Psychiatric Judgement and insight Intact.. No evidence of depression, anxiety, or agitation.. General Notes: the patient has stage I lymphedema bilaterally and on the right lateral calf he has a fairly deep wound possibly traumatic in nature which has now been evacuated of the hematoma and has a lot of slough at the base with evidence of necrotic debris and I have sharply remove this with a #  3 curet and bleeding controlled with pressure. Integumentary (Hair, Skin) No suspicious lesions. No crepitus or fluctuance. No peri-wound warmth or erythema. No masses.. Wound #1 status is Open. Original cause of wound was Gradually Appeared. The wound is located on the Right Lower Leg. The wound measures 3cm length x 1.8cm width x 1cm depth; 4.241cm^2 area and 4.241cm^3 volume.  There is Fat Layer (Subcutaneous Tissue) Exposed exposed. There is no tunneling or undermining noted. There is a large amount of serosanguineous drainage noted. The wound margin is distinct with the outline attached to the wound base. There is small (1-33%) pink granulation within the wound bed. There is a large (67-100%) amount of necrotic tissue within the wound bed including Eschar and Adherent Slough. The periwound skin appearance exhibited: Induration, Ecchymosis, Hemosiderin Eddinger, Drury ([id number]) Staining, Mottled. The periwound skin appearance did not exhibit: Callus, Crepitus, Excoriation, Rash, Scarring, Dry/Scaly, Maceration, Atrophie Blanche, Cyanosis, Pallor, Rubor, Erythema. Periwound temperature was noted as Hot. The periwound has tenderness on palpation. 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 Santyl ointment locally to be changed with the aborted form daily 2. Elevation and exercise 3. Arterial duplex and venous reflux studies 4. adequate proteins, vitamin A, vitamin C and zinc. 5. Regular visits to the wound center Procedures Wound #1 Pre-procedure diagnosis of Wound #1 is a Venous Leg Ulcer located on the Right Lower Leg .Severity of Tissue Pre Debridement is: Fat layer exposed. There was a Skin/Subcutaneous Tissue Debridement ([id number]) debridement with total area  of 5.4 sq cm performed by Evlyn Kanner, MD. with the following instrument(s): Curette to remove Non-Viable tissue/material including Exudate, Fat Layer (and Subcutaneous Tissue) Exposed, Fibrin/Slough, Eschar, and Subcutaneous after achieving pain control using Lidocaine 4% Topical Solution. A time out was conducted at 14:51, prior to the start of the procedure. A Minimum amount of bleeding was controlled with Pressure. The procedure was tolerated well with a pain level of 2 throughout and a pain level of 2 following the procedure. Post Debridement Measurements: 3cm length x 1.8cm width x 1cm depth; 4.241cm^3 volume. Character of Wound/Ulcer Post Debridement requires further debridement. Severity of Tissue Post Debridement is: Fat layer exposed. Post procedure Diagnosis Wound #1: Same as Pre-Procedure Justin Molina, Justin Molina ([id number]) Plan Wound Cleansing: Wound #1 Right Lower Leg: Cleanse wound with mild soap and water May Shower, gently pat wound dry prior to applying new dressing. May shower with protection. Anesthetic: Wound #1 Right Lower Leg: Topical Lidocaine 4% cream applied to wound bed prior to debridement - In Clinic Primary Wound Dressing: Wound #1 Right Lower Leg: Santyl Ointment Secondary Dressing: Wound #1 Right Lower Leg: Gauze and Kerlix/Conform Dressing Change Frequency: Wound #1 Right Lower Leg: Change dressing every day. Follow-up Appointments: Wound #1 Right Lower Leg: Return Appointment in 1 week. Edema Control: Wound #1 Right Lower Leg: Elevate legs to the level of the heart and pump ankles as often as possible Additional Orders / Instructions: Wound #1 Right Lower Leg: Activity as tolerated Other: - Vit A, C,  Zinc Services and Therapies ordered were: Arterial Studies- Bilateral, Venous Studies -Bilateral This 56 year old gentleman with uncontrolled diabetes mellitus and several comorbidities including right- sided weakness post CVA, has been reviewed for  a right lower extremity wound and after a thorough discussion I have recommended: 1. Santyl ointment locally to be changed with the aborted form daily 2. Elevation and exercise Justin Molina, Justin Molina ([id number]) 3. Arterial duplex and venous reflux studies 4. adequate proteins, vitamin A, vitamin C and zinc. 5. Regular visits to the wound center Electronic Signature(s) Signed: 08/03/2016 3:46:12 PM By: Evlyn Kanner MD, FACS Previous Signature: 08/03/2016 3:44:51 PM Version By: Evlyn Kanner MD, FACS Entered By: Evlyn Kanner on 08/03/2016 15:46:12 Justin Molina ([id number]) -------------------------------------------------------------------------------- ROS/PFSH Details Patient Name: Justin Molina Date of Service: 08/03/2016 12:45 PM Medical Record Number: [id number] Patient Account Number: [id number] Date of Birth/Sex: 12/30/1960 (56 y.o. Male) Treating RN: Clover Mealy, RN, BSN, [employer] Primary Care Provider: SYSTEM, PCP Other Clinician: Referring Provider: Bary Castilla Treating Provider/Extender: Rudene Re in Treatment: 0 Wound History Do you currently have one or more open woundso Yes How many open wounds do you currently haveo 1 Approximately how long have you had your woundso 2 month How have you been treating your wound(s) until nowo santyl Has your wound(s) ever healed and then re-openedo No Have you had any lab work done in the past montho No Have you tested positive for an antibiotic resistant organism (MRSA, VRE)o No Have you had any tests for circulation on your legso No Have you had other problems associated with your woundso Infection, Swelling Eyes Complaints and Symptoms: Positive for: Glasses / Contacts Medical History: Negative for: Cataracts; Glaucoma; Optic Neuritis Cardiovascular Complaints and Symptoms: Positive for: LE edema Medical History: Positive for: Arrhythmia; Hypertension; Peripheral Venous Disease Past Medical History Notes: Stroke 2009  with right sided residual Integumentary (Skin) Complaints and Symptoms: Positive for: Wounds; Breakdown; Swelling Medical History: Negative for: History of Burn; History of pressure wounds Constitutional Symptoms (General Health) Complaints and Symptoms: No Complaints or Symptoms Justin Molina, Justin Molina ([id number]) Ear/Nose/Mouth/Throat Complaints and Symptoms: No Complaints or Symptoms Medical History: Negative for: Chronic sinus problems/congestion; Middle ear problems Hematologic/Lymphatic Complaints and Symptoms: No Complaints or Symptoms Medical History: Positive for: Anemia Negative for: Hemophilia; Human Immunodeficiency Virus; Lymphedema; Sickle Cell Disease Respiratory Complaints and Symptoms: No Complaints or Symptoms Medical History: Negative for: Aspiration; Asthma; Chronic Obstructive Pulmonary Disease (COPD); Pneumothorax; Sleep Apnea; Tuberculosis Gastrointestinal Complaints and Symptoms: No Complaints or Symptoms Medical History: Negative for: Cirrhosis ; Colitis; Crohnos; Hepatitis A; Hepatitis B; Hepatitis C Endocrine Complaints and Symptoms: No Complaints or Symptoms Medical History: Positive for: Type II Diabetes Time with diabetes: 8 years Treated with: Insulin, Oral agents Blood sugar tested every day: Yes Tested : 4 times a day Blood sugar testing results: Breakfast: 200 Genitourinary Complaints and Symptoms: No Complaints or Symptoms BISHOP, VANDERWERF ([id number]) Medical History: Negative for: End Stage Renal Disease Immunological Complaints and Symptoms: No Complaints or Symptoms Medical History: Negative for: Lupus Erythematosus; Raynaudos; Scleroderma Musculoskeletal Complaints and Symptoms: No Complaints or Symptoms Medical History: Negative for: Gout; Rheumatoid Arthritis; Osteoarthritis; Osteomyelitis Neurologic Complaints and Symptoms: No Complaints or Symptoms Medical History: Negative for: Dementia; Neuropathy; Quadriplegia;  Paraplegia; Seizure Disorder Immunizations Pneumococcal Vaccine: Received Pneumococcal Vaccination: No Family and Social History Cancer: No; Diabetes: Yes - Siblings, Paternal Grandparents, Maternal Grandparents; Heart Disease: No; Hereditary Spherocytosis: No; Hypertension: Yes - Father, Mother, Maternal Grandparents; Kidney Disease: No; Lung Disease: No; Seizures: No; Stroke: Yes - Siblings; Thyroid Problems: No; Tuberculosis: No;  Never smoker; Marital Status - Married; Alcohol Use: Never; Drug Use: No History; Caffeine Use: Moderate; Financial Concerns: No; Food, Clothing or Shelter Needs: No; Support System Lacking: No; Transportation Concerns: No Physician Affirmation I have reviewed and agree with the above information. Electronic Signature(s) Signed: 08/03/2016 4:14:17 PM By: Evlyn Kanner MD, FACS Signed: 08/03/2016 4:45:30 PM By: Elpidio Eric BSN, RN Entered By: Evlyn Kanner on 08/03/2016 14:55:18 TIONNE, DAYHOFF ([id number]) -------------------------------------------------------------------------------- SuperBill Details Patient Name: Justin Molina Date of Service: 08/03/2016 Medical Record Number: [id number] Patient Account Number: [id number] Date of Birth/Sex: 12/17/1960 (56 y.o. 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 I73.9 Peripheral vascular disease, unspecified Facility Procedures CPT4 Code Description: 78295621 99213 - WOUND CARE VISIT-LEV 3 EST PT Modifier: Quantity: 1 CPT4 Code Description: 30865784 11042 - DEB SUBQ  TISSUE 20 SQ CM/< ICD-10 Description Diagnosis E11.622 Type 2 diabetes mellitus with other skin ulcer I89.0 Lymphedema, not elsewhere classified S81.811A Laceration without foreign body, right lower leg,  init I73.9 Peripheral vascular disease, unspecified Modifier: ial encounte Quantity: 1 r Physician Procedures CPT4 Code Description: 6962952 11042 - WC PHYS SUBQ TISS 20 SQ CM ICD-10 Description Diagnosis E11.622 Type 2 diabetes mellitus with other skin ulcer I89.0 Lymphedema, not elsewhere classified S81.811A Laceration without foreign body, right lower leg,  init I73.9 Peripheral vascular disease, unspecified Modifier: ial encounte Quantity: 1 r Electronic Signature(s) Signed: 08/03/2016 3:45:21 PM By: Evlyn Kanner MD, FACS Entered By: Evlyn Kanner on 08/03/2016 15:45:21 Justin Molina ([id number])

## 2016-08-10 ENCOUNTER — Ambulatory Visit: Payer: Medicare Other | Admitting: Physician Assistant

## 2016-08-15 ENCOUNTER — Encounter: Payer: Medicare (Managed Care) | Attending: Physician Assistant | Admitting: Physician Assistant

## 2016-08-15 DIAGNOSIS — S81811A Laceration without foreign body, right lower leg, initial encounter: Secondary | ICD-10-CM | POA: Insufficient documentation

## 2016-08-15 DIAGNOSIS — G468 Other vascular syndromes of brain in cerebrovascular diseases: Secondary | ICD-10-CM | POA: Insufficient documentation

## 2016-08-15 DIAGNOSIS — E11622 Type 2 diabetes mellitus with other skin ulcer: Secondary | ICD-10-CM | POA: Diagnosis present

## 2016-08-15 DIAGNOSIS — I890 Lymphedema, not elsewhere classified: Secondary | ICD-10-CM | POA: Diagnosis not present

## 2016-08-15 DIAGNOSIS — L97212 Non-pressure chronic ulcer of right calf with fat layer exposed: Secondary | ICD-10-CM | POA: Diagnosis not present

## 2016-08-15 DIAGNOSIS — I10 Essential (primary) hypertension: Secondary | ICD-10-CM | POA: Insufficient documentation

## 2016-08-15 DIAGNOSIS — Z8673 Personal history of transient ischemic attack (TIA), and cerebral infarction without residual deficits: Secondary | ICD-10-CM | POA: Insufficient documentation

## 2016-08-15 DIAGNOSIS — I739 Peripheral vascular disease, unspecified: Secondary | ICD-10-CM | POA: Insufficient documentation

## 2016-08-15 DIAGNOSIS — X58XXXA Exposure to other specified factors, initial encounter: Secondary | ICD-10-CM | POA: Insufficient documentation

## 2016-08-17 NOTE — Progress Notes (Addendum)
COWAN, PILAR ([id number]) Visit Report for 08/15/2016 Arrival Information Details Patient Name: Justin Molina, Justin Molina Date of Service: 08/15/2016 1:30 PM Medical Record Number: [id number] Patient Account Number: [id number] Date of Birth/Sex: 05-26-1960 (56 y.o. Male) Treating RN: Phillis Haggis Primary Care Jamyah Folk: SYSTEM, PCP Other Clinician: Referring Handy Mcloud: Bary Castilla Treating Drury Ardizzone/Extender: STONE III, HOYT Weeks in Treatment: 1 Visit Information History Since Last Visit All ordered tests and consults were completed: No Patient Arrived: [name] Added or deleted any medications: No Arrival Time: 13:42 Any new allergies or adverse reactions: No Accompanied By: wife Had a fall or experienced change in No Transfer Assistance: EasyPivot Patient activities of daily living that may affect Lift risk of falls: Patient Identification Verified: Yes Signs or symptoms of abuse/neglect since last No Secondary Verification Process Yes visito Completed: Hospitalized since last visit: No Patient Requires Transmission- No Has Dressing in Place as Prescribed: Yes Based Precautions: Pain Present Now: No Patient Has Alerts: Yes Patient Alerts: Patient on Blood Thinner Electronic Signature(s) Signed: 08/16/2016 7:50:03 AM By: Alejandro Mulling Entered By: Alejandro Mulling on 08/15/2016 13:44:20 Justin Molina ([id number]) -------------------------------------------------------------------------------- Clinic Level of Care Assessment Details Patient Name: Justin Molina Date of Service: 08/15/2016 1:30 PM Medical Record Number: [id number] Patient Account Number: [id number] Date of Birth/Sex: [birth year] (56 y.o. Male) Treating RN: Ashok Cordia, Debi Primary Care Jullisa Grigoryan: SYSTEM, PCP Other Clinician: Referring Kerrin Markman: Bary Castilla Treating Maxtyn Nuzum/Extender: STONE III, HOYT Weeks in Treatment: 1 Clinic Level of Care Assessment Items TOOL 4 Quantity Score X - Use when  only an EandM is performed on FOLLOW-UP visit 1 0 ASSESSMENTS - Nursing Assessment / Reassessment X - Reassessment of Co-morbidities (includes updates in patient status) 1 10 X - Reassessment of Adherence to Treatment Plan 1 5 ASSESSMENTS - Wound and Skin Assessment / Reassessment X - Simple Wound Assessment / Reassessment - one wound 1 5 []  - Complex Wound Assessment / Reassessment - multiple wounds 0 []  - Dermatologic / Skin Assessment (not related to wound area) 0 ASSESSMENTS - Focused Assessment []  - Circumferential Edema Measurements - multi extremities 0 []  - Nutritional Assessment / Counseling / Intervention 0 []  - Lower Extremity Assessment (monofilament, tuning fork, pulses) 0 []  - Peripheral Arterial Disease Assessment (using hand held doppler) 0 ASSESSMENTS - Ostomy and/or Continence Assessment and Care []  - Incontinence Assessment and Management 0 []  - Ostomy Care Assessment and Management (repouching, etc.) 0 PROCESS - Coordination of Care []  - Simple Patient / Family Education for ongoing care 0 X - Complex (extensive) Patient / Family Education for ongoing care 1 20 X - Staff obtains Chiropractor, Records, Test Results / Process Orders 1 10 []  - Staff telephones HHA, Nursing Homes / Clarify orders / etc 0 []  - Routine Transfer to another Facility (non-emergent condition) 0 BEAR, OSTEN ([id number]) []  - Routine Hospital Admission (non-emergent condition) 0 []  - New Admissions / Manufacturing engineer / Ordering NPWT, Apligraf, etc. 0 []  - Emergency Hospital Admission (emergent condition) 0 X - Simple Discharge Coordination 1 10 []  - Complex (extensive) Discharge Coordination 0 PROCESS - Special Needs []  - Pediatric / Minor Patient Management 0 []  - Isolation Patient Management 0 []  - Hearing / Language / Visual special needs 0 []  - Assessment of Community assistance (transportation, D/C planning, etc.) 0 []  - Additional assistance / Altered mentation 0 []  - Support  Surface(s) Assessment (bed, cushion, seat, etc.) 0 INTERVENTIONS - Wound Cleansing / Measurement X - Simple Wound Cleansing - one wound 1 5 []  - Complex Wound Cleansing - multiple wounds  0 X - Wound Imaging (photographs - any number of wounds) 1 5 []  - Wound Tracing (instead of photographs) 0 X - Simple Wound Measurement - one wound 1 5 []  - Complex Wound Measurement - multiple wounds 0 INTERVENTIONS - Wound Dressings []  - Small Wound Dressing one or multiple wounds 0 X - Medium Wound Dressing one or multiple wounds 1 15 []  - Large Wound Dressing one or multiple wounds 0 X - Application of Medications - topical 1 5 []  - Application of Medications - injection 0 INTERVENTIONS - Miscellaneous []  - External ear exam 0 Surprenant, Deepak ([id number]) []  - Specimen Collection (cultures, biopsies, blood, body fluids, etc.) 0 []  - Specimen(s) / Culture(s) sent or taken to Lab for analysis 0 []  - Patient Transfer (multiple staff / Michiel SitesHoyer Lift / Similar devices) 0 []  - Simple Staple / Suture removal (25 or less) 0 []  - Complex Staple / Suture removal (26 or more) 0 []  - Hypo / Hyperglycemic Management (close monitor of Blood Glucose) 0 []  - Ankle / Brachial Index (ABI) - do not check if billed separately 0 X - Vital Signs 1 5 Has the patient been seen at the hospital within the last three years: Yes Total Score: 100 Level Of Care: New/Established - Level 3 Electronic Signature(s) Signed: 08/16/2016 7:50:03 AM By: Alejandro MullingPinkerton, Debra Entered By: Alejandro MullingPinkerton, Debra on 08/15/2016 14:42:16 Justin Molina, Justin Molina ([id number]) -------------------------------------------------------------------------------- Encounter Discharge Information Details Patient Name: Justin Molina, Justin Molina Date of Service: 08/15/2016 1:30 PM Medical Record Number: [id number] Patient Account Number: [id number] Date of Birth/Sex: [birth year](55 y.o. Male) Treating RN: Phillis HaggisPinkerton, Debi Primary Care Naheim Burgen: SYSTEM, PCP Other  Clinician: Referring Anyah Swallow: Bary CastillaMACKUEN, COURTENEY Treating Arlin Savona/Extender: STONE III, HOYT Weeks in Treatment: 1 Encounter Discharge Information Items Discharge Pain Level: 0 Discharge Condition: Stable Ambulatory Status: Cane Discharge Destination: Home Transportation: Private Auto Accompanied By: wife Schedule Follow-up Appointment: Yes Medication Reconciliation completed No and provided to Patient/Care Kennidee Heyne: Provided on Clinical Summary of Care: 08/15/2016 Form Type Recipient Paper Patient RR Electronic Signature(s) Signed: 08/15/2016 2:26:47 PM By: Gwenlyn PerkingMoore, Shelia Entered By: Gwenlyn PerkingMoore, Shelia on 08/15/2016 14:26:47 Justin Molina, Justin Molina ([id number]) -------------------------------------------------------------------------------- Lower Extremity Assessment Details Patient Name: Justin Molina, Justin Molina Date of Service: 08/15/2016 1:30 PM Medical Record Number: [id number] Patient Account Number: [id number] Date of Birth/Sex: [birth year](55 y.o. Male) Treating RN: Phillis HaggisPinkerton, Debi Primary Care Bryor Rami: SYSTEM, PCP Other Clinician: Referring Toneisha Savary: Bary CastillaMACKUEN, COURTENEY Treating Asiya Cutbirth/Extender: STONE III, HOYT Weeks in Treatment: 1 Vascular Assessment Pulses: Dorsalis Pedis Palpable: [Right:Yes] Posterior Tibial Extremity colors, hair growth, and conditions: Extremity Color: [Right:Hyperpigmented] Temperature of Extremity: [Right:Warm] Capillary Refill: [Right:< 3 seconds] Toe Nail Assessment Left: Right: Thick: Yes Discolored: Yes Deformed: Yes Improper Length and Hygiene: Yes Electronic Signature(s) Signed: 08/16/2016 7:50:03 AM By: Alejandro MullingPinkerton, Debra Entered By: Alejandro MullingPinkerton, Debra on 08/15/2016 13:55:05 Justin Molina, Justin Molina ([id number]) -------------------------------------------------------------------------------- Multi Wound Chart Details Patient Name: Justin Molina, Justin Molina Date of Service: 08/15/2016 1:30 PM Medical Record Number: [id number] Patient Account Number:  [id number] Date of Birth/Sex: [birth year] [age] y.o. Male) Treating RN: Ashok CordiaPinkerton, Debi Primary Care Taj Nevins: SYSTEM, PCP Other Clinician: Referring Charna Neeb: Bary CastillaMACKUEN, COURTENEY Treating Jazelyn Sipe/Extender: STONE III, HOYT Weeks in Treatment: 1 Vital Signs Height(in): Pulse(bpm): 79 Weight(lbs): 211 Blood Pressure 177/94 (mmHg): Body Mass Index(BMI): Temperature(F): 97.8 Respiratory Rate 18 (breaths/min): Photos: [1:No Photos] [N/A:N/A] Wound Location: [1:Right Lower Leg] [N/A:N/A] Wounding Event: [1:Gradually Appeared] [N/A:N/A] Primary Etiology: [1:Trauma, Other] [N/A:N/A] Comorbid History: [1:Anemia, Arrhythmia, Hypertension, Peripheral Venous Disease, Type II Diabetes] [N/A:N/A] Date Acquired: [1:06/12/2016] [N/A:N/A] Weeks of Treatment: [1:1] [N/A:N/A] Wound Status: [1:Open] [N/A:N/A] Measurements L  x W x D 2.4x1.7x0.7 [N/A:N/A] (cm) Area (cm) : [1:3.204] [N/A:N/A] Volume (cm) : [1:2.243] [N/A:N/A] % Reduction in Area: [1:24.50%] [N/A:N/A] % Reduction in Volume: 47.10% [N/A:N/A] Classification: [1:Full Thickness Without Exposed Support Structures] [N/A:N/A] HBO Classification: [1:Grade 1] [N/A:N/A] Exudate Amount: [1:Large] [N/A:N/A] Exudate Type: [1:Serosanguineous] [N/A:N/A] Exudate Color: [1:red, brown] [N/A:N/A] Wound Margin: [1:Distinct, outline attached] [N/A:N/A] Granulation Amount: [1:Small (1-33%)] [N/A:N/A] Granulation Quality: [1:Pink] [N/A:N/A] Necrotic Amount: [1:Medium (34-66%)] [N/A:N/A] Necrotic Tissue: [1:Eschar, Adherent Slough] [N/A:N/A] Exposed Structures: [N/A:N/A] Fat Layer (Subcutaneous Tissue) Exposed: Yes Fascia: No Tendon: No Muscle: No Joint: No Bone: No Epithelialization: None N/A N/A Periwound Skin Texture: Induration: Yes N/A N/A Excoriation: No Callus: No Crepitus: No Rash: No Scarring: No Periwound Skin Maceration: No N/A N/A Moisture: Dry/Scaly: No Periwound Skin Color: Ecchymosis: Yes N/A N/A Hemosiderin Staining:  Yes Mottled: Yes Atrophie Blanche: No Cyanosis: No Erythema: No Pallor: No Rubor: No Temperature: No Abnormality N/A N/A Tenderness on Yes N/A N/A Palpation: Wound Preparation: Ulcer Cleansing: N/A N/A Rinsed/Irrigated with Saline Topical Anesthetic Applied: Other: lidocaine 4% Treatment Notes Electronic Signature(s) Signed: 08/16/2016 7:50:03 AM By: Alejandro Mulling Entered By: Alejandro Mulling on 08/15/2016 13:55:19 Justin Molina ([id number]) -------------------------------------------------------------------------------- Multi-Disciplinary Care Plan Details Patient Name: Justin Molina Date of Service: 08/15/2016 1:30 PM Medical Record Number: [id number] Patient Account Number: [id number] Date of Birth/Sex: 05-25-1960 (56 y.o. Male) Treating RN: Phillis Haggis Primary Care Bionca Mckey: SYSTEM, PCP Other Clinician: Referring Julliette Frentz: Bary Castilla Treating Kerrie Timm/Extender: Linwood Dibbles, HOYT Weeks in Treatment: 1 Active Inactive Electronic Signature(s) Signed: 09/04/2016 7:59:22 AM By: Elliot Gurney, BSN, RN, CWS, Kim RN, BSN Signed: 10/22/2016 3:38:52 PM By: Alejandro Mulling Previous Signature: 08/16/2016 7:50:03 AM Version By: Alejandro Mulling Entered By: Elliot Gurney BSN, RN, CWS, Kim on 08/28/2016 10:04:55 Justin Molina ([id number]) -------------------------------------------------------------------------------- Pain Assessment Details Patient Name: Justin Molina Date of Service: 08/15/2016 1:30 PM Medical Record Number: [id number] Patient Account Number: [id number] Date of Birth/Sex: 13-Jun-1960 (56 y.o. Male) Treating RN: Phillis Haggis Primary Care Khrystyne Arpin: SYSTEM, PCP Other Clinician: Referring Tamyia Minich: Bary Castilla Treating Sullivan Jacuinde/Extender: STONE III, HOYT Weeks in Treatment: 1 Active Problems Location of Pain Severity and Description of Pain Patient Has Paino No Site Locations With Dressing Change: No Pain Management and Medication Current Pain  Management: Electronic Signature(s) Signed: 08/16/2016 7:50:03 AM By: Alejandro Mulling Entered By: Alejandro Mulling on 08/15/2016 13:44:34 Justin Molina ([id number]) -------------------------------------------------------------------------------- Patient/Caregiver Education Details Patient Name: Justin Molina Date of Service: 08/15/2016 1:30 PM Medical Record Number: [id number] Patient Account Number: [id number] Date of Birth/Gender: 1960/09/06 (56 y.o. Male) Treating RN: Phillis Haggis Primary Care Physician: SYSTEM, PCP Other Clinician: Referring Physician: Bary Castilla Treating Physician/Extender: Linwood Dibbles, HOYT Weeks in Treatment: 1 Education Assessment Education Provided To: Patient Education Topics Provided Wound/Skin Impairment: Handouts: Other: change dressing as ordered Methods: Demonstration, Explain/Verbal Responses: State content correctly Electronic Signature(s) Signed: 08/16/2016 7:50:03 AM By: Alejandro Mulling Entered By: Alejandro Mulling on 08/15/2016 14:03:22 Justin Molina ([id number]) -------------------------------------------------------------------------------- Wound Assessment Details Patient Name: Justin Molina Date of Service: 08/15/2016 1:30 PM Medical Record Number: [id number] Patient Account Number: [id number] Date of Birth/Sex: 02-03-1961 (56 y.o. Male) Treating RN: Phillis Haggis Primary Care Wylie Coon: SYSTEM, PCP Other Clinician: Referring Jaidence Geisler: Bary Castilla Treating Sandy Haye/Extender: STONE III, HOYT Weeks in Treatment: 1 Wound Status Wound Number: 1 Primary Trauma, Other Etiology: Wound Location: Right Lower Leg Wound Open Wounding Event: Gradually Appeared Status: Date Acquired: 06/12/2016 Comorbid Anemia, Arrhythmia, Hypertension, Weeks Of Treatment: 1 History: Peripheral Venous Disease, Type II Clustered Wound: No Diabetes Photos Photo Uploaded By: Alejandro Mulling on 08/15/2016 14:38:22  Wound  Measurements Length: (cm) 2.4 Width: (cm) 1.7 Depth: (cm) 0.7 Area: (cm) 3.204 Volume: (cm) 2.243 % Reduction in Area: 24.5% % Reduction in Volume: 47.1% Epithelialization: None Tunneling: No Undermining: No Wound Description Full Thickness Without Exposed Foul Odor Af Classification: Support Structures Slough/Fibri Diabetic Severity Grade 1 (Wagner): Wound Margin: Distinct, outline attached Exudate Amount: Large Exudate Type: Serosanguineous Exudate Color: red, brown ter Cleansing: No no Yes Wound Bed Granulation Amount: Small (1-33%) Exposed Structure RAMI, BUDHU ([id number]) Granulation Quality: Pink Fascia Exposed: No Necrotic Amount: Medium (34-66%) Fat Layer (Subcutaneous Tissue) Exposed: Yes Necrotic Quality: Eschar, Adherent Slough Tendon Exposed: No Muscle Exposed: No Joint Exposed: No Bone Exposed: No Periwound Skin Texture Texture Color No Abnormalities Noted: No No Abnormalities Noted: No Callus: No Atrophie Blanche: No Crepitus: No Cyanosis: No Excoriation: No Ecchymosis: Yes Induration: Yes Erythema: No Rash: No Hemosiderin Staining: Yes Scarring: No Mottled: Yes Pallor: No Moisture Rubor: No No Abnormalities Noted: No Dry / Scaly: No Temperature / Pain Maceration: No Temperature: No Abnormality Tenderness on Palpation: Yes Wound Preparation Ulcer Cleansing: Rinsed/Irrigated with Saline Topical Anesthetic Applied: Other: lidocaine 4%, Electronic Signature(s) Signed: 08/16/2016 7:50:03 AM By: Alejandro Mulling Entered By: Alejandro Mulling on 08/15/2016 13:54:28 Justin Molina ([id number]) -------------------------------------------------------------------------------- Vitals Details Patient Name: Justin Molina Date of Service: 08/15/2016 1:30 PM Medical Record Number: [id number] Patient Account Number: [id number] Date of Birth/Sex: 1960/06/05 (56 y.o. Male) Treating RN: Ashok Cordia, Debi Primary Care Marlana Mckowen: SYSTEM,  PCP Other Clinician: Referring Gloria Lambertson: Bary Castilla Treating Fusako Tanabe/Extender: STONE III, HOYT Weeks in Treatment: 1 Vital Signs Time Taken: 13:44 Temperature (F): 97.8 Weight (lbs): 211 Pulse (bpm): 79 Respiratory Rate (breaths/min): 18 Blood Pressure (mmHg): 177/94 Reference Range: 80 - 120 mg / dl Notes Made Hoytt, PA aware of pts BP. Electronic Signature(s) Signed: 08/16/2016 7:50:03 AM By: Alejandro Mulling Entered By: Alejandro Mulling on 08/15/2016 13:48:38

## 2016-08-17 NOTE — Progress Notes (Signed)
TAHSIN, BENYO ([id number]) Visit Report for 08/15/2016 Chief Complaint Document Details Patient Name: Justin Molina Date of Service: 08/15/2016 1:30 PM Medical Record Number: [id number] Patient Account Number: [id number] Date of Birth/Sex: 1960-06-04 (56 y.o. Male) Treating RN: Phillis Haggis Primary Care Provider: SYSTEM, PCP Other Clinician: Referring Provider: Bary Castilla Treating Provider/Extender: STONE III, HOYT Weeks in Treatment: 1 Information Obtained from: Patient Chief Complaint Patients presents for treatment of an open diabetic ulcer trauma to the right lateral leg which is had for about 4 weeks Electronic Signature(s) Signed: 08/16/2016 10:10:39 AM By: Lenda Kelp PA-C Entered By: Lenda Kelp on 08/15/2016 14:02:10 Justin Molina ([id number]) -------------------------------------------------------------------------------- HPI Details Patient Name: Justin Molina Date of Service: 08/15/2016 1:30 PM Medical Record Number: [id number] Patient Account Number: [id number] Date of Birth/Sex: 1960-06-22 (56 y.o. Male) Treating RN: Phillis Haggis Primary Care Provider: SYSTEM, PCP Other Clinician: Referring Provider: Bary Castilla Treating Provider/Extender: STONE III, HOYT Weeks in Treatment: 1 History of Present Illness HPI Description: 56 year old male who was seen a couple of weeks ago in the ER, with a history of leg pain and injury which he's had for about 4-6 weeks. He has a past medical history of diabetes mellitus, hypertension, stroke with right-sided deficits, chronic renal insufficiency. in the past he has had brain surgery, gastrostomy tube placement, hernia repair and a tracheostomy. he has never been a smoker. They may have been trauma due to hitting the leg against a sharp object and an initial hematoma then broke into her chronic wound. while in the ER a DVT study was done to rule out problems with thrombus in his right lower  extremity. X-ray of the leg was also negative for any subcutaneous gas. was placed on Keflex and 500 mg 3 times a day and referred to the wound center. recent blood work done in the ER showed that his blood glucose was 276 and other investigations showed a normal CBC, altered kidney functions and a review of his electronic medical records revealed that his last hemoglobin A1c done 2 years ago was 9.9% 08/15/16 on evaluation today patient's right lateral lower extremity wound appears to be doing about the same in regard to [facility] covering although maybe slightly smaller. With that being said he still has a significant amount of pain especially with cleansing of the wound and manipulation general. This wound is on his stroke affected side. His wife who was with him today does have many questions about his ability to travel apparently all dispositions are in [place] and he is running low on altered medications as he was supposed of already made the trip back to [place]. Unfortunately due to other issues he was deemed one seen in the ER up to be stable enough to travel back and therefore missed his ride home with his sons back to [place]. Right now they are working on whether they are gonna get him a plane ticket to fly back for potentially have to have someone come and get him to drive him back. Electronic Signature(s) Signed: 08/16/2016 10:10:39 AM By: Lenda Kelp PA-C Entered By: Lenda Kelp on 08/15/2016 14:28:18 JEMAINE, PROKOP ([id number]) -------------------------------------------------------------------------------- Physical Exam Details Patient Name: Justin Molina Date of Service: 08/15/2016 1:30 PM Medical Record Number: [id number] Patient Account Number: [id number] Date of Birth/Sex: [birth year] (56 y.o. Male) Treating RN: Phillis Haggis Primary Care Provider: SYSTEM, PCP Other Clinician: Referring Provider: Bary Castilla Treating Provider/Extender: STONE III,  HOYT Weeks in Treatment: 1 Constitutional Well-nourished and well-hydrated in no acute distress. Respiratory normal  breathing without difficulty. clear to auscultation bilaterally. Psychiatric this patient is able to make decisions and demonstrates good insight into disease process. Alert and Oriented x 3. pleasant and cooperative. Notes Patient's wound is slough covered although there does not appear to be any evidence of infection unfortunately due to tenderness he is not able to allow debridement today. Electronic Signature(s) Signed: 08/16/2016 10:10:39 AM By: Lenda Kelp PA-C Entered By: Lenda Kelp on 08/15/2016 14:28:48 Justin Molina ([id number]) -------------------------------------------------------------------------------- Physician Orders Details Patient Name: Justin Molina Date of Service: 08/15/2016 1:30 PM Medical Record Number: [id number] Patient Account Number: [id number] Date of Birth/Sex: 1961/01/28 (56 y.o. 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 o May shower with protection. Anesthetic Wound #1 Right Lower Leg o Topical Lidocaine 4% cream applied to wound  bed prior to debridement - In Clinic Primary Wound Dressing Wound #1 Right Lower Leg o Santyl Ointment Secondary Dressing Wound #1 Right Lower Leg o Gauze and Kerlix/Conform Dressing Change Frequency Wound #1 Right Lower Leg o Change dressing every day. Follow-up Appointments Wound #1 Right Lower Leg o Return Appointment in 1 week. ALPHONSA, BRICKLE ([id number]) Edema Control Wound #1 Right Lower Leg o Elevate legs to the level of the heart and pump ankles as often as possible Additional Orders / Instructions Wound #1 Right Lower Leg o Activity as tolerated o Other: - Vit A, C, Zinc Patient Medications Allergies: Iodinated Contrast- Oral and IV Dye Notifications Medication Indication Start End Santyl 08/15/2016 DOSE topical 250 unit/gram ointment - ointment topical applied nickel thick to the right lateral lower leg wound daily Notes I'm going to recommend that we continue with the Santyl for the next week. I will send in a refill for him today. Subsequently I'm hopeful that in the future as the pain subsides to a degree we will be able to perform debridement although based on what his wife was telling me today he will likely be traveling back home in about a week. Nonetheless I will keep the follow-up for next week until we hear otherwise that he will be leaving in order to make sure that he does not get lost to follow-up. They were appreciative of this. If anything worsens in the interim she will contact our office for additional recommendations. Electronic Signature(s) Signed: 08/15/2016 2:32:16 PM By: Lenda Kelp PA-C Entered By: Lenda Kelp on 08/15/2016 14:32:15 Justin Molina ([id number]) -------------------------------------------------------------------------------- Problem List Details Patient Name: Justin Molina Date of Service: 08/15/2016 1:30 PM Medical Record Number: [id number] Patient Account Number: [id number] Date of Birth/Sex:  November 12, 1960 (56 y.o. 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 Male) Treating RN: Phillis Haggis Primary Care Provider: SYSTEM, PCP Other Clinician: Referring Provider: Bary Castilla Treating Provider/Extender: STONE III, HOYT Weeks in Treatment: 1 Subjective Chief Complaint Information obtained from Patient Patients presents for treatment of an open diabetic ulcer trauma to the right lateral leg which is had for about 4 weeks History of Present Illness (HPI) 56 year old male who was seen a couple of weeks ago in the ER, with a history of leg pain and injury which he's had for about 4-6 weeks. He has a past medical history of diabetes mellitus, hypertension, stroke with right-sided deficits, chronic renal insufficiency. in the past he has had  brain surgery, gastrostomy tube placement, hernia repair and a tracheostomy. he has never been a smoker. They may have been trauma due to hitting the leg against a sharp object and an initial hematoma then broke into her chronic wound. while in the ER a DVT study was done to rule out problems with thrombus in his right lower extremity. X-ray of the leg was also negative for any subcutaneous gas. was placed on Keflex and 500 mg 3 times a day and referred to the wound center. recent blood work done in the ER showed that his blood glucose was 276 and other investigations showed a normal CBC, altered kidney functions and a review of his electronic medical records revealed that his last hemoglobin A1c done 2 years ago was 9.9% 08/15/16 on evaluation today patient's right lateral lower extremity wound appears to be doing about the same in regard to [facility] covering although maybe slightly smaller. With that being said he still has a significant amount of pain especially with cleansing of the wound and manipulation general. This wound is on his stroke affected side. His wife who was with him today does have many questions about his ability to travel apparently all dispositions are in [place] and he is running low on altered medications as he was supposed of already made the trip back to [place]. Unfortunately due to other issues he was deemed one seen in the ER up to be stable enough to travel back and therefore missed his ride home with his sons back to [place]. Right now they are working on whether they are gonna get him a plane ticket to fly back for potentially have to have someone come and get him to drive him back. Objective DAVIE, CLAUD ([id number]) Constitutional Well-nourished and well-hydrated in no acute distress. Vitals Time Taken: 1:44 PM, Weight: 211 lbs, Temperature: 97.8 F, Pulse: 79 bpm, Respiratory Rate: 18 breaths/min, Blood Pressure: 177/94 mmHg. General Notes: Made Hoytt,  PA aware of pts BP. Respiratory normal breathing without difficulty. clear to auscultation bilaterally. Psychiatric this patient is able to make decisions and demonstrates good insight into disease process. Alert and Oriented x 3. pleasant and cooperative. General Notes: Patient's wound is slough covered although there does not appear to be any evidence of infection  unfortunately due to tenderness he is not able to allow debridement today. Integumentary (Hair, Skin) Wound #1 status is Open. Original cause of wound was Gradually Appeared. The wound is located on the Right Lower Leg. The wound measures 2.4cm length x 1.7cm width x 0.7cm depth; 3.204cm^2 area and 2.243cm^3 volume. There is Fat Layer (Subcutaneous Tissue) Exposed exposed. There is no tunneling or undermining noted. There is a large amount of serosanguineous drainage noted. The wound margin is distinct with the outline attached to the wound base. There is small (1-33%) pink granulation within the wound bed. There is a medium (34-66%) amount of necrotic tissue within the wound bed including Eschar and Adherent Slough. The periwound skin appearance exhibited: Induration, Ecchymosis, Hemosiderin Staining, Mottled. The periwound skin appearance did not exhibit: Callus, Crepitus, Excoriation, Rash, Scarring, Dry/Scaly, Maceration, Atrophie Blanche, Cyanosis, Pallor, Rubor, Erythema. Periwound temperature was noted as No Abnormality. The periwound has tenderness on palpation. 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 May shower with protection. Anesthetic: Wound #1 Right Lower Leg: Topical Lidocaine 4% cream applied to wound bed prior to debridement - In Clinic Primary Wound Dressing: Wound #1 Right Lower Leg: Santyl Ointment Secondary Dressing: Wound #1 Right Lower Leg: Gauze and Kerlix/Conform Dressing Change Frequency: Wound #1 Right Lower Leg: Change dressing every day. Follow-up Appointments: Wound #1 Right Lower Leg: Return Appointment in 1 week. Edema Control: Wound #1 Right Lower Leg: Elevate legs to the level of the heart and pump ankles as often as possible Additional Orders / Instructions: Wound #1 Right Lower Leg: Activity as tolerated Other: - Vit A, C, Zinc The following medication(s) was prescribed: Santyl topical 250 unit/gram ointment ointment topical applied nickel thick to the right lateral lower leg wound daily starting 08/15/2016 General Notes: I'm going to recommend that we continue with the Santyl for the next week. I will send in a refill for him today. Subsequently I'm hopeful that in the future as the pain subsides to a degree we will be able to perform debridement although based on what his wife was telling me today he will likely be traveling back home in about a week. Nonetheless I will keep the follow-up for next week until we hear otherwise that he will be leaving in order to make sure that he does not get lost to follow-up. They were appreciative of this. If anything worsens in the interim she will contact our office for additional recommendations. Electronic Signature(s) Signed: 08/16/2016 10:10:39 AM By: Scharlene Gloss, Radames ([id number]) Entered By: Lenda Kelp on 08/15/2016 14:40:31 Justin Molina  ([id number]) -------------------------------------------------------------------------------- SuperBill Details Patient Name: Justin Molina Date of Service: 08/15/2016 Medical Record Number: [id number] Patient Account Number: [id number] Date of Birth/Sex: 1960/05/25 (55 y.o. 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 I73.9 Peripheral vascular disease, unspecified Facility Procedures CPT4 Code: [id number] Description: 99213 - WOUND CARE VISIT-LEV 3 EST PT Modifier: Quantity: 1 Physician Procedures CPT4 Code Description: [id number] 99213 - WC PHYS LEVEL 3 - EST PT ICD-10 Description Diagnosis E11.622 Type 2 diabetes mellitus with other skin ulcer I89.0 Lymphedema, not elsewhere classified S81.811A Laceration without foreign body, right lower leg, init  L97.212 Non-pressure chronic ulcer of right calf with fat laye Modifier: ial encounte r exposed Quantity: 1 r Electronic Signature(s) Signed: 08/16/2016 7:50:03 AM By: Alejandro MullingPinkerton, Debra Signed: 08/16/2016 10:10:39 AM By: Lenda KelpStone III, Hoyt PA-C Entered By: Alejandro MullingPinkerton, Debra on 08/15/2016 14:42:27

## 2016-08-23 ENCOUNTER — Ambulatory Visit: Payer: Medicare Other | Admitting: Surgery

## 2017-07-02 DIAGNOSIS — R4701 Aphasia: Secondary | ICD-10-CM | POA: Insufficient documentation

## 2021-01-04 DIAGNOSIS — I482 Chronic atrial fibrillation, unspecified: Secondary | ICD-10-CM | POA: Insufficient documentation

## 2021-01-04 DIAGNOSIS — I69351 Hemiplegia and hemiparesis following cerebral infarction affecting right dominant side: Secondary | ICD-10-CM | POA: Insufficient documentation

## 2021-01-04 DIAGNOSIS — I4891 Unspecified atrial fibrillation: Secondary | ICD-10-CM | POA: Insufficient documentation

## 2021-01-04 DIAGNOSIS — I69320 Aphasia following cerebral infarction: Secondary | ICD-10-CM | POA: Insufficient documentation

## 2021-01-04 HISTORY — DX: Chronic atrial fibrillation, unspecified: I48.20

## 2021-02-01 DIAGNOSIS — N2581 Secondary hyperparathyroidism of renal origin: Secondary | ICD-10-CM | POA: Insufficient documentation

## 2021-05-01 DIAGNOSIS — F0390 Unspecified dementia without behavioral disturbance: Secondary | ICD-10-CM | POA: Insufficient documentation

## 2021-11-17 ENCOUNTER — Ambulatory Visit (HOSPITAL_COMMUNITY)
Admission: EM | Admit: 2021-11-17 | Discharge: 2021-11-17 | Disposition: A | Discharge status: Home / Self Care | Payer: Medicare (Managed Care)

## 2021-11-17 ENCOUNTER — Encounter (HOSPITAL_COMMUNITY): Payer: Self-pay

## 2021-11-17 DIAGNOSIS — I10 Essential (primary) hypertension: Secondary | ICD-10-CM | POA: Diagnosis not present

## 2021-11-17 DIAGNOSIS — Z760 Encounter for issue of repeat prescription: Secondary | ICD-10-CM

## 2021-11-17 MED ORDER — BLOOD GLUCOSE MONITOR KIT: PACK | 0 refills | Status: DC

## 2021-11-17 MED ORDER — MULTAQ 400 MG PO TABS: 400.0000 mg | ORAL_TABLET | Freq: Two times a day (BID) | ORAL | 0 refills | Status: DC

## 2021-11-17 MED ORDER — DOXAZOSIN MESYLATE 8 MG PO TABS: 8.0000 mg | ORAL_TABLET | Freq: Every day | ORAL | 0 refills | Status: DC

## 2021-11-17 MED ORDER — BLOOD PRESSURE CUFF MISC: 1.0000 [IU] | Freq: Every day | 0 refills | Status: DC

## 2021-11-17 MED ORDER — ISOSORBIDE MONONITRATE ER 120 MG PO TB24: 120.0000 mg | ORAL_TABLET | Freq: Every day | ORAL | 0 refills | Status: DC

## 2021-11-17 MED ORDER — CARVEDILOL 25 MG PO TABS: 25.0000 mg | ORAL_TABLET | Freq: Two times a day (BID) | ORAL | 0 refills | Status: DC

## 2021-11-17 MED ORDER — POTASSIUM CHLORIDE CRYS ER 10 MEQ PO TBCR: 10.0000 meq | EXTENDED_RELEASE_TABLET | Freq: Every day | ORAL | 0 refills | Status: DC

## 2021-11-17 MED ORDER — HYDRALAZINE HCL 100 MG PO TABS: 100.0000 mg | ORAL_TABLET | Freq: Three times a day (TID) | ORAL | 0 refills | Status: DC

## 2021-11-17 MED ORDER — ELIQUIS 5 MG PO TABS: 5.0000 mg | ORAL_TABLET | Freq: Two times a day (BID) | ORAL | 0 refills | Status: AC

## 2021-11-17 MED ORDER — AMLODIPINE BESYLATE 5 MG PO TABS: 5.0000 mg | ORAL_TABLET | Freq: Every day | ORAL | 0 refills | Status: DC

## 2021-11-17 MED ORDER — ATORVASTATIN CALCIUM 80 MG PO TABS: 80.0000 mg | ORAL_TABLET | Freq: Every day | ORAL | 0 refills | Status: DC

## 2021-11-17 MED ORDER — BISACODYL 5 MG PO TBEC: 5.0000 mg | DELAYED_RELEASE_TABLET | Freq: Two times a day (BID) | ORAL | 2 refills | Status: DC

## 2021-11-17 NOTE — ED Provider Notes (Signed)
[place]    CSN: [id number] Arrival date & time: 11/17/21  1053      History   Chief Complaint Chief Complaint  Patient presents with   Medication Refill   Constipation    HPI Justin Molina. is a 61 y.o. male.   61 year old male presents for medication refills.  The patient is accompanied by his wife 48 years.  She relates that he recently was released from a rehab facility due to having a stroke.  She indicates that he is doing well, and they have been in the area for about 3 weeks.  She indicates that he has ran out of 4 of his medicines and has another 4 medicines that he only has a limited supply of.  She indicates that she is currently trying to transfer his healthcare advantage and Medicare from [place] to the local area here in [place].  She indicates that he is currently doing well and is not having any problems.  He takes his medicines on a regular basis.  And she is currently trying to get him established with an internal medicine specialist so that his health condition can be monitored on a regular basis.  He does have hypertension which she is taking medication for and this is controlling this condition, he also has diabetes which he is using insulin for and it is helping to control his glucose levels.  He does not have any problems or side effects with this medication.  His wife indicates that he does need a stool softener last laxative as he is prone to having constipation. She also request for a blood pressure machine, glucometer machine with lancets to be sent to the pharmacy.  The wife has been given information to go on line at www..: [email or web address] in order to set up an internal medicine specialist appointment for her husband.   Medication Refill Constipation   Past Medical History:  Diagnosis Date   Chronic renal insufficiency    Diabetes mellitus without complication ([place])    Hypertension    Stroke [facility])     Patient Active  Problem List   Diagnosis Date Noted   CKD (chronic kidney disease) stage 3, GFR 30-59 ml/min ([place]) 08/24/2014   HLD (hyperlipidemia) 08/24/2014   Anxiety 08/24/2014   DM type 2, uncontrolled, with renal complications 44/04/4740   Diabetic neuropathy ([place]) 08/12/2014   HTN (hypertension) 08/12/2014   History of CVA with residual deficit 08/12/2014    Past Surgical History:  Procedure Laterality Date   [place] Medications    Prior to Admission medications   Medication Sig Start Date End Date Taking? Authorizing Provider  bisacodyl (DULCOLAX) 5 MG EC tablet Take 1 tablet (5 mg total) by mouth 2 (two) times daily. 11/17/21  Yes Nyoka Lint, PA-C  blood glucose meter kit and supplies KIT Dispense based on patient and insurance preference. Use up to four times daily as directed. 11/17/21  Yes Nyoka Lint, PA-C  Blood Pressure Monitoring (BLOOD PRESSURE CUFF) MISC 1 Units by Does not apply route daily. 11/17/21  Yes Nyoka Lint, PA-C  insulin detemir (LEVEMIR) 100 UNIT/ML injection Inject 6 Units into the skin at bedtime.   Yes [provider]  Misc. Devices (BATHTUB SAFETY RAIL) MISC Patient needs safety rails for shower 01/04/15  Yes Lance Bosch, NP  traMADol (  ULTRAM) 50 MG tablet Take 50 mg by mouth every 6 (six) hours as needed for moderate pain or severe pain.   Yes [provider]  acetaminophen (TYLENOL) 500 MG tablet Take 1 tablet (500 mg total) by mouth every 6 (six) hours as needed. 12/26/14   Glendell Docker, NP  amLODipine (NORVASC) 5 MG tablet Take 1 tablet (5 mg total) by mouth daily. 11/17/21   Nyoka Lint, PA-C  atorvastatin (LIPITOR) 80 MG tablet Take 1 tablet (80 mg total) by mouth daily. 11/17/21   Nyoka Lint, PA-C  Blood Glucose Monitoring Suppl (ACCU-CHEK AVIVA PLUS) W/DEVICE KIT Check blood sugar TID  & QHS 11/17/14   Lance Bosch, NP  carvedilol  (COREG) 25 MG tablet Take 1 tablet (25 mg total) by mouth 2 (two) times daily with a meal. Patient needs office visit for more refills 11/17/21   Nyoka Lint, PA-C  doxazosin (CARDURA) 8 MG tablet Take 1 tablet (8 mg total) by mouth daily. 11/17/21   Nyoka Lint, PA-C  ELIQUIS 5 MG TABS tablet Take 1 tablet (5 mg total) by mouth 2 (two) times daily. 11/17/21   Nyoka Lint, PA-C  glucose blood (ACCU-CHEK AVIVA PLUS) test strip Use as instructed for TID and QHS blood glucose testing 05/23/15   Tresa Garter, MD  hydrALAZINE (APRESOLINE) 100 MG tablet Take 1 tablet (100 mg total) by mouth 3 (three) times daily. 11/17/21   Nyoka Lint, PA-C  isosorbide mononitrate (IMDUR) 120 MG 24 hr tablet Take 1 tablet (120 mg total) by mouth daily. 11/17/21   Nyoka Lint, PA-C  Lancets 28G MISC Check blood sugar TID & QHS 05/23/15   Jegede, Marlena Clipper, MD  MULTAQ 400 MG tablet Take 1 tablet (400 mg total) by mouth 2 (two) times daily. 11/17/21   Nyoka Lint, PA-C  potassium chloride (KLOR-CON M) 10 MEQ tablet Take 1 tablet (10 mEq total) by mouth daily. 11/17/21   Nyoka Lint, PA-C    Family History History reviewed. No pertinent family history.  Social History Social History   Tobacco Use   Smoking status: Never   Smokeless tobacco: Never  Substance Use Topics   Alcohol use: No   Drug use: No     Allergies   Contrast media [iodinated contrast media]   Review of Systems Review of Systems  Gastrointestinal:  Positive for constipation.     Physical Exam Triage Vital Signs ED Triage Vitals  Enc Vitals Group     BP 11/17/21 1200 (!) 152/94     Pulse Rate 11/17/21 1200 96     Resp 11/17/21 1200 16     Temp 11/17/21 1200 98.1 F (36.7 C)     Temp Source 11/17/21 1200 Oral     SpO2 11/17/21 1200 96 %     Weight --      Height --      Head Circumference --      Peak Flow --      Pain Score 11/17/21 1202 0     Pain Loc --      Pain Edu? --      Excl. in [place]? --    No data  found.  Updated Vital Signs BP (!) 152/94 (BP Location: Left Arm)   Pulse 96   Temp 98.1 F (36.7 C) (Oral)   Resp 16   SpO2 96%   Visual Acuity Right Eye Distance:   Left Eye Distance:   Bilateral Distance:    Right Eye Near:  Left Eye Near:    Bilateral Near:     Physical Exam Constitutional:      Appearance: Normal appearance.  Neck:     Vascular: No carotid bruit.  Cardiovascular:     Rate and Rhythm: Rhythm irregular.     Heart sounds: Normal heart sounds.  Pulmonary:     Effort: Pulmonary effort is normal.     Breath sounds: Normal breath sounds and air entry. No wheezing, rhonchi or rales.  Lymphadenopathy:     Cervical: No cervical adenopathy.  Neurological:     Mental Status: He is alert.      UC Treatments / Results  Labs (all labs ordered are listed, but only abnormal results are displayed) Labs Reviewed - No data to display  EKG   Radiology No results found.  Procedures Procedures (including critical care time)  Medications Ordered in UC Medications - No data to display  Initial Impression / Assessment and Plan / UC Course  I have reviewed the triage vital signs and the nursing notes.  Pertinent labs & imaging results that were available during my care of the patient were reviewed by me and considered in my medical decision making (see chart for details).    Plan: 1.  The medication refills will be addressed with the following: A.  Amlodipine 5 mg daily to help control blood pressure. B.  Lipitor 80 mg once daily to help control hyperlipidemia. C.  Dulcolax 5 mg twice daily to help control constipation. D.  Coreg 25 mg twice daily to help control blood pressure and heart abnormalities. C.  Cardura 8 mg daily to help control blood pressure and heart related issues. D.  Eliquis 5 mg twice daily in order to help promote blood flow as a thinner and prevent additional stroke recurrence. E.  Apresoline 100 mg 3 times a day, to help control  blood pressure, heart related issues, and fluid retention. F.  Imdur daily in order to help prevent vasoconstriction. G.  Klor-Con 10 mEq daily in order to help prevent from potassium depletion. H.  Multaq 400 mg twice daily to help control A-fib, this is an ongoing continuing medication for the patient. 2.  Patient has been provided with information on line, www.: DebtSupply.pl in order to become established with an internal medicine specialist. 3.  Patient advised to return to urgent care as needed. Final Clinical Impressions(s) / UC Diagnoses   Final diagnoses:  Medication refill  Primary hypertension     Discharge Instructions      All medications have been refilled for 90 days.  Advised to go on the Internet and typing www.: [email or web address] and this should bring you to a screen where you are able to choose Primary Care - New Patient, then select  Internal Medicine, then select New Patient.  This screen will bring up locations so that you can find one that is close to your residence and try to call and set up an appointment for Justin Molina to become established and be evaluated. Return to urgent care as needed.    ED Prescriptions     Medication Sig Dispense Auth. Provider   doxazosin (CARDURA) 8 MG tablet Take 1 tablet (8 mg total) by mouth daily. 90 tablet Nyoka Lint, PA-C   potassium chloride (KLOR-CON M) 10 MEQ tablet Take 1 tablet (10 mEq total) by mouth daily. 90 tablet Nyoka Lint, PA-C   carvedilol (COREG) 25 MG tablet Take 1 tablet (25 mg total) by mouth 2 (two) times  daily with a meal. Patient needs office visit for more refills 180 tablet Nyoka Lint, PA-C   isosorbide mononitrate (IMDUR) 120 MG 24 hr tablet Take 1 tablet (120 mg total) by mouth daily. 90 tablet Nyoka Lint, PA-C   amLODipine (NORVASC) 5 MG tablet Take 1 tablet (5 mg total) by mouth daily. 90 tablet Nyoka Lint, PA-C   atorvastatin (LIPITOR) 80 MG tablet Take 1 tablet (80 mg total)  by mouth daily. 90 tablet Nyoka Lint, PA-C   ELIQUIS 5 MG TABS tablet Take 1 tablet (5 mg total) by mouth 2 (two) times daily. 180 tablet Nyoka Lint, PA-C   MULTAQ 400 MG tablet Take 1 tablet (400 mg total) by mouth 2 (two) times daily. 180 tablet Nyoka Lint, PA-C   hydrALAZINE (APRESOLINE) 100 MG tablet Take 1 tablet (100 mg total) by mouth 3 (three) times daily. 270 tablet Nyoka Lint, PA-C   bisacodyl (DULCOLAX) 5 MG EC tablet Take 1 tablet (5 mg total) by mouth 2 (two) times daily. 60 tablet Nyoka Lint, PA-C   Blood Pressure Monitoring (BLOOD PRESSURE CUFF) MISC 1 Units by Does not apply route daily. 1 each Nyoka Lint, PA-C   blood glucose meter kit and supplies KIT Dispense based on patient and insurance preference. Use up to four times daily as directed. 1 each Nyoka Lint, PA-C      PDMP not reviewed this encounter.   Nyoka Lint, PA-C 11/17/21 1300

## 2021-11-17 NOTE — Discharge Instructions (Addendum)
All medications have been refilled for 90 days.  Advised to go on the Internet and typing www.: [email or web address] and this should bring you to a screen where you are able to choose Primary Care - New Patient, then select  Internal Medicine, then select New Patient.  This screen will bring up locations so that you can find one that is close to your residence and try to call and set up an appointment for Justin Molina to become established and be evaluated. Return to urgent care as needed.

## 2021-11-17 NOTE — ED Triage Notes (Signed)
Patient needing a refill of his medications. Moved here from [place] 3 weeks ago.   Need rx for constipation as well.

## 2021-12-14 DIAGNOSIS — K5900 Constipation, unspecified: Secondary | ICD-10-CM | POA: Diagnosis present

## 2021-12-14 DIAGNOSIS — I2510 Atherosclerotic heart disease of native coronary artery without angina pectoris: Secondary | ICD-10-CM | POA: Diagnosis present

## 2022-01-05 DIAGNOSIS — N400 Enlarged prostate without lower urinary tract symptoms: Secondary | ICD-10-CM | POA: Diagnosis present

## 2022-01-06 ENCOUNTER — Encounter (HOSPITAL_COMMUNITY): Payer: Self-pay | Admitting: Emergency Medicine

## 2022-01-06 ENCOUNTER — Other Ambulatory Visit: Payer: Self-pay

## 2022-01-06 ENCOUNTER — Emergency Department (HOSPITAL_COMMUNITY)
Admission: EM | Admit: 2022-01-06 | Discharge: 2022-01-06 | Disposition: A | Discharge status: Home / Self Care | Payer: Medicare HMO | Attending: Emergency Medicine | Admitting: Emergency Medicine

## 2022-01-06 DIAGNOSIS — Z7901 Long term (current) use of anticoagulants: Secondary | ICD-10-CM | POA: Diagnosis not present

## 2022-01-06 DIAGNOSIS — Z794 Long term (current) use of insulin: Secondary | ICD-10-CM | POA: Diagnosis not present

## 2022-01-06 DIAGNOSIS — Z8673 Personal history of transient ischemic attack (TIA), and cerebral infarction without residual deficits: Secondary | ICD-10-CM | POA: Diagnosis not present

## 2022-01-06 DIAGNOSIS — N179 Acute kidney failure, unspecified: Secondary | ICD-10-CM | POA: Diagnosis not present

## 2022-01-06 DIAGNOSIS — Z79899 Other long term (current) drug therapy: Secondary | ICD-10-CM | POA: Insufficient documentation

## 2022-01-06 DIAGNOSIS — Z1152 Encounter for screening for COVID-19: Secondary | ICD-10-CM | POA: Insufficient documentation

## 2022-01-06 DIAGNOSIS — I10 Essential (primary) hypertension: Secondary | ICD-10-CM | POA: Diagnosis not present

## 2022-01-06 DIAGNOSIS — R112 Nausea with vomiting, unspecified: Secondary | ICD-10-CM

## 2022-01-06 DIAGNOSIS — E119 Type 2 diabetes mellitus without complications: Secondary | ICD-10-CM | POA: Insufficient documentation

## 2022-01-06 DIAGNOSIS — R197 Diarrhea, unspecified: Secondary | ICD-10-CM | POA: Diagnosis present

## 2022-01-06 LAB — COMPREHENSIVE METABOLIC PANEL
ALT: 16 U/L (ref 0–44)
AST: 22 U/L (ref 15–41)
Albumin: 3.5 g/dL (ref 3.5–5.0)
Alkaline Phosphatase: 62 U/L (ref 38–126)
Anion gap: 9 (ref 5–15)
BUN: 27 mg/dL — ABNORMAL HIGH (ref 8–23)
CO2: 24 mmol/L (ref 22–32)
Calcium: 7.9 mg/dL — ABNORMAL LOW (ref 8.9–10.3)
Chloride: 105 mmol/L (ref 98–111)
Creatinine, Ser: 2.16 mg/dL — ABNORMAL HIGH (ref 0.61–1.24)
GFR, Estimated: 34 mL/min — ABNORMAL LOW (ref >60)
Glucose, Bld: 163 mg/dL — ABNORMAL HIGH (ref 70–99)
Potassium: 3 mmol/L — ABNORMAL LOW (ref 3.5–5.1)
Sodium: 138 mmol/L (ref 135–145)
Total Bilirubin: 1 mg/dL (ref 0.3–1.2)
Total Protein: 7.1 g/dL (ref 6.5–8.1)

## 2022-01-06 LAB — CBC WITH DIFFERENTIAL/PLATELET
Abs Immature Granulocytes: 0.01 10*3/uL (ref 0.00–0.07)
Basophils Absolute: 0 10*3/uL (ref 0.0–0.1)
Basophils Relative: 0 %
Eosinophils Absolute: 0 10*3/uL (ref 0.0–0.5)
Eosinophils Relative: 0 %
HCT: 39.5 % (ref 39.0–52.0)
Hemoglobin: 13.3 g/dL (ref 13.0–17.0)
Immature Granulocytes: 0 %
Lymphocytes Relative: 11 %
Lymphs Abs: 0.5 10*3/uL — ABNORMAL LOW (ref 0.7–4.0)
MCH: 27.7 pg (ref 26.0–34.0)
MCHC: 33.7 g/dL (ref 30.0–36.0)
MCV: 82.1 fL (ref 80.0–100.0)
Monocytes Absolute: 0.5 10*3/uL (ref 0.1–1.0)
Monocytes Relative: 10 %
Neutro Abs: 3.9 10*3/uL (ref 1.7–7.7)
Neutrophils Relative %: 79 %
Platelets: 150 10*3/uL (ref 150–400)
RBC: 4.81 MIL/uL (ref 4.22–5.81)
RDW: 15.2 % (ref 11.5–15.5)
WBC: 5 10*3/uL (ref 4.0–10.5)
nRBC: 0 % (ref 0.0–0.2)

## 2022-01-06 LAB — RESP PANEL BY RT-PCR (FLU A&B, COVID) ARPGX2
Influenza A by PCR: NEGATIVE
Influenza B by PCR: NEGATIVE
SARS Coronavirus 2 by RT PCR: NEGATIVE

## 2022-01-06 LAB — CBG MONITORING, ED: Glucose-Capillary: 148 mg/dL — ABNORMAL HIGH (ref 70–99)

## 2022-01-06 LAB — MAGNESIUM: Magnesium: 1.9 mg/dL (ref 1.7–2.4)

## 2022-01-06 MED ORDER — SODIUM CHLORIDE 0.9 % IV BOLUS
1000.0000 mL | Freq: Once | INTRAVENOUS | Status: AC
  Administered 2022-01-06: 1000 mL via INTRAVENOUS

## 2022-01-06 MED ORDER — POTASSIUM CHLORIDE CRYS ER 20 MEQ PO TBCR
40.0000 meq | EXTENDED_RELEASE_TABLET | Freq: Once | ORAL | Status: AC
  Administered 2022-01-06: 40 meq via ORAL
  Filled 2022-01-06: qty 2

## 2022-01-06 MED ORDER — ONDANSETRON 4 MG PO TBDP: 4.0000 mg | ORAL_TABLET | Freq: Three times a day (TID) | ORAL | 0 refills | Status: DC | PRN

## 2022-01-06 MED ORDER — LOPERAMIDE HCL 2 MG PO CAPS: 2.0000 mg | ORAL_CAPSULE | Freq: Four times a day (QID) | ORAL | 0 refills | Status: DC | PRN

## 2022-01-06 NOTE — ED Notes (Signed)
Discharge instructions reviewed with patient and family. Patient and family deny any questions or concerns at this time. Patient out to lobby with daughter.

## 2022-01-06 NOTE — Discharge Instructions (Signed)
Make sure you are hydrating well, take Zofran as prescribed once every 8 hours for 3 doses and then as needed for nausea and vomiting.  Take Imodium as prescribed.  I recommend bland foods including bananas applesauce rice rice cakes toast.  You may also augment your hydration with pedialyte -- I recommend unflavored since there is lots of sugar in the flavored versions.

## 2022-01-06 NOTE — Consult Note (Signed)
    Consult Note Service Pager: [phone]  Patient name: Justin Molina. Medical record number: [id number] Date of Birth: 01-28-1961 Age: 61 y.o. Gender: male  HPI: Patient presents with diarrhea and vomiting which started last night.  He has had approximately 4 episodes of nonbloody diarrhea and 3 episodes of vomiting.  Wife sick with similar symptoms.  They wonder whether it could be food poisoning from their [employer] last night.  No abdominal pain, no fever, no URI symptoms.  He is asking for something to eat, states he is very hungry and his appetite is intact.  Of note, patient recently moved to the area.  Just established with a PCP yesterday.  Previously followed by nephrology, cardiology, and oncology when [place] out of state.  Has specialist referrals pending from his new PCP.  Lives with his wife and daughter.  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   Pertinent Past Medical History: H/o CVA with residual R sided deficits CKD Stage IIIb T2DM HTN A-fib MGUS  Physical Exam: BP (!) 143/89   Pulse 96   Temp 98.7 F (37.1 C) (Oral)   Resp 18   Ht 5\' 11"  (1.803 m)   Wt 122.5 kg   SpO2 98%   BMI 37.66 kg/m  GEN: Alert, well-appearing, NAD HEENT: Moist mucous membranes CV: RRR, normal S1/S2 RESP: Normal effort on room air, lungs CTAB GI: Normal active bowel sounds, abdomen soft, nontender, nondistended NEURO: Alert, oriented x 3, some expressive aphasia which is at baseline per family, R sided weakness from prior CVA, able to ambulate to bathroom with walker  Labs:  CBC BMET  Recent Labs  Lab 01/06/22 1130  WBC 5.0  HGB 13.3  HCT 39.5  PLT 150   Recent Labs  Lab 01/06/22 1130  NA 138  K 3.0*  CL 105  CO2 24  BUN 27*  CREATININE 2.16*  GLUCOSE 163*  CALCIUM 7.9*      Assessment and Plan: Justin Molina. is a 61 y.o. male presenting with mild AKI in the setting of acute  gastroenteritis.   Viral Gastroenteritis Presentation suspicious for viral gastroenteritis.  Less likely food poisoning.  With presence of vomiting and no leukocytosis, no suspicion for C. Diff. -Passed PO challenge in the ED; eagerly consumed graham crackers and oral fluids -Safe for discharge from the ED at this time. Family agreeable with plan -ED provider sent Rx for prn Zofran and Imodium -Return precautions reviewed  AKI on CKD Cr 2.16 in the ED. Baseline creatinine appears to fluctuate between 1.7-2.7. Does not appear to be a significant AKI. -s/p 2L NS bolus in the ED -encouraged p.o. hydration at home  -advised PCP and nephro follow-up  Hypokalemia K 3.0 in the ED.  -Repleted with 40 mEq PO potassium -Patient to continue his home potassium (77 daily) upon discharge. F/u with PCP   Disposition: Home (discharge from ED)    , MD 01/06/2022, 4:23 PM PGY-3, [facility]  FPTS Intern pager: [phone], text pages welcome Secure chat group [facility] Teaching Service

## 2022-01-06 NOTE — ED Provider Notes (Signed)
[facility] EMERGENCY DEPARTMENT Provider Note   CSN: [id number] Arrival date & time: 01/06/22  1115     History  Chief Complaint  Patient presents with   Diarrhea    Justin Dwan. is a 61 y.o. male.   Diarrhea    Patient with medical history of chronic renal insufficiency, diabetes, hypertension, previous stroke with expressive aphasia and right-sided weakness is permanent deficits presents to the ED due to diarrhea.  Patient states he had a family member started having diarrhea after eating at a restaurant yesterday.  No antibiotic use or recent travel.  He reports 3 episodes of nonbilious nonbloody diarrhea yesterday, none upon arrival.  He endorses some nausea. There was vomiting, not having any abdominal pain.  He feels "tired" but denies any new lateralized weakness or numbness.  Patient's wife is home sick with the same symptoms.  Home Medications Prior to Admission medications   Medication Sig Start Date End Date Taking? Authorizing Provider  acetaminophen (TYLENOL) 500 MG tablet Take 1 tablet (500 mg total) by mouth every 6 (six) hours as needed. 12/26/14   Glendell Docker, NP  amLODipine (NORVASC) 5 MG tablet Take 1 tablet (5 mg total) by mouth daily. 11/17/21   Nyoka Lint, PA-C  atorvastatin (LIPITOR) 80 MG tablet Take 1 tablet (80 mg total) by mouth daily. 11/17/21   Nyoka Lint, PA-C  bisacodyl (DULCOLAX) 5 MG EC tablet Take 1 tablet (5 mg total) by mouth 2 (two) times daily. 11/17/21   Nyoka Lint, PA-C  blood glucose meter kit and supplies KIT Dispense based on patient and insurance preference. Use up to four times daily as directed. 11/17/21   Nyoka Lint, PA-C  Blood Glucose Monitoring Suppl (ACCU-CHEK AVIVA PLUS) W/DEVICE KIT Check blood sugar TID  & QHS 11/17/14   Lance Bosch, NP  Blood Pressure Monitoring (BLOOD PRESSURE CUFF) MISC 1 Units by Does not apply route daily. 11/17/21   Nyoka Lint, PA-C  carvedilol (COREG) 25 MG  tablet Take 1 tablet (25 mg total) by mouth 2 (two) times daily with a meal. Patient needs office visit for more refills 11/17/21   Nyoka Lint, PA-C  doxazosin (CARDURA) 8 MG tablet Take 1 tablet (8 mg total) by mouth daily. 11/17/21   Nyoka Lint, PA-C  ELIQUIS 5 MG TABS tablet Take 1 tablet (5 mg total) by mouth 2 (two) times daily. 11/17/21   Nyoka Lint, PA-C  glucose blood (ACCU-CHEK AVIVA PLUS) test strip Use as instructed for TID and QHS blood glucose testing 05/23/15   Tresa Garter, MD  hydrALAZINE (APRESOLINE) 100 MG tablet Take 1 tablet (100 mg total) by mouth 3 (three) times daily. 11/17/21   Nyoka Lint, PA-C  insulin detemir (LEVEMIR) 100 UNIT/ML injection Inject 6 Units into the skin at bedtime.    [provider]  isosorbide mononitrate (IMDUR) 120 MG 24 hr tablet Take 1 tablet (120 mg total) by mouth daily. 11/17/21   Nyoka Lint, PA-C  Lancets 28G MISC Check blood sugar TID & QHS 05/23/15   Tresa Garter, MD  Misc. Devices (BATHTUB SAFETY RAIL) MISC Patient needs safety rails for shower 01/04/15   Chari Manning A, NP  MULTAQ 400 MG tablet Take 1 tablet (400 mg total) by mouth 2 (two) times daily. 11/17/21   Nyoka Lint, PA-C  potassium chloride (KLOR-CON M) 10 MEQ tablet Take 1 tablet (10 mEq total) by mouth daily. 11/17/21   Nyoka Lint, PA-C  traMADol (ULTRAM) 50 MG tablet  Take 50 mg by mouth every 6 (six) hours as needed for moderate pain or severe pain.    [provider]      Allergies    Contrast media [iodinated contrast media]    Review of Systems   Review of Systems  Gastrointestinal:  Positive for diarrhea.    Physical Exam Updated Vital Signs BP (!) 143/89   Pulse 96   Temp 98.7 F (37.1 C) (Oral)   Resp 18   Ht _0  (1.803 m)   Wt 122.5 kg   SpO2 98%   BMI 37.66 kg/m  Physical Exam Vitals and nursing note reviewed. Exam conducted with a chaperone present.  Constitutional:      Appearance: Normal appearance.   HENT:     Head: Normocephalic and atraumatic.     Mouth/Throat:     Mouth: Mucous membranes are moist.  Eyes:     General: No scleral icterus.       Right eye: No discharge.        Left eye: No discharge.     Extraocular Movements: Extraocular movements intact.     Pupils: Pupils are equal, round, and reactive to light.  Cardiovascular:     Rate and Rhythm: Normal rate and regular rhythm.     Pulses: Normal pulses.     Heart sounds: Normal heart sounds. No murmur heard.    No friction rub. No gallop.  Pulmonary:     Effort: Pulmonary effort is normal. No respiratory distress.     Breath sounds: Normal breath sounds.  Abdominal:     General: Abdomen is flat. Bowel sounds are normal. There is no distension.     Palpations: Abdomen is soft.     Tenderness: There is no abdominal tenderness.     Comments: Nontender  Skin:    General: Skin is warm and dry.     Coloration: Skin is not jaundiced.  Neurological:     Mental Status: He is alert. Mental status is at baseline.     Coordination: Coordination normal.     ED Results / Procedures / Treatments   Labs (all labs ordered are listed, but only abnormal results are displayed) Labs Reviewed  CBC WITH DIFFERENTIAL/PLATELET - Abnormal; Notable for the following components:      Result Value   Lymphs Abs 0.5 (*)    All other components within normal limits  COMPREHENSIVE METABOLIC PANEL - Abnormal; Notable for the following components:   Potassium 3.0 (*)    Glucose, Bld 163 (*)    BUN 27 (*)    Creatinine, Ser 2.16 (*)    Calcium 7.9 (*)    GFR, Estimated 34 (*)    All other components within normal limits  CBG MONITORING, ED - Abnormal; Notable for the following components:   Glucose-Capillary 148 (*)    All other components within normal limits  RESP PANEL BY RT-PCR (FLU A&B, COVID) ARPGX2  GASTROINTESTINAL PANEL BY PCR, STOOL (REPLACES STOOL CULTURE)  MAGNESIUM  URINALYSIS, ROUTINE W REFLEX MICROSCOPIC     EKG None  Radiology No results found.  Procedures Procedures    Medications Ordered in ED Medications  sodium chloride 0.9 % bolus 1,000 mL (has no administration in time range)  potassium chloride SA (KLOR-CON M) CR tablet 40 mEq (has no administration in time range)  sodium chloride 0.9 % bolus 1,000 mL (0 mLs Intravenous Stopped 01/06/22 1506)    ED Course/ Medical Decision Making/ A&P Clinical Course as of  01/06/22 1507  Sat Jan 06, 2022  1159 CBC with Differential(!) No leukocytosis or anemia [HS]  1159 POC CBG, ED(!) Slightly elevated at 148 [HS]  1223 Comprehensive metabolic panel(!) Worsening renal function with AKI, last creatinine in June of this year was 1.7.  2.16 today.  Also hypokalemic with a potassium of 3.  No transaminitis.  No anion gap. [HS]  1583 Resp Panel by RT-PCR (Flu A&B, Covid) Anterior Nasal Swab COVID and flu negative [HS]  1234 Magnesium Within normal limits [HS]    Clinical Course User Index [HS] Sherrill Raring, PA-C                           Medical Decision Making Amount and/or Complexity of Data Reviewed Labs: ordered. Decision-making details documented in ED Course.  Risk Prescription drug management. Decision regarding hospitalization.   Patient presents due to episode of diarrhea and generalized fatigue.  Differential includes but not limited to AKI, metabolic abnormality, dehydration.  Patient meets no SIRS criteria on exam, does not appear septic.  His abdomen is soft non-tender. -BP (!) 143/89   Pulse 96   Temp 98.7 F (37.1 C) (Oral)   Resp 18   Ht _0  (1.803 m)   Wt 122.5 kg   SpO2 98%   BMI 37.66 kg/m   I ordered, viewed and interpreted laboratory work-up as documented ED course.  I considered sending the patient home but he has worsening renal function and new AKI with a creatinine of 2.17 of an increase of his baseline of 1.7 in June 2023.  Suspect secondary from volume dilution due to vomiting and diarrhea.   I have given him a liter of fluid, discussed with my attending who advises that given patient is symptomatic I should proceed to bring patient in for admission.  I consulted with the family teaching service, they will come see the patient.         Final Clinical Impression(s) / ED Diagnoses Final diagnoses:  AKI (acute kidney injury) ([place])  Nausea vomiting and diarrhea    Rx / DC Orders ED Discharge Orders     None         Sherrill Raring, PA-C 01/06/22 1507    Lacretia Leigh, MD 01/07/22 [phone]

## 2022-01-06 NOTE — ED Provider Notes (Signed)
Accepted handoff at shift change from [facility]. Please see prior provider note for more detail.   Briefly: Patient is 61 y.o.    Plan: Family medicine teaching service has been consulted for admission for dehydration and hypokalemia.   They are recommending discharge.    Physical Exam  BP (!) 143/89   Pulse 96   Temp 98.7 F (37.1 C) (Oral)   Resp 18   Ht 5\' 11"  (1.803 m)   Wt 122.5 kg   SpO2 98%   BMI 37.66 kg/m   Physical Exam Vitals and nursing note reviewed.  Constitutional:      General: He is not in acute distress. HENT:     Head: Normocephalic and atraumatic.     Nose: Nose normal.     Mouth/Throat:     Mouth: Mucous membranes are moist.  Eyes:     General: No scleral icterus. Cardiovascular:     Rate and Rhythm: Normal rate and regular rhythm.     Pulses: Normal pulses.     Heart sounds: Normal heart sounds.  Pulmonary:     Effort: Pulmonary effort is normal. No respiratory distress.     Breath sounds: No wheezing.  Abdominal:     Palpations: Abdomen is soft.     Tenderness: There is no abdominal tenderness. There is no guarding or rebound.  Musculoskeletal:     Cervical back: Normal range of motion.     Right lower leg: No edema.     Left lower leg: No edema.  Skin:    General: Skin is warm and dry.     Capillary Refill: Capillary refill takes less than 2 seconds.  Neurological:     Mental Status: He is alert. Mental status is at baseline.  Psychiatric:        Mood and Affect: Mood normal.        Behavior: Behavior normal.     Procedures  Procedures  ED Course / MDM   Clinical Course as of 01/06/22 1711  Sat Jan 06, 2022  1159 CBC with Differential(!) No leukocytosis or anemia [HS]  1159 POC CBG, ED(!) Slightly elevated at 148 [HS]  1223 Comprehensive metabolic panel(!) Worsening renal function with AKI, last creatinine in June of this year was 1.7.  2.16 today.  Also hypokalemic with a potassium of 3.  No transaminitis.  No anion gap.  [HS]  1234 Resp Panel by RT-PCR (Flu A&B, Covid) Anterior Nasal Swab COVID and flu negative [HS]  1234 Magnesium Within normal limits [HS]    Clinical Course User Index [HS] July, PA-C   Medical Decision Making Amount and/or Complexity of Data Reviewed Labs: ordered. Decision-making details documented in ED Course.  Risk Prescription drug management.   Family medicine teaching service is currently writing a note regarding their thoughts on why he does not meet inpatient criteria.  I discussed with patient at bedside and he is in agreement with plan to discharge home.  He is finishing IV hydration and is tolerating p.o.  Abdomen soft nontender.  Has received LR and IV potassium.  Creatinine slightly elevated 2.16 today in the setting of dehydration.  Receiving 2 L of crystalloid and plan is to follow-up with PCP to recheck labs early next week   Theron Arista, Gailen Shelter 01/06/22 1714    14/02/23, MD 01/06/22 1739

## 2022-01-06 NOTE — ED Triage Notes (Signed)
Pt BIB EMS for diarrhea that started last night. Pt reports multiple episodes. Pt has right sided weakness and expressive aphasia from previous stroke.   EMS Vitals 150/80 CBG 177 99.3 18 RR 96 HR

## 2022-01-18 ENCOUNTER — Ambulatory Visit (INDEPENDENT_AMBULATORY_CARE_PROVIDER_SITE_OTHER): Payer: Medicare HMO | Admitting: Podiatry

## 2022-01-18 VITALS — BP 155/82 | HR 70

## 2022-01-18 DIAGNOSIS — I6930 Unspecified sequelae of cerebral infarction: Secondary | ICD-10-CM | POA: Diagnosis not present

## 2022-01-18 DIAGNOSIS — M79675 Pain in left toe(s): Secondary | ICD-10-CM | POA: Diagnosis not present

## 2022-01-18 DIAGNOSIS — E1140 Type 2 diabetes mellitus with diabetic neuropathy, unspecified: Secondary | ICD-10-CM

## 2022-01-18 DIAGNOSIS — M2141 Flat foot [pes planus] (acquired), right foot: Secondary | ICD-10-CM

## 2022-01-18 DIAGNOSIS — M21371 Foot drop, right foot: Secondary | ICD-10-CM

## 2022-01-18 DIAGNOSIS — M2142 Flat foot [pes planus] (acquired), left foot: Secondary | ICD-10-CM

## 2022-01-18 DIAGNOSIS — B351 Tinea unguium: Secondary | ICD-10-CM | POA: Diagnosis not present

## 2022-01-18 DIAGNOSIS — M79674 Pain in right toe(s): Secondary | ICD-10-CM

## 2022-01-18 DIAGNOSIS — Z794 Long term (current) use of insulin: Secondary | ICD-10-CM

## 2022-01-22 NOTE — Progress Notes (Signed)
  Subjective:  Patient ID: Justin Fuse., male    DOB: 1960-12-03,  MRN: [id number]  Chief Complaint  Patient presents with   Nail Problem    New patient diabetic foot care, nail trim    61 y.o. male presents with the above complaint. History confirmed with patient.  He has a history of a stroke with right-sided weakness.  He wears a double upright brace on the right side and this is deteriorating he has had difficulty walking with this he is beginning to start physical therapy soon.  Objective:  Physical Exam: warm, good capillary refill, no trophic changes or ulcerative lesions, normal DP and PT pulses, and abnormal sensory exam with loss of protective sensation especially on the right side where his stroke deficit is, he has no strength 0 out of 5 in dorsiflexion plantarflexion on the right side compared to the left. Left Foot: dystrophic yellowed discolored nail plates with subungual debris Right Foot: dystrophic yellowed discolored nail plates with subungual debris  Assessment:   1. Pes planus of both feet   2. Pain due to onychomycosis of toenails of both feet   3. History of CVA with residual deficit   4. Foot drop, right   5. Type 2 diabetes mellitus with diabetic neuropathy, with long-term current use of insulin (HCC)      Plan:  Patient was evaluated and treated and all questions answered.  Patient educated on diabetes. Discussed proper diabetic foot care and discussed risks and complications of disease. Educated patient in depth on reasons to return to the office immediately should he/she discover anything concerning or new on the feet. All questions answered. Discussed proper shoes as well.  We will have him scheduled for fitting for diabetic shoes extra-depth multidensity insoles to protect his feet from ulceration  Discussed the etiology and treatment options for the condition in detail with the patient. Educated patient on the topical and oral treatment options  for mycotic nails. Recommended debridement of the nails today. Sharp and mechanical debridement performed of all painful and mycotic nails today. Nails debrided in length and thickness using a nail nipper to level of comfort. Discussed treatment options including appropriate shoe gear. Follow up as needed for painful nails.  For his dropfoot his double upright brace has been difficult to use and wear.  We discussed alternative bracing including a carbon fiber type prefabricated foot drop orthosis.  Rx for this was given to him for [facility], he will return to see me as needed.  Return in about 3 months (around 04/19/2022) for at risk diabetic foot care.

## 2022-02-04 ENCOUNTER — Emergency Department (HOSPITAL_COMMUNITY): Payer: Medicare HMO

## 2022-02-04 ENCOUNTER — Other Ambulatory Visit: Payer: Self-pay

## 2022-02-04 ENCOUNTER — Encounter (HOSPITAL_COMMUNITY): Payer: Self-pay | Admitting: Emergency Medicine

## 2022-02-04 ENCOUNTER — Emergency Department (HOSPITAL_COMMUNITY)
Admission: EM | Admit: 2022-02-04 | Discharge: 2022-02-04 | Disposition: A | Discharge status: Home / Self Care | Payer: Medicare HMO | Attending: Emergency Medicine | Admitting: Emergency Medicine

## 2022-02-04 DIAGNOSIS — Z794 Long term (current) use of insulin: Secondary | ICD-10-CM | POA: Insufficient documentation

## 2022-02-04 DIAGNOSIS — R051 Acute cough: Secondary | ICD-10-CM

## 2022-02-04 DIAGNOSIS — Z1152 Encounter for screening for COVID-19: Secondary | ICD-10-CM | POA: Diagnosis not present

## 2022-02-04 DIAGNOSIS — R0602 Shortness of breath: Secondary | ICD-10-CM | POA: Diagnosis not present

## 2022-02-04 DIAGNOSIS — B974 Respiratory syncytial virus as the cause of diseases classified elsewhere: Secondary | ICD-10-CM | POA: Insufficient documentation

## 2022-02-04 LAB — RESP PANEL BY RT-PCR (RSV, FLU A&B, COVID)  RVPGX2
Influenza A by PCR: NEGATIVE
Influenza B by PCR: NEGATIVE
Resp Syncytial Virus by PCR: POSITIVE — AB
SARS Coronavirus 2 by RT PCR: NEGATIVE

## 2022-02-04 MED ORDER — ALBUTEROL SULFATE HFA 108 (90 BASE) MCG/ACT IN AERS: 1.0000 | INHALATION_SPRAY | Freq: Four times a day (QID) | RESPIRATORY_TRACT | Status: DC | PRN

## 2022-02-04 MED ORDER — PREDNISONE 10 MG PO TABS: 20.0000 mg | ORAL_TABLET | Freq: Every day | ORAL | 0 refills | Status: DC

## 2022-02-04 MED ORDER — ALBUTEROL SULFATE HFA 108 (90 BASE) MCG/ACT IN AERS: 2.0000 | INHALATION_SPRAY | RESPIRATORY_TRACT | Status: DC | PRN

## 2022-02-04 MED ORDER — IPRATROPIUM-ALBUTEROL 0.5-2.5 (3) MG/3ML IN SOLN
3.0000 mL | Freq: Once | RESPIRATORY_TRACT | Status: AC
  Administered 2022-02-04: 3 mL via RESPIRATORY_TRACT
  Filled 2022-02-04: qty 3

## 2022-02-04 NOTE — Discharge Instructions (Addendum)
Return for any problem.   If you decide to use prednisone please monitor your sugars carefully and check them 3 times a day.  If you experience high sugars greater than 200 you should contact your PCP for assistance.

## 2022-02-04 NOTE — ED Provider Triage Note (Signed)
Emergency Medicine Provider Triage Evaluation Note  Sergio Zawislak. , a 61 y.o. male  was evaluated in triage.  Pt complains of sob. Increased cough, sob x 2 days.  Was given augmentin by PCP 3 days ago when he has congestion/cough.  Was prescribed preemptively without CXR.  Family concerns.   Review of Systems  Positive: As above Negative: As above  Physical Exam  There were no vitals taken for this visit. Gen:   Awake, no distress   Resp:  Normal effort  MSK:   Moves extremities without difficulty  Other:    Medical Decision Making  Medically screening exam initiated at 10:25 AM.  Appropriate orders placed.  Chazz Duanne Limerick. was informed that the remainder of the evaluation will be completed by another provider, this initial triage assessment does not replace that evaluation, and the importance of remaining in the ED until their evaluation is complete.     Fayrene Helper, PA-C 02/04/22 [phone]

## 2022-02-04 NOTE — ED Triage Notes (Signed)
Pt arrives via EMS from home with cough, SOB, seen by PCP on Friday given inhaler and amoxicillin. Today feeling worse, weaker than normal. Prev stroke with right sided deficits. Rhonchi in bases. EMS vitals, bp 162 palp, 94% RA, cbg 237, hr 86.No distress noted currently.

## 2022-02-04 NOTE — ED Provider Notes (Signed)
[facility] EMERGENCY DEPARTMENT Provider Note   CSN: [id number] Arrival date & time: 02/04/22  1019     History  Chief Complaint  Patient presents with   Cough   Shortness of Breath    Justin Molina. is a 61 y.o. male.  61 year old male with prior medical history detailed below presents for evaluation.  Patient complains of cough, mild shortness of breath.  Symptoms ongoing for the last 3 to 4 days.  Patient was prescribed Augmentin x 3 days.  He states that symptoms not improved significantly.  He denies fever or chest pain.  He reports use of albuterol at home with her MDI was an adequate and did not alleviate symptoms.  The history is provided by the patient and medical records.  Shortness of Breath      Home Medications Prior to Admission medications   Medication Sig Start Date End Date Taking? Authorizing Provider  acetaminophen (TYLENOL) 500 MG tablet Take 1 tablet (500 mg total) by mouth every 6 (six) hours as needed. 12/26/14   Glendell Docker, NP  amLODipine (NORVASC) 5 MG tablet Take 1 tablet (5 mg total) by mouth daily. 11/17/21   Nyoka Lint, PA-C  atorvastatin (LIPITOR) 80 MG tablet Take 1 tablet (80 mg total) by mouth daily. 11/17/21   Nyoka Lint, PA-C  bisacodyl (DULCOLAX) 5 MG EC tablet Take 1 tablet (5 mg total) by mouth 2 (two) times daily. 11/17/21   Nyoka Lint, PA-C  blood glucose meter kit and supplies KIT Dispense based on patient and insurance preference. Use up to four times daily as directed. 11/17/21   Nyoka Lint, PA-C  Blood Glucose Monitoring Suppl (ACCU-CHEK AVIVA PLUS) W/DEVICE KIT Check blood sugar TID  & QHS 11/17/14   Lance Bosch, NP  Blood Pressure Monitoring (BLOOD PRESSURE CUFF) MISC 1 Units by Does not apply route daily. 11/17/21   Nyoka Lint, PA-C  carvedilol (COREG) 25 MG tablet Take 1 tablet (25 mg total) by mouth 2 (two) times daily with a meal. Patient needs office visit for more refills 11/17/21    Nyoka Lint, PA-C  doxazosin (CARDURA) 8 MG tablet Take 1 tablet (8 mg total) by mouth daily. 11/17/21   Nyoka Lint, PA-C  ELIQUIS 5 MG TABS tablet Take 1 tablet (5 mg total) by mouth 2 (two) times daily. 11/17/21   Nyoka Lint, PA-C  glucose blood (ACCU-CHEK AVIVA PLUS) test strip Use as instructed for TID and QHS blood glucose testing 05/23/15   Tresa Garter, MD  hydrALAZINE (APRESOLINE) 100 MG tablet Take 1 tablet (100 mg total) by mouth 3 (three) times daily. 11/17/21   Nyoka Lint, PA-C  insulin detemir (LEVEMIR) 100 UNIT/ML injection Inject 6 Units into the skin at bedtime.    [provider]  isosorbide mononitrate (IMDUR) 120 MG 24 hr tablet Take 1 tablet (120 mg total) by mouth daily. 11/17/21   Nyoka Lint, PA-C  Lancets 28G MISC Check blood sugar TID & QHS 05/23/15   Tresa Garter, MD  loperamide (IMODIUM) 2 MG capsule Take 1 capsule (2 mg total) by mouth 4 (four) times daily as needed for diarrhea or loose stools. 01/06/22   Tedd Sias, PA  Misc. Devices (BATHTUB SAFETY RAIL) MISC Patient needs safety rails for shower 01/04/15   Chari Manning A, NP  MULTAQ 400 MG tablet Take 1 tablet (400 mg total) by mouth 2 (two) times daily. 11/17/21   Nyoka Lint, PA-C  ondansetron (ZOFRAN-ODT) 4 MG disintegrating  tablet Take 1 tablet (4 mg total) by mouth every 8 (eight) hours as needed for nausea or vomiting. 01/06/22   Tedd Sias, PA  potassium chloride (KLOR-CON M) 10 MEQ tablet Take 1 tablet (10 mEq total) by mouth daily. 11/17/21   Nyoka Lint, PA-C  traMADol (ULTRAM) 50 MG tablet Take 50 mg by mouth every 6 (six) hours as needed for moderate pain or severe pain.    [provider]      Allergies    Contrast media [iodinated contrast media]    Review of Systems   Review of Systems  All other systems reviewed and are negative.   Physical Exam Updated Vital Signs BP (!) 160/118   Pulse 84   Temp 99.7 F (37.6 C) (Oral)   Resp 14    SpO2 95%  Physical Exam Vitals and nursing note reviewed.  Constitutional:      General: He is not in acute distress.    Appearance: Normal appearance. He is well-developed.  HENT:     Head: Normocephalic and atraumatic.  Eyes:     Conjunctiva/sclera: Conjunctivae normal.     Pupils: Pupils are equal, round, and reactive to light.  Cardiovascular:     Rate and Rhythm: Normal rate and regular rhythm.     Heart sounds: Normal heart sounds.  Pulmonary:     Effort: Pulmonary effort is normal. No respiratory distress.     Breath sounds: Normal breath sounds.  Abdominal:     General: There is no distension.     Palpations: Abdomen is soft.     Tenderness: There is no abdominal tenderness.  Musculoskeletal:        General: No deformity. Normal range of motion.     Cervical back: Normal range of motion and neck supple.  Skin:    General: Skin is warm and dry.  Neurological:     General: No focal deficit present.     Mental Status: He is alert and oriented to person, place, and time.     ED Results / Procedures / Treatments   Labs (all labs ordered are listed, but only abnormal results are displayed) Labs Reviewed  RESP PANEL BY RT-PCR (RSV, FLU A&B, COVID)  RVPGX2 - Abnormal; Notable for the following components:      Result Value   Resp Syncytial Virus by PCR POSITIVE (*)    All other components within normal limits    EKG None  Radiology DG Chest 2 View  Result Date: 02/04/2022 CLINICAL DATA:  Shortness of breath EXAM: CHEST - 2 VIEW COMPARISON:  03/18/2015 FINDINGS: Cardiomegaly. Pulmonary vascular congestion. No focal airspace consolidation, pleural effusion, or pneumothorax. IMPRESSION: Cardiomegaly with pulmonary vascular congestion. Electronically Signed   By: Davina Poke D.O.   On: 02/04/2022 12:21    Procedures Procedures    Medications Ordered in ED Medications  ipratropium-albuterol (DUONEB) 0.5-2.5 (3) MG/3ML nebulizer solution 3 mL (has no  administration in time range)    ED Course/ Medical Decision Making/ A&P                           Medical Decision Making Amount and/or Complexity of Data Reviewed Radiology: ordered.  Risk Prescription drug management.    Medical Screen Complete  This patient presented to the ED with complaint of cough, wheezing.  This complaint involves an extensive number of treatment options. The initial differential diagnosis includes, but is not limited to, bacterial versus viral  infection, URI, pneumonia, etc.  This presentation is: Acute, Chronic, Self-Limited, Previously Undiagnosed, Uncertain Prognosis, Complicated, Systemic Symptoms, and Threat to Life/Bodily Function  Patient is presenting with complaint of cough, shortness of breath, wheezing.  Symptoms not alleviated by use of Augmentin at home x 3 days.  Patient is RSV positive.  Presentation is consistent with viral URI with resultant mild bronchospasm.  Patient is appropriate for discharge home.  Importance of close follow-up is stressed.  Strict return precautions given and understood.     Additional history obtained: External records from outside sources obtained and reviewed including prior ED visits and prior Inpatient records.    Lab Tests:  I ordered and personally interpreted labs.  The pertinent results include: COVID, flu, RSV   Imaging Studies ordered:  I ordered imaging studies including CXR  I independently visualized and interpreted obtained imaging which showed NAD I agree with the radiologist interpretation.   Cardiac Monitoring:  The patient was maintained on a cardiac monitor.  I personally viewed and interpreted the cardiac monitor which showed an underlying rhythm of: NSR   Medicines ordered:  I ordered medication including duoneb  for bronchospasm  Reevaluation of the patient after these medicines showed that the patient: improved   Problem List / ED Course:  Cough     Reevaluation:  After the interventions noted above, I reevaluated the patient and found that they have: improved   Disposition:  After consideration of the diagnostic results and the patients response to treatment, I feel that the patent would benefit from close outpatient followup.          Final Clinical Impression(s) / ED Diagnoses Final diagnoses:  Acute cough    Rx / DC Orders ED Discharge Orders          Ordered    predniSONE (DELTASONE) 10 MG tablet  Daily        02/04/22 1500              Valarie Merino, MD 02/04/22 1517

## 2022-02-07 NOTE — Addendum Note (Signed)
Addended bySherryle Lis, Jathniel Smeltzer R on: 02/07/2022 09:35 AM   Modules accepted: Orders

## 2022-02-08 ENCOUNTER — Ambulatory Visit: Payer: Medicare HMO

## 2022-02-16 ENCOUNTER — Ambulatory Visit (INDEPENDENT_AMBULATORY_CARE_PROVIDER_SITE_OTHER): Payer: Medicare HMO

## 2022-02-16 DIAGNOSIS — M21371 Foot drop, right foot: Secondary | ICD-10-CM

## 2022-02-16 DIAGNOSIS — Z794 Long term (current) use of insulin: Secondary | ICD-10-CM

## 2022-02-16 DIAGNOSIS — E1140 Type 2 diabetes mellitus with diabetic neuropathy, unspecified: Secondary | ICD-10-CM

## 2022-02-16 DIAGNOSIS — M2142 Flat foot [pes planus] (acquired), left foot: Secondary | ICD-10-CM

## 2022-02-16 DIAGNOSIS — M2141 Flat foot [pes planus] (acquired), right foot: Secondary | ICD-10-CM

## 2022-02-16 NOTE — Progress Notes (Signed)
Patient presents to the office today for diabetic shoe and insole measuring.  Patient was measured with brannock device to determine size and width for 1 pair of extra depth shoes and foam casted for 3 pair of insoles.   ABN signed.   Documentation of medical necessity will be sent to patient's treating diabetic doctor to verify and sign.   Patient's diabetic provider: Lanae Boast, DO [id number]  Shoes and insoles will be ordered at that time and patient will be notified for an appointment for fitting when they arrive.   Brannock measurement: 11.5 xw  Patient shoe selection-   1st   Shoe choice:   [id number] APEX   Shoe size ordered: 13WX per pt request

## 2022-03-14 ENCOUNTER — Telehealth: Payer: Self-pay | Admitting: Podiatry

## 2022-03-14 NOTE — Telephone Encounter (Signed)
Lmom to call back to schedule picking up diabetic shoes  

## 2022-03-16 ENCOUNTER — Ambulatory Visit (INDEPENDENT_AMBULATORY_CARE_PROVIDER_SITE_OTHER): Payer: Medicare HMO

## 2022-03-16 DIAGNOSIS — Z794 Long term (current) use of insulin: Secondary | ICD-10-CM

## 2022-03-16 DIAGNOSIS — E1140 Type 2 diabetes mellitus with diabetic neuropathy, unspecified: Secondary | ICD-10-CM | POA: Diagnosis not present

## 2022-03-16 DIAGNOSIS — M2142 Flat foot [pes planus] (acquired), left foot: Secondary | ICD-10-CM | POA: Diagnosis not present

## 2022-03-16 DIAGNOSIS — M2141 Flat foot [pes planus] (acquired), right foot: Secondary | ICD-10-CM

## 2022-03-16 NOTE — Progress Notes (Signed)
Patient presents today to pick up diabetic shoes and insoles.  Patient was dispensed 1 pair of diabetic shoes and 3 pairs of foam casted diabetic insoles.   He tried on the shoes with the insoles and the fit was satisfactory.   Will follow up next year for new order.

## 2022-04-11 ENCOUNTER — Ambulatory Visit (INDEPENDENT_AMBULATORY_CARE_PROVIDER_SITE_OTHER): Payer: Medicare HMO | Admitting: Podiatry

## 2022-04-11 DIAGNOSIS — Z794 Long term (current) use of insulin: Secondary | ICD-10-CM

## 2022-04-11 DIAGNOSIS — E1140 Type 2 diabetes mellitus with diabetic neuropathy, unspecified: Secondary | ICD-10-CM

## 2022-04-23 ENCOUNTER — Ambulatory Visit: Payer: Medicare HMO | Admitting: Podiatry

## 2022-04-24 ENCOUNTER — Telehealth: Payer: Self-pay | Admitting: Podiatry

## 2022-04-24 NOTE — Telephone Encounter (Signed)
Called patient he said to call his wife , no answer at her number left message on vm for her to call back to pick time to try on diabetic shoes

## 2022-04-30 ENCOUNTER — Other Ambulatory Visit: Payer: Medicare HMO

## 2022-05-21 ENCOUNTER — Encounter: Payer: Self-pay | Admitting: Podiatry

## 2022-05-21 ENCOUNTER — Ambulatory Visit (INDEPENDENT_AMBULATORY_CARE_PROVIDER_SITE_OTHER): Payer: Medicare HMO | Admitting: Podiatry

## 2022-05-21 DIAGNOSIS — Z794 Long term (current) use of insulin: Secondary | ICD-10-CM | POA: Diagnosis not present

## 2022-05-21 DIAGNOSIS — M21371 Foot drop, right foot: Secondary | ICD-10-CM

## 2022-05-21 DIAGNOSIS — E1140 Type 2 diabetes mellitus with diabetic neuropathy, unspecified: Secondary | ICD-10-CM | POA: Diagnosis not present

## 2022-05-21 DIAGNOSIS — M2142 Flat foot [pes planus] (acquired), left foot: Secondary | ICD-10-CM

## 2022-05-21 DIAGNOSIS — M2141 Flat foot [pes planus] (acquired), right foot: Secondary | ICD-10-CM

## 2022-05-21 NOTE — Progress Notes (Addendum)
Patient presents today to pick up diabetic shoes and insoles.  Patient was dispensed 1 pair of diabetic shoes and 3 pairs of foam casted diabetic insoles.   He tried on the shoes with the insoles and the fit was satisfactory.   Will follow up next year for new order.    Helane Gunther DPM

## 2022-05-22 ENCOUNTER — Ambulatory Visit (INDEPENDENT_AMBULATORY_CARE_PROVIDER_SITE_OTHER): Payer: Medicare HMO | Admitting: Podiatry

## 2022-05-22 ENCOUNTER — Encounter: Payer: Self-pay | Admitting: Podiatry

## 2022-05-22 DIAGNOSIS — B351 Tinea unguium: Secondary | ICD-10-CM | POA: Diagnosis not present

## 2022-05-22 DIAGNOSIS — Z794 Long term (current) use of insulin: Secondary | ICD-10-CM

## 2022-05-22 DIAGNOSIS — E1140 Type 2 diabetes mellitus with diabetic neuropathy, unspecified: Secondary | ICD-10-CM

## 2022-05-22 DIAGNOSIS — M79675 Pain in left toe(s): Secondary | ICD-10-CM

## 2022-05-22 DIAGNOSIS — M79674 Pain in right toe(s): Secondary | ICD-10-CM

## 2022-05-22 NOTE — Progress Notes (Signed)
This patient returns to my office for at risk foot care.  This patient requires this care by a professional since this patient will be at risk due to having diabetes.He presents to the office with his wife of 38 years.  This patient is unable to cut nails himself since the patient cannot reach his nails.These nails are painful walking and wearing shoes.  This patient presents for at risk foot care today.  General Appearance  Alert, conversant and in no acute stress.  Vascular  Dorsalis pedis and posterior tibial  pulses are palpable  bilaterally.  Capillary return is within normal limits  bilaterally. Temperature is within normal limits  bilaterally.  Neurologic  Senn-Weinstein monofilament wire test  diminished bilaterally. Muscle power within normal limits bilaterally.  Nails Thick disfigured discolored nails with subungual debris  from hallux to fifth toes bilaterally. No evidence of bacterial infection or drainage bilaterally.  Orthopedic  No limitations of motion  feet .  No crepitus or effusions noted.  No bony pathology or digital deformities noted.  Skin  normotropic skin with no porokeratosis noted bilaterally.  No signs of infections or ulcers noted.     Onychomycosis  Pain in right toes  Pain in left toes  Consent was obtained for treatment procedures.   Mechanical debridement of nails 1-5  bilaterally performed with a nail nipper.  Filed with dremel without incident.    Return office visit     3 months                 Told patient to return for periodic foot care and evaluation due to potential at risk complications.   Helane Gunther DPM

## 2022-06-12 ENCOUNTER — Telehealth: Payer: Self-pay | Admitting: Podiatry

## 2022-06-12 NOTE — Telephone Encounter (Signed)
Justin Molina called she said she needs an order faxed over to [facility] for her husband to get a custom brace to be fitted to his new diabetic shoes.  Please call

## 2022-06-15 ENCOUNTER — Telehealth: Payer: Self-pay | Admitting: Podiatry

## 2022-06-15 DIAGNOSIS — M21371 Foot drop, right foot: Secondary | ICD-10-CM

## 2022-06-15 NOTE — Telephone Encounter (Signed)
Pts wife called and has called multiple times tring to get a rx for a brace to take to [facility]. Pt has now gotten his diabetic shoes so he can get fitted for a drop foot brace now.. Please advise when rx is filled out and I can fax to [facility] and I told pts wife I would call and let her know as well.

## 2022-06-20 NOTE — Telephone Encounter (Signed)
Called pts wife to let her know I was faxing the orders to [facility] and she said ok and thank you.Marland Kitchen

## 2022-07-10 ENCOUNTER — Telehealth: Payer: Self-pay | Admitting: Podiatry

## 2022-07-10 NOTE — Telephone Encounter (Signed)
Patients wife called today, the diabetics shoes he picked are too tight and she doesn't know what to do? Patient wore the shoes to [place] I explained to her the return policy that once they have been worn outside we can't return them.  She wants to know that if she goes to [facility] can they get her a pair of shoes that the brace will fit in? What do you suggest?

## 2022-08-21 ENCOUNTER — Ambulatory Visit: Payer: Medicare HMO | Admitting: Podiatry

## 2022-09-30 ENCOUNTER — Other Ambulatory Visit: Payer: Self-pay

## 2022-09-30 ENCOUNTER — Encounter (HOSPITAL_BASED_OUTPATIENT_CLINIC_OR_DEPARTMENT_OTHER): Payer: Self-pay | Admitting: Emergency Medicine

## 2022-09-30 ENCOUNTER — Emergency Department (HOSPITAL_BASED_OUTPATIENT_CLINIC_OR_DEPARTMENT_OTHER)
Admission: EM | Admit: 2022-09-30 | Discharge: 2022-09-30 | Disposition: A | Discharge status: Home / Self Care | Payer: Medicare PPO | Attending: Emergency Medicine | Admitting: Emergency Medicine

## 2022-09-30 DIAGNOSIS — Z79899 Other long term (current) drug therapy: Secondary | ICD-10-CM | POA: Diagnosis not present

## 2022-09-30 DIAGNOSIS — R739 Hyperglycemia, unspecified: Secondary | ICD-10-CM | POA: Diagnosis present

## 2022-09-30 DIAGNOSIS — Z794 Long term (current) use of insulin: Secondary | ICD-10-CM | POA: Diagnosis not present

## 2022-09-30 DIAGNOSIS — E860 Dehydration: Secondary | ICD-10-CM | POA: Diagnosis not present

## 2022-09-30 DIAGNOSIS — E1165 Type 2 diabetes mellitus with hyperglycemia: Secondary | ICD-10-CM | POA: Diagnosis not present

## 2022-09-30 DIAGNOSIS — Z7901 Long term (current) use of anticoagulants: Secondary | ICD-10-CM | POA: Insufficient documentation

## 2022-09-30 LAB — CBC
HCT: 40.5 % (ref 39.0–52.0)
Hemoglobin: 13.8 g/dL (ref 13.0–17.0)
MCH: 27.3 pg (ref 26.0–34.0)
MCHC: 34.1 g/dL (ref 30.0–36.0)
MCV: 80.2 fL (ref 80.0–100.0)
Platelets: 161 10*3/uL (ref 150–400)
RBC: 5.05 MIL/uL (ref 4.22–5.81)
RDW: 13.9 % (ref 11.5–15.5)
WBC: 5.7 10*3/uL (ref 4.0–10.5)
nRBC: 0 % (ref 0.0–0.2)

## 2022-09-30 LAB — BASIC METABOLIC PANEL
Anion gap: 9 (ref 5–15)
BUN: 30 mg/dL — ABNORMAL HIGH (ref 8–23)
CO2: 26 mmol/L (ref 22–32)
Calcium: 8.8 mg/dL — ABNORMAL LOW (ref 8.9–10.3)
Chloride: 101 mmol/L (ref 98–111)
Creatinine, Ser: 2.12 mg/dL — ABNORMAL HIGH (ref 0.61–1.24)
GFR, Estimated: 35 mL/min — ABNORMAL LOW (ref >60)
Glucose, Bld: 587 mg/dL (ref 70–99)
Potassium: 3.5 mmol/L (ref 3.5–5.1)
Sodium: 136 mmol/L (ref 135–145)

## 2022-09-30 LAB — URINALYSIS, ROUTINE W REFLEX MICROSCOPIC
Bilirubin Urine: NEGATIVE
Glucose, UA: >=500 mg/dL — AB
Ketones, ur: NEGATIVE mg/dL
Leukocytes,Ua: NEGATIVE
Nitrite: NEGATIVE
Protein, ur: 100 mg/dL — AB
Specific Gravity, Urine: 1.01 (ref 1.005–1.030)
pH: 5.5 (ref 5.0–8.0)

## 2022-09-30 LAB — I-STAT VENOUS BLOOD GAS, ED
Acid-Base Excess: 2 mmol/L (ref 0.0–2.0)
Bicarbonate: 27.7 mmol/L (ref 20.0–28.0)
Calcium, Ion: 1.25 mmol/L (ref 1.15–1.40)
HCT: 41 % (ref 39.0–52.0)
Hemoglobin: 13.9 g/dL (ref 13.0–17.0)
O2 Saturation: 80 %
Patient temperature: 98.3
Potassium: 3.6 mmol/L (ref 3.5–5.1)
Sodium: 140 mmol/L (ref 135–145)
TCO2: 29 mmol/L (ref 22–32)
pCO2, Ven: 46.1 mmHg (ref 44–60)
pH, Ven: 7.387 (ref 7.25–7.43)
pO2, Ven: 45 mmHg (ref 32–45)

## 2022-09-30 LAB — URINALYSIS, MICROSCOPIC (REFLEX)

## 2022-09-30 LAB — CBG MONITORING, ED
Glucose-Capillary: 557 mg/dL (ref 70–99)
Glucose-Capillary: 394 mg/dL — ABNORMAL HIGH (ref 70–99)

## 2022-09-30 MED ORDER — LACTATED RINGERS IV BOLUS
1000.0000 mL | Freq: Once | INTRAVENOUS | Status: AC
  Administered 2022-09-30: 1000 mL via INTRAVENOUS

## 2022-09-30 NOTE — ED Triage Notes (Signed)
Patient arrived via POV c/o hyperglycemia x 4 days. Patient family member states at home CGB of greater than 500. Patient has hx of diabetes and stroke, just recently getting over viral illness. Patient is AO x 4, VS w/ elevated BP, unable to walk.

## 2022-09-30 NOTE — Discharge Instructions (Addendum)
Call your doctor in the morning to discuss a potential change in your diabetic regimen.  Be sure to eat and drink appropriately with a diabetic diet.  If you develop confusion, weakness, vomiting, or any other new/concerning symptoms then return to the ER or call 911.

## 2022-09-30 NOTE — ED Provider Notes (Signed)
[place] [facility] Provider Note   CSN: [id number] Arrival date & time: 09/30/22  2008     History  Chief Complaint  Patient presents with   Hyperglycemia    Justin Molina. is a 62 y.o. male.  HPI 62 year old male presents with hyperglycemia.  History is primarily from wife.  He had a viral illness about 12 days ago which she is now over.  Since then over the past week or so he has been having glucoses over 500 every day.  He is chronically on Lantus and a month or 2 ago it was increased from 5 units at night up to 15 units at night.  However according to patient and family he is not on anything else for diabetes.  When he was having the viral illness he was drinking orange juice which does not typically as well as some other treatments which may have driven up his sugar.  He otherwise is feeling fine including no vomiting, fever, abdominal pain, chest pain, etc.  He has been thirstier than normal.  Home Medications Prior to Admission medications   Medication Sig Start Date End Date Taking? Authorizing Provider  acetaminophen (TYLENOL) 500 MG tablet Take 1 tablet (500 mg total) by mouth every 6 (six) hours as needed. 12/26/14   Teressa Lower, NP  amLODipine (NORVASC) 5 MG tablet Take 1 tablet (5 mg total) by mouth daily. 11/17/21   Ellsworth Lennox, PA-C  atorvastatin (LIPITOR) 80 MG tablet Take 1 tablet (80 mg total) by mouth daily. 11/17/21   Ellsworth Lennox, PA-C  bisacodyl (DULCOLAX) 5 MG EC tablet Take 1 tablet (5 mg total) by mouth 2 (two) times daily. 11/17/21   Ellsworth Lennox, PA-C  blood glucose meter kit and supplies KIT Dispense based on patient and insurance preference. Use up to four times daily as directed. 11/17/21   Ellsworth Lennox, PA-C  Blood Glucose Monitoring Suppl (ACCU-CHEK AVIVA PLUS) W/DEVICE KIT Check blood sugar TID  & QHS 11/17/14   Ambrose Finland, NP  Blood Pressure Monitoring (BLOOD PRESSURE CUFF) MISC 1 Units by Does not  apply route daily. 11/17/21   Ellsworth Lennox, PA-C  carvedilol (COREG) 25 MG tablet Take 1 tablet (25 mg total) by mouth 2 (two) times daily with a meal. Patient needs office visit for more refills 11/17/21   Ellsworth Lennox, PA-C  doxazosin (CARDURA) 8 MG tablet Take 1 tablet (8 mg total) by mouth daily. 11/17/21   Ellsworth Lennox, PA-C  ELIQUIS 5 MG TABS tablet Take 1 tablet (5 mg total) by mouth 2 (two) times daily. 11/17/21   Ellsworth Lennox, PA-C  glucose blood (ACCU-CHEK AVIVA PLUS) test strip Use as instructed for TID and QHS blood glucose testing 05/23/15   Quentin Angst, MD  hydrALAZINE (APRESOLINE) 100 MG tablet Take 1 tablet (100 mg total) by mouth 3 (three) times daily. 11/17/21   Ellsworth Lennox, PA-C  insulin detemir (LEVEMIR) 100 UNIT/ML injection Inject 6 Units into the skin at bedtime.    [provider]  isosorbide mononitrate (IMDUR) 120 MG 24 hr tablet Take 1 tablet (120 mg total) by mouth daily. 11/17/21   Ellsworth Lennox, PA-C  Lancets 28G MISC Check blood sugar TID & QHS 05/23/15   Quentin Angst, MD  loperamide (IMODIUM) 2 MG capsule Take 1 capsule (2 mg total) by mouth 4 (four) times daily as needed for diarrhea or loose stools. 01/06/22   Gailen Shelter, PA  Misc. Devices (BATHTUB SAFETY  RAIL) MISC Patient needs safety rails for shower 01/04/15   Holland Commons A, NP  MULTAQ 400 MG tablet Take 1 tablet (400 mg total) by mouth 2 (two) times daily. 11/17/21   Ellsworth Lennox, PA-C  ondansetron (ZOFRAN-ODT) 4 MG disintegrating tablet Take 1 tablet (4 mg total) by mouth every 8 (eight) hours as needed for nausea or vomiting. 01/06/22   Gailen Shelter, PA  potassium chloride (KLOR-CON M) 10 MEQ tablet Take 1 tablet (10 mEq total) by mouth daily. 11/17/21   Ellsworth Lennox, PA-C  predniSONE (DELTASONE) 10 MG tablet Take 2 tablets (20 mg total) by mouth daily. 02/04/22   Wynetta Fines, MD  traMADol (ULTRAM) 50 MG tablet Take 50 mg by mouth every 6 (six) hours as needed for moderate  pain or severe pain.    [provider]      Allergies    Contrast media [iodinated contrast media]    Review of Systems   Review of Systems  Constitutional:  Negative for fever.  Cardiovascular:  Negative for chest pain.  Gastrointestinal:  Negative for abdominal pain and vomiting.  Genitourinary:  Negative for dysuria.  Neurological:  Negative for weakness and light-headedness.    Physical Exam Updated Vital Signs BP (!) 143/105 (BP Location: Left Arm)   Pulse (!) 101   Temp 98.4 F (36.9 C)   Resp 18   Ht 5\' 11"  (1.803 m)   Wt 128.4 kg   SpO2 97%   BMI 39.47 kg/m  Physical Exam Vitals and nursing note reviewed.  Constitutional:      General: He is not in acute distress.    Appearance: He is well-developed. He is not ill-appearing or diaphoretic.  HENT:     Head: Normocephalic and atraumatic.  Cardiovascular:     Rate and Rhythm: Normal rate and regular rhythm.     Heart sounds: Normal heart sounds.  Pulmonary:     Effort: Pulmonary effort is normal.     Breath sounds: Normal breath sounds.  Abdominal:     Palpations: Abdomen is soft.     Tenderness: There is no abdominal tenderness.  Skin:    General: Skin is warm and dry.  Neurological:     Mental Status: He is alert.     ED Results / Procedures / Treatments   Labs (all labs ordered are listed, but only abnormal results are displayed) Labs Reviewed  BASIC METABOLIC PANEL - Abnormal; Notable for the following components:      Result Value   Glucose, Bld 587 (*)    BUN 30 (*)    Creatinine, Ser 2.12 (*)    Calcium 8.8 (*)    GFR, Estimated 35 (*)    All other components within normal limits  URINALYSIS, ROUTINE W REFLEX MICROSCOPIC - Abnormal; Notable for the following components:   Glucose, UA >=500 (*)    Hgb urine dipstick MODERATE (*)    Protein, ur 100 (*)    All other components within normal limits  URINALYSIS, MICROSCOPIC (REFLEX) - Abnormal; Notable for the following components:    Bacteria, UA RARE (*)    All other components within normal limits  CBG MONITORING, ED - Abnormal; Notable for the following components:   Glucose-Capillary 557 (*)    All other components within normal limits  CBC  I-STAT VENOUS BLOOD GAS, ED  CBG MONITORING, ED    EKG None  Radiology No results found.  Procedures Procedures    Medications Ordered in ED  Medications  lactated ringers bolus 1,000 mL (1,000 mLs Intravenous New Bag/Given 09/30/22 2149)  lactated ringers bolus 1,000 mL (1,000 mLs Intravenous New Bag/Given 09/30/22 2154)    ED Course/ Medical Decision Making/ A&P                                 Medical Decision Making Amount and/or Complexity of Data Reviewed Labs: ordered.    Details: Hyperglycemia but no DKA   Patient presents with asymptomatic hyperglycemia.  Could be related to his recent illness versus overall poor diabetic control.  There is no evidence of DKA.  Given he is asymptomatic I do not think an extensive workup is needed with no anion gap acidosis.  No evidence of infection at this time.  No ketones in the urine.  He was given fluids.  His creatinine is similar to when he was in the ER in December.  Perhaps there is a mild dehydration component but he has been given a couple bags of fluids and is feeling well and appears stable for discharge home to follow-up with his PCP.  I discussed he should continue to use his Lantus as prescribed but call his PCP tomorrow for further outpatient treatment and medication adjustments.        Final Clinical Impression(s) / ED Diagnoses Final diagnoses:  Hyperglycemia    Rx / DC Orders ED Discharge Orders     None         Pricilla Loveless, MD 09/30/22 2314

## 2022-10-11 ENCOUNTER — Encounter: Payer: Self-pay | Admitting: Podiatry

## 2022-10-11 ENCOUNTER — Ambulatory Visit (INDEPENDENT_AMBULATORY_CARE_PROVIDER_SITE_OTHER): Payer: Medicare PPO | Admitting: Podiatry

## 2022-10-11 DIAGNOSIS — E1140 Type 2 diabetes mellitus with diabetic neuropathy, unspecified: Secondary | ICD-10-CM | POA: Diagnosis not present

## 2022-10-11 DIAGNOSIS — Z794 Long term (current) use of insulin: Secondary | ICD-10-CM | POA: Diagnosis not present

## 2022-10-11 DIAGNOSIS — M79675 Pain in left toe(s): Secondary | ICD-10-CM

## 2022-10-11 DIAGNOSIS — B351 Tinea unguium: Secondary | ICD-10-CM

## 2022-10-11 DIAGNOSIS — M21371 Foot drop, right foot: Secondary | ICD-10-CM

## 2022-10-11 DIAGNOSIS — M79674 Pain in right toe(s): Secondary | ICD-10-CM

## 2022-10-11 NOTE — Progress Notes (Signed)
This patient returns to my office for at risk foot care.  This patient requires this care by a professional since this patient will be at risk due to having diabetes.He presents to the office with his wife of 38 years.  This patient is unable to cut nails himself since the patient cannot reach his nails.These nails are painful walking and wearing shoes.  This patient presents for at risk foot care today.  General Appearance  Alert, conversant and in no acute stress.  Vascular  Dorsalis pedis and posterior tibial  pulses are palpable  bilaterally.  Capillary return is within normal limits  bilaterally. Temperature is within normal limits  bilaterally.  Neurologic  Senn-Weinstein monofilament wire test  diminished bilaterally. Muscle power within normal limits bilaterally.  Nails Thick disfigured discolored nails with subungual debris  from hallux to fifth toes bilaterally. No evidence of bacterial infection or drainage bilaterally.  Orthopedic  No limitations of motion  feet .  No crepitus or effusions noted.  No bony pathology or digital deformities noted. Foot drop.  Skin  normotropic skin with no porokeratosis noted bilaterally.  No signs of infections or ulcers noted.     Onychomycosis  Pain in right toes  Pain in left toes  Foot drop.  Consent was obtained for treatment procedures.   Mechanical debridement of nails 1-5  bilaterally performed with a nail nipper.  Filed with dremel without incident. Patient requests and has been given a prescription of diabetic shoes with AFO   Return office visit     3 months                 Told patient to return for periodic foot care and evaluation due to potential at risk complications.   Helane Gunther DPM

## 2022-10-23 ENCOUNTER — Emergency Department (HOSPITAL_BASED_OUTPATIENT_CLINIC_OR_DEPARTMENT_OTHER)
Admission: EM | Admit: 2022-10-23 | Discharge: 2022-10-23 | Disposition: A | Discharge status: Home / Self Care | Payer: Medicare PPO | Attending: Emergency Medicine | Admitting: Emergency Medicine

## 2022-10-23 ENCOUNTER — Encounter (HOSPITAL_BASED_OUTPATIENT_CLINIC_OR_DEPARTMENT_OTHER): Payer: Self-pay | Admitting: Emergency Medicine

## 2022-10-23 DIAGNOSIS — Z7901 Long term (current) use of anticoagulants: Secondary | ICD-10-CM | POA: Insufficient documentation

## 2022-10-23 DIAGNOSIS — E1165 Type 2 diabetes mellitus with hyperglycemia: Secondary | ICD-10-CM | POA: Insufficient documentation

## 2022-10-23 DIAGNOSIS — R739 Hyperglycemia, unspecified: Secondary | ICD-10-CM | POA: Diagnosis present

## 2022-10-23 DIAGNOSIS — Z794 Long term (current) use of insulin: Secondary | ICD-10-CM | POA: Insufficient documentation

## 2022-10-23 DIAGNOSIS — E119 Type 2 diabetes mellitus without complications: Secondary | ICD-10-CM

## 2022-10-23 LAB — CBC WITH DIFFERENTIAL/PLATELET
Abs Immature Granulocytes: 0.01 10*3/uL (ref 0.00–0.07)
Basophils Absolute: 0 10*3/uL (ref 0.0–0.1)
Basophils Relative: 0 %
Eosinophils Absolute: 0.1 10*3/uL (ref 0.0–0.5)
Eosinophils Relative: 2 %
HCT: 39.9 % (ref 39.0–52.0)
Hemoglobin: 13.6 g/dL (ref 13.0–17.0)
Immature Granulocytes: 0 %
Lymphocytes Relative: 33 %
Lymphs Abs: 1.6 10*3/uL (ref 0.7–4.0)
MCH: 27.2 pg (ref 26.0–34.0)
MCHC: 34.1 g/dL (ref 30.0–36.0)
MCV: 79.8 fL — ABNORMAL LOW (ref 80.0–100.0)
Monocytes Absolute: 0.4 10*3/uL (ref 0.1–1.0)
Monocytes Relative: 9 %
Neutro Abs: 2.7 10*3/uL (ref 1.7–7.7)
Neutrophils Relative %: 56 %
Platelets: 128 10*3/uL — ABNORMAL LOW (ref 150–400)
RBC: 5 MIL/uL (ref 4.22–5.81)
RDW: 14 % (ref 11.5–15.5)
WBC: 4.8 10*3/uL (ref 4.0–10.5)
nRBC: 0 % (ref 0.0–0.2)

## 2022-10-23 LAB — BASIC METABOLIC PANEL
Anion gap: 9 (ref 5–15)
BUN: 21 mg/dL (ref 8–23)
CO2: 27 mmol/L (ref 22–32)
Calcium: 8.5 mg/dL — ABNORMAL LOW (ref 8.9–10.3)
Chloride: 105 mmol/L (ref 98–111)
Creatinine, Ser: 1.64 mg/dL — ABNORMAL HIGH (ref 0.61–1.24)
GFR, Estimated: 47 mL/min — ABNORMAL LOW (ref >60)
Glucose, Bld: 367 mg/dL — ABNORMAL HIGH (ref 70–99)
Potassium: 3.4 mmol/L — ABNORMAL LOW (ref 3.5–5.1)
Sodium: 141 mmol/L (ref 135–145)

## 2022-10-23 LAB — CBG MONITORING, ED
Glucose-Capillary: 411 mg/dL — ABNORMAL HIGH (ref 70–99)
Glucose-Capillary: 262 mg/dL — ABNORMAL HIGH (ref 70–99)

## 2022-10-23 LAB — I-STAT VENOUS BLOOD GAS, ED
Acid-Base Excess: 3 mmol/L — ABNORMAL HIGH (ref 0.0–2.0)
Bicarbonate: 29.3 mmol/L — ABNORMAL HIGH (ref 20.0–28.0)
Calcium, Ion: 1.19 mmol/L (ref 1.15–1.40)
HCT: 41 % (ref 39.0–52.0)
Hemoglobin: 13.9 g/dL (ref 13.0–17.0)
O2 Saturation: 79 %
Potassium: 3.6 mmol/L (ref 3.5–5.1)
Sodium: 144 mmol/L (ref 135–145)
TCO2: 31 mmol/L (ref 22–32)
pCO2, Ven: 50.6 mmHg (ref 44–60)
pH, Ven: 7.371 (ref 7.25–7.43)
pO2, Ven: 45 mmHg (ref 32–45)

## 2022-10-23 MED ORDER — LACTATED RINGERS IV BOLUS
1000.0000 mL | Freq: Once | INTRAVENOUS | Status: AC
  Administered 2022-10-23: 1000 mL via INTRAVENOUS

## 2022-10-23 NOTE — ED Triage Notes (Signed)
States  his  sugar has been high since the last time he was here in the 500 s she states  hx of stroke with left sided deficit

## 2022-10-23 NOTE — ED Provider Notes (Addendum)
[place] [facility] Provider Note   CSN: [id number] Arrival date & time: 10/23/22  1101     History  Chief Complaint  Patient presents with   Hyperglycemia    Justin Molina. is a 62 y.o. male.  Patient seen here in August for elevated blood sugar around 500.  Family member states that patient's blood sugars have been pretty much running 400-500.  They contacted the primary care doctor has been following him for his diabetes and was told to come in.  Patient without any nausea or vomiting.  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  And also speech problems.  No nausea no vomiting no fevers no other symptoms other than blood sugar being high.  When patient was seen here in August he received just IV fluids in the blood sugar came down nicely.  He was referred back to his primary care doctor.  Patient used to be on Levemir but sounds as if they cannot get that anymore so he has been treated with long-acting insulin.  Not clear how well-controlled he is.  Patient has been seen by the primary care doctor since the last ED visit in August.  Past medical history significant for diabetes history of stroke hypertension chronic renal insufficiency.  Patient is never used tobacco products.       Home Medications Prior to Admission medications   Medication Sig Start Date End Date Taking? Authorizing Provider  acetaminophen (TYLENOL) 500 MG tablet Take 1 tablet (500 mg total) by mouth every 6 (six) hours as needed. 12/26/14   Teressa Lower, NP  amLODipine (NORVASC) 5 MG tablet Take 1 tablet (5 mg total) by mouth daily. 11/17/21   Ellsworth Lennox, PA-C  atorvastatin (LIPITOR) 80 MG tablet Take 1 tablet (80 mg total) by mouth daily. 11/17/21   Ellsworth Lennox, PA-C  bisacodyl (DULCOLAX) 5 MG EC tablet Take 1 tablet (5 mg total) by mouth 2 (two) times daily. 11/17/21   Ellsworth Lennox, PA-C  blood glucose meter kit and supplies KIT Dispense  based on patient and insurance preference. Use up to four times daily as directed. 11/17/21   Ellsworth Lennox, PA-C  Blood Glucose Monitoring Suppl (ACCU-CHEK AVIVA PLUS) W/DEVICE KIT Check blood sugar TID  & QHS 11/17/14   Ambrose Finland, NP  Blood Pressure Monitoring (BLOOD PRESSURE CUFF) MISC 1 Units by Does not apply route daily. 11/17/21   Ellsworth Lennox, PA-C  carvedilol (COREG) 25 MG tablet Take 1 tablet (25 mg total) by mouth 2 (two) times daily with a meal. Patient needs office visit for more refills 11/17/21   Ellsworth Lennox, PA-C  doxazosin (CARDURA) 8 MG tablet Take 1 tablet (8 mg total) by mouth daily. 11/17/21   Ellsworth Lennox, PA-C  ELIQUIS 5 MG TABS tablet Take 1 tablet (5 mg total) by mouth 2 (two) times daily. 11/17/21   Ellsworth Lennox, PA-C  glucose blood (ACCU-CHEK AVIVA PLUS) test strip Use as instructed for TID and QHS blood glucose testing 05/23/15   Quentin Angst, MD  hydrALAZINE (APRESOLINE) 100 MG tablet Take 1 tablet (100 mg total) by mouth 3 (three) times daily. 11/17/21   Ellsworth Lennox, PA-C  insulin detemir (LEVEMIR) 100 UNIT/ML injection Inject 6 Units into the skin at bedtime.    [provider]  isosorbide mononitrate (IMDUR) 120 MG 24 hr tablet Take 1 tablet (120 mg total) by mouth daily. 11/17/21   Ellsworth Lennox, PA-C  Lancets 28G MISC  Check blood sugar TID & QHS 05/23/15   Quentin Angst, MD  loperamide (IMODIUM) 2 MG capsule Take 1 capsule (2 mg total) by mouth 4 (four) times daily as needed for diarrhea or loose stools. 01/06/22   Gailen Shelter, PA  Misc. Devices (BATHTUB SAFETY RAIL) MISC Patient needs safety rails for shower 01/04/15   Holland [place] A, NP  MULTAQ 400 MG tablet Take 1 tablet (400 mg total) by mouth 2 (two) times daily. 11/17/21   Ellsworth Lennox, PA-C  ondansetron (ZOFRAN-ODT) 4 MG disintegrating tablet Take 1 tablet (4 mg total) by mouth every 8 (eight) hours as needed for nausea or vomiting. 01/06/22   Gailen Shelter, PA  potassium  chloride (KLOR-CON M) 10 MEQ tablet Take 1 tablet (10 mEq total) by mouth daily. 11/17/21   Ellsworth Lennox, PA-C  predniSONE (DELTASONE) 10 MG tablet Take 2 tablets (20 mg total) by mouth daily. 02/04/22   Wynetta Fines, MD  traMADol (ULTRAM) 50 MG tablet Take 50 mg by mouth every 6 (six) hours as needed for moderate pain or severe pain.    [provider]      Allergies    Contrast media [iodinated contrast media]    Review of Systems   Review of Systems  Constitutional:  Negative for chills and fever.  HENT:  Negative for ear pain and sore throat.   Eyes:  Negative for pain and visual disturbance.  Respiratory:  Negative for cough and shortness of breath.   Cardiovascular:  Negative for chest pain and palpitations.  Gastrointestinal:  Negative for abdominal pain and vomiting.  Genitourinary:  Negative for dysuria and hematuria.  Musculoskeletal:  Negative for arthralgias and back pain.  Skin:  Negative for color change and rash.  Neurological:  Positive for speech difficulty and weakness. Negative for seizures and syncope.  All other systems reviewed and are negative.   Physical Exam Updated Vital Signs BP 134/79 (BP Location: Right Arm)   Pulse 66   Temp 98 F (36.7 C) (Oral)   Resp 20   Ht 1.803 m (5\' 11" )   Wt 127 kg   SpO2 97%   BMI 39.05 kg/m  Physical Exam Vitals and nursing note reviewed.  Constitutional:      General: He is not in acute distress.    Appearance: Normal appearance. He is well-developed.  HENT:     Head: Normocephalic and atraumatic.  Eyes:     Extraocular Movements: Extraocular movements intact.     Conjunctiva/sclera: Conjunctivae normal.     Pupils: Pupils are equal, round, and reactive to light.  Cardiovascular:     Rate and Rhythm: Normal rate and regular rhythm.     Heart sounds: No murmur heard. Pulmonary:     Effort: Pulmonary effort is normal. No respiratory distress.     Breath sounds: Normal breath sounds.  Abdominal:      Palpations: Abdomen is soft.     Tenderness: There is no abdominal tenderness.  Musculoskeletal:        General: No swelling.     Cervical back: Normal range of motion and neck supple.  Skin:    General: Skin is warm and dry.     Capillary Refill: Capillary refill takes less than 2 seconds.  Neurological:     Mental Status: He is alert. Mental status is at baseline.     Comments: Right-sided weakness.  Psychiatric:        Mood and Affect: Mood normal.  ED Results / Procedures / Treatments   Labs (all labs ordered are listed, but only abnormal results are displayed) Labs Reviewed  CBC WITH DIFFERENTIAL/PLATELET - Abnormal; Notable for the following components:      Result Value   MCV 79.8 (*)    Platelets 128 (*)    All other components within normal limits  BASIC METABOLIC PANEL - Abnormal; Notable for the following components:   Potassium 3.4 (*)    Glucose, Bld 367 (*)    Creatinine, Ser 1.64 (*)    Calcium 8.5 (*)    GFR, Estimated 47 (*)    All other components within normal limits  CBG MONITORING, ED - Abnormal; Notable for the following components:   Glucose-Capillary 411 (*)    All other components within normal limits  I-STAT VENOUS BLOOD GAS, ED - Abnormal; Notable for the following components:   Bicarbonate 29.3 (*)    Acid-Base Excess 3.0 (*)    All other components within normal limits    EKG None  Radiology No results found.  Procedures Procedures    Medications Ordered in ED Medications  lactated ringers bolus 1,000 mL (1,000 mLs Intravenous New Bag/Given 10/23/22 1318)    ED Course/ Medical Decision Making/ A&P                                 Medical Decision Making Amount and/or Complexity of Data Reviewed Labs: ordered.   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  Patient's blood sugars at home are running 500.  Here blood sugar 411.  Venous blood gas without any acidosis which is reassuring.  CBC normal.   Patient metabolic panel pending.  Patient receiving some IV fluids.  Patient may receive some IV insulin.  Based on normal electrolytes it is probably not require admission.  A little skeptical on how well he is being followed by his primary care doctor regarding the blood sugars.  Metabolic panel shows actually improvement in his GFR from when he was seen in August.  Blood sugar now down to 262.  Patient used to be on Levemir.  Do not know why he was not switched over to [place].  Will inquire.  Contacted his primary care doctor's office.  Had discussion with nurse.  Patient was last seen September 3.  At that time they recommended increasing insulin to 15 units.  Patient's family member feels that has been on 15 units for a while.  So some disconnect here.  They are going to call him.  He took down the information.  Recommend that family member checks his blood sugar keep a record and they can see if they want to make an adjustment.  He is on an insulin pen.  That is reassuring.  Also will give referral to endocrinology information provided for her to call.  It is with [facility] endocrinology.  Seems as if she wants second opinion on the blood sugar control.   Final Clinical Impression(s) / ED Diagnoses Final diagnoses:  Hyperglycemia  Type 2 diabetes mellitus without complication, with long-term current use of insulin (HCC)    Rx / DC Orders ED Discharge Orders     None      Addendum: Patient's significant was convinced I had seen this patient before and she did not like my attitude then. This made for a challenging encounter.Best I can tell from record review, I have never seen this  patient before. It was clear they needed closer follow up by primary provider to better help control blood sugars based on recent ED visit and BS still running high. No indication for admission today.    Vanetta Mulders, MD 10/23/22 1353    Vanetta Mulders, MD 10/23/22 1414    Vanetta Mulders,  MD 10/23/22 1548    Vanetta Mulders, MD 10/25/22 1213

## 2022-10-23 NOTE — Discharge Instructions (Addendum)
Discussed with your primary care office.  They want you to use the insulin pen at 15 units.  I would recommend giving them a call tomorrow to see what the blood sugar is at and see if they want to make some increase in the amount of units.  For second opinion I did give you information about  endocrinology that did pull up in our system.  I would call them to see if he can set up an appointment.

## 2022-11-06 NOTE — Progress Notes (Signed)
Seen by casting department

## 2022-12-12 DIAGNOSIS — I5030 Unspecified diastolic (congestive) heart failure: Secondary | ICD-10-CM | POA: Insufficient documentation

## 2022-12-12 DIAGNOSIS — I5032 Chronic diastolic (congestive) heart failure: Secondary | ICD-10-CM | POA: Insufficient documentation

## 2022-12-27 DIAGNOSIS — E876 Hypokalemia: Secondary | ICD-10-CM | POA: Diagnosis present

## 2023-01-08 ENCOUNTER — Encounter (HOSPITAL_BASED_OUTPATIENT_CLINIC_OR_DEPARTMENT_OTHER): Payer: Self-pay

## 2023-01-08 ENCOUNTER — Emergency Department (HOSPITAL_BASED_OUTPATIENT_CLINIC_OR_DEPARTMENT_OTHER): Payer: Medicare PPO

## 2023-01-08 ENCOUNTER — Other Ambulatory Visit: Payer: Self-pay

## 2023-01-08 ENCOUNTER — Emergency Department (HOSPITAL_BASED_OUTPATIENT_CLINIC_OR_DEPARTMENT_OTHER)
Admission: EM | Admit: 2023-01-08 | Discharge: 2023-01-08 | Disposition: A | Discharge status: Home / Self Care | Payer: Medicare PPO | Attending: Emergency Medicine | Admitting: Emergency Medicine

## 2023-01-08 DIAGNOSIS — R531 Weakness: Secondary | ICD-10-CM | POA: Diagnosis not present

## 2023-01-08 DIAGNOSIS — I10 Essential (primary) hypertension: Secondary | ICD-10-CM | POA: Insufficient documentation

## 2023-01-08 DIAGNOSIS — R5383 Other fatigue: Secondary | ICD-10-CM | POA: Insufficient documentation

## 2023-01-08 DIAGNOSIS — S81801A Unspecified open wound, right lower leg, initial encounter: Secondary | ICD-10-CM | POA: Diagnosis present

## 2023-01-08 DIAGNOSIS — Z8673 Personal history of transient ischemic attack (TIA), and cerebral infarction without residual deficits: Secondary | ICD-10-CM | POA: Insufficient documentation

## 2023-01-08 DIAGNOSIS — Z20822 Contact with and (suspected) exposure to covid-19: Secondary | ICD-10-CM | POA: Insufficient documentation

## 2023-01-08 DIAGNOSIS — R600 Localized edema: Secondary | ICD-10-CM | POA: Insufficient documentation

## 2023-01-08 DIAGNOSIS — X58XXXA Exposure to other specified factors, initial encounter: Secondary | ICD-10-CM | POA: Diagnosis not present

## 2023-01-08 DIAGNOSIS — E119 Type 2 diabetes mellitus without complications: Secondary | ICD-10-CM | POA: Diagnosis not present

## 2023-01-08 DIAGNOSIS — R0602 Shortness of breath: Secondary | ICD-10-CM | POA: Diagnosis not present

## 2023-01-08 DIAGNOSIS — M79604 Pain in right leg: Secondary | ICD-10-CM | POA: Insufficient documentation

## 2023-01-08 LAB — BASIC METABOLIC PANEL
Anion gap: 8 (ref 5–15)
BUN: 23 mg/dL (ref 8–23)
CO2: 30 mmol/L (ref 22–32)
Calcium: 8.8 mg/dL — ABNORMAL LOW (ref 8.9–10.3)
Chloride: 107 mmol/L (ref 98–111)
Creatinine, Ser: 1.98 mg/dL — ABNORMAL HIGH (ref 0.61–1.24)
GFR, Estimated: 37 mL/min — ABNORMAL LOW (ref >60)
Glucose, Bld: 146 mg/dL — ABNORMAL HIGH (ref 70–99)
Potassium: 3.5 mmol/L (ref 3.5–5.1)
Sodium: 145 mmol/L (ref 135–145)

## 2023-01-08 LAB — CBC WITH DIFFERENTIAL/PLATELET
Abs Immature Granulocytes: 0.01 10*3/uL (ref 0.00–0.07)
Basophils Absolute: 0 10*3/uL (ref 0.0–0.1)
Basophils Relative: 0 %
Eosinophils Absolute: 0.2 10*3/uL (ref 0.0–0.5)
Eosinophils Relative: 2 %
HCT: 39.1 % (ref 39.0–52.0)
Hemoglobin: 12.8 g/dL — ABNORMAL LOW (ref 13.0–17.0)
Immature Granulocytes: 0 %
Lymphocytes Relative: 13 %
Lymphs Abs: 1.1 10*3/uL (ref 0.7–4.0)
MCH: 27.2 pg (ref 26.0–34.0)
MCHC: 32.7 g/dL (ref 30.0–36.0)
MCV: 83 fL (ref 80.0–100.0)
Monocytes Absolute: 0.8 10*3/uL (ref 0.1–1.0)
Monocytes Relative: 9 %
Neutro Abs: 6.5 10*3/uL (ref 1.7–7.7)
Neutrophils Relative %: 76 %
Platelets: 186 10*3/uL (ref 150–400)
RBC: 4.71 MIL/uL (ref 4.22–5.81)
RDW: 14.2 % (ref 11.5–15.5)
WBC: 8.6 10*3/uL (ref 4.0–10.5)
nRBC: 0 % (ref 0.0–0.2)

## 2023-01-08 LAB — URINALYSIS, MICROSCOPIC (REFLEX): Bacteria, UA: NONE SEEN

## 2023-01-08 LAB — RESP PANEL BY RT-PCR (RSV, FLU A&B, COVID)  RVPGX2
Influenza A by PCR: NEGATIVE
Influenza B by PCR: NEGATIVE
Resp Syncytial Virus by PCR: NEGATIVE
SARS Coronavirus 2 by RT PCR: NEGATIVE

## 2023-01-08 LAB — URINALYSIS, ROUTINE W REFLEX MICROSCOPIC
Bilirubin Urine: NEGATIVE
Glucose, UA: >=500 mg/dL — AB
Ketones, ur: NEGATIVE mg/dL
Leukocytes,Ua: NEGATIVE
Nitrite: NEGATIVE
Protein, ur: 30 mg/dL — AB
Specific Gravity, Urine: 1.01 (ref 1.005–1.030)
pH: 5.5 (ref 5.0–8.0)

## 2023-01-08 LAB — BRAIN NATRIURETIC PEPTIDE: B Natriuretic Peptide: 326.3 pg/mL — ABNORMAL HIGH (ref 0.0–100.0)

## 2023-01-08 LAB — TROPONIN I (HIGH SENSITIVITY)
Troponin I (High Sensitivity): 26 ng/L — ABNORMAL HIGH (ref <18)
Troponin I (High Sensitivity): 26 ng/L — ABNORMAL HIGH (ref <18)

## 2023-01-08 NOTE — ED Notes (Signed)
Pt alert and oriented X 4 at the time of discharge. RR even and unlabored. No acute distress noted. Pt verbalized understanding of discharge instructions as discussed. Pt ambulatory to lobby at time of discharge.

## 2023-01-08 NOTE — ED Provider Notes (Signed)
Emergency Department Provider Note   I have reviewed the triage vital signs and the nursing notes.   HISTORY  Chief Complaint Weakness   HPI Justin Molina. is a 62 y.o. male past history of renal insufficiency, diabetes, hypertension, stroke with residual right-sided weakness presents to the emergency department with generalized weakness, fluid retention, right leg swelling.  Patient has chronic edema in the legs and was recently started back on his diuretic.  He has undergone diuresis for the past 2 weeks as well as been switched to [place] and Ozempic.  His wife, at bedside, states that his blood sugars have been in the normal range after changing his diabetes meds.  She has noticed a return of the wound on the right leg as well as increased swelling in that extremity.  She has been wrapping the leg to help decrease edema.  Over the past several days he has had generalized weakness and fatigue.  Has been sleeping more and less active.  Patient denies any chest pain or shortness of breath.  No abdominal pain, vomiting, diarrhea. Denies orthopnea or PND.   Wife notes prior leg wounds when they were living in [place] and improved with wound care mgmt. They have recently been referred by there PCP but have yet to have their first appointment.    Past Medical History:  Diagnosis Date   Chronic renal insufficiency    Diabetes mellitus without complication (HCC)    Hypertension    Stroke [facility])     Review of Systems  Constitutional: No fever/chills. Positive generalized weakness.  Cardiovascular: Denies chest pain. Respiratory: Denies SOB.  Gastrointestinal: No abdominal pain.  No nausea, no vomiting.   Genitourinary: Negative for dysuria. Musculoskeletal: Negative for back pain. Skin: New wound to the RLE.  Neurological: Negative for headaches. ____________________________________________   PHYSICAL EXAM:  VITAL SIGNS: ED Triage Vitals  Encounter Vitals Group     BP  01/08/23 1019 (!) 162/93     Pulse Rate 01/08/23 1019 77     Resp 01/08/23 1019 (!) 28     Temp 01/08/23 1019 98.8 F (37.1 C)     Temp Source 01/08/23 1019 Oral     SpO2 01/08/23 1019 98 %     Weight --      Height 01/08/23 1017 5\' 11"  (1.803 m)   Constitutional: Alert and oriented. Well appearing and in no acute distress. Eyes: Conjunctivae are normal.  Head: Atraumatic. Nose: No congestion/rhinnorhea. Mouth/Throat: Mucous membranes are moist.  Neck: No stridor.  Cardiovascular: Normal rate, regular rhythm. Good peripheral circulation. Grossly normal heart sounds.   Respiratory: Normal respiratory effort.  No retractions. Lungs CTAB. Gastrointestinal: Soft and nontender. No distention.  Musculoskeletal: No lower extremity tenderness with bilateral pitting edema (2+) with increased swelling in the RLE compared to the left. No gross deformities of extremities. Neurologic:  Normal speech and language. Baseline weakness in the RUE and RLE from prior CVA.  Skin:  Skin is warm and dry. Shallow 4x5 cm ulcer to the right lateral lower extremity.   ____________________________________________   LABS (all labs ordered are listed, but only abnormal results are displayed)  Labs Reviewed  BASIC METABOLIC PANEL - Abnormal; Notable for the following components:      Result Value   Glucose, Bld 146 (*)    Creatinine, Ser 1.98 (*)    Calcium 8.8 (*)    GFR, Estimated 37 (*)    All other components within normal limits  BRAIN NATRIURETIC PEPTIDE -  Abnormal; Notable for the following components:   B Natriuretic Peptide 326.3 (*)    All other components within normal limits  CBC WITH DIFFERENTIAL/PLATELET - Abnormal; Notable for the following components:   Hemoglobin 12.8 (*)    All other components within normal limits  URINALYSIS, ROUTINE W REFLEX MICROSCOPIC - Abnormal; Notable for the following components:   Glucose, UA >=500 (*)    Hgb urine dipstick TRACE (*)    Protein, ur 30 (*)     All other components within normal limits  TROPONIN I (HIGH SENSITIVITY) - Abnormal; Notable for the following components:   Troponin I (High Sensitivity) 26 (*)    All other components within normal limits  TROPONIN I (HIGH SENSITIVITY) - Abnormal; Notable for the following components:   Troponin I (High Sensitivity) 26 (*)    All other components within normal limits  RESP PANEL BY RT-PCR (RSV, FLU A&B, COVID)  [id number]  URINALYSIS, MICROSCOPIC (REFLEX)   ____________________________________________  EKG   EKG Interpretation Date/Time:  Tuesday January 08 2023 10:28:00 EST Ventricular Rate:  75 PR Interval:  217 QRS Duration:  108 QT Interval:  420 QTC Calculation: 470 R Axis:   -29  Text Interpretation: Sinus rhythm Borderline prolonged PR interval Left atrial enlargement Incomplete left bundle branch block Consider anterior infarct Similar to Feb 2017 tracing Confirmed by Alona Bene [phone]) on 01/08/2023 10:32:34 AM        ____________________________________________  RADIOLOGY  US Venous Img Lower Right (DVT Study)  Result Date: 01/08/2023 CLINICAL DATA:  SOB - weakness EXAM: RIGHT LOWER EXTREMITY VENOUS DOPPLER ULTRASOUND TECHNIQUE: Gray-scale sonography with compression, as well as color and duplex ultrasound, were performed to evaluate the deep venous system(s) from the level of the common femoral vein through the popliteal and proximal calf veins. COMPARISON:  Chest XR, concurrent. RIGHT lower extremity XRs 07/22/2016. FINDINGS: Suboptimal evaluation, with poor acoustic penetration secondary to patient habitus. VENOUS Normal compressibility of the common femoral, superficial femoral, and popliteal veins, as well as the visualized calf veins. Visualized portions of profunda femoral vein and great saphenous vein unremarkable. No filling defects to suggest DVT on grayscale or color Doppler imaging. Doppler waveforms show normal direction of venous flow, normal respiratory  plasticity and response to augmentation. Limited views of the contralateral common femoral vein are unremarkable. OTHER No evidence of superficial thrombophlebitis or abnormal fluid collection. Subcutaneous edema within the imaged distal RIGHT lower extremity Limitations: Patient body habitus IMPRESSION: Suboptimal evaluation, within these constraints; No evidence of femoropopliteal DVT or superficial thrombophlebitis within the RIGHT lower extremity. Roanna Banning, MD [facility] Radiology Electronically Signed   By: Roanna Banning M.D.   On: 01/08/2023 12:34   DG Chest 2 View  Result Date: 01/08/2023 CLINICAL DATA:  Shortness of breath. EXAM: CHEST - 2 VIEW COMPARISON:  February 04, 2022. FINDINGS: Stable cardiomegaly. Both lungs are clear. The visualized skeletal structures are unremarkable. IMPRESSION: No active cardiopulmonary disease. Electronically Signed   By: Lupita Raider M.D.   On: 01/08/2023 11:25    ____________________________________________   PROCEDURES  Procedure(s) performed:   Procedures  None  ____________________________________________   INITIAL IMPRESSION / ASSESSMENT AND PLAN / ED COURSE  Pertinent labs & imaging results that were available during my care of the patient were reviewed by me and considered in my medical decision making (see chart for details).   This patient is Presenting for Evaluation of weakness, which does require a range of treatment options, and  is a complaint that involves a moderate risk of morbidity and mortality.  The Differential Diagnoses include vascular insufficiency, acute kidney injury, CHF, developing infection, cellulitis, DVT, etc.  I did obtain Additional Historical Information from wife at bedside.   Clinical Laboratory Tests Ordered, included UA without infection.  Troponin mildly elevated 26 and unchanged on delta.  COVID, flu, RSV negative.  Minimally elevated BNP. No  leukocytosis. CKD at baseline.   Radiologic Tests Ordered, included CXR and DVT US. I independently interpreted the images and agree with radiology interpretation.   Cardiac Monitor Tracing which shows NSR.    Social Determinants of Health Risk patient is a non-smoker.    Medical Decision Making: Summary:  Patient presents emergency department for evaluation of generalized weakness, leg swelling, fatigue.  He has recently started multiple new medications both for his diabetes as well as his extremity swelling.  Swelling is likely chronic with a new, shallow wound developing in the right lower extremity. Does not appears infected. Compartments are soft. Palpable DP pulse on the right. Plan for DVT US, CXR to assess for possible pulmonary edema, and screening labs.   Reevaluation with update and discussion with patient and wife. Plan for wound care at home and referral placed for wound care follow up. Nursing instructed wife on dressing changes, demonstrated at bedside, and provided supplies. Will have patient follow up with PCP to ensure insurance accepts the ED referral. No abx for now with no evidence of infection. Strict ED return precautions discussed.   Considered admission but ED workup is largely unremarkable.   Patient's presentation is most consistent with acute presentation with potential threat to life or bodily function.   Disposition: discharge  ____________________________________________  FINAL CLINICAL IMPRESSION(S) / ED DIAGNOSES  Final diagnoses:  Generalized weakness  Wound of right lower extremity, initial encounter     Note:  This document was prepared using Dragon voice recognition software and may include unintentional dictation errors.  Alona Bene, MD, [facility] Emergency Medicine    Jena Tegeler, Arlyss Repress, MD 01/08/23 [phone]

## 2023-01-08 NOTE — Discharge Instructions (Signed)
You were seen in the emerged from today with wound to your right leg.  We discussed dressing changes and have sent you home with some supplies to help with this.  I would not start antibiotics at this point but I have placed a referral to the wound care center.  Please call and schedule the closest follow-up appointment possible.  Your primary care doctor may also need to help with this.  If you develop fever, increased drainage, other sudden/severe symptoms you should return to the emergency department for reevaluation.

## 2023-01-08 NOTE — ED Triage Notes (Signed)
Started new diuretic x 2 weeks ago, states he has been increasingly fatigued and sleeping more. 1st dose of ozempic on Sunday. Wife noticed some breathing difficulty, pt denies shortness of breath.  Wound to right lower leg increasing in size. Pt is diabetic.  Right sided weakness and expressive aphasia at baseline after CVA 15 years ago. No new stroke like symptoms per family and patient.

## 2023-01-08 NOTE — ED Notes (Signed)
Fall risk armband Fall risk socks Fall risk sign on door

## 2023-01-10 ENCOUNTER — Ambulatory Visit: Payer: Medicare PPO | Admitting: Podiatry

## 2023-01-14 ENCOUNTER — Ambulatory Visit: Payer: Medicare PPO | Admitting: Dietician

## 2023-01-17 ENCOUNTER — Emergency Department (HOSPITAL_BASED_OUTPATIENT_CLINIC_OR_DEPARTMENT_OTHER): Payer: Medicare PPO

## 2023-01-17 ENCOUNTER — Other Ambulatory Visit: Payer: Self-pay

## 2023-01-17 ENCOUNTER — Inpatient Hospital Stay (HOSPITAL_BASED_OUTPATIENT_CLINIC_OR_DEPARTMENT_OTHER)
Admission: EM | Admit: 2023-01-17 | Discharge: 2023-01-25 | DRG: 871 | Disposition: A | Discharge status: Skilled Nursing Facility | Payer: Medicare PPO | Attending: Family Medicine | Admitting: Family Medicine

## 2023-01-17 ENCOUNTER — Encounter (HOSPITAL_BASED_OUTPATIENT_CLINIC_OR_DEPARTMENT_OTHER): Payer: Self-pay

## 2023-01-17 DIAGNOSIS — G4733 Obstructive sleep apnea (adult) (pediatric): Secondary | ICD-10-CM | POA: Diagnosis present

## 2023-01-17 DIAGNOSIS — I2510 Atherosclerotic heart disease of native coronary artery without angina pectoris: Secondary | ICD-10-CM | POA: Diagnosis present

## 2023-01-17 DIAGNOSIS — D649 Anemia, unspecified: Secondary | ICD-10-CM | POA: Diagnosis present

## 2023-01-17 DIAGNOSIS — Z794 Long term (current) use of insulin: Secondary | ICD-10-CM

## 2023-01-17 DIAGNOSIS — L03115 Cellulitis of right lower limb: Secondary | ICD-10-CM | POA: Diagnosis present

## 2023-01-17 DIAGNOSIS — E876 Hypokalemia: Secondary | ICD-10-CM | POA: Diagnosis present

## 2023-01-17 DIAGNOSIS — Z7985 Long-term (current) use of injectable non-insulin antidiabetic drugs: Secondary | ICD-10-CM

## 2023-01-17 DIAGNOSIS — E1169 Type 2 diabetes mellitus with other specified complication: Secondary | ICD-10-CM | POA: Diagnosis present

## 2023-01-17 DIAGNOSIS — I509 Heart failure, unspecified: Secondary | ICD-10-CM | POA: Diagnosis present

## 2023-01-17 DIAGNOSIS — I4891 Unspecified atrial fibrillation: Secondary | ICD-10-CM | POA: Diagnosis present

## 2023-01-17 DIAGNOSIS — R9431 Abnormal electrocardiogram [ECG] [EKG]: Secondary | ICD-10-CM | POA: Diagnosis not present

## 2023-01-17 DIAGNOSIS — E1122 Type 2 diabetes mellitus with diabetic chronic kidney disease: Secondary | ICD-10-CM | POA: Diagnosis present

## 2023-01-17 DIAGNOSIS — I130 Hypertensive heart and chronic kidney disease with heart failure and stage 1 through stage 4 chronic kidney disease, or unspecified chronic kidney disease: Secondary | ICD-10-CM | POA: Diagnosis present

## 2023-01-17 DIAGNOSIS — R6889 Other general symptoms and signs: Secondary | ICD-10-CM | POA: Diagnosis not present

## 2023-01-17 DIAGNOSIS — R112 Nausea with vomiting, unspecified: Secondary | ICD-10-CM | POA: Diagnosis not present

## 2023-01-17 DIAGNOSIS — E1121 Type 2 diabetes mellitus with diabetic nephropathy: Secondary | ICD-10-CM

## 2023-01-17 DIAGNOSIS — G9341 Metabolic encephalopathy: Secondary | ICD-10-CM | POA: Diagnosis present

## 2023-01-17 DIAGNOSIS — Z79899 Other long term (current) drug therapy: Secondary | ICD-10-CM

## 2023-01-17 DIAGNOSIS — Z6841 Body Mass Index (BMI) 40.0 and over, adult: Secondary | ICD-10-CM

## 2023-01-17 DIAGNOSIS — N179 Acute kidney failure, unspecified: Secondary | ICD-10-CM | POA: Diagnosis not present

## 2023-01-17 DIAGNOSIS — I69351 Hemiplegia and hemiparesis following cerebral infarction affecting right dominant side: Secondary | ICD-10-CM | POA: Diagnosis not present

## 2023-01-17 DIAGNOSIS — A419 Sepsis, unspecified organism: Secondary | ICD-10-CM | POA: Diagnosis not present

## 2023-01-17 DIAGNOSIS — E1129 Type 2 diabetes mellitus with other diabetic kidney complication: Secondary | ICD-10-CM | POA: Diagnosis present

## 2023-01-17 DIAGNOSIS — Z7984 Long term (current) use of oral hypoglycemic drugs: Secondary | ICD-10-CM

## 2023-01-17 DIAGNOSIS — N1832 Chronic kidney disease, stage 3b: Secondary | ICD-10-CM | POA: Diagnosis present

## 2023-01-17 DIAGNOSIS — L97915 Non-pressure chronic ulcer of unspecified part of right lower leg with muscle involvement without evidence of necrosis: Secondary | ICD-10-CM | POA: Diagnosis not present

## 2023-01-17 DIAGNOSIS — N183 Chronic kidney disease, stage 3 unspecified: Secondary | ICD-10-CM | POA: Diagnosis present

## 2023-01-17 DIAGNOSIS — R6520 Severe sepsis without septic shock: Secondary | ICD-10-CM | POA: Diagnosis present

## 2023-01-17 DIAGNOSIS — I69320 Aphasia following cerebral infarction: Secondary | ICD-10-CM

## 2023-01-17 DIAGNOSIS — L0390 Cellulitis, unspecified: Secondary | ICD-10-CM | POA: Diagnosis present

## 2023-01-17 DIAGNOSIS — I69322 Dysarthria following cerebral infarction: Secondary | ICD-10-CM

## 2023-01-17 DIAGNOSIS — I6930 Unspecified sequelae of cerebral infarction: Secondary | ICD-10-CM | POA: Diagnosis not present

## 2023-01-17 DIAGNOSIS — M25562 Pain in left knee: Secondary | ICD-10-CM | POA: Diagnosis not present

## 2023-01-17 DIAGNOSIS — Z1152 Encounter for screening for COVID-19: Secondary | ICD-10-CM

## 2023-01-17 DIAGNOSIS — E785 Hyperlipidemia, unspecified: Secondary | ICD-10-CM | POA: Diagnosis present

## 2023-01-17 DIAGNOSIS — Z7901 Long term (current) use of anticoagulants: Secondary | ICD-10-CM

## 2023-01-17 DIAGNOSIS — Z91041 Radiographic dye allergy status: Secondary | ICD-10-CM

## 2023-01-17 LAB — URINALYSIS, W/ REFLEX TO CULTURE (INFECTION SUSPECTED)
Bacteria, UA: NONE SEEN
Bilirubin Urine: NEGATIVE
Glucose, UA: >=500 mg/dL — AB
Ketones, ur: NEGATIVE mg/dL
Leukocytes,Ua: NEGATIVE
Nitrite: NEGATIVE
Protein, ur: 100 mg/dL — AB
Specific Gravity, Urine: 1.01 (ref 1.005–1.030)
pH: 6 (ref 5.0–8.0)

## 2023-01-17 LAB — COMPREHENSIVE METABOLIC PANEL
ALT: 14 U/L (ref 0–44)
AST: 24 U/L (ref 15–41)
Albumin: 3.1 g/dL — ABNORMAL LOW (ref 3.5–5.0)
Alkaline Phosphatase: 60 U/L (ref 38–126)
Anion gap: 10 (ref 5–15)
BUN: 22 mg/dL (ref 8–23)
CO2: 25 mmol/L (ref 22–32)
Calcium: 7.9 mg/dL — ABNORMAL LOW (ref 8.9–10.3)
Chloride: 104 mmol/L (ref 98–111)
Creatinine, Ser: 2.17 mg/dL — ABNORMAL HIGH (ref 0.61–1.24)
GFR, Estimated: 34 mL/min — ABNORMAL LOW (ref >60)
Glucose, Bld: 101 mg/dL — ABNORMAL HIGH (ref 70–99)
Potassium: 3.3 mmol/L — ABNORMAL LOW (ref 3.5–5.1)
Sodium: 139 mmol/L (ref 135–145)
Total Bilirubin: 0.8 mg/dL (ref <1.2)
Total Protein: 8.6 g/dL — ABNORMAL HIGH (ref 6.5–8.1)

## 2023-01-17 LAB — RESP PANEL BY RT-PCR (RSV, FLU A&B, COVID)  RVPGX2
Influenza A by PCR: NEGATIVE
Influenza B by PCR: NEGATIVE
Resp Syncytial Virus by PCR: NEGATIVE
SARS Coronavirus 2 by RT PCR: NEGATIVE

## 2023-01-17 LAB — CBC WITH DIFFERENTIAL/PLATELET
Abs Immature Granulocytes: 0.04 10*3/uL (ref 0.00–0.07)
Basophils Absolute: 0 10*3/uL (ref 0.0–0.1)
Basophils Relative: 0 %
Eosinophils Absolute: 0.1 10*3/uL (ref 0.0–0.5)
Eosinophils Relative: 1 %
HCT: 36.5 % — ABNORMAL LOW (ref 39.0–52.0)
Hemoglobin: 12.1 g/dL — ABNORMAL LOW (ref 13.0–17.0)
Immature Granulocytes: 0 %
Lymphocytes Relative: 11 %
Lymphs Abs: 1.2 10*3/uL (ref 0.7–4.0)
MCH: 27.1 pg (ref 26.0–34.0)
MCHC: 33.2 g/dL (ref 30.0–36.0)
MCV: 81.8 fL (ref 80.0–100.0)
Monocytes Absolute: 0.9 10*3/uL (ref 0.1–1.0)
Monocytes Relative: 8 %
Neutro Abs: 8.4 10*3/uL — ABNORMAL HIGH (ref 1.7–7.7)
Neutrophils Relative %: 80 %
Platelets: 292 10*3/uL (ref 150–400)
RBC: 4.46 MIL/uL (ref 4.22–5.81)
RDW: 14 % (ref 11.5–15.5)
WBC: 10.6 10*3/uL — ABNORMAL HIGH (ref 4.0–10.5)
nRBC: 0 % (ref 0.0–0.2)

## 2023-01-17 LAB — GLUCOSE, CAPILLARY: Glucose-Capillary: 101 mg/dL — ABNORMAL HIGH (ref 70–99)

## 2023-01-17 LAB — LACTIC ACID, PLASMA: Lactic Acid, Venous: 1.2 mmol/L (ref 0.5–1.9)

## 2023-01-17 MED ORDER — ACETAMINOPHEN 325 MG PO TABS: 650.0000 mg | ORAL_TABLET | Freq: Four times a day (QID) | ORAL | Status: DC | PRN

## 2023-01-17 MED ORDER — ONDANSETRON HCL 4 MG PO TABS: 4.0000 mg | ORAL_TABLET | Freq: Four times a day (QID) | ORAL | Status: DC | PRN

## 2023-01-17 MED ORDER — VITAMIN C 500 MG PO TABS
500.0000 mg | ORAL_TABLET | Freq: Every day | ORAL | Status: DC
  Administered 2023-01-18 – 2023-01-25 (×8): 500 mg via ORAL
  Filled 2023-01-17 (×8): qty 1

## 2023-01-17 MED ORDER — SODIUM CHLORIDE 0.9 % IV BOLUS
2000.0000 mL | Freq: Once | INTRAVENOUS | Status: AC
  Administered 2023-01-17: 2000 mL via INTRAVENOUS

## 2023-01-17 MED ORDER — VANCOMYCIN HCL IN DEXTROSE 1-5 GM/200ML-% IV SOLN
1000.0000 mg | Freq: Once | INTRAVENOUS | Status: AC
  Administered 2023-01-17: 1000 mg via INTRAVENOUS
  Filled 2023-01-17: qty 200

## 2023-01-17 MED ORDER — ACETAMINOPHEN 650 MG RE SUPP: 650.0000 mg | Freq: Four times a day (QID) | RECTAL | Status: DC | PRN

## 2023-01-17 MED ORDER — ACETAMINOPHEN 500 MG PO TABS
500.0000 mg | ORAL_TABLET | Freq: Four times a day (QID) | ORAL | Status: DC | PRN
  Administered 2023-01-24: 500 mg via ORAL
  Filled 2023-01-17: qty 1

## 2023-01-17 MED ORDER — ONDANSETRON HCL 4 MG/2ML IJ SOLN
4.0000 mg | Freq: Four times a day (QID) | INTRAMUSCULAR | Status: DC | PRN
  Administered 2023-01-20: 4 mg via INTRAVENOUS
  Filled 2023-01-17: qty 2

## 2023-01-17 MED ORDER — ORAL CARE MOUTH RINSE: 15.0000 mL | OROMUCOSAL | Status: DC | PRN

## 2023-01-17 MED ORDER — TAMSULOSIN HCL 0.4 MG PO CAPS: 0.4000 mg | ORAL_CAPSULE | Freq: Once | ORAL | Status: DC

## 2023-01-17 MED ORDER — POTASSIUM CHLORIDE CRYS ER 20 MEQ PO TBCR
40.0000 meq | EXTENDED_RELEASE_TABLET | Freq: Once | ORAL | Status: AC
Start: 2023-01-17 — End: 2023-01-17
  Administered 2023-01-17: 40 meq via ORAL
  Filled 2023-01-17: qty 2

## 2023-01-17 MED ORDER — SODIUM CHLORIDE 0.9 % IV BOLUS: 2000.0000 mL | Freq: Once | INTRAVENOUS | Status: DC

## 2023-01-17 MED ORDER — OXYCODONE-ACETAMINOPHEN 5-325 MG PO TABS: 1.0000 | ORAL_TABLET | Freq: Once | ORAL | Status: DC

## 2023-01-17 MED ORDER — SODIUM CHLORIDE 0.9 % IV SOLN
2.0000 g | Freq: Once | INTRAVENOUS | Status: AC
  Administered 2023-01-17: 2 g via INTRAVENOUS
  Filled 2023-01-17: qty 12.5

## 2023-01-17 MED ORDER — SORBITOL 70 % SOLN: 30.0000 mL | Freq: Every day | Status: DC | PRN

## 2023-01-17 MED ORDER — ACETAMINOPHEN 500 MG PO TABS
1000.0000 mg | ORAL_TABLET | Freq: Four times a day (QID) | ORAL | Status: DC | PRN
  Administered 2023-01-17: 1000 mg via ORAL
  Filled 2023-01-17: qty 2

## 2023-01-17 MED ORDER — ZINC SULFATE 220 (50 ZN) MG PO CAPS
220.0000 mg | ORAL_CAPSULE | Freq: Every day | ORAL | Status: DC
  Administered 2023-01-18 – 2023-01-25 (×8): 220 mg via ORAL
  Filled 2023-01-17 (×8): qty 1

## 2023-01-17 MED ORDER — SODIUM CHLORIDE 0.9 % IV BOLUS
1000.0000 mL | Freq: Once | INTRAVENOUS | Status: AC
Start: 2023-01-17 — End: 2023-01-17
  Administered 2023-01-17: 1000 mL via INTRAVENOUS

## 2023-01-17 NOTE — ED Notes (Signed)
Called floor for report, no answer x2. Will attempt again.

## 2023-01-17 NOTE — ED Notes (Signed)
ED TO INPATIENT HANDOFF REPORT  ED Nurse Name and Phone #: Dominga Ferry [facility] Paramedic [phone]  S Name/Age/Gender Justin Molina. 62 y.o. male Room/Bed: MH01/MH01  Code Status   Code Status: Not on file  Home/SNF/Other Home Patient oriented to: self, place, time, and situation Is this baseline? Yes   Triage Complete: Triage complete  Chief Complaint Sepsis [facility]) [A41.9]  Triage Note Pt BIB EMS for increasing weakness and fatigue for the past few weeks. Pt seen for the same a week ago and discharged. Pt has hx of CVA and does not ambulate. Per EMS, pt has had poor PO intake over the last few days. Pt as had right sided deficits from previous stroke.   148/80 90 98%  CBG 124   Allergies Allergies  Allergen Reactions   Contrast Media [Iodinated Contrast Media] Nausea And Vomiting and Swelling    Level of Care/Admitting Diagnosis ED Disposition     ED Disposition  Admit   Condition  --   Comment  Hospital [facility] [[id number]]  Level of Care: Progressive [102]  Admit to Progressive based on following criteria: MULTISYSTEM THREATS such as stable sepsis, metabolic/electrolyte imbalance with or without encephalopathy that is responding to early treatment.  May admit patient to [facility] or [facility] if equivalent level of care is available:: No  Interfacility transfer: Yes  Covid Evaluation: Confirmed COVID Negative  Diagnosis: Sepsis [facility]) [[id number]]  Admitting Physician: Elease Etienne [[id number]]  Attending Physician: Glyn Ade [[id number]]  Certification:: I certify this patient will need inpatient services for at least 2 midnights  Expected Medical Readiness: 01/21/2023          B Medical/Surgery History Past Medical History:  Diagnosis Date   Chronic renal insufficiency    Diabetes mellitus without complication (HCC)    Hypertension    Stroke [facility])    Past Surgical History:  Procedure Laterality Date    BRAIN SURGERY     GASTROSTOMY TUBE PLACEMENT     HERNIA REPAIR     TRACHEOSTOMY       A IV Location/Drains/Wounds Patient Lines/Drains/Airways Status     Active Line/Drains/Airways     Name Placement date Placement time Site Days   Peripheral IV 01/17/23 20 G Left Antecubital 01/17/23  1524  Antecubital  less than 1            Intake/Output Last 24 hours  Intake/Output Summary (Last 24 hours) at 01/17/2023 1812 Last data filed at 01/17/2023 1810 Gross per 24 hour  Intake 1297.45 ml  Output --  Net 1297.45 ml    Labs/Imaging Results for orders placed or performed during the hospital encounter of 01/17/23 (from the past 48 hours)  CBC with Differential     Status: Abnormal   Collection Time: 01/17/23  3:27 PM  Result Value Ref Range   WBC 10.6 (H) 4.0 - 10.5 K/uL   RBC 4.46 4.22 - 5.81 MIL/uL   Hemoglobin 12.1 (L) 13.0 - 17.0 g/dL   HCT 06.3 (L) 01.6 - 01.0 %   MCV 81.8 80.0 - 100.0 fL   MCH 27.1 26.0 - 34.0 pg   MCHC 33.2 30.0 - 36.0 g/dL   RDW 93.2 35.5 - 73.2 %   Platelets 292 150 - 400 K/uL   nRBC 0.0 0.0 - 0.2 %   Neutrophils Relative % 80 %   Neutro Abs 8.4 (H) 1.7 - 7.7 K/uL   Lymphocytes Relative 11 %   Lymphs Abs 1.2  0.7 - 4.0 K/uL   Monocytes Relative 8 %   Monocytes Absolute 0.9 0.1 - 1.0 K/uL   Eosinophils Relative 1 %   Eosinophils Absolute 0.1 0.0 - 0.5 K/uL   Basophils Relative 0 %   Basophils Absolute 0.0 0.0 - 0.1 K/uL   Immature Granulocytes 0 %   Abs Immature Granulocytes 0.04 0.00 - 0.07 K/uL    Comment: Performed at [facility], [address] [place], [place] [address]  Comprehensive metabolic panel     Status: Abnormal   Collection Time: 01/17/23  3:27 PM  Result Value Ref Range   Sodium 139 135 - 145 mmol/L   Potassium 3.3 (L) 3.5 - 5.1 mmol/L   Chloride 104 98 - 111 mmol/L   CO2 25 22 - 32 mmol/L   Glucose, Bld 101 (H) 70 - 99 mg/dL    Comment: Glucose reference range applies only to samples taken after fasting for  at least 8 hours.   BUN 22 8 - 23 mg/dL   Creatinine, Ser 6.04 (H) 0.61 - 1.24 mg/dL   Calcium 7.9 (L) 8.9 - 10.3 mg/dL   Total Protein 8.6 (H) 6.5 - 8.1 g/dL   Albumin 3.1 (L) 3.5 - 5.0 g/dL   AST 24 15 - 41 U/L   ALT 14 0 - 44 U/L   Alkaline Phosphatase 60 38 - 126 U/L   Total Bilirubin 0.8 <1.2 mg/dL   GFR, Estimated 34 (L) >60 mL/min    Comment: (NOTE) Calculated using the CKD-EPI Creatinine Equation (2021)    Anion gap 10 5 - 15    Comment: Performed at [facility], [address] [place], [place] [address]  Urinalysis, w/ Reflex to Culture (Infection Suspected) -Urine, Clean Catch     Status: Abnormal   Collection Time: 01/17/23  3:27 PM  Result Value Ref Range   Specimen Source URINE, CLEAN CATCH    Color, Urine YELLOW YELLOW   APPearance CLEAR CLEAR   Specific Gravity, Urine 1.010 1.005 - 1.030   pH 6.0 5.0 - 8.0   Glucose, UA >=500 (A) NEGATIVE mg/dL   Hgb urine dipstick LARGE (A) NEGATIVE   Bilirubin Urine NEGATIVE NEGATIVE   Ketones, ur NEGATIVE NEGATIVE mg/dL   Protein, ur 119 (A) NEGATIVE mg/dL   Nitrite NEGATIVE NEGATIVE   Leukocytes,Ua NEGATIVE NEGATIVE   Squamous Epithelial / HPF 0-5 0 - 5 /HPF   WBC, UA 0-5 0 - 5 WBC/hpf    Comment: Reflex urine culture not performed if WBC <=10, OR if Squamous epithelial cells >5. If Squamous epithelial cells >5, suggest recollection.   RBC / HPF 11-20 0 - 5 RBC/hpf   Bacteria, UA NONE SEEN NONE SEEN    Comment: Performed at [facility], [address] [place], [place] [address]  Resp panel by RT-PCR (RSV, Flu A&B, Covid) Anterior Nasal Swab     Status: None   Collection Time: 01/17/23  3:28 PM   Specimen: Anterior Nasal Swab  Result Value Ref Range   SARS Coronavirus 2 by RT PCR NEGATIVE NEGATIVE    Comment: (NOTE) SARS-CoV-2 target nucleic acids are NOT DETECTED.  The SARS-CoV-2 RNA is generally detectable in upper respiratory specimens during the acute phase of infection. The  lowest concentration of SARS-CoV-2 viral copies this assay can detect is 138 copies/mL. A negative result does not preclude SARS-Cov-2 infection and should not be used as the sole basis for treatment or other patient  management decisions. A negative result may occur with  improper specimen collection/handling, submission of specimen other than nasopharyngeal swab, presence of viral mutation(s) within the areas targeted by this assay, and inadequate number of viral copies(<138 copies/mL). A negative result must be combined with clinical observations, patient history, and epidemiological information. The expected result is Negative.  Fact Sheet for Patients:  BloggerCourse.com  Fact Sheet for Healthcare Providers:  SeriousBroker.it  This test is no t yet approved or cleared by the [place] FDA and  has been authorized for detection and/or diagnosis of SARS-CoV-2 by FDA under an Emergency Use Authorization (EUA). This EUA will remain  in effect (meaning this test can be used) for the duration of the COVID-19 declaration under Section 564(b)(1) of the Act, 21 U.S.C.section 360bbb-3(b)(1), unless the authorization is terminated  or revoked sooner.       Influenza A by PCR NEGATIVE NEGATIVE   Influenza B by PCR NEGATIVE NEGATIVE    Comment: (NOTE) The Xpert Xpress SARS-CoV-2/FLU/RSV plus assay is intended as an aid in the diagnosis of influenza from Nasopharyngeal swab specimens and should not be used as a sole basis for treatment. Nasal washings and aspirates are unacceptable for Xpert Xpress SARS-CoV-2/FLU/RSV testing.  Fact Sheet for Patients: BloggerCourse.com  Fact Sheet for Healthcare Providers: SeriousBroker.it  This test is not yet approved or cleared by the [place] FDA and has been authorized for detection and/or diagnosis of SARS-CoV-2 by FDA under an Emergency  Use Authorization (EUA). This EUA will remain in effect (meaning this test can be used) for the duration of the COVID-19 declaration under Section 564(b)(1) of the Act, 21 U.S.C. section 360bbb-3(b)(1), unless the authorization is terminated or revoked.     Resp Syncytial Virus by PCR NEGATIVE NEGATIVE    Comment: (NOTE) Fact Sheet for Patients: BloggerCourse.com  Fact Sheet for Healthcare Providers: SeriousBroker.it  This test is not yet approved or cleared by the [place] FDA and has been authorized for detection and/or diagnosis of SARS-CoV-2 by FDA under an Emergency Use Authorization (EUA). This EUA will remain in effect (meaning this test can be used) for the duration of the COVID-19 declaration under Section 564(b)(1) of the Act, 21 U.S.C. section 360bbb-3(b)(1), unless the authorization is terminated or revoked.  Performed at [facility], [address] [place], [place] [address]   Lactic acid, plasma     Status: None   Collection Time: 01/17/23  3:29 PM  Result Value Ref Range   Lactic Acid, Venous 1.2 0.5 - 1.9 mmol/L    Comment: Performed at [facility], [address] [place], [place] [address]   DG Tibia/Fibula Right Result Date: 01/17/2023 CLINICAL DATA:  Edema, soft tissue wounds, concern for osteomyelitis EXAM: RIGHT TIBIA AND FIBULA - 2 VIEW COMPARISON:  07/22/2016 FINDINGS: Frontal and lateral views of the right tibia and fibula are obtained. There is extensive soft tissue edema throughout the visualized right lower leg. No evidence of subcutaneous gas or radiopaque foreign body. There are no acute or destructive bony abnormalities. No radiographic evidence of osteomyelitis. 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 IMPRESSION: 1. Diffuse soft tissue edema. No subcutaneous gas, radiopaque foreign body, or radiographic  evidence of osteomyelitis. Electronically Signed   By: Sharlet Salina M.D.   On: 01/17/2023 16:45   DG Chest Portable 1 View Result Date: 01/17/2023 CLINICAL DATA:  Increasing weakness and fatigue for several weeks, sepsis  EXAM: PORTABLE CHEST 1 VIEW COMPARISON:  01/08/2023 FINDINGS: Single frontal view of the chest demonstrates stable enlargement of the cardiac silhouette. There is increased pulmonary vascular congestion without focal consolidation, effusion, or pneumothorax. No acute bony abnormalities. IMPRESSION: 1. Increased pulmonary vascular congestion without overt edema. 2. Stable enlarged cardiac silhouette. Electronically Signed   By: Sharlet Salina M.D.   On: 01/17/2023 16:43    Pending Labs Unresulted Labs (From admission, onward)     Start     Ordered   01/17/23 1529  Lactic acid, plasma  (Lactic Acid)  Now then every 2 hours,   R (with STAT occurrences)      01/17/23 1528   01/17/23 1527  Blood culture (routine x 2)  BLOOD CULTURE X 2,   STAT      01/17/23 1527            Vitals/Pain Today's Vitals   01/17/23 1645 01/17/23 1700 01/17/23 1715 01/17/23 1724  BP: (!) 135/96 137/78 (!) 128/91   Pulse: (!) 123 (!) 129 (!) 114   Resp: (!) 29 (!) 30 (!) 34   Temp:      TempSrc:      SpO2: 95% 96% 95%   Weight:      PainSc:    0-No pain    Isolation Precautions No active isolations  Medications Medications  acetaminophen (TYLENOL) tablet 1,000 mg (1,000 mg Oral Given 01/17/23 1626)  vancomycin (VANCOCIN) IVPB 1000 mg/200 mL premix (0 mg Intravenous Stopped 01/17/23 1810)    Followed by  vancomycin (VANCOCIN) IVPB 1000 mg/200 mL premix (1,000 mg Intravenous New Bag/Given 01/17/23 1811)  sodium chloride 0.9 % bolus 1,000 mL ( Intravenous Stopped 01/17/23 1700)  ceFEPIme (MAXIPIME) 2 g in sodium chloride 0.9 % 100 mL IVPB (0 g Intravenous Stopped 01/17/23 1700)  sodium chloride 0.9 % bolus 2,000 mL (2,000 mLs Intravenous New Bag/Given 01/17/23 1709)  potassium  chloride SA (KLOR-CON M) CR tablet 40 mEq (40 mEq Oral Given 01/17/23 1749)    Mobility walks with person assist     Focused Assessments     R Recommendations: See Admitting Provider Note  Report given to:   Additional Notes:

## 2023-01-17 NOTE — H&P (Signed)
History and Physical    Patient: Justin Molina. RUE:[id number] DOB: [birth year] DOA: 01/17/2023 DOS: the patient was seen and examined on 01/17/2023 PCP: Theodis Shove, DO  Patient coming from: Home  Chief Complaint:  Chief Complaint  Patient presents with   Weakness   HPI: Justin Molina. is a 62 y.o. 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  The patient's wife said the wound was the size of a nickel about a month ago and has rapidly grown.  The patient is has been having trouble with lots of swelling in that leg.The family is new to [place].  Within the last month the patient has started on a fluid pill and Ozempic and Januvia.  There has been a lot of change in his medication and the wound has grown in size very quickly.  The patient started having vomiting and fevers at home is what prompted the visit to the emergency department today.  She also reports that he was a little bit more out of it and confused when he had the fever. In the emergency department the patient was found to have a rectal temperature of 102.2 met criteria for sepsis on arrival. His right leg was erythematous and hot to the touch.  His chest x-ray and urinalysis were both negative for any sign of infection.  He was COVID and influenza negative.  The patient will be admitted for sepsis caused by right leg wound and cellulitis.   Review of Systems: As mentioned in the history of present illness. All other systems reviewed and are negative. Past Medical History:  Diagnosis Date   Chronic renal insufficiency    Diabetes mellitus without complication (HCC)    Hypertension    Stroke [facility])    Past Surgical History:  Procedure Laterality Date   BRAIN SURGERY      GASTROSTOMY TUBE PLACEMENT     HERNIA REPAIR     TRACHEOSTOMY     Social History:  reports that he has never smoked. He has never used smokeless tobacco. He reports that he does not drink alcohol and does not use drugs.  Allergies  Allergen Reactions   Contrast Media [Iodinated Contrast Media] Nausea And Vomiting and Swelling    History reviewed. No pertinent family history.  Prior to Admission medications   Medication Sig Start Date End Date Taking? Authorizing Provider  acetaminophen (TYLENOL) 500 MG tablet Take 1 tablet (500 mg total) by mouth every 6 (six) hours as needed. 12/26/14   Teressa Lower, NP  amLODipine (NORVASC) 5 MG tablet Take 1 tablet (5 mg total) by mouth daily. 11/17/21   Ellsworth Lennox, PA-C  atorvastatin (LIPITOR) 80 MG tablet Take 1 tablet (80 mg total) by mouth daily. 11/17/21   Ellsworth Lennox, PA-C  bisacodyl (DULCOLAX) 5 MG EC tablet Take 1 tablet (5 mg total) by mouth 2 (two) times daily. 11/17/21   Ellsworth Lennox, PA-C  blood glucose meter kit and supplies KIT Dispense based on patient and insurance preference. Use up to four times daily as directed. 11/17/21   Ellsworth Lennox, PA-C  Blood Glucose Monitoring Suppl (ACCU-CHEK AVIVA PLUS) W/DEVICE KIT Check blood sugar TID  & QHS 11/17/14   Ambrose Finland, NP  Blood Pressure Monitoring (BLOOD PRESSURE CUFF) MISC 1  Units by Does not apply route daily. 11/17/21   Ellsworth Lennox, PA-C  carvedilol (COREG) 25 MG tablet Take 1 tablet (25 mg total) by mouth 2 (two) times daily with a meal. Patient needs office visit for more refills 11/17/21   Ellsworth Lennox, PA-C  doxazosin (CARDURA) 8 MG tablet Take 1 tablet (8 mg total) by mouth daily. 11/17/21   Ellsworth Lennox, PA-C  ELIQUIS 5 MG TABS tablet Take 1 tablet (5 mg total) by mouth 2 (two) times daily. 11/17/21   Ellsworth Lennox, PA-C  glucose blood (ACCU-CHEK AVIVA PLUS) test strip Use as instructed for TID and QHS blood glucose testing 05/23/15   Quentin Angst, MD  hydrALAZINE  (APRESOLINE) 100 MG tablet Take 1 tablet (100 mg total) by mouth 3 (three) times daily. 11/17/21   Ellsworth Lennox, PA-C  insulin detemir (LEVEMIR) 100 UNIT/ML injection Inject 6 Units into the skin at bedtime.    [provider]  isosorbide mononitrate (IMDUR) 120 MG 24 hr tablet Take 1 tablet (120 mg total) by mouth daily. 11/17/21   Ellsworth Lennox, PA-C  Lancets 28G MISC Check blood sugar TID & QHS 05/23/15   Quentin Angst, MD  loperamide (IMODIUM) 2 MG capsule Take 1 capsule (2 mg total) by mouth 4 (four) times daily as needed for diarrhea or loose stools. 01/06/22   Gailen Shelter, PA  Misc. Devices (BATHTUB SAFETY RAIL) MISC Patient needs safety rails for shower 01/04/15   Holland Commons A, NP  MULTAQ 400 MG tablet Take 1 tablet (400 mg total) by mouth 2 (two) times daily. 11/17/21   Ellsworth Lennox, PA-C  ondansetron (ZOFRAN-ODT) 4 MG disintegrating tablet Take 1 tablet (4 mg total) by mouth every 8 (eight) hours as needed for nausea or vomiting. 01/06/22   Gailen Shelter, PA  potassium chloride (KLOR-CON M) 10 MEQ tablet Take 1 tablet (10 mEq total) by mouth daily. 11/17/21   Ellsworth Lennox, PA-C  predniSONE (DELTASONE) 10 MG tablet Take 2 tablets (20 mg total) by mouth daily. 02/04/22   Wynetta Fines, MD  traMADol (ULTRAM) 50 MG tablet Take 50 mg by mouth every 6 (six) hours as needed for moderate pain or severe pain.    [provider]    Physical Exam: Vitals:   01/17/23 1800 01/17/23 1830 01/17/23 1900 01/17/23 2116  BP: (!) 150/94  136/83 (!) 140/83  Pulse: (!) 116 75 78 72  Resp: (!) 31 (!) 29 (!) 23   Temp:  98.9 F (37.2 C)  97.7 F (36.5 C)  TempSrc:  Oral  Oral  SpO2: 95% 97% 95% 97%  Weight:       Physical Exam:  General: No acute distress, well developed, obese HEENT: Normocephalic, atraumatic, PERRL Cardiovascular: Normal rate and rhythm. Distal pulses intact. Pulmonary: Normal pulmonary effort, normal breath sounds Gastrointestinal: Nondistended  abdomen, soft, non-tender, normoactive bowel sounds Musculoskeletal:Normal ROM, right lower ext edema, right calf is bandaged Skin: Skin is warm and dry. Neuro: aphasia, right hand contracted, right hemi paresis, Alert, follows commands PSYCH: Attentive and cooperative  Data Reviewed:  Results for orders placed or performed during the hospital encounter of 01/17/23 (from the past 24 hours)  CBC with Differential     Status: Abnormal   Collection Time: 01/17/23  3:27 PM  Result Value Ref Range   WBC 10.6 (H) 4.0 - 10.5 K/uL   RBC 4.46 4.22 - 5.81 MIL/uL   Hemoglobin 12.1 (L) 13.0 - 17.0 g/dL   HCT 16.1 (  L) 39.0 - 52.0 %   MCV 81.8 80.0 - 100.0 fL   MCH 27.1 26.0 - 34.0 pg   MCHC 33.2 30.0 - 36.0 g/dL   RDW 13.0 86.5 - 78.4 %   Platelets 292 150 - 400 K/uL   nRBC 0.0 0.0 - 0.2 %   Neutrophils Relative % 80 %   Neutro Abs 8.4 (H) 1.7 - 7.7 K/uL   Lymphocytes Relative 11 %   Lymphs Abs 1.2 0.7 - 4.0 K/uL   Monocytes Relative 8 %   Monocytes Absolute 0.9 0.1 - 1.0 K/uL   Eosinophils Relative 1 %   Eosinophils Absolute 0.1 0.0 - 0.5 K/uL   Basophils Relative 0 %   Basophils Absolute 0.0 0.0 - 0.1 K/uL   Immature Granulocytes 0 %   Abs Immature Granulocytes 0.04 0.00 - 0.07 K/uL  Comprehensive metabolic panel     Status: Abnormal   Collection Time: 01/17/23  3:27 PM  Result Value Ref Range   Sodium 139 135 - 145 mmol/L   Potassium 3.3 (L) 3.5 - 5.1 mmol/L   Chloride 104 98 - 111 mmol/L   CO2 25 22 - 32 mmol/L   Glucose, Bld 101 (H) 70 - 99 mg/dL   BUN 22 8 - 23 mg/dL   Creatinine, Ser 6.96 (H) 0.61 - 1.24 mg/dL   Calcium 7.9 (L) 8.9 - 10.3 mg/dL   Total Protein 8.6 (H) 6.5 - 8.1 g/dL   Albumin 3.1 (L) 3.5 - 5.0 g/dL   AST 24 15 - 41 U/L   ALT 14 0 - 44 U/L   Alkaline Phosphatase 60 38 - 126 U/L   Total Bilirubin 0.8 <1.2 mg/dL   GFR, Estimated 34 (L) >60 mL/min   Anion gap 10 5 - 15  Urinalysis, w/ Reflex to Culture (Infection Suspected) -Urine, Clean Catch     Status:  Abnormal   Collection Time: 01/17/23  3:27 PM  Result Value Ref Range   Specimen Source URINE, CLEAN CATCH    Color, Urine YELLOW YELLOW   APPearance CLEAR CLEAR   Specific Gravity, Urine 1.010 1.005 - 1.030   pH 6.0 5.0 - 8.0   Glucose, UA >=500 (A) NEGATIVE mg/dL   Hgb urine dipstick LARGE (A) NEGATIVE   Bilirubin Urine NEGATIVE NEGATIVE   Ketones, ur NEGATIVE NEGATIVE mg/dL   Protein, ur 295 (A) NEGATIVE mg/dL   Nitrite NEGATIVE NEGATIVE   Leukocytes,Ua NEGATIVE NEGATIVE   Squamous Epithelial / HPF 0-5 0 - 5 /HPF   WBC, UA 0-5 0 - 5 WBC/hpf   RBC / HPF 11-20 0 - 5 RBC/hpf   Bacteria, UA NONE SEEN NONE SEEN  Resp panel by RT-PCR (RSV, Flu A&B, Covid) Anterior Nasal Swab     Status: None   Collection Time: 01/17/23  3:28 PM   Specimen: Anterior Nasal Swab  Result Value Ref Range   SARS Coronavirus 2 by RT PCR NEGATIVE NEGATIVE   Influenza A by PCR NEGATIVE NEGATIVE   Influenza B by PCR NEGATIVE NEGATIVE   Resp Syncytial Virus by PCR NEGATIVE NEGATIVE  Lactic acid, plasma     Status: None   Collection Time: 01/17/23  3:29 PM  Result Value Ref Range   Lactic Acid, Venous 1.2 0.5 - 1.9 mmol/L  Glucose, capillary     Status: Abnormal   Collection Time: 01/17/23  9:32 PM  Result Value Ref Range   Glucose-Capillary 101 (H) 70 - 99 mg/dL     Assessment and Plan:  Right leg wound with cellulitis Sepsis due to cellulits - IV Vancomycin and Cefepime - Wound care consult for recommnedations onlarge wound - Zinc and Vit C  2. DM type 2 - Hold Ozempic while hospitalized - Continue Januvia - Correction dose insulin  3. H/o Afib - Cont. Eliquis   4. H/o CVA with residual deficits- The patient has aphasia. He has right heiparesis and walks with a cane at baseline.      Advance Care Planning:   Code Status: Full Code    Consults: wound care  Family Communication: The patient's wife is at bedside  Severity of Illness: The appropriate patient status for this patient is  INPATIENT. Inpatient status is judged to be reasonable and necessary in order to provide the required intensity of service to ensure the patient's safety. The patient's presenting symptoms, physical exam findings, and initial radiographic and laboratory data in the context of their chronic comorbidities is felt to place them at high risk for further clinical deterioration. Furthermore, it is not anticipated that the patient will be medically stable for discharge from the hospital within 2 midnights of admission.   * I certify that at the point of admission it is my clinical judgment that the patient will require inpatient hospital care spanning beyond 2 midnights from the point of admission due to high intensity of service, high risk for further deterioration and high frequency of surveillance required.*  Author: Buena Irish, MD 01/17/2023 10:34 PM  For on call review [email or web address].

## 2023-01-17 NOTE — ED Notes (Signed)
Patient's right leg was propped up with 2 pillows. Noted drainage from wound(s)

## 2023-01-17 NOTE — Sepsis Progress Note (Signed)
Sepsis protocol monitored by eLink ?

## 2023-01-17 NOTE — Progress Notes (Signed)
ED Pharmacy Antibiotic Sign Off An antibiotic consult was received from an ED provider for cefepime and vancomycin per pharmacy dosing for sepsis. A chart review was completed to assess appropriateness.  A single dose of cefepime placed by the ED provider.   The following one time order(s) were placed per pharmacy consult:  vancomycin 2000 mg x 1 dose  Further antibiotic and/or antibiotic pharmacy consults should be ordered by the admitting provider if indicated.   Thank you for allowing pharmacy to be a part of this patient's care.   Delmar Landau, PharmD, BCPS 01/17/2023 4:50 PM ED Clinical Pharmacist -  [phone]

## 2023-01-17 NOTE — Progress Notes (Signed)
TRH Transfer Acceptance Note I am the number tomorrow [id number] Transferring MD: Dr. Lovie Macadamia, ED resident Transferring facility: [facility] ED Accepting facility: [facility], progressive unit, inpatient bed  Admitting diagnosis: Sepsis due to infected right lower extremity wound. AoCKD  62 year old married male, medical history significant for type II DM/IDDM, HTN, HLD, CVA with residual right hemiparesis, CKD stage III, chronic right lower extremity wound, presented to the ED 12/12 with 24 hours history of fever, increasing right leg edema with pain around the wound. Recently seen in ED on 12/3 for generalized weakness, fatigue, lethargy and increased somnolence, creatinine 1.98, troponins negative, UA without concerns for UTI,.  On arrival febrile to 102.2 F rectally, tachycardic to 122/min, normotensive, not hypoxic.  Mildly tachypneic with respiratory rates of 30-34. Lab work significant for potassium of 3.3, creatinine of 2.17 (baseline may be in the 1.6-1.7 range but creatinine has been fluctuating), lactate 1.2.  WBC 10.6.  Influenza, RSV and COVID testing negative.  Urine microscopy not indicative of UTI.  Blood cultures drawn and pending.  Chest x-ray shows increased pulmonary congestion without overt edema.  X-ray of right tibia and fibula shows diffuse soft tissue edema without gas, foreign body or osteomyelitis  Exam: RLE wound as in pic below..Reportedly draining some purulence    Treated per sepsis protocol, received 3 L IV fluid bolus, empiric IV cefepime and vancomycin x 1 dose.  Per report, patient is alert and oriented, stable for progressive unit.  Marcellus Scott, MD,  FACP, [facility], [facility], [facility]   [facility] [place]     To contact the attending provider between 7A-7P or the covering provider during after hours 7P-7A, please log into the web site [email or web address] and access using universal [place] password  for that web site. If you do not have the password, please call the hospital operator.

## 2023-01-17 NOTE — ED Notes (Signed)
Patient enroute to [facility] at this time.

## 2023-01-17 NOTE — ED Provider Notes (Signed)
[facility] Provider Note   CSN: [id number] Arrival date & time: 01/17/23  1509     History  Chief Complaint  Patient presents with   Weakness    Justin Molina. is a 62 y.o. male gentleman with pertinent past medical history of CKD, chronic edema, stroke with residual right-sided weakness, and right lower extremity wound.  He was recently seen on 01/18/2023 in the emergency department for generalized weakness and fatigue.  At that time he was lethargic and increasingly somnolent.  At this time of ED presentation his creatinine was 1.98, GFR 37, BNP 326 globin 12.8.  Troponins were negative and UA did not show concerns for UTI.  DVT study was conducted which was also negative.  Chest x-ray showed stable cardiomegaly.  Overall his workup was largely unremarkable, and he was discharged in stable condition.  He is doing relatively well until yesterday when he gradually got weaker.  Over the last 24 hours he developed a fever, but denies any other symptoms such as nausea, vomiting, cough, sneeze, shortness of breath, sinus pressure, dysuria, frequency, suprapubic pain, diarrhea, constipation, hematemesis, or hematochezia.  He does have increasing right leg edema and pain around the area of the wound.  The wound has also increased in size.    Weakness Associated symptoms: fever   Associated symptoms: no abdominal pain, no chest pain, no diarrhea, no dysuria, no nausea, no shortness of breath and no vomiting        Home Medications Prior to Admission medications   Medication Sig Start Date End Date Taking? Authorizing Provider  acetaminophen (TYLENOL) 500 MG tablet Take 1 tablet (500 mg total) by mouth every 6 (six) hours as needed. 12/26/14   Teressa Lower, NP  amLODipine (NORVASC) 5 MG tablet Take 1 tablet (5 mg total) by mouth daily. 11/17/21   Ellsworth Lennox, PA-C  atorvastatin (LIPITOR) 80 MG tablet Take 1 tablet (80 mg total) by  mouth daily. 11/17/21   Ellsworth Lennox, PA-C  bisacodyl (DULCOLAX) 5 MG EC tablet Take 1 tablet (5 mg total) by mouth 2 (two) times daily. 11/17/21   Ellsworth Lennox, PA-C  blood glucose meter kit and supplies KIT Dispense based on patient and insurance preference. Use up to four times daily as directed. 11/17/21   Ellsworth Lennox, PA-C  Blood Glucose Monitoring Suppl (ACCU-CHEK AVIVA PLUS) W/DEVICE KIT Check blood sugar TID  & QHS 11/17/14   Ambrose Finland, NP  Blood Pressure Monitoring (BLOOD PRESSURE CUFF) MISC 1 Units by Does not apply route daily. 11/17/21   Ellsworth Lennox, PA-C  carvedilol (COREG) 25 MG tablet Take 1 tablet (25 mg total) by mouth 2 (two) times daily with a meal. Patient needs office visit for more refills 11/17/21   Ellsworth Lennox, PA-C  doxazosin (CARDURA) 8 MG tablet Take 1 tablet (8 mg total) by mouth daily. 11/17/21   Ellsworth Lennox, PA-C  ELIQUIS 5 MG TABS tablet Take 1 tablet (5 mg total) by mouth 2 (two) times daily. 11/17/21   Ellsworth Lennox, PA-C  glucose blood (ACCU-CHEK AVIVA PLUS) test strip Use as instructed for TID and QHS blood glucose testing 05/23/15   Quentin Angst, MD  hydrALAZINE (APRESOLINE) 100 MG tablet Take 1 tablet (100 mg total) by mouth 3 (three) times daily. 11/17/21   Ellsworth Lennox, PA-C  insulin detemir (LEVEMIR) 100 UNIT/ML injection Inject 6 Units into the skin at bedtime.    [provider]  isosorbide mononitrate (IMDUR) 120  MG 24 hr tablet Take 1 tablet (120 mg total) by mouth daily. 11/17/21   Ellsworth Lennox, PA-C  Lancets 28G MISC Check blood sugar TID & QHS 05/23/15   Quentin Angst, MD  loperamide (IMODIUM) 2 MG capsule Take 1 capsule (2 mg total) by mouth 4 (four) times daily as needed for diarrhea or loose stools. 01/06/22   Gailen Shelter, PA  Misc. Devices (BATHTUB SAFETY RAIL) MISC Patient needs safety rails for shower 01/04/15   Holland Commons A, NP  MULTAQ 400 MG tablet Take 1 tablet (400 mg total) by mouth 2 (two) times daily.  11/17/21   Ellsworth Lennox, PA-C  ondansetron (ZOFRAN-ODT) 4 MG disintegrating tablet Take 1 tablet (4 mg total) by mouth every 8 (eight) hours as needed for nausea or vomiting. 01/06/22   Gailen Shelter, PA  potassium chloride (KLOR-CON M) 10 MEQ tablet Take 1 tablet (10 mEq total) by mouth daily. 11/17/21   Ellsworth Lennox, PA-C  predniSONE (DELTASONE) 10 MG tablet Take 2 tablets (20 mg total) by mouth daily. 02/04/22   Wynetta Fines, MD  traMADol (ULTRAM) 50 MG tablet Take 50 mg by mouth every 6 (six) hours as needed for moderate pain or severe pain.    [provider]      Allergies    Contrast media [iodinated contrast media]    Review of Systems   Review of Systems  Constitutional:  Positive for activity change, fatigue and fever.  Respiratory:  Negative for shortness of breath and wheezing.   Cardiovascular:  Positive for leg swelling. Negative for chest pain.  Gastrointestinal:  Negative for abdominal pain, blood in stool, constipation, diarrhea, nausea and vomiting.  Genitourinary:  Negative for dysuria.  Skin:  Positive for wound.  Neurological:  Positive for weakness.    Physical Exam Updated Vital Signs BP 136/83   Pulse 78   Temp 98.9 F (37.2 C) (Oral)   Resp (!) 23   Wt 132 kg   SpO2 95%   BMI 40.59 kg/m  Physical Exam Constitutional:      Appearance: He is ill-appearing.  Cardiovascular:     Rate and Rhythm: Regular rhythm. Tachycardia present.     Pulses: Normal pulses.     Heart sounds: Normal heart sounds.  Pulmonary:     Effort: Pulmonary effort is normal.     Breath sounds: Normal breath sounds.  Abdominal:     General: Abdomen is flat. There is no distension.     Tenderness: There is no abdominal tenderness.  Musculoskeletal:     Comments: Chronic left lower extremity edema, near baseline per family. Increase edema of the right lower extremity with a large wound of the lateral aspect of the shin.  The wound does have some drainage and  possible purulence.  There is increased edema and erythema surrounding the wound.  There is pain with palpation of the surrounding area.     ED Results / Procedures / Treatments   Labs (all labs ordered are listed, but only abnormal results are displayed) Labs Reviewed  CBC WITH DIFFERENTIAL/PLATELET - Abnormal; Notable for the following components:      Result Value   WBC 10.6 (*)    Hemoglobin 12.1 (*)    HCT 36.5 (*)    Neutro Abs 8.4 (*)    All other components within normal limits  COMPREHENSIVE METABOLIC PANEL - Abnormal; Notable for the following components:   Potassium 3.3 (*)    Glucose, Bld 101 (*)  Creatinine, Ser 2.17 (*)    Calcium 7.9 (*)    Total Protein 8.6 (*)    Albumin 3.1 (*)    GFR, Estimated 34 (*)    All other components within normal limits  URINALYSIS, W/ REFLEX TO CULTURE (INFECTION SUSPECTED) - Abnormal; Notable for the following components:   Glucose, UA >=500 (*)    Hgb urine dipstick LARGE (*)    Protein, ur 100 (*)    All other components within normal limits  RESP PANEL BY RT-PCR (RSV, FLU A&B, COVID)  [id number]  CULTURE, BLOOD (ROUTINE X 2)  CULTURE, BLOOD (ROUTINE X 2)  LACTIC ACID, PLASMA  LACTIC ACID, PLASMA    EKG None  Radiology DG Tibia/Fibula Right Result Date: 01/17/2023 CLINICAL DATA:  Edema, soft tissue wounds, concern for osteomyelitis EXAM: RIGHT TIBIA AND FIBULA - 2 VIEW COMPARISON:  07/22/2016 FINDINGS: Frontal and lateral views of the right tibia and fibula are obtained. There is extensive soft tissue edema throughout the visualized right lower leg. No evidence of subcutaneous gas or radiopaque foreign body. There are no acute or destructive bony abnormalities. No radiographic evidence of osteomyelitis. 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 IMPRESSION: 1. Diffuse soft tissue edema. No subcutaneous gas, radiopaque foreign body, or radiographic evidence of  osteomyelitis. Electronically Signed   By: Sharlet Salina M.D.   On: 01/17/2023 16:45   DG Chest Portable 1 View Result Date: 01/17/2023 CLINICAL DATA:  Increasing weakness and fatigue for several weeks, sepsis EXAM: PORTABLE CHEST 1 VIEW COMPARISON:  01/08/2023 FINDINGS: Single frontal view of the chest demonstrates stable enlargement of the cardiac silhouette. There is increased pulmonary vascular congestion without focal consolidation, effusion, or pneumothorax. No acute bony abnormalities. IMPRESSION: 1. Increased pulmonary vascular congestion without overt edema. 2. Stable enlarged cardiac silhouette. Electronically Signed   By: Sharlet Salina M.D.   On: 01/17/2023 16:43    Procedures Procedures    Medications Ordered in ED Medications  acetaminophen (TYLENOL) tablet 1,000 mg (1,000 mg Oral Given 01/17/23 1626)  sodium chloride 0.9 % bolus 1,000 mL ( Intravenous Stopped 01/17/23 1700)  ceFEPIme (MAXIPIME) 2 g in sodium chloride 0.9 % 100 mL IVPB (0 g Intravenous Stopped 01/17/23 1700)  vancomycin (VANCOCIN) IVPB 1000 mg/200 mL premix (0 mg Intravenous Stopped 01/17/23 1810)    Followed by  vancomycin (VANCOCIN) IVPB 1000 mg/200 mL premix (0 mg Intravenous Stopped 01/17/23 1910)  sodium chloride 0.9 % bolus 2,000 mL ( Intravenous Stopped 01/17/23 1911)  potassium chloride SA (KLOR-CON M) CR tablet 40 mEq (40 mEq Oral Given 01/17/23 1749)    ED Course/ Medical Decision Making/ A&P                                 Medical Decision Making This is a chronically ill 62 year old gentleman presenting with signs and symptoms concerning for sepsis in the setting of cellulitis.  He has a known right lower extremity wound has gotten progressively more edematous, erythematous, larger, more painful.  He is febrile today as well as tachycardic, tachypneic, with well-maintained blood pressures.  The most likely source of his sepsis would be the right lower extremity wound, denies symptoms to make me  think pneumonia, viral URI, or UTI.  Lactic acid 1.2 at time of admission.  CMP demonstrating hypokalemia to 3.3, creatinine increased to 2.17, white cell count 10.6 with a neutrophil predominance.  Hemoglobin demonstrating stable anemia.  Glucose greater than 500 on UA-on SGLT2.  Hemoglobin dipstick with blood and protein without nitrites or leukocytes.  RBCs seen on microscopy.  Unsure why he has hematuria and proteinuria-though upon chart review this seems to be ongoing for him.  Normal saline bolus begun.  We will go ahead and begin broad-spectrum coverage with vancomycin and cefepime. Blood cultures drawn and pending.  Respiratory pending.  Will go ahead and obtain a chest x-ray as well as tib-fib radiograph of the right lower extremity to try and further rule out pneumonia and osteomyelitis.  Wife at bedside and informed of what is going on.  She would like to make sure she is able to have transportation to the hospital when he is transferred for admission.  I was able to speak with the accepting provider at [facility] around 1745.  I will go ahead and replete potassium orally.  Upon reassessment, patient feeling slightly better.  In no acute distress at this time, though still tachypneic.  Chest x-ray with stable cardiomegaly, increased vascular congestion without overt edema.  Tib-fib x-rays unremarkable with no signs of advanced osteomyelitis.  I independently reviewed imaging and agree with this.  Respiratory panel negative.  Given constellation of signs and symptoms as well as workup, leading diagnosis would be sepsis in setting of cellulitis/wound infection of the right lower extremity.  Photos of the wound at time of presentation in the media tab.  He will receive a total of 3 L normal saline while here, and has been given vancomycin and cefepime.  Blood cultures drawn.  Lactate within normal limits, will repeat.  Difficult to tell what his creatinine baseline is, but may be as low as  1.5-1.6.  Amount and/or Complexity of Data Reviewed Labs: ordered. Radiology: ordered.  Risk OTC drugs. Prescription drug management. Decision regarding hospitalization.           Final Clinical Impression(s) / ED Diagnoses Final diagnoses:  Sepsis with acute organ dysfunction without septic shock, due to unspecified organism, unspecified organ dysfunction type [facility])    Rx / DC Orders ED Discharge Orders     None         Lovie Macadamia, MD 01/17/23 1610    Glyn Ade, MD 01/18/23 1507    Glyn Ade, MD 01/18/23 [phone]

## 2023-01-17 NOTE — ED Triage Notes (Signed)
Pt BIB EMS for increasing weakness and fatigue for the past few weeks. Pt seen for the same a week ago and discharged. Pt has hx of CVA and does not ambulate. Per EMS, pt has had poor PO intake over the last few days. Pt as had right sided deficits from previous stroke.   148/80 90 98%  CBG 124

## 2023-01-17 NOTE — ED Notes (Signed)
Called floor for report, no answer. Will attempt again.

## 2023-01-17 NOTE — ED Notes (Signed)
Per pts wife he had a stroke and has deficits on the rt side and asphasia , some time ago . Pt has an ulcer and cellulitis right lower leg  also,now, for the past 3 days pt has had decreasing oral intake and weakness, per wife pt was started on new med for diabetes and lasix , pt has had increased urine out put, had dr appointment today but she did not feel pt  could make it , he does ambulate with a sturdy walker

## 2023-01-18 DIAGNOSIS — R6520 Severe sepsis without septic shock: Secondary | ICD-10-CM | POA: Diagnosis not present

## 2023-01-18 DIAGNOSIS — A419 Sepsis, unspecified organism: Secondary | ICD-10-CM | POA: Diagnosis not present

## 2023-01-18 LAB — CBC
HCT: 34.9 % — ABNORMAL LOW (ref 39.0–52.0)
Hemoglobin: 10.9 g/dL — ABNORMAL LOW (ref 13.0–17.0)
MCH: 26.7 pg (ref 26.0–34.0)
MCHC: 31.2 g/dL (ref 30.0–36.0)
MCV: 85.3 fL (ref 80.0–100.0)
Platelets: 292 10*3/uL (ref 150–400)
RBC: 4.09 MIL/uL — ABNORMAL LOW (ref 4.22–5.81)
RDW: 13.9 % (ref 11.5–15.5)
WBC: 9.3 10*3/uL (ref 4.0–10.5)
nRBC: 0 % (ref 0.0–0.2)

## 2023-01-18 LAB — GLUCOSE, CAPILLARY
Glucose-Capillary: 137 mg/dL — ABNORMAL HIGH (ref 70–99)
Glucose-Capillary: 99 mg/dL (ref 70–99)
Glucose-Capillary: 120 mg/dL — ABNORMAL HIGH (ref 70–99)
Glucose-Capillary: 103 mg/dL — ABNORMAL HIGH (ref 70–99)

## 2023-01-18 LAB — BASIC METABOLIC PANEL
Anion gap: 8 (ref 5–15)
BUN: 18 mg/dL (ref 8–23)
CO2: 24 mmol/L (ref 22–32)
Calcium: 7.5 mg/dL — ABNORMAL LOW (ref 8.9–10.3)
Chloride: 107 mmol/L (ref 98–111)
Creatinine, Ser: 1.88 mg/dL — ABNORMAL HIGH (ref 0.61–1.24)
GFR, Estimated: 40 mL/min — ABNORMAL LOW (ref >60)
Glucose, Bld: 86 mg/dL (ref 70–99)
Potassium: 3.2 mmol/L — ABNORMAL LOW (ref 3.5–5.1)
Sodium: 139 mmol/L (ref 135–145)

## 2023-01-18 LAB — HEMOGLOBIN A1C
Hgb A1c MFr Bld: 7.5 % — ABNORMAL HIGH (ref 4.8–5.6)
Mean Plasma Glucose: 168.55 mg/dL

## 2023-01-18 LAB — HIV ANTIBODY (ROUTINE TESTING W REFLEX): HIV Screen 4th Generation wRfx: NONREACTIVE

## 2023-01-18 LAB — TSH: TSH: 0.893 u[IU]/mL (ref 0.350–4.500)

## 2023-01-18 MED ORDER — ALBUTEROL SULFATE (2.5 MG/3ML) 0.083% IN NEBU: 2.5000 mg | INHALATION_SOLUTION | Freq: Four times a day (QID) | RESPIRATORY_TRACT | Status: DC | PRN

## 2023-01-18 MED ORDER — CARVEDILOL 25 MG PO TABS
25.0000 mg | ORAL_TABLET | Freq: Two times a day (BID) | ORAL | Status: DC
  Administered 2023-01-18 – 2023-01-25 (×15): 25 mg via ORAL
  Filled 2023-01-18 (×15): qty 1

## 2023-01-18 MED ORDER — FUROSEMIDE 10 MG/ML IJ SOLN
40.0000 mg | Freq: Once | INTRAMUSCULAR | Status: AC
Start: 2023-01-18 — End: 2023-01-18
  Administered 2023-01-18: 40 mg via INTRAVENOUS
  Filled 2023-01-18: qty 4

## 2023-01-18 MED ORDER — VANCOMYCIN HCL 1250 MG/250ML IV SOLN
1250.0000 mg | INTRAVENOUS | Status: DC
  Administered 2023-01-18: 1250 mg via INTRAVENOUS
  Filled 2023-01-18: qty 250

## 2023-01-18 MED ORDER — ATORVASTATIN CALCIUM 40 MG PO TABS
80.0000 mg | ORAL_TABLET | Freq: Every day | ORAL | Status: DC
  Administered 2023-01-18 – 2023-01-20 (×3): 80 mg via ORAL
  Filled 2023-01-18 (×3): qty 2

## 2023-01-18 MED ORDER — VANCOMYCIN HCL 1750 MG/350ML IV SOLN: 1750.0000 mg | INTRAVENOUS | Status: DC

## 2023-01-18 MED ORDER — SODIUM CHLORIDE 0.9 % IV SOLN
2.0000 g | Freq: Two times a day (BID) | INTRAVENOUS | Status: DC
  Administered 2023-01-18 – 2023-01-19 (×3): 2 g via INTRAVENOUS
  Filled 2023-01-18 (×3): qty 12.5

## 2023-01-18 MED ORDER — GLUCERNA SHAKE PO LIQD
237.0000 mL | Freq: Two times a day (BID) | ORAL | Status: DC
  Administered 2023-01-18 – 2023-01-22 (×8): 237 mL via ORAL
  Filled 2023-01-18 (×10): qty 237

## 2023-01-18 MED ORDER — INSULIN ASPART 100 UNIT/ML IJ SOLN
0.0000 [IU] | Freq: Three times a day (TID) | INTRAMUSCULAR | Status: DC
  Administered 2023-01-19 – 2023-01-20 (×4): 2 [IU] via SUBCUTANEOUS

## 2023-01-18 MED ORDER — ADULT MULTIVITAMIN W/MINERALS CH
1.0000 | ORAL_TABLET | Freq: Every day | ORAL | Status: DC
  Administered 2023-01-18 – 2023-01-25 (×8): 1 via ORAL
  Filled 2023-01-18 (×8): qty 1

## 2023-01-18 MED ORDER — IPRATROPIUM-ALBUTEROL 0.5-2.5 (3) MG/3ML IN SOLN
3.0000 mL | Freq: Four times a day (QID) | RESPIRATORY_TRACT | Status: DC
  Administered 2023-01-18 (×2): 3 mL via RESPIRATORY_TRACT
  Filled 2023-01-18 (×2): qty 3

## 2023-01-18 MED ORDER — APIXABAN 5 MG PO TABS
5.0000 mg | ORAL_TABLET | Freq: Two times a day (BID) | ORAL | Status: DC
  Administered 2023-01-18 – 2023-01-25 (×15): 5 mg via ORAL
  Filled 2023-01-18 (×15): qty 1

## 2023-01-18 MED ORDER — ISOSORBIDE MONONITRATE ER 60 MG PO TB24
120.0000 mg | ORAL_TABLET | Freq: Every day | ORAL | Status: DC
  Administered 2023-01-18 – 2023-01-25 (×8): 120 mg via ORAL
  Filled 2023-01-18 (×8): qty 2

## 2023-01-18 MED ORDER — POTASSIUM CHLORIDE CRYS ER 20 MEQ PO TBCR
20.0000 meq | EXTENDED_RELEASE_TABLET | Freq: Once | ORAL | Status: AC
  Administered 2023-01-18: 20 meq via ORAL
  Filled 2023-01-18: qty 1

## 2023-01-18 MED ORDER — INSULIN ASPART 100 UNIT/ML IJ SOLN: 0.0000 [IU] | Freq: Every day | INTRAMUSCULAR | Status: DC

## 2023-01-18 MED ORDER — EMPAGLIFLOZIN 10 MG PO TABS: 10.0000 mg | ORAL_TABLET | Freq: Every day | ORAL | Status: DC

## 2023-01-18 NOTE — TOC Initial Note (Signed)
Transition of Care (TOC) - Initial/Assessment Note    Patient Details  Name: Justin Molina. MRN: [id number] Date of Birth: 1961/01/23  Transition of Care [facility]) CM/SW Contact:    Lanier Clam, RN Phone Number: 01/18/2023, 4:41 PM  Clinical Narrative:  Spoke to spouse Archie Patten about d/c plans prefer home but will explore recc of ST SNF if recc. Please place PT/OT cons. Currently active [employer]-[facility]/PT/OT/social work.-contact on weekend [facility] [phone]. [employer] rep Mitch recc if lift chair needed to come to retail store-Tonya voiced understanding.Patient already has hospital bed, can 4 prong, 3n1. Wound care center f/u.Will need PTAR @ d/c.                 Expected Discharge Plan: Home w Home Health Services Barriers to Discharge: Continued Medical Work up   Patient Goals and CMS Choice            Expected Discharge Plan and Services                                              Prior Living Arrangements/Services                       Activities of Daily Living   ADL Screening (condition at time of admission) Independently performs ADLs?: No Does the patient have a NEW difficulty with bathing/dressing/toileting/self-feeding that is expected to last >3 days?: No Does the patient have a NEW difficulty with getting in/out of bed, walking, or climbing stairs that is expected to last >3 days?: No Does the patient have a NEW difficulty with communication that is expected to last >3 days?: No Is the patient deaf or have difficulty hearing?: No Does the patient have difficulty seeing, even when wearing glasses/contacts?: No Does the patient have difficulty concentrating, remembering, or making decisions?: Yes  Permission Sought/Granted                  Emotional Assessment              Admission diagnosis:  Sepsis (HCC) [A41.9] Sepsis with acute organ dysfunction without septic shock, due to unspecified organism,  unspecified organ dysfunction type (HCC) [A41.9, R65.20] Patient Active Problem List   Diagnosis Date Noted   Sepsis (HCC) 01/17/2023   Cellulitis 01/17/2023   Unspecified dementia, unspecified severity, without behavioral disturbance, psychotic disturbance, mood disturbance, and anxiety (HCC) 05/01/2021   Secondary hyperparathyroidism of renal origin (HCC) 02/01/2021   Hemiplegia and hemiparesis following cerebral infarction affecting right dominant side (HCC) 01/04/2021   Aphasia following cerebral infarction 01/04/2021   Unspecified atrial fibrillation (HCC) 01/04/2021   Class 3 severe obesity due to excess calories with body mass index (BMI) of 45.0 to 49.9 in adult (HCC) 10/03/2017   OSA (obstructive sleep apnea) 01/16/2016   CKD (chronic kidney disease) stage 3, GFR 30-59 ml/min (HCC) 08/24/2014   HLD (hyperlipidemia) 08/24/2014   Anxiety 08/24/2014   DM (diabetes mellitus), type 2 with renal complications (HCC) 08/12/2014   Diabetic neuropathy (HCC) 08/12/2014   HTN (hypertension) 08/12/2014   History of CVA with residual deficit 08/12/2014   PCP:  Theodis Shove, DO Pharmacy:   [id number] - [place], [place] - [address] [place] [address] Phone: [phone] Fax: [phone]  [id number] - [place],  - [address] [place] [address] Phone: [phone] Fax: [phone]     Social Drivers of Health (SDOH) Social History: SDOH Screenings   Food Insecurity: No Food Insecurity (01/17/2023)  Housing: Low Risk  (01/18/2023)  Transportation Needs: No Transportation Needs (01/17/2023)  Utilities: Not At Risk (01/17/2023)  Tobacco Use: Low Risk  (01/17/2023)   SDOH Interventions:     Readmission Risk Interventions     No data to display

## 2023-01-18 NOTE — Consult Note (Addendum)
WOC Nurse Consult Note: Reason for Consult: Requested to assess RLE ulcer. Perfomed remotely after photo and notes. Wound type: Chronic venous ulcer, treated for sepsis.  Measurement: aprox. 8x10cm Wound bed: 80% dark red, 20% yellow Drainage (amount, consistency, odor) High amount Periwound: skin maceration Dressing procedure/placement/frequency:  Clean with Vashe [id number], Apply Aquacel #[id number] into the wound bed, and with 2cm more margin to protect the periwound skin. Change every 3 days of if is saturated, change daily. Cover with abd pad, wrap with Kerlix no too tight, since the bottom of the toes, until below the knee. Change daily. Obs: compressive therapy can be apply after sepsis treatment.  Pt can be benefit follow by the wound center.  WOC team will not plan to follow further.  Please reconsult if further assistance is needed. Thank-you,  Denyse Amass BSN, RN, [employer], WOC  (Pager: [phone])

## 2023-01-18 NOTE — Progress Notes (Signed)
Pharmacy Antibiotic Note  Justin Molina. is a 62 y.o. male admitted on 01/17/2023 with  medical history significant for history of stroke with residual right hemiparesis and aphasia, type 2 diabetes mellitus on insulin, congestive heart failure, EKG and obstructive sleep apnea not on CPAP who presents to the hospital today with a large open wound on his right leg.  Marland Kitchen  Pharmacy has been consulted for vanc dosing.  Plan: Vanc 1750mg  IV q36h (AUC 483, Scr 2.17) Follow renal function, cultures and clinical course  Height: 5\' 11"  (180.3 cm) Weight: 134.7 kg (296 lb 15.4 oz) IBW/kg (Calculated) : 75.3  Temp (24hrs), Avg:99.6 F (37.6 C), Min:97.7 F (36.5 C), Max:102.2 F (39 C)  Recent Labs  Lab 01/17/23 1527 01/17/23 1529  WBC 10.6*  --   CREATININE 2.17*  --   LATICACIDVEN  --  1.2    Estimated Creatinine Clearance: 49.5 mL/min (A) (by C-G formula based on SCr of 2.17 mg/dL (H)).    Allergies  Allergen Reactions   Contrast Media [Iodinated Contrast Media] Nausea And Vomiting and Swelling    Antimicrobials this admission: 12/12 vanc >> 12/12 cefepime >>  Dose adjustments this admission:   Microbiology results: 12/12 BCx:    Thank you for allowing pharmacy to be a part of this patient's care.  Arley Phenix RPh 01/18/2023, 1:10 AM

## 2023-01-18 NOTE — Plan of Care (Signed)

## 2023-01-18 NOTE — Progress Notes (Signed)
PHARMACY NOTE:  ANTIMICROBIAL RENAL DOSAGE ADJUSTMENT  Current antimicrobial regimen includes a mismatch between antimicrobial dosage and estimated renal function.  As per policy approved by the Pharmacy & Therapeutics and Medical Executive Committees, the antimicrobial dosage will be adjusted accordingly.  Current antimicrobial dosage: vancomycin 1750 mg q36h  Indication: cellulitis  Renal Function:  Estimated Creatinine Clearance: 57.3 mL/min (A) (by C-G formula based on SCr of 1.88 mg/dL (H)).    Antimicrobial dosage has been changed to: vancomycin 1250 mg q24h   Thank you for allowing pharmacy to be a part of this patient's care.  Pricilla Riffle, PharmD, BCPS Clinical Pharmacist 01/18/2023 9:38 AM

## 2023-01-18 NOTE — Progress Notes (Signed)
PROGRESS NOTE    Justin Molina.  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  Per wife wound was the size of a nickel about a month ago and has rapidly grown.  Patient has been having a lot of swelling.  He has had multiple changes to his medication recently.  She developed fever and vomiting.  Evaluation in the ED consistent with sepsis.   Assessment & Plan:   Principal Problem:   Sepsis (HCC) Active Problems:   DM (diabetes mellitus), type 2 with renal complications (HCC)   History of CVA with residual deficit   CKD (chronic kidney disease) stage 3, GFR 30-59 ml/min (HCC)   OSA (obstructive sleep apnea)   Cellulitis   1-Sepsis secondary to Right lower extremity wound with cellulitis -Continue with vancomycin and cefepime -Wound care consulted -Started zinc, vitamin c, Multivitamin.  -Blood culture: no growth to date  2-Diabetes type 2 Hold Januvia and Ozempic.  SSI.  Monitor CBG to determine Lantus dose.   3-History of A-fib: Continue with Eliquis Resume carvedilol.   4-History of CVA with residual deficits: Patient has aphasia at baseline right hemiparesis.  Walk with a cane  CKD stage IIIb: Previous Cr range 1.9--1.6 Monitor on lasix.   CAD; continue with imdur.  Resume lasix.  Check ECHO  Estimated body mass index is 41.79 kg/m as calculated from the following:   Height as of this encounter: 5\' 11"  (1.803 m).   Weight as of this encounter: 135.9 kg.   DVT prophylaxis: Eliquis Code Status: Full code Family Communication:Wife at bedside Disposition Plan:  Status is: Inpatient Remains inpatient appropriate because: management of wound infection    Consultants:   None  Procedures:  ECHO  Antimicrobials:    Subjective: He denies dyspnea, had some wheezing.  LE edema. Open wound started 3 weeks ago, getting bigger.    Objective: Vitals:   01/17/23 2116 01/17/23 2120 01/18/23 0153 01/18/23 0519  BP: (!) 140/83  (!) 159/84 (!) 147/86  Pulse: 72  75 82  Resp:    (!) 23  Temp: 97.7 F (36.5 C)  (!) 97.4 F (36.3 C) 99.1 F (37.3 C)  TempSrc: Oral  Oral Oral  SpO2: 97%  97% 96%  Weight:  134.7 kg  135.9 kg  Height:  5\' 11"  (1.803 m)      Intake/Output Summary (Last 24 hours) at 01/18/2023 0734 Last data filed at 01/18/2023 0600 Gross per 24 hour  Intake 3587.93 ml  Output 800 ml  Net 2787.93 ml   Filed Weights   01/17/23 1515 01/17/23 2120 01/18/23 0519  Weight: 132 kg 134.7 kg 135.9 kg    Examination:  General exam: Appears calm and comfortable  Respiratory system: Clear to auscultation. Respiratory effort normal. Cardiovascular system: S1 & S2 heard, RRR. No JVD, murmurs, rubs, gallops or clicks. No pedal edema. Gastrointestinal system: Abdomen is nondistended, soft and nontender. No organomegaly or masses felt. Normal bowel sounds heard. Central nervous system: Alert and oriented. No focal neurological deficits. Extremities: BL edema, right LE with open wound, drainage, hyperpigmentation   Data Reviewed: I have personally reviewed following labs and imaging studies  CBC: Recent Labs  Lab 01/17/23 1527 01/18/23 0513  WBC 10.6* 9.3  NEUTROABS 8.4*  --  HGB 12.1* 10.9*  HCT 36.5* 34.9*  MCV 81.8 85.3  PLT 292 292   Basic Metabolic Panel: Recent Labs  Lab 01/17/23 1527 01/18/23 0513  NA 139 139  K 3.3* 3.2*  CL 104 107  CO2 25 24  GLUCOSE 101* 86  BUN 22 18  CREATININE 2.17* 1.88*  CALCIUM 7.9* 7.5*   GFR: Estimated Creatinine Clearance: 57.3 mL/min (A) (by C-G formula based on SCr of 1.88 mg/dL (H)). Liver Function Tests: Recent Labs  Lab 01/17/23 1527  AST 24  ALT 14  ALKPHOS 60  BILITOT 0.8   PROT 8.6*  ALBUMIN 3.1*   No results for input(s): "LIPASE", "AMYLASE" in the last 168 hours. No results for input(s): "AMMONIA" in the last 168 hours. Coagulation Profile: No results for input(s): "INR", "PROTIME" in the last 168 hours. Cardiac Enzymes: No results for input(s): "CKTOTAL", "CKMB", "CKMBINDEX", "TROPONINI" in the last 168 hours. BNP (last 3 results) No results for input(s): "PROBNP" in the last 8760 hours. HbA1C: No results for input(s): "HGBA1C" in the last 72 hours. CBG: Recent Labs  Lab 01/17/23 2132  GLUCAP 101*   Lipid Profile: No results for input(s): "CHOL", "HDL", "LDLCALC", "TRIG", "CHOLHDL", "LDLDIRECT" in the last 72 hours. Thyroid Function Tests: Recent Labs    01/18/23 0513  TSH 0.893   Anemia Panel: No results for input(s): "VITAMINB12", "FOLATE", "FERRITIN", "TIBC", "IRON", "RETICCTPCT" in the last 72 hours. Sepsis Labs: Recent Labs  Lab 01/17/23 1529  LATICACIDVEN 1.2    Recent Results (from the past 240 hours)  Resp panel by RT-PCR (RSV, Flu A&B, Covid) Anterior Nasal Swab     Status: None   Collection Time: 01/08/23 10:32 AM   Specimen: Anterior Nasal Swab  Result Value Ref Range Status   SARS Coronavirus 2 by RT PCR NEGATIVE NEGATIVE Final    Comment: (NOTE) SARS-CoV-2 target nucleic acids are NOT DETECTED.  The SARS-CoV-2 RNA is generally detectable in upper respiratory specimens during the acute phase of infection. The lowest concentration of SARS-CoV-2 viral copies this assay can detect is 138 copies/mL. A negative result does not preclude SARS-Cov-2 infection and should not be used as the sole basis for treatment or other patient management decisions. A negative result may occur with  improper specimen collection/handling, submission of specimen other than nasopharyngeal swab, presence of viral mutation(s) within the areas targeted by this assay, and inadequate number of viral copies(<138 copies/mL). A negative result must  be combined with clinical observations, patient history, and epidemiological information. The expected result is Negative.  Fact Sheet for Patients:  BloggerCourse.com  Fact Sheet for Healthcare Providers:  SeriousBroker.it  This test is no t yet approved or cleared by the [place] FDA and  has been authorized for detection and/or diagnosis of SARS-CoV-2 by FDA under an Emergency Use Authorization (EUA). This EUA will remain  in effect (meaning this test can be used) for the duration of the COVID-19 declaration under Section 564(b)(1) of the Act, 21 U.S.C.section 360bbb-3(b)(1), unless the authorization is terminated  or revoked sooner.       Influenza A by PCR NEGATIVE NEGATIVE Final   Influenza B by PCR NEGATIVE NEGATIVE Final    Comment: (NOTE) The Xpert Xpress SARS-CoV-2/FLU/RSV plus assay is intended as an aid in the diagnosis of influenza from Nasopharyngeal swab specimens and should not be used as a sole basis for treatment. Nasal washings and aspirates are unacceptable for Xpert Xpress SARS-CoV-2/FLU/RSV testing.  Fact Sheet for Patients: BloggerCourse.com  Fact Sheet  for Healthcare Providers: SeriousBroker.it  This test is not yet approved or cleared by the [place] and has been authorized for detection and/or diagnosis of SARS-CoV-2 by FDA under an Emergency Use Authorization (EUA). This EUA will remain in effect (meaning this test can be used) for the duration of the COVID-19 declaration under Section 564(b)(1) of the Act, 21 U.S.C. section 360bbb-3(b)(1), unless the authorization is terminated or revoked.     Resp Syncytial Virus by PCR NEGATIVE NEGATIVE Final    Comment: (NOTE) Fact Sheet for Patients: BloggerCourse.com  Fact Sheet for Healthcare Providers: SeriousBroker.it  This test is not  yet approved or cleared by the [place] FDA and has been authorized for detection and/or diagnosis of SARS-CoV-2 by FDA under an Emergency Use Authorization (EUA). This EUA will remain in effect (meaning this test can be used) for the duration of the COVID-19 declaration under Section 564(b)(1) of the Act, 21 U.S.C. section 360bbb-3(b)(1), unless the authorization is terminated or revoked.  Performed at [facility], [address] [place], [place] [address]   Resp panel by RT-PCR (RSV, Flu A&B, Covid) Anterior Nasal Swab     Status: None   Collection Time: 01/17/23  3:28 PM   Specimen: Anterior Nasal Swab  Result Value Ref Range Status   SARS Coronavirus 2 by RT PCR NEGATIVE NEGATIVE Final    Comment: (NOTE) SARS-CoV-2 target nucleic acids are NOT DETECTED.  The SARS-CoV-2 RNA is generally detectable in upper respiratory specimens during the acute phase of infection. The lowest concentration of SARS-CoV-2 viral copies this assay can detect is 138 copies/mL. A negative result does not preclude SARS-Cov-2 infection and should not be used as the sole basis for treatment or other patient management decisions. A negative result may occur with  improper specimen collection/handling, submission of specimen other than nasopharyngeal swab, presence of viral mutation(s) within the areas targeted by this assay, and inadequate number of viral copies(<138 copies/mL). A negative result must be combined with clinical observations, patient history, and epidemiological information. The expected result is Negative.  Fact Sheet for Patients:  BloggerCourse.com  Fact Sheet for Healthcare Providers:  SeriousBroker.it  This test is no t yet approved or cleared by the [place] FDA and  has been authorized for detection and/or diagnosis of SARS-CoV-2 by FDA under an Emergency Use Authorization (EUA). This EUA will remain  in  effect (meaning this test can be used) for the duration of the COVID-19 declaration under Section 564(b)(1) of the Act, 21 U.S.C.section 360bbb-3(b)(1), unless the authorization is terminated  or revoked sooner.       Influenza A by PCR NEGATIVE NEGATIVE Final   Influenza B by PCR NEGATIVE NEGATIVE Final    Comment: (NOTE) The Xpert Xpress SARS-CoV-2/FLU/RSV plus assay is intended as an aid in the diagnosis of influenza from Nasopharyngeal swab specimens and should not be used as a sole basis for treatment. Nasal washings and aspirates are unacceptable for Xpert Xpress SARS-CoV-2/FLU/RSV testing.  Fact Sheet for Patients: BloggerCourse.com  Fact Sheet for Healthcare Providers: SeriousBroker.it  This test is not yet approved or cleared by the [place] FDA and has been authorized for detection and/or diagnosis of SARS-CoV-2 by FDA under an Emergency Use Authorization (EUA). This EUA will remain in effect (meaning this test can be used) for the duration of the COVID-19 declaration under Section 564(b)(1) of the Act, 21 U.S.C. section 360bbb-3(b)(1), unless the authorization is terminated or revoked.     Resp Syncytial  Virus by PCR NEGATIVE NEGATIVE Final    Comment: (NOTE) Fact Sheet for Patients: BloggerCourse.com  Fact Sheet for Healthcare Providers: SeriousBroker.it  This test is not yet approved or cleared by the [place] FDA and has been authorized for detection and/or diagnosis of SARS-CoV-2 by FDA under an Emergency Use Authorization (EUA). This EUA will remain in effect (meaning this test can be used) for the duration of the COVID-19 declaration under Section 564(b)(1) of the Act, 21 U.S.C. section 360bbb-3(b)(1), unless the authorization is terminated or revoked.  Performed at [facility], [address] [place], [place] [address]           Radiology Studies: DG Tibia/Fibula Right Result Date: 01/17/2023 CLINICAL DATA:  Edema, soft tissue wounds, concern for osteomyelitis EXAM: RIGHT TIBIA AND FIBULA - 2 VIEW COMPARISON:  07/22/2016 FINDINGS: Frontal and lateral views of the right tibia and fibula are obtained. There is extensive soft tissue edema throughout the visualized right lower leg. No evidence of subcutaneous gas or radiopaque foreign body. There are no acute or destructive bony abnormalities. No radiographic evidence of osteomyelitis. 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 IMPRESSION: 1. Diffuse soft tissue edema. No subcutaneous gas, radiopaque foreign body, or radiographic evidence of osteomyelitis. Electronically Signed   By: Sharlet Salina M.D.   On: 01/17/2023 16:45   DG Chest Portable 1 View Result Date: 01/17/2023 CLINICAL DATA:  Increasing weakness and fatigue for several weeks, sepsis EXAM: PORTABLE CHEST 1 VIEW COMPARISON:  01/08/2023 FINDINGS: Single frontal view of the chest demonstrates stable enlargement of the cardiac silhouette. There is increased pulmonary vascular congestion without focal consolidation, effusion, or pneumothorax. No acute bony abnormalities. IMPRESSION: 1. Increased pulmonary vascular congestion without overt edema. 2. Stable enlarged cardiac silhouette. Electronically Signed   By: Sharlet Salina M.D.   On: 01/17/2023 16:43        Scheduled Meds:  ascorbic acid  500 mg Oral Daily   zinc sulfate (50mg  elemental zinc)  220 mg Oral Daily   Continuous Infusions:  ceFEPime (MAXIPIME) IV 2 g (01/18/23 0531)   [START ON 01/19/2023] vancomycin       LOS: 1 day    Time spent: 35 minutes    Raha Tennison A Delilah Mulgrew, MD [facility]   If 7PM-7AM, please contact night-coverage [email or web address]  01/18/2023, 7:34 AM

## 2023-01-18 NOTE — Plan of Care (Signed)
?  Problem: Clinical Measurements: ?Goal: Ability to maintain clinical measurements within normal limits will improve ?Outcome: Progressing ?Goal: Will remain free from infection ?Outcome: Progressing ?Goal: Diagnostic test results will improve ?Outcome: Progressing ?  ?

## 2023-01-19 ENCOUNTER — Inpatient Hospital Stay (HOSPITAL_COMMUNITY): Payer: Medicare PPO

## 2023-01-19 DIAGNOSIS — R6520 Severe sepsis without septic shock: Secondary | ICD-10-CM | POA: Diagnosis not present

## 2023-01-19 DIAGNOSIS — A419 Sepsis, unspecified organism: Secondary | ICD-10-CM | POA: Diagnosis not present

## 2023-01-19 DIAGNOSIS — R9431 Abnormal electrocardiogram [ECG] [EKG]: Secondary | ICD-10-CM

## 2023-01-19 LAB — CBC
HCT: 34.7 % — ABNORMAL LOW (ref 39.0–52.0)
Hemoglobin: 11.5 g/dL — ABNORMAL LOW (ref 13.0–17.0)
MCH: 27.7 pg (ref 26.0–34.0)
MCHC: 33.1 g/dL (ref 30.0–36.0)
MCV: 83.6 fL (ref 80.0–100.0)
Platelets: 286 10*3/uL (ref 150–400)
RBC: 4.15 MIL/uL — ABNORMAL LOW (ref 4.22–5.81)
RDW: 14 % (ref 11.5–15.5)
WBC: 9 10*3/uL (ref 4.0–10.5)
nRBC: 0 % (ref 0.0–0.2)

## 2023-01-19 LAB — BASIC METABOLIC PANEL
Anion gap: 7 (ref 5–15)
BUN: 16 mg/dL (ref 8–23)
CO2: 25 mmol/L (ref 22–32)
Calcium: 7.3 mg/dL — ABNORMAL LOW (ref 8.9–10.3)
Chloride: 105 mmol/L (ref 98–111)
Creatinine, Ser: 1.66 mg/dL — ABNORMAL HIGH (ref 0.61–1.24)
GFR, Estimated: 46 mL/min — ABNORMAL LOW (ref >60)
Glucose, Bld: 93 mg/dL (ref 70–99)
Potassium: 3.1 mmol/L — ABNORMAL LOW (ref 3.5–5.1)
Sodium: 137 mmol/L (ref 135–145)

## 2023-01-19 LAB — ECHOCARDIOGRAM COMPLETE
Height: 71 in
S' Lateral: 4.2 cm
Weight: 4793.68 [oz_av]

## 2023-01-19 LAB — GLUCOSE, CAPILLARY
Glucose-Capillary: 130 mg/dL — ABNORMAL HIGH (ref 70–99)
Glucose-Capillary: 140 mg/dL — ABNORMAL HIGH (ref 70–99)
Glucose-Capillary: 122 mg/dL — ABNORMAL HIGH (ref 70–99)
Glucose-Capillary: 125 mg/dL — ABNORMAL HIGH (ref 70–99)

## 2023-01-19 MED ORDER — AMLODIPINE BESYLATE 5 MG PO TABS
5.0000 mg | ORAL_TABLET | Freq: Every day | ORAL | Status: DC
  Administered 2023-01-20 – 2023-01-21 (×2): 5 mg via ORAL
  Filled 2023-01-19 (×2): qty 1

## 2023-01-19 MED ORDER — DOXAZOSIN MESYLATE 2 MG PO TABS
8.0000 mg | ORAL_TABLET | Freq: Every day | ORAL | Status: DC
  Administered 2023-01-19 – 2023-01-20 (×2): 8 mg via ORAL
  Filled 2023-01-19 (×2): qty 4

## 2023-01-19 MED ORDER — SODIUM CHLORIDE 0.9 % IV SOLN
2.0000 g | Freq: Three times a day (TID) | INTRAVENOUS | Status: DC
  Administered 2023-01-19 – 2023-01-23 (×12): 2 g via INTRAVENOUS
  Filled 2023-01-19 (×12): qty 12.5

## 2023-01-19 MED ORDER — LINEZOLID 600 MG/300ML IV SOLN
600.0000 mg | Freq: Two times a day (BID) | INTRAVENOUS | Status: DC
  Administered 2023-01-19 – 2023-01-23 (×9): 600 mg via INTRAVENOUS
  Filled 2023-01-19 (×9): qty 300

## 2023-01-19 MED ORDER — ARTIFICIAL TEARS OPHTHALMIC OINT
TOPICAL_OINTMENT | OPHTHALMIC | Status: DC | PRN
  Filled 2023-01-19: qty 3.5

## 2023-01-19 MED ORDER — POTASSIUM CHLORIDE CRYS ER 20 MEQ PO TBCR
40.0000 meq | EXTENDED_RELEASE_TABLET | Freq: Once | ORAL | Status: AC
  Administered 2023-01-19: 40 meq via ORAL
  Filled 2023-01-19: qty 2

## 2023-01-19 MED ORDER — METOPROLOL TARTRATE 5 MG/5ML IV SOLN
2.5000 mg | Freq: Once | INTRAVENOUS | Status: AC
  Administered 2023-01-19: 2.5 mg via INTRAVENOUS
  Filled 2023-01-19: qty 5

## 2023-01-19 MED ORDER — DRONEDARONE HCL 400 MG PO TABS
400.0000 mg | ORAL_TABLET | Freq: Two times a day (BID) | ORAL | Status: DC
  Administered 2023-01-19 – 2023-01-25 (×12): 400 mg via ORAL
  Filled 2023-01-19 (×13): qty 1

## 2023-01-19 MED ORDER — FUROSEMIDE 40 MG PO TABS
40.0000 mg | ORAL_TABLET | Freq: Every day | ORAL | Status: DC
  Administered 2023-01-19 – 2023-01-25 (×7): 40 mg via ORAL
  Filled 2023-01-19 (×7): qty 1

## 2023-01-19 MED ORDER — POLYETHYLENE GLYCOL 3350 17 G PO PACK
17.0000 g | PACK | Freq: Every day | ORAL | Status: DC
  Administered 2023-01-19 – 2023-01-25 (×7): 17 g via ORAL
  Filled 2023-01-19 (×7): qty 1

## 2023-01-19 NOTE — Progress Notes (Signed)
  Echocardiogram 2D Echocardiogram has been performed.  Justin Molina 01/19/2023, 2:53 PM

## 2023-01-19 NOTE — Progress Notes (Addendum)
PROGRESS NOTE    Justin Molina.  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  Per wife wound was the size of a nickel about a month ago and has rapidly grown.  Patient has been having a lot of swelling.  He has had multiple changes to his medication recently.  She developed fever and vomiting.  Evaluation in the ED consistent with sepsis.   Assessment & Plan:   Principal Problem:   Sepsis (HCC) Active Problems:   DM (diabetes mellitus), type 2 with renal complications (HCC)   History of CVA with residual deficit   CKD (chronic kidney disease) stage 3, GFR 30-59 ml/min (HCC)   OSA (obstructive sleep apnea)   Cellulitis   1-Sepsis secondary to Right lower extremity wound with cellulitis -Continue with Linezolid and cefepime -Wound care consulted -Started zinc, vitamin c, Multivitamin.  -Blood culture: no growth to date  2-Diabetes type 2 Hold Januvia and Ozempic.  SSI.  Monitor CBG to determine Lantus dose.  CBG not significant elevated.   3-History of A-fib: Continue with Eliquis Resume carvedilol.   4-History of CVA with residual deficits: Patient has aphasia at baseline right hemiparesis.  Walk with a cane  CKD stage IIIb: Previous Cr range 1.9--1.6 Monitor on lasix.  Stable.  Resume lasix.   Hypokalemia; replaced.   CAD; continue with imdur.  Resume lasix.  Check ECHO  Acute metabolic Encephalopathy; Wife is concern he has been more confuse, and had difficulty saying his name yesterday.  Patient denies headaches, vision changes.  Check CT head.  Could be in setting of infection.   Dry eye; will order eye drop, artifical tear.   Estimated body mass index is 41.79  kg/m as calculated from the following:   Height as of this encounter: 5\' 11"  (1.803 m).   Weight as of this encounter: 135.9 kg.   DVT prophylaxis: Eliquis Code Status: Full code Family Communication:Wife at bedside Disposition Plan:  Status is: Inpatient Remains inpatient appropriate because: management of wound infection    Consultants:  None  Procedures:  ECHO  Antimicrobials:    Subjective: He is feeling well  LE with less edema, less drainage from wound./   Objective: Vitals:   01/19/23 0500 01/19/23 0530 01/19/23 0600 01/19/23 0630  BP:  (!) 150/78    Pulse:  (!) 110    Resp: 20 20 20 20   Temp:  98.6 F (37 C)    TempSrc:  Oral    SpO2:  96%    Weight:      Height:        Intake/Output Summary (Last 24 hours) at 01/19/2023 0806 Last data filed at 01/19/2023 0736 Gross per 24 hour  Intake 1140 ml  Output 3475 ml  Net -2335 ml   Filed Weights   01/17/23 1515 01/17/23 2120 01/18/23 0519  Weight: 132 kg 134.7 kg 135.9 kg    Examination:  General exam: NAD Respiratory system: CTA Cardiovascular system: S 1,S 2 RRR Gastrointestinal system: BS present, soft, nt Central nervous system: alert Extremities: less edema, hyperpigmentation. Open wound less drainage.   Data Reviewed: I have personally reviewed following labs and imaging studies  CBC: Recent Labs  Lab 01/17/23 1527 01/18/23 0513 01/19/23 0509  WBC 10.6* 9.3 9.0  NEUTROABS 8.4*  --   --   HGB 12.1* 10.9* 11.5*  HCT 36.5* 34.9* 34.7*  MCV 81.8 85.3 83.6  PLT 292 292 286   Basic Metabolic Panel: Recent Labs  Lab 01/17/23 1527 01/18/23 0513 01/19/23 0509  NA 139 139 137  K 3.3* 3.2* 3.1*  CL 104 107 105  CO2 25 24 25   GLUCOSE 101* 86 93  BUN 22 18 16   CREATININE 2.17* 1.88* 1.66*  CALCIUM 7.9* 7.5* 7.3*   GFR: Estimated Creatinine Clearance: 64.9 mL/min (A) (by C-G formula based on SCr of 1.66 mg/dL (H)). Liver Function Tests: Recent Labs  Lab 01/17/23 1527  AST 24   ALT 14  ALKPHOS 60  BILITOT 0.8  PROT 8.6*  ALBUMIN 3.1*   No results for input(s): "LIPASE", "AMYLASE" in the last 168 hours. No results for input(s): "AMMONIA" in the last 168 hours. Coagulation Profile: No results for input(s): "INR", "PROTIME" in the last 168 hours. Cardiac Enzymes: No results for input(s): "CKTOTAL", "CKMB", "CKMBINDEX", "TROPONINI" in the last 168 hours. BNP (last 3 results) No results for input(s): "PROBNP" in the last 8760 hours. HbA1C: Recent Labs    01/18/23 0513  HGBA1C 7.5*   CBG: Recent Labs  Lab 01/18/23 0804 01/18/23 1131 01/18/23 1618 01/18/23 2130 01/19/23 0732  GLUCAP 137* 99 120* 103* 130*   Lipid Profile: No results for input(s): "CHOL", "HDL", "LDLCALC", "TRIG", "CHOLHDL", "LDLDIRECT" in the last 72 hours. Thyroid Function Tests: Recent Labs    01/18/23 0513  TSH 0.893   Anemia Panel: No results for input(s): "VITAMINB12", "FOLATE", "FERRITIN", "TIBC", "IRON", "RETICCTPCT" in the last 72 hours. Sepsis Labs: Recent Labs  Lab 01/17/23 1529  LATICACIDVEN 1.2    Recent Results (from the past 240 hours)  Blood culture (routine x 2)     Status: None (Preliminary result)   Collection Time: 01/17/23  3:27 PM   Specimen: BLOOD  Result Value Ref Range Status   Specimen Description   Final    BLOOD LEFT ANTECUBITAL Performed at [facility], [address] [place], [place] [address]    Special Requests   Final    BOTTLES DRAWN AEROBIC AND ANAEROBIC Blood Culture results may not be optimal due to an inadequate volume of blood received in culture bottles Performed at [facility], [address] [place], [place] [address]    Culture   Final    NO GROWTH < 24 HOURS Performed at [facility], [address], [place], [place] [address]    Report Status PENDING  Incomplete  Resp panel by RT-PCR (RSV, Flu A&B, Covid) Anterior Nasal Swab     Status: None   Collection Time: 01/17/23  3:28 PM    Specimen: Anterior Nasal Swab  Result Value Ref Range Status   SARS Coronavirus 2 by RT PCR NEGATIVE NEGATIVE Final    Comment: (NOTE) SARS-CoV-2 target nucleic acids are NOT DETECTED.  The SARS-CoV-2 RNA is generally detectable in upper respiratory specimens during the acute phase of infection. The lowest concentration of SARS-CoV-2 viral copies this assay can detect is 138 copies/mL. A negative result does not preclude SARS-Cov-2 infection and should not be used as the sole basis for treatment or other patient management decisions. A negative result may occur with  improper specimen collection/handling, submission of specimen other than nasopharyngeal swab, presence of viral mutation(s) within the areas targeted by this assay, and inadequate number of viral copies(<138 copies/mL). A negative result  must be combined with clinical observations, patient history, and epidemiological information. The expected result is Negative.  Fact Sheet for Patients:  BloggerCourse.com  Fact Sheet for Healthcare Providers:  SeriousBroker.it  This test is no t yet approved or cleared by the [place] FDA and  has been authorized for detection and/or diagnosis of SARS-CoV-2 by FDA under an Emergency Use Authorization (EUA). This EUA will remain  in effect (meaning this test can be used) for the duration of the COVID-19 declaration under Section 564(b)(1) of the Act, 21 U.S.C.section 360bbb-3(b)(1), unless the authorization is terminated  or revoked sooner.       Influenza A by PCR NEGATIVE NEGATIVE Final   Influenza B by PCR NEGATIVE NEGATIVE Final    Comment: (NOTE) The Xpert Xpress SARS-CoV-2/FLU/RSV plus assay is intended as an aid in the diagnosis of influenza from Nasopharyngeal swab specimens and should not be used as a sole basis for treatment. Nasal washings and aspirates are unacceptable for Xpert Xpress  SARS-CoV-2/FLU/RSV testing.  Fact Sheet for Patients: BloggerCourse.com  Fact Sheet for Healthcare Providers: SeriousBroker.it  This test is not yet approved or cleared by the [place] FDA and has been authorized for detection and/or diagnosis of SARS-CoV-2 by FDA under an Emergency Use Authorization (EUA). This EUA will remain in effect (meaning this test can be used) for the duration of the COVID-19 declaration under Section 564(b)(1) of the Act, 21 U.S.C. section 360bbb-3(b)(1), unless the authorization is terminated or revoked.     Resp Syncytial Virus by PCR NEGATIVE NEGATIVE Final    Comment: (NOTE) Fact Sheet for Patients: BloggerCourse.com  Fact Sheet for Healthcare Providers: SeriousBroker.it  This test is not yet approved or cleared by the [place] FDA and has been authorized for detection and/or diagnosis of SARS-CoV-2 by FDA under an Emergency Use Authorization (EUA). This EUA will remain in effect (meaning this test can be used) for the duration of the COVID-19 declaration under Section 564(b)(1) of the Act, 21 U.S.C. section 360bbb-3(b)(1), unless the authorization is terminated or revoked.  Performed at [facility], [address], [place], [place] [address]          Radiology Studies: DG Tibia/Fibula Right Result Date: 01/17/2023 CLINICAL DATA:  Edema, soft tissue wounds, concern for osteomyelitis EXAM: RIGHT TIBIA AND FIBULA - 2 VIEW COMPARISON:  07/22/2016 FINDINGS: Frontal and lateral views of the right tibia and fibula are obtained. There is extensive soft tissue edema throughout the visualized right lower leg. No evidence of subcutaneous gas or radiopaque foreign body. There are no acute or destructive bony abnormalities. No radiographic evidence of osteomyelitis. 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 IMPRESSION: 1. Diffuse soft tissue edema. No subcutaneous gas, radiopaque foreign body, or radiographic evidence of osteomyelitis. Electronically Signed   By: Sharlet Salina M.D.   On: 01/17/2023 16:45   DG Chest Portable 1 View Result Date: 01/17/2023 CLINICAL DATA:  Increasing weakness and fatigue for several weeks, sepsis EXAM: PORTABLE CHEST 1 VIEW COMPARISON:  01/08/2023 FINDINGS: Single frontal view of the chest demonstrates stable enlargement of the cardiac silhouette. There is increased pulmonary vascular congestion without focal consolidation, effusion, or pneumothorax. No acute bony abnormalities. IMPRESSION: 1. Increased pulmonary vascular congestion without overt edema. 2. Stable enlarged cardiac silhouette. Electronically Signed   By: Sharlet Salina M.D.   On: 01/17/2023 16:43        Scheduled Meds:  apixaban  5 mg  Oral BID   ascorbic acid  500 mg Oral Daily   atorvastatin  80 mg Oral Daily   carvedilol  25 mg Oral BID WC   feeding supplement (GLUCERNA SHAKE)  237 mL Oral BID BM   furosemide  40 mg Oral Daily   insulin aspart  0-15 Units Subcutaneous TID WC   insulin aspart  0-5 Units Subcutaneous QHS   isosorbide mononitrate  120 mg Oral Daily   multivitamin with minerals  1 tablet Oral Daily   potassium chloride  40 mEq Oral Once   zinc sulfate (50mg  elemental zinc)  220 mg Oral Daily   Continuous Infusions:  ceFEPime (MAXIPIME) IV Stopped (01/19/23 0452)   linezolid (ZYVOX) IV       LOS: 2 days    Time spent: 35 minutes    Suhaib Guzzo A Elgene Coral, MD [facility]   If 7PM-7AM, please contact night-coverage [email or web address]  01/19/2023, 8:06 AM

## 2023-01-19 NOTE — Plan of Care (Signed)

## 2023-01-19 NOTE — Progress Notes (Signed)
Pharmacy Antibiotic Note  Justin Molina. is a 62 y.o. 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 Pharmacy consulted for vancomycin and cefepime dosing.  Plan: -Increase cefepime to 2 g IV q8h -Vancomycin changed to linezolid -Continue to follow renal function, cultures and clinical course for dose adjustments and de-escalation as indicated  Height: 5\' 11"  (180.3 cm) Weight: 135.9 kg (299 lb 9.7 oz) IBW/kg (Calculated) : 75.3  Temp (24hrs), Avg:98.8 F (37.1 C), Min:98.6 F (37 C), Max:99.3 F (37.4 C)  Recent Labs  Lab 01/17/23 1527 01/17/23 1529 01/18/23 0513 01/19/23 0509  WBC 10.6*  --  9.3 9.0  CREATININE 2.17*  --  1.88* 1.66*  LATICACIDVEN  --  1.2  --   --     Estimated Creatinine Clearance: 64.9 mL/min (A) (by C-G formula based on SCr of 1.66 mg/dL (H)).    Allergies  Allergen Reactions   Contrast Media [Iodinated Contrast Media] Nausea And Vomiting and Swelling    Antimicrobials this admission: Linezolid 12/14 >> Cefepime 12/12 >> Vancomycin 12/12 >> 12/14  Microbiology results: 12/12 BCx: ngtd   Thank you for allowing pharmacy to be a part of this patient's care.  Pricilla Riffle, PharmD, BCPS Clinical Pharmacist 01/19/2023 8:36 AM

## 2023-01-20 ENCOUNTER — Inpatient Hospital Stay (HOSPITAL_COMMUNITY): Payer: Medicare PPO

## 2023-01-20 DIAGNOSIS — R6520 Severe sepsis without septic shock: Secondary | ICD-10-CM | POA: Diagnosis not present

## 2023-01-20 DIAGNOSIS — A419 Sepsis, unspecified organism: Secondary | ICD-10-CM | POA: Diagnosis not present

## 2023-01-20 LAB — BASIC METABOLIC PANEL
Anion gap: 6 (ref 5–15)
BUN: 18 mg/dL (ref 8–23)
CO2: 25 mmol/L (ref 22–32)
Calcium: 7.7 mg/dL — ABNORMAL LOW (ref 8.9–10.3)
Chloride: 107 mmol/L (ref 98–111)
Creatinine, Ser: 1.53 mg/dL — ABNORMAL HIGH (ref 0.61–1.24)
GFR, Estimated: 51 mL/min — ABNORMAL LOW (ref >60)
Glucose, Bld: 151 mg/dL — ABNORMAL HIGH (ref 70–99)
Potassium: 3.6 mmol/L (ref 3.5–5.1)
Sodium: 138 mmol/L (ref 135–145)

## 2023-01-20 LAB — CBC
HCT: 34.7 % — ABNORMAL LOW (ref 39.0–52.0)
Hemoglobin: 11.6 g/dL — ABNORMAL LOW (ref 13.0–17.0)
MCH: 27.9 pg (ref 26.0–34.0)
MCHC: 33.4 g/dL (ref 30.0–36.0)
MCV: 83.4 fL (ref 80.0–100.0)
Platelets: 292 10*3/uL (ref 150–400)
RBC: 4.16 MIL/uL — ABNORMAL LOW (ref 4.22–5.81)
RDW: 14.1 % (ref 11.5–15.5)
WBC: 8.1 10*3/uL (ref 4.0–10.5)
nRBC: 0 % (ref 0.0–0.2)

## 2023-01-20 LAB — GLUCOSE, CAPILLARY
Glucose-Capillary: 91 mg/dL (ref 70–99)
Glucose-Capillary: 131 mg/dL — ABNORMAL HIGH (ref 70–99)
Glucose-Capillary: 111 mg/dL — ABNORMAL HIGH (ref 70–99)
Glucose-Capillary: 154 mg/dL — ABNORMAL HIGH (ref 70–99)

## 2023-01-20 LAB — VITAMIN B12: Vitamin B-12: 620 pg/mL (ref 180–914)

## 2023-01-20 MED ORDER — SENNOSIDES-DOCUSATE SODIUM 8.6-50 MG PO TABS
1.0000 | ORAL_TABLET | Freq: Two times a day (BID) | ORAL | Status: DC
  Administered 2023-01-20 – 2023-01-25 (×10): 1 via ORAL
  Filled 2023-01-20 (×11): qty 1

## 2023-01-20 MED ORDER — HYDRALAZINE HCL 50 MG PO TABS
100.0000 mg | ORAL_TABLET | Freq: Two times a day (BID) | ORAL | Status: DC
Start: 2023-01-20 — End: 2023-01-23
  Administered 2023-01-20 – 2023-01-22 (×5): 100 mg via ORAL
  Filled 2023-01-20 (×5): qty 2

## 2023-01-20 MED ORDER — ATORVASTATIN CALCIUM 40 MG PO TABS
80.0000 mg | ORAL_TABLET | Freq: Every day | ORAL | Status: DC
  Administered 2023-01-21 – 2023-01-24 (×4): 80 mg via ORAL
  Filled 2023-01-20 (×4): qty 2

## 2023-01-20 MED ORDER — DOXAZOSIN MESYLATE 2 MG PO TABS
8.0000 mg | ORAL_TABLET | Freq: Every day | ORAL | Status: DC
  Administered 2023-01-21 – 2023-01-24 (×4): 8 mg via ORAL
  Filled 2023-01-20 (×4): qty 4

## 2023-01-20 NOTE — Progress Notes (Signed)
PROGRESS NOTE    Justin Molina.  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  Per wife wound was the size of a nickel about a month ago and has rapidly grown.  Patient has been having a lot of swelling.  He has had multiple changes to his medication recently.  She developed fever and vomiting.  Evaluation in the ED consistent with sepsis.   Assessment & Plan:   Principal Problem:   Sepsis (HCC) Active Problems:   DM (diabetes mellitus), type 2 with renal complications (HCC)   History of CVA with residual deficit   CKD (chronic kidney disease) stage 3, GFR 30-59 ml/min (HCC)   OSA (obstructive sleep apnea)   Cellulitis   1-Sepsis secondary to Right lower extremity wound with cellulitis -Continue with Linezolid and cefepime -Wound care consulted -Started zinc, vitamin c, Multivitamin.  -Blood culture: no growth to date Improving, could transition to oral antibiotic tomorrow.   2-Diabetes type 2 Hold Januvia and Ozempic.  SSI.  Monitor CBG to determine Lantus dose.  CBG not significant elevated.   3-Nausea, vomiting.  Check KUB  History of A-fib: Continue with Eliquis Resume carvedilol.  Resume Moltaq 12/14  4-History of CVA with residual deficits: Patient has aphasia at baseline right hemiparesis.  Walk with a cane  CKD stage IIIb: Previous Cr range 1.9--1.6 Monitor on lasix.  Stable.  Resume lasix.   Hypokalemia; replaced.   CAD; continue with imdur.  Resume lasix.  ECHO: Normal EF  HTN; continue with norvasc, , lasix, carvedilol.  resume hydralazine  Acute metabolic Encephalopathy; -Wife is concern he has been more confuse, and had difficulty saying his name  yesterday.  Patient denies headaches, vision changes.  -CT head. No acute finding, Chronic left side craniotomy with fairly advanced underlying left hemisphere chronic encephalomalacia, Wallerian degeneration. Mild superimposed white matter changes in the right hemisphere are most commonly due to small vessel disease. Could be in setting of infection.  Stable.   Dry eye; will order eye drop, artifical tear.   Estimated body mass index is 41.79 kg/m as calculated from the following:   Height as of this encounter: 5\' 11"  (1.803 m).   Weight as of this encounter: 135.9 kg.   DVT prophylaxis: Eliquis Code Status: Full code Family Communication:Wife at bedside Disposition Plan:  Status is: Inpatient Remains inpatient appropriate because: management of wound infection    Consultants:  None  Procedures:  ECHO  Antimicrobials:    Subjective: Wife report he vomited. He denies abdominal pain. Had small hard BM today.  He has chronic aphasia.   Objective: Vitals:   01/19/23 1351 01/19/23 1700 01/19/23 2024 01/20/23 0522  BP: (!) 141/89 (!) 150/86 (!) 152/84 (!) 165/98  Pulse: (!) 102 76 76 73  Resp: 20     Temp: 99 F (37.2 C)  97.9 F (36.6 C) 98 F (36.7 C)  TempSrc: Oral  Oral Oral  SpO2: 100%  95% 93%  Weight:      Height:        Intake/Output Summary (Last 24 hours) at 01/20/2023 0757 Last data filed at 01/19/2023 1800 Gross per 24 hour  Intake 640 ml  Output --  Net 640 ml   Filed Weights   01/17/23 1515 01/17/23  2120 01/18/23 0519  Weight: 132 kg 134.7 kg 135.9 kg    Examination:  General exam:NAD Respiratory system: CTA Cardiovascular system: S 1, S 2 RRR Gastrointestinal system: BS present, soft, nt Central nervous system: Alert, follows command Extremities: less edema, hyperpigmentation. Open wound less drainage.   Data Reviewed: I have personally reviewed following labs and imaging studies  CBC: Recent Labs  Lab 01/17/23 1527  01/18/23 0513 01/19/23 0509  WBC 10.6* 9.3 9.0  NEUTROABS 8.4*  --   --   HGB 12.1* 10.9* 11.5*  HCT 36.5* 34.9* 34.7*  MCV 81.8 85.3 83.6  PLT 292 292 286   Basic Metabolic Panel: Recent Labs  Lab 01/17/23 1527 01/18/23 0513 01/19/23 0509  NA 139 139 137  K 3.3* 3.2* 3.1*  CL 104 107 105  CO2 25 24 25   GLUCOSE 101* 86 93  BUN 22 18 16   CREATININE 2.17* 1.88* 1.66*  CALCIUM 7.9* 7.5* 7.3*   GFR: Estimated Creatinine Clearance: 64.9 mL/min (A) (by C-G formula based on SCr of 1.66 mg/dL (H)). Liver Function Tests: Recent Labs  Lab 01/17/23 1527  AST 24  ALT 14  ALKPHOS 60  BILITOT 0.8  PROT 8.6*  ALBUMIN 3.1*   No results for input(s): "LIPASE", "AMYLASE" in the last 168 hours. No results for input(s): "AMMONIA" in the last 168 hours. Coagulation Profile: No results for input(s): "INR", "PROTIME" in the last 168 hours. Cardiac Enzymes: No results for input(s): "CKTOTAL", "CKMB", "CKMBINDEX", "TROPONINI" in the last 168 hours. BNP (last 3 results) No results for input(s): "PROBNP" in the last 8760 hours. HbA1C: Recent Labs    01/18/23 0513  HGBA1C 7.5*   CBG: Recent Labs  Lab 01/19/23 0732 01/19/23 1057 01/19/23 1644 01/19/23 2026 01/20/23 0727  GLUCAP 130* 140* 122* 125* 91   Lipid Profile: No results for input(s): "CHOL", "HDL", "LDLCALC", "TRIG", "CHOLHDL", "LDLDIRECT" in the last 72 hours. Thyroid Function Tests: Recent Labs    01/18/23 0513  TSH 0.893   Anemia Panel: No results for input(s): "VITAMINB12", "FOLATE", "FERRITIN", "TIBC", "IRON", "RETICCTPCT" in the last 72 hours. Sepsis Labs: Recent Labs  Lab 01/17/23 1529  LATICACIDVEN 1.2    Recent Results (from the past 240 hours)  Blood culture (routine x 2)     Status: None (Preliminary result)   Collection Time: 01/17/23  3:27 PM   Specimen: BLOOD  Result Value Ref Range Status   Specimen Description   Final    BLOOD LEFT ANTECUBITAL Performed at [facility], [address] [place], [place] [address]    Special Requests   Final    BOTTLES DRAWN AEROBIC AND ANAEROBIC Blood Culture results may not be optimal due to an inadequate volume of blood received in culture bottles Performed at [facility], [address] [place], [place] [address]    Culture   Final    NO GROWTH 2 DAYS Performed at [facility], [address], [place], [place] [address]    Report Status PENDING  Incomplete  Resp panel by RT-PCR (RSV, Flu A&B, Covid) Anterior Nasal Swab     Status: None   Collection Time: 01/17/23  3:28 PM   Specimen: Anterior Nasal Swab  Result Value Ref Range Status   SARS Coronavirus 2 by RT PCR NEGATIVE NEGATIVE Final    Comment: (NOTE) SARS-CoV-2 target nucleic acids are NOT DETECTED.  The SARS-CoV-2 RNA is generally detectable in upper respiratory specimens during the acute phase  of infection. The lowest concentration of SARS-CoV-2 viral copies this assay can detect is 138 copies/mL. A negative result does not preclude SARS-Cov-2 infection and should not be used as the sole basis for treatment or other patient management decisions. A negative result may occur with  improper specimen collection/handling, submission of specimen other than nasopharyngeal swab, presence of viral mutation(s) within the areas targeted by this assay, and inadequate number of viral copies(<138 copies/mL). A negative result must be combined with clinical observations, patient history, and epidemiological information. The expected result is Negative.  Fact Sheet for Patients:  BloggerCourse.com  Fact Sheet for Healthcare Providers:  SeriousBroker.it  This test is no t yet approved or cleared by the [place] FDA and  has been authorized for detection and/or diagnosis of SARS-CoV-2 by FDA under an Emergency Use Authorization (EUA). This EUA will remain  in effect (meaning this test can be used)  for the duration of the COVID-19 declaration under Section 564(b)(1) of the Act, 21 U.S.C.section 360bbb-3(b)(1), unless the authorization is terminated  or revoked sooner.       Influenza A by PCR NEGATIVE NEGATIVE Final   Influenza B by PCR NEGATIVE NEGATIVE Final    Comment: (NOTE) The Xpert Xpress SARS-CoV-2/FLU/RSV plus assay is intended as an aid in the diagnosis of influenza from Nasopharyngeal swab specimens and should not be used as a sole basis for treatment. Nasal washings and aspirates are unacceptable for Xpert Xpress SARS-CoV-2/FLU/RSV testing.  Fact Sheet for Patients: BloggerCourse.com  Fact Sheet for Healthcare Providers: SeriousBroker.it  This test is not yet approved or cleared by the [place] FDA and has been authorized for detection and/or diagnosis of SARS-CoV-2 by FDA under an Emergency Use Authorization (EUA). This EUA will remain in effect (meaning this test can be used) for the duration of the COVID-19 declaration under Section 564(b)(1) of the Act, 21 U.S.C. section 360bbb-3(b)(1), unless the authorization is terminated or revoked.     Resp Syncytial Virus by PCR NEGATIVE NEGATIVE Final    Comment: (NOTE) Fact Sheet for Patients: BloggerCourse.com  Fact Sheet for Healthcare Providers: SeriousBroker.it  This test is not yet approved or cleared by the [place] FDA and has been authorized for detection and/or diagnosis of SARS-CoV-2 by FDA under an Emergency Use Authorization (EUA). This EUA will remain in effect (meaning this test can be used) for the duration of the COVID-19 declaration under Section 564(b)(1) of the Act, 21 U.S.C. section 360bbb-3(b)(1), unless the authorization is terminated or revoked.  Performed at [facility], [address] [place], [place] [address]          Radiology Studies: ECHOCARDIOGRAM  COMPLETE Result Date: 01/19/2023    ECHOCARDIOGRAM REPORT   Patient Name:   Justin Molina. Date of Exam: 01/19/2023 Medical Rec #:  [id number]           Height:       71.0 in Accession #:    [id number]          Weight:       299.6 lb Date of Birth:  October 22, 1960          BSA:          2.504 m Patient Age:    62 years            BP:           141/89 mmHg Patient Gender: M  HR:           101 bpm. Exam Location:  Inpatient Procedure: 2D Echo, Color Doppler and Cardiac Doppler Indications:    abnormal ecg  History:        Patient has prior history of Echocardiogram examinations, most                 recent 07/02/2013. Chronic kidney disease; Risk Factors:Sleep                 Apnea, Hypertension, Dyslipidemia and Diabetes.  Sonographer:    Delcie Roch RDCS Referring Phys: [id number] [facility] A Raji Glinski  Sonographer Comments: Image acquisition challenging due to patient body habitus. IMPRESSIONS  1. Left ventricular ejection fraction, by estimation, is 60 to 65%. The left ventricle has normal function. The left ventricle has no regional wall motion abnormalities. There is mild left ventricular hypertrophy. Left ventricular diastolic parameters were normal.  2. Right ventricular systolic function is normal. The right ventricular size is normal.  3. Left atrial size was mildly dilated.  4. The mitral valve is normal in structure. No evidence of mitral valve regurgitation. No evidence of mitral stenosis.  5. The aortic valve is normal in structure. Aortic valve regurgitation is not visualized. No aortic stenosis is present.  6. The inferior vena cava is normal in size with greater than 50% respiratory variability, suggesting right atrial pressure of 3 mmHg. FINDINGS  Left Ventricle: Left ventricular ejection fraction, by estimation, is 60 to 65%. The left ventricle has normal function. The left ventricle has no regional wall motion abnormalities. The left ventricular internal cavity size was normal in  size. There is  mild left ventricular hypertrophy. Left ventricular diastolic parameters were normal. Right Ventricle: The right ventricular size is normal. No increase in right ventricular wall thickness. Right ventricular systolic function is normal. Left Atrium: Left atrial size was mildly dilated. Right Atrium: Right atrial size was normal in size. Pericardium: Trivial pericardial effusion is present. The pericardial effusion is posterior to the left ventricle. Mitral Valve: The mitral valve is normal in structure. No evidence of mitral valve regurgitation. No evidence of mitral valve stenosis. Tricuspid Valve: The tricuspid valve is normal in structure. Tricuspid valve regurgitation is not demonstrated. No evidence of tricuspid stenosis. Aortic Valve: The aortic valve is normal in structure. Aortic valve regurgitation is not visualized. No aortic stenosis is present. Pulmonic Valve: The pulmonic valve was normal in structure. Pulmonic valve regurgitation is not visualized. No evidence of pulmonic stenosis. Aorta: The aortic root is normal in size and structure. Venous: The inferior vena cava is normal in size with greater than 50% respiratory variability, suggesting right atrial pressure of 3 mmHg. IAS/Shunts: No atrial level shunt detected by color flow Doppler.  LEFT VENTRICLE PLAX 2D LVIDd:         5.40 cm LVIDs:         4.20 cm LV PW:         1.30 cm LV IVS:        1.20 cm LVOT diam:     2.00 cm LV SV:         46 LV SV Index:   19 LVOT Area:     3.14 cm  RIGHT VENTRICLE          IVC RV Basal diam:  3.40 cm  IVC diam: 2.10 cm LEFT ATRIUM              Index  RIGHT ATRIUM           Index LA diam:        4.70 cm  1.88 cm/m   RA Area:     18.80 cm LA Vol (A2C):   101.0 ml 40.34 ml/m  RA Volume:   48.40 ml  19.33 ml/m LA Vol (A4C):   88.9 ml  35.51 ml/m LA Biplane Vol: 97.7 ml  39.02 ml/m  AORTIC VALVE LVOT Vmax:   94.00 cm/s LVOT Vmean:  63.600 cm/s LVOT VTI:    0.148 m  AORTA Ao Root diam: 3.60 cm  Ao Asc diam:  4.00 cm  SHUNTS Systemic VTI:  0.15 m Systemic Diam: 2.00 cm Donato Schultz MD Electronically signed by Donato Schultz MD Signature Date/Time: 01/19/2023/4:11:44 PM    Final         Scheduled Meds:  amLODipine  5 mg Oral Daily   apixaban  5 mg Oral BID   ascorbic acid  500 mg Oral Daily   atorvastatin  80 mg Oral Daily   carvedilol  25 mg Oral BID WC   doxazosin  8 mg Oral Daily   dronedarone  400 mg Oral BID   feeding supplement (GLUCERNA SHAKE)  237 mL Oral BID BM   furosemide  40 mg Oral Daily   insulin aspart  0-15 Units Subcutaneous TID WC   insulin aspart  0-5 Units Subcutaneous QHS   isosorbide mononitrate  120 mg Oral Daily   multivitamin with minerals  1 tablet Oral Daily   polyethylene glycol  17 g Oral Daily   zinc sulfate (50mg  elemental zinc)  220 mg Oral Daily   Continuous Infusions:  ceFEPime (MAXIPIME) IV 2 g (01/20/23 0558)   linezolid (ZYVOX) IV 600 mg (01/19/23 2257)     LOS: 3 days    Time spent: 35 minutes    Juliany Daughety A Zarya Lasseigne, MD [facility]   If 7PM-7AM, please contact night-coverage [email or web address]  01/20/2023, 7:57 AM

## 2023-01-20 NOTE — Plan of Care (Signed)
  Problem: Safety: Goal: Ability to remain free from injury will improve Outcome: Progressing   Problem: Skin Integrity: Goal: Risk for impaired skin integrity will decrease Outcome: Progressing   

## 2023-01-20 NOTE — Progress Notes (Signed)
PT Cancellation Note  Patient Details Name: Justin Molina. MRN: [id number] DOB: 04-21-1960   Cancelled Treatment:    Reason Eval/Treat Not Completed: Patient declined, no reason specified;Fatigue/lethargy limiting ability to participate Pt had returned from CT and spouse in room.  Spouse stating pt had just vomited and received Zofran.  Pt requested to nap for an hour.  Returned an hour later however pt had not had his nap yet and LE dressing removed by MD.  Spouse reporting now not a good time.  Will check back as schedule permits.   Janan Halter [place] 01/20/2023, 11:17 AM Paulino Door, DPT Physical Therapist Acute Rehabilitation Services Office: [phone]

## 2023-01-20 NOTE — Plan of Care (Signed)
Shift summary: CT head completed. Abd XR completed d/t ep of N/V. No further c/o N/V since then. Wound care done RLE after MD assessed wound. Wrapped in compression wrap to promote circulation. RLE elevated to level of heart using pillows. RN encouraged pt to drink Ensure protein shakes to promote wound healing. Pt compliant w/ this request. Plan to work w/ PT/OT this afternoon. P evaluation by wound care RN, hopefully tomorrow. Problem: Education: Goal: Knowledge of General Education information will improve Description: Including pain rating scale, medication(s)/side effects and non-pharmacologic comfort measures Outcome: Progressing   Problem: Health Behavior/Discharge Planning: Goal: Ability to manage health-related needs will improve Outcome: Progressing   Problem: Clinical Measurements: Goal: Ability to maintain clinical measurements within normal limits will improve Outcome: Progressing Goal: Will remain free from infection Outcome: Progressing Goal: Diagnostic test results will improve Outcome: Progressing Goal: Respiratory complications will improve Outcome: Progressing Goal: Cardiovascular complication will be avoided Outcome: Progressing   Problem: Activity: Goal: Risk for activity intolerance will decrease Outcome: Progressing   Problem: Nutrition: Goal: Adequate nutrition will be maintained Outcome: Progressing   Problem: Coping: Goal: Level of anxiety will decrease Outcome: Progressing   Problem: Elimination: Goal: Will not experience complications related to bowel motility Outcome: Progressing Goal: Will not experience complications related to urinary retention Outcome: Progressing   Problem: Pain Management: Goal: General experience of comfort will improve Outcome: Progressing   Problem: Safety: Goal: Ability to remain free from injury will improve Outcome: Progressing   Problem: Skin Integrity: Goal: Risk for impaired skin integrity will  decrease Outcome: Progressing   Problem: Education: Goal: Ability to describe self-care measures that may prevent or decrease complications (Diabetes Survival Skills Education) will improve Outcome: Progressing Goal: Individualized Educational Video(s) Outcome: Progressing   Problem: Coping: Goal: Ability to adjust to condition or change in health will improve Outcome: Progressing   Problem: Fluid Volume: Goal: Ability to maintain a balanced intake and output will improve Outcome: Progressing   Problem: Health Behavior/Discharge Planning: Goal: Ability to identify and utilize available resources and services will improve Outcome: Progressing Goal: Ability to manage health-related needs will improve Outcome: Progressing   Problem: Metabolic: Goal: Ability to maintain appropriate glucose levels will improve Outcome: Progressing   Problem: Nutritional: Goal: Maintenance of adequate nutrition will improve Outcome: Progressing Goal: Progress toward achieving an optimal weight will improve Outcome: Progressing   Problem: Skin Integrity: Goal: Risk for impaired skin integrity will decrease Outcome: Progressing   Problem: Tissue Perfusion: Goal: Adequacy of tissue perfusion will improve Outcome: Progressing

## 2023-01-20 NOTE — Evaluation (Signed)
Physical Therapy Evaluation Patient Details Name: Justin Molina. MRN: [id number] DOB: [birth year] Today's Date: 01/20/2023  History of Present Illness  Justin Molina is a 62 yr old male admitted to the hospital 01-17-23 with fevers, R LE wound, and R LE edema. Pt found to have sepsis due to R LE wound and cellulitis. PMH: CVA with R hemiparesis and aphasia, DM II, CHF, OSA, HTN, brain surgery, hernia repair  Clinical Impression  The pt is currently limited by the below listed deficits, which compromise his overall functional independence. He has a history of CVA from ~15 yrs ago with resultant R sided weakness (weakness is worse in his RUE as compared to RLE) and aphasia. During the therapy session today, he required max assist for lower body dressing, mod assist for upper body dressing, min assist to stand from an elevated bed surface requiring LUE support on RW, and min assist for short distance ambulation using a RW. He was noted to be with acute deconditioning and generalized weakness, requiring increased time and effort for certain tasks such as sit to stand - wife reports this has been a struggle at home as well and pt would benefit from lift chair in home.  He was very motivated to participate in the session, as well as to require as little physical assistance of another as possible. Overall, he appears motivated to be as active as possible and to achieve his prior level of functioning. He will benefit from further PT services to maximize his independence with self-care tasks and to decrease the risk for restricted participation in meaningful activities. Patient will benefit from intensive inpatient follow up therapy, >3 hours/day       If plan is discharge home, recommend the following: A lot of help with walking and/or transfers;A little help with bathing/dressing/bathroom;Assistance with cooking/housework;Assist for transportation;Help with stairs or ramp for entrance   Can travel by  private vehicle        Equipment Recommendations None recommended by PT  Recommendations for Other Services       Functional Status Assessment Patient has had a recent decline in their functional status and demonstrates the ability to make significant improvements in function in a reasonable and predictable amount of time.     Precautions / Restrictions Precautions Precautions: Fall Restrictions Weight Bearing Restrictions Per Provider Order: No Other Position/Activity Restrictions: Old R sided weakness and aphasis due to CVA      Mobility  Bed Mobility Overal bed mobility: Needs Assistance Bed Mobility: Supine to Sit, Sit to Supine     Supine to sit: Min assist, Used rails, HOB elevated Sit to supine: Mod assist, HOB elevated, Used rails   General bed mobility comments: Increased time, use of bedrails; Increased assist to bring LEs up onto bed    Transfers Overall transfer level: Needs assistance Equipment used: Rolling walker (2 wheels) Transfers: Sit to/from Stand Sit to Stand: Min assist, From elevated surface                Ambulation/Gait Ambulation/Gait assistance: Min assist Gait Distance (Feet): 22 Feet Assistive device: Rolling walker (2 wheels) Gait Pattern/deviations: Step-to pattern, Decreased step length - right, Decreased step length - left, Shuffle, Trunk flexed Gait velocity: decr     General Gait Details: Min knee flexion and noted cirucumduction to advance R LE; Pt with L hand manouvering RW with intermittent assist needed correct trajectory.  Stairs            Psychologist, prison and probation services  Tilt Bed    Modified Rankin (Stroke Patients Only)       Balance Overall balance assessment: Needs assistance Sitting-balance support: No upper extremity supported, Feet supported Sitting balance-Leahy Scale: Good Sitting balance - Comments: static sitting-good. dynamic sitting-fair+   Standing balance support: Single extremity  supported Standing balance-Leahy Scale: Poor Standing balance comment: Min assist with RW                             Pertinent Vitals/Pain Pain Assessment Pain Assessment: No/denies pain    Home Living Family/patient expects to be discharged to:: Private residence Living Arrangements: Spouse/significant other Available Help at Discharge: Family Type of Home: Apartment Home Access: Level entry       Home Layout: One level Home Equipment: Tub bench;Wheelchair - manual;BSC/3in1;Wheelchair - power;Hospital bed;Other (comment)      Prior Function Prior Level of Function : Independent/Modified Independent             Mobility Comments: The pt used a RLE AFO and hemi-walker for household ambulation. He has been receiving home health OT and PT services. ADLs Comments: The pt's spouse assisted with providing information regarding the pt's prior level of functioning and living situation, due to the pt's expressive aphasia. The pt lives with his spouse in an apartment, he fed himself without assist using his L UE, he required increased time and effort for dressing though he could typically complete himself, his spouse provided light assistance for tub transfers, the pt used a urinal for voiding urine which he could empty independently, and he managed toileting without assist though his spouse stated he had occasional difficulty with thoroughness after having a bowel movement. The pt did not cook on the stove, however could warm food in the microwave, as well as fix simple things like a bowel of cereal or sandwich. He does not drive.     Extremity/Trunk Assessment   Upper Extremity Assessment Upper Extremity Assessment: Left hand dominant RUE Deficits / Details: Pt was R UE dominant prior to his CVA. No functional AROM or strength noted in R UE/severe chronic weakness. Pt denied numbness of R UE. LUE Deficits / Details: AROM WFL. Gross strength at least 3+/5 based on  observation of functional abilities    Lower Extremity Assessment Lower Extremity Assessment: RLE deficits/detail RLE Deficits / Details: Min DF with hip and knee 2+/5       Communication   Communication Communication: Difficulty communicating thoughts/reduced clarity of speech  Cognition Arousal: Alert Behavior During Therapy: WFL for tasks assessed/performed, Impulsive Overall Cognitive Status: Difficult to assess Area of Impairment: Awareness, Safety/judgement                         Safety/Judgement: Decreased awareness of safety, Decreased awareness of deficits     General Comments: Follows 1-2 step commands with occasional repetition, able to answer yes/no questions though his spouse stated sometimes his responses may not be accurate, very motivated to participate in therapy        General Comments      Exercises     Assessment/Plan    PT Assessment Patient needs continued PT services  PT Problem List Decreased strength;Decreased range of motion;Decreased activity tolerance;Decreased balance;Decreased mobility;Decreased coordination;Decreased cognition;Decreased knowledge of use of DME;Decreased safety awareness;Obesity       PT Treatment Interventions DME instruction;Gait training;Stair training;Functional mobility training;Therapeutic activities;Therapeutic exercise;Balance training;Patient/family education    PT Goals (Current  goals can be found in the Care Plan section)  Acute Rehab PT Goals Patient Stated Goal: Regain as much IND as possible PT Goal Formulation: With patient/family Time For Goal Achievement: 02/03/23 Potential to Achieve Goals: Good    Frequency Min 1X/week     Co-evaluation PT/OT/SLP Co-Evaluation/Treatment: Yes Reason for Co-Treatment: For patient/therapist safety;To address functional/ADL transfers PT goals addressed during session: Mobility/safety with mobility OT goals addressed during session: ADL's and self-care        AM-PAC PT "6 Clicks" Mobility  Outcome Measure Help needed turning from your back to your side while in a flat bed without using bedrails?: A Little Help needed moving from lying on your back to sitting on the side of a flat bed without using bedrails?: A Little Help needed moving to and from a bed to a chair (including a wheelchair)?: A Little Help needed standing up from a chair using your arms (e.g., wheelchair or bedside chair)?: A Little Help needed to walk in hospital room?: A Little Help needed climbing 3-5 steps with a railing? : A Lot 6 Click Score: 17    End of Session Equipment Utilized During Treatment: Gait belt Activity Tolerance: Patient tolerated treatment well Patient left: in bed;with call bell/phone within reach;with bed alarm set;with family/visitor present Nurse Communication: Mobility status PT Visit Diagnosis: Unsteadiness on feet (R26.81);Hemiplegia and hemiparesis Hemiplegia - Right/Left: Right Hemiplegia - dominant/non-dominant: Dominant    Time: 1545-1630 PT Time Calculation (min) (ACUTE ONLY): 45 min   Charges:   PT Evaluation $PT Eval Moderate Complexity: 1 Mod PT Treatments $Gait Training: 8-22 mins PT General Charges $$ ACUTE PT VISIT: 1 Visit         Mauro Kaufmann PT Acute Rehabilitation Services Pager [phone] Office [phone]   [facility] 01/20/2023, 5:33 PM

## 2023-01-20 NOTE — Evaluation (Signed)
Occupational Therapy Evaluation Patient Details Name: Justin Molina. MRN: [id number] DOB: Jan 12, 1961 Today's Date: 01/20/2023   History of Present Illness [name] is a 62 yr old male admitted to the hospital 01-17-23 with fevers, R LE wound, and R LE edema. Pt found to have sepsis due to R LE wound and cellulitis. PMH: CVA with R hemiparesis and aphasia, DM II, CHF, OSA, HTN, brain surgery, hernia repair   Clinical Impression   The pt is currently limited by the below listed deficits, which compromise his ADL performance and overall functional independence (see OT problem list). He has a history of CVA from ~15 yrs ago with resultant R sided weakness (weakness is worse in his RUE as compared to RLE) and aphasia. During the therapy session today, he required max assist for lower body dressing, mod assist for upper body dressing, min assist to stand from an elevated bed surface requiring LUE support on RW, and min assist for short distance ambulation using a RW. He was noted to be with acute deconditioning and generalized weakness, requiring increased time and effort for certain such as sit to stand. He was very motivated to participate in the session, as well as to require as little physical assistance of another as possible. Overall, he appears motivated to be as active as possible and to achieve his prior level of functioning. He will benefit from further OT services to maximize his independence with self-care tasks and to decrease the risk for restricted participation in meaningful activities. Patient will benefit from intensive inpatient follow up therapy, >3 hours/day.       If plan is discharge home, recommend the following: Assist for transportation;Assistance with cooking/housework;A lot of help with bathing/dressing/bathroom;A little help with walking and/or transfers    Functional Status Assessment  Patient has had a recent decline in their functional status and demonstrates the  ability to make significant improvements in function in a reasonable and predictable amount of time.  Equipment Recommendations  Other (comment) (Lift chair)    Recommendations for Other Services       Precautions / Restrictions Precautions Precautions: Fall Restrictions Weight Bearing Restrictions Per Provider Order: No Other Position/Activity Restrictions: Old R sided weakness and aphasis due to CVA from ~15 yrs ago     Mobility Bed Mobility Overal bed mobility: Needs Assistance Bed Mobility: Supine to Sit, Sit to Supine     Supine to sit: Min assist, Used rails, HOB elevated Sit to supine: Mod assist, HOB elevated, Used rails (required assist for B LE onto bed)        Transfers Overall transfer level: Needs assistance Equipment used: Rolling walker (2 wheels) (LUE placed on walker) Transfers: Sit to/from Stand Sit to Stand: Min assist, From elevated surface (Bed elevated significantly. Pt unable to clear buttocks off bed on first few standing attempts. Required increased time and effort, prior to achieving standing, however pt motivated to perform without the need for assistance)                  Balance       Sitting balance - Comments: static sitting-good. dynamic sitting-fair+       Standing balance comment: Min assist with RW           ADL either performed or assessed with clinical judgement   ADL Overall ADL's : Needs assistance/impaired Eating/Feeding: Set up;Sitting   Grooming: Minimal assistance;Sitting           Upper Body Dressing : Moderate assistance;Sitting  Upper Body Dressing Details (indicate cue type and reason): assist needed to don hospital gown around his back seated EOB Lower Body Dressing: Maximal assistance;Sit to/from stand Lower Body Dressing Details (indicate cue type and reason): required significant assist to don socks seated EOB                      Pertinent Vitals/Pain Pain Assessment Pain Assessment:   (The pt stated "no" when asked if he was having pain.)     Extremity/Trunk Assessment Upper Extremity Assessment Upper Extremity Assessment: Left hand dominant;RUE deficits/detail;LUE deficits/detail RUE Deficits / Details: Pt was R UE dominant prior to his CVA. No functional AROM or strength noted in R UE/severe chronic weakness. Pt denied numbness of R UE. LUE Deficits / Details: AROM WFL. Gross strength at least 3+/5 based on observation of functional abilities           Communication Communication Communication: Difficulty communicating thoughts/reduced clarity of speech (History of expressive aphasia with occasional receptive deficits as well.)   Cognition Arousal: Alert Behavior During Therapy: WFL for tasks assessed/performed Overall Cognitive Status: Difficult to assess Area of Impairment: Awareness, Safety/judgement        Safety/Judgement: Decreased awareness of safety, Decreased awareness of deficits     General Comments: Follows 1-2 step commands with occasional repetition, able to answer yes/no questions though his spouse stated sometimes his responses may not be accurate, very motivated to participate in therapy                Home Living Family/patient expects to be discharged to:: Private residence Living Arrangements: Spouse/significant other Available Help at Discharge: Family Type of Home: Apartment Home Access: Level entry     Home Layout: One level     Bathroom Shower/Tub: Chief Strategy Officer: Handicapped height     Home Equipment: Tub bench;Wheelchair - manual;BSC/3in1;Wheelchair - power;Hospital bed;Other (comment) (Hemi-walker)          Prior Functioning/Environment Prior Level of Function : Independent/Modified Independent             Mobility Comments: The pt used a RLE AFO and hemi-walker for household ambulation. He has been receiving home health OT and PT services. ADLs Comments: The pt's spouse assisted with  providing information regarding the pt's prior level of functioning and living situation, due to the pt's expressive aphasia. The pt lives with his spouse in an apartment, he fed himself without assist using his L UE, he required increased time and effort for dressing though he could typically complete himself, his spouse provided light assistance for tub transfers, the pt used a urinal for voiding urine which he could empty independently, and he managed toileting without assist though his spouse stated he had occasional difficulty with thoroughness after having a bowel movement. The pt did not cook on the stove, however could warm food in the microwave, as well as fix simple things like a bowel of cereal or sandwich. He does not drive.        OT Problem List: Decreased strength;Decreased range of motion;Impaired balance (sitting and/or standing);Decreased coordination;Decreased safety awareness;Impaired tone;Impaired UE functional use      OT Treatment/Interventions: Self-care/ADL training;Therapeutic exercise;Neuromuscular education;Energy conservation;DME and/or AE instruction;Balance training;Patient/family education;Therapeutic activities    OT Goals(Current goals can be found in the care plan section) Acute Rehab OT Goals OT Goal Formulation: With patient/family Time For Goal Achievement: 02/03/23 Potential to Achieve Goals: Good ADL Goals Pt Will Perform Upper Body Dressing:  sitting;with set-up Pt Will Perform Lower Body Dressing: sitting/lateral leans;sit to/from stand;with supervision Pt Will Transfer to Toilet: with supervision;ambulating;grab bars Pt Will Perform Toileting - Clothing Manipulation and hygiene: with supervision;sit to/from stand  OT Frequency:      Co-evaluation PT/OT/SLP Co-Evaluation/Treatment: Yes Reason for Co-Treatment: For patient/therapist safety;To address functional/ADL transfers PT goals addressed during session: Mobility/safety with mobility OT goals  addressed during session: ADL's and self-care      AM-PAC OT "6 Clicks" Daily Activity     Outcome Measure Help from another person eating meals?: A Little Help from another person taking care of personal grooming?: A Little Help from another person toileting, which includes using toliet, bedpan, or urinal?: A Lot Help from another person bathing (including washing, rinsing, drying)?: A Lot Help from another person to put on and taking off regular upper body clothing?: A Lot Help from another person to put on and taking off regular lower body clothing?: A Lot 6 Click Score: 14   End of Session Equipment Utilized During Treatment: Gait belt;Rolling walker (2 wheels) Nurse Communication: Other (comment) (nurse cleared pt for therapy participation)  Activity Tolerance: Patient tolerated treatment well Patient left: in bed;with call bell/phone within reach;with bed alarm set;with family/visitor present  OT Visit Diagnosis: Unsteadiness on feet (R26.81);Other abnormalities of gait and mobility (R26.89);Muscle weakness (generalized) (M62.81);Hemiplegia and hemiparesis                Time: 1543-1630 OT Time Calculation (min): 47 min Charges:  OT General Charges $OT Visit: 1 Visit OT Evaluation $OT Eval Moderate Complexity: 1 Mod    Adrienne Trombetta L Yuki Brunsman, OTR/L 01/20/2023, 5:12 PM

## 2023-01-21 DIAGNOSIS — N179 Acute kidney failure, unspecified: Secondary | ICD-10-CM

## 2023-01-21 DIAGNOSIS — A419 Sepsis, unspecified organism: Secondary | ICD-10-CM | POA: Diagnosis not present

## 2023-01-21 DIAGNOSIS — R6520 Severe sepsis without septic shock: Secondary | ICD-10-CM | POA: Diagnosis not present

## 2023-01-21 LAB — GLUCOSE, CAPILLARY
Glucose-Capillary: 97 mg/dL (ref 70–99)
Glucose-Capillary: 110 mg/dL — ABNORMAL HIGH (ref 70–99)
Glucose-Capillary: 95 mg/dL (ref 70–99)
Glucose-Capillary: 99 mg/dL (ref 70–99)

## 2023-01-21 MED ORDER — DICLOFENAC SODIUM 1 % EX GEL
2.0000 g | Freq: Four times a day (QID) | CUTANEOUS | Status: DC
  Administered 2023-01-21 – 2023-01-25 (×13): 2 g via TOPICAL
  Filled 2023-01-21: qty 100

## 2023-01-21 MED ORDER — JUVEN PO PACK
1.0000 | PACK | Freq: Two times a day (BID) | ORAL | Status: DC
  Administered 2023-01-22 – 2023-01-25 (×3): 1 via ORAL
  Filled 2023-01-21 (×3): qty 1

## 2023-01-21 NOTE — Progress Notes (Signed)
Physical Therapy Treatment Patient Details Name: Justin Molina. MRN: [id number] DOB: 04/11/1960 Today's Date: 01/21/2023   History of Present Illness Justin Molina is a 62 yr old male admitted to the hospital 01-17-23 with fevers, R LE wound, and R LE edema. Pt found to have sepsis due to R LE wound and cellulitis. PMH: CVA with R hemiparesis and aphasia, DM II, CHF, OSA, HTN, brain surgery, hernia repair    PT Comments  Pt is pleasant and very motivated to progress with mobility, he puts forth good effort. Pt reports L knee pain which he states is new as of today, he rated pain as 5/10. Sit to stand from highly elevated bed required ~5 attempts before he was able to successfully power up. Pt then ambulated 16' with RW with min A to steady/manage walker as he's not able to grip RW with RUE, no loss of balance with ambulation. Attempted L knee extension AROM in sitting, however pt reported much pain in L knee with this so did not attempt further. Heat applied to L knee at end of session.    If plan is discharge home, recommend the following: A lot of help with walking and/or transfers;A little help with bathing/dressing/bathroom;Assistance with cooking/housework;Assist for transportation;Help with stairs or ramp for entrance   Can travel by private vehicle        Equipment Recommendations  None recommended by PT    Recommendations for Other Services       Precautions / Restrictions Precautions Precautions: Fall Restrictions Weight Bearing Restrictions Per Provider Order: No Other Position/Activity Restrictions: Old R sided weakness and aphasia due to CVA     Mobility  Bed Mobility Overal bed mobility: Needs Assistance Bed Mobility: Supine to Sit, Sit to Supine     Supine to sit: Min assist, Used rails, HOB elevated Sit to supine: Mod assist, HOB elevated, Used rails   General bed mobility comments: Increased time, use of bedrails; assist for RLE OOB and for BLEs back into  bed    Transfers Overall transfer level: Needs assistance Equipment used: Rolling walker (2 wheels) Transfers: Sit to/from Stand Sit to Stand: Min assist, From elevated surface           General transfer comment: bed highly elevated, pt used momentum, required 4-5 attempts before he could successfully power up to standing    Ambulation/Gait Ambulation/Gait assistance: Min assist Gait Distance (Feet): 16 Feet Assistive device: Rolling walker (2 wheels) Gait Pattern/deviations: Step-to pattern, Decreased step length - right, Decreased step length - left, Shuffle, Trunk flexed Gait velocity: decr     General Gait Details: Min knee flexion and noted cirucumduction to advance R LE; Pt with L hand manouvering RW with intermittent assist to steady RW (pt not able to functionally use RUE); distance limited by L knee pain, no loss of balance   Stairs             Wheelchair Mobility     Tilt Bed    Modified Rankin (Stroke Patients Only)       Balance Overall balance assessment: Needs assistance Sitting-balance support: No upper extremity supported, Feet supported Sitting balance-Leahy Scale: Good Sitting balance - Comments: static sitting-good. dynamic sitting-fair+   Standing balance support: Single extremity supported, During functional activity, Reliant on assistive device for balance Standing balance-Leahy Scale: Poor  Cognition Arousal: Alert Behavior During Therapy: WFL for tasks assessed/performed Overall Cognitive Status: Difficult to assess                                 General Comments: h/o expressive aphasia 2* CVA, follows 1-2 step commands well, answers yes/no questions with increased time, uses gestures and a few words but often requires increased time to get correct word out.        Exercises      General Comments        Pertinent Vitals/Pain Pain Assessment Pain Assessment:  0-10 Pain Score: 5  Pain Location: L knee (pt reports onset was today) Pain Descriptors / Indicators: Guarding, Grimacing Pain Intervention(s): Limited activity within patient's tolerance, Monitored during session, Heat applied    Home Living                          Prior Function            PT Goals (current goals can now be found in the care plan section) Acute Rehab PT Goals Patient Stated Goal: Regain as much IND as possible PT Goal Formulation: With patient/family Time For Goal Achievement: 02/03/23 Potential to Achieve Goals: Good Progress towards PT goals: Progressing toward goals    Frequency    Min 1X/week      PT Plan      Co-evaluation              AM-PAC PT "6 Clicks" Mobility   Outcome Measure  Help needed turning from your back to your side while in a flat bed without using bedrails?: A Little Help needed moving from lying on your back to sitting on the side of a flat bed without using bedrails?: A Little Help needed moving to and from a bed to a chair (including a wheelchair)?: A Little Help needed standing up from a chair using your arms (e.g., wheelchair or bedside chair)?: A Lot Help needed to walk in hospital room?: A Little Help needed climbing 3-5 steps with a railing? : A Lot 6 Click Score: 16    End of Session Equipment Utilized During Treatment: Gait belt Activity Tolerance: Patient tolerated treatment well Patient left: in bed;with call bell/phone within reach;with bed alarm set Nurse Communication: Mobility status PT Visit Diagnosis: Unsteadiness on feet (R26.81);Hemiplegia and hemiparesis;Pain Hemiplegia - Right/Left: Right Hemiplegia - dominant/non-dominant: Dominant     Time: 6578-4696 PT Time Calculation (min) (ACUTE ONLY): 33 min  Charges:    $Gait Training: 8-22 mins $Therapeutic Activity: 8-22 mins PT General Charges $$ ACUTE PT VISIT: 1 Visit                     Justin Molina PT  01/21/2023  Acute Rehabilitation Services  Office [phone]

## 2023-01-21 NOTE — Progress Notes (Signed)
PROGRESS NOTE    Justin Molina.  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  Per wife wound was the size of a nickel about a month ago and has rapidly grown.  Patient has been having a lot of swelling.  He has had multiple changes to his medication recently.  She developed fever and vomiting.  Evaluation in the ED consistent with sepsis.    Assessment & Plan:   Principal Problem:   Sepsis (HCC) Active Problems:   DM (diabetes mellitus), type 2 with renal complications (HCC)   History of CVA with residual deficit   CKD (chronic kidney disease) stage 3, GFR 30-59 ml/min (HCC)   OSA (obstructive sleep apnea)   Cellulitis  Sepsis secondary to Right lower extremity wound with cellulitis He was dressed up again today by wound care earlier today before I even saw him so I did not unwrap him.  I will continue current IV antibiotics.   2-Diabetes type 2 Hold Januvia and Ozempic.  SSI.  Blood sugar controlled.    3-Nausea, vomiting.  Symptoms resolved.  KUB negative.   History of A-fib: Continue with Eliquis Resume carvedilol.  Resume Moltaq 12/14   4-History of CVA with residual deficits: Patient has dysarthria at baseline right hemiparesis.  Walk with a cane   CKD stage IIIb: Previous Cr range 1.9--1.6 Monitor on lasix.  Stable.  Continue Lasix.   Hypokalemia; resolved.   CAD; continue with imdur.  Resume lasix.  ECHO: Normal EF   HTN; blood pressure labile, continue with norvasc, hydralazine, lasix, carvedilol.    Acute metabolic Encephalopathy; -CT head unremarkable.  Appears to be resolved and at baseline.   Dry eye; continue artificial tear.  Left knee pain: Complains of  left knee pain which is tender to palpation but does not have any signs of any effusion, cellulitis or gout.  Could be arthritis.  No calf tenderness.  Doubt DVT.  Already on Eliquis. will order Voltaren gel 4 times daily.  DVT prophylaxis: Eliquis   Code Status: Full Code  Family Communication:  None present at bedside.  Plan of care discussed with patient in length and he/she verbalized understanding and agreed with it.  Status is: Inpatient Remains inpatient appropriate because: Needs IV antibiotics.   Estimated body mass index is 41.79 kg/m as calculated from the following:   Height as of this encounter: 5\' 11"  (1.803 m).   Weight as of this encounter: 135.9 kg.    Nutritional Assessment: Body mass index is 41.79 kg/m.Marland Kitchen Seen by dietician.  I agree with the assessment and plan as outlined below: Nutrition Status:        . Skin Assessment: I have examined the patient's skin and I agree with the wound assessment as performed by the wound care RN as outlined below:    Consultants:  None  Procedures:  None  Antimicrobials:  Anti-infectives (From admission, onward)    Start     Dose/Rate Route Frequency Ordered Stop   01/19/23 1400  ceFEPIme (MAXIPIME) 2 g in sodium chloride 0.9 % 100 mL IVPB        2 g 200 mL/hr over 30 Minutes Intravenous Every 8 hours 01/19/23 0837     01/19/23 1000  linezolid (ZYVOX) IVPB  600 mg        600 mg 300 mL/hr over 60 Minutes Intravenous Every 12 hours 01/19/23 0804     01/19/23 0500  vancomycin (VANCOREADY) IVPB 1750 mg/350 mL  Status:  Discontinued        1,750 mg 175 mL/hr over 120 Minutes Intravenous Every 36 hours 01/18/23 0112 01/18/23 0936   01/18/23 1800  vancomycin (VANCOREADY) IVPB 1250 mg/250 mL  Status:  Discontinued        1,250 mg 166.7 mL/hr over 90 Minutes Intravenous Every 24 hours 01/18/23 0936 01/19/23 0804   01/18/23 0500  ceFEPIme (MAXIPIME) 2 g in sodium chloride 0.9 % 100 mL IVPB  Status:  Discontinued        2  g 200 mL/hr over 30 Minutes Intravenous Every 12 hours 01/18/23 0105 01/19/23 0837   01/17/23 1800  vancomycin (VANCOCIN) IVPB 1000 mg/200 mL premix       Placed in "Followed by" Linked Group   1,000 mg 200 mL/hr over 60 Minutes Intravenous  Once 01/17/23 1649 01/17/23 1910   01/17/23 1700  vancomycin (VANCOCIN) IVPB 1000 mg/200 mL premix       Placed in "Followed by" Linked Group   1,000 mg 200 mL/hr over 60 Minutes Intravenous  Once 01/17/23 1649 01/17/23 1810   01/17/23 1630  ceFEPIme (MAXIPIME) 2 g in sodium chloride 0.9 % 100 mL IVPB        2 g 200 mL/hr over 30 Minutes Intravenous  Once 01/17/23 1617 01/17/23 1700         Subjective: Seen and examined.  Has dysarthria but he is able to communicate his symptoms.  Complains of left knee pain.  No other complaint.  Objective: Vitals:   01/20/23 0522 01/20/23 1422 01/20/23 2023 01/21/23 0535  BP: (!) 165/98 136/81 (!) 154/82 (!) 153/115  Pulse: 73 78 (!) 106 (!) 109  Resp:  (!) 24 19 16   Temp: 98 F (36.7 C) (!) 97.5 F (36.4 C) 98.8 F (37.1 C) 99 F (37.2 C)  TempSrc: Oral Oral Oral Oral  SpO2: 93% 97% 95% 96%  Weight:      Height:        Intake/Output Summary (Last 24 hours) at 01/21/2023 0948 Last data filed at 01/21/2023 0915 Gross per 24 hour  Intake 2434.95 ml  Output 1800 ml  Net 634.95 ml   Filed Weights   01/17/23 1515 01/17/23 2120 01/18/23 0519  Weight: 132 kg 134.7 kg 135.9 kg    Examination:  General exam: Appears calm and comfortable  Respiratory system: Clear to auscultation. Respiratory effort normal. Cardiovascular system: S1 & S2 heard, RRR. No JVD, murmurs, rubs, gallops or clicks. No pedal edema. Gastrointestinal system: Abdomen is nondistended, soft and nontender. No organomegaly or masses felt. Normal bowel sounds heard. Central nervous system: Alert and oriented.  Right hemiparesis from previous stroke and dysarthria. Skin: No rashes, lesions or ulcers  Data Reviewed: I have  personally reviewed following labs and imaging studies  CBC: Recent Labs  Lab 01/17/23 1527 01/18/23 0513 01/19/23 0509 01/20/23 0911  WBC 10.6* 9.3 9.0 8.1  NEUTROABS 8.4*  --   --   --   HGB 12.1* 10.9* 11.5* 11.6*  HCT 36.5* 34.9* 34.7* 34.7*  MCV 81.8 85.3 83.6 83.4  PLT 292 292 286 292   Basic Metabolic Panel: Recent Labs  Lab 01/17/23 1527 01/18/23 0513 01/19/23 0509 01/20/23 0911  NA 139 139 137 138  K 3.3* 3.2* 3.1* 3.6  CL  104 107 105 107  CO2 25 24 25 25   GLUCOSE 101* 86 93 151*  BUN 22 18 16 18   CREATININE 2.17* 1.88* 1.66* 1.53*  CALCIUM 7.9* 7.5* 7.3* 7.7*   GFR: Estimated Creatinine Clearance: 70.5 mL/min (A) (by C-G formula based on SCr of 1.53 mg/dL (H)). Liver Function Tests: Recent Labs  Lab 01/17/23 1527  AST 24  ALT 14  ALKPHOS 60  BILITOT 0.8  PROT 8.6*  ALBUMIN 3.1*   No results for input(s): "LIPASE", "AMYLASE" in the last 168 hours. No results for input(s): "AMMONIA" in the last 168 hours. Coagulation Profile: No results for input(s): "INR", "PROTIME" in the last 168 hours. Cardiac Enzymes: No results for input(s): "CKTOTAL", "CKMB", "CKMBINDEX", "TROPONINI" in the last 168 hours. BNP (last 3 results) No results for input(s): "PROBNP" in the last 8760 hours. HbA1C: No results for input(s): "HGBA1C" in the last 72 hours. CBG: Recent Labs  Lab 01/20/23 0727 01/20/23 1144 01/20/23 1633 01/20/23 1951 01/21/23 0732  GLUCAP 91 131* 111* 154* 97   Lipid Profile: No results for input(s): "CHOL", "HDL", "LDLCALC", "TRIG", "CHOLHDL", "LDLDIRECT" in the last 72 hours. Thyroid Function Tests: No results for input(s): "TSH", "T4TOTAL", "FREET4", "T3FREE", "THYROIDAB" in the last 72 hours. Anemia Panel: Recent Labs    01/20/23 0911  VITAMINB12 620   Sepsis Labs: Recent Labs  Lab 01/17/23 1529  LATICACIDVEN 1.2    Recent Results (from the past 240 hours)  Blood culture (routine x 2)     Status: None (Preliminary result)    Collection Time: 01/17/23  3:27 PM   Specimen: BLOOD  Result Value Ref Range Status   Specimen Description   Final    BLOOD LEFT ANTECUBITAL Performed at [facility], [address] [place], [place] [address]    Special Requests   Final    BOTTLES DRAWN AEROBIC AND ANAEROBIC Blood Culture results may not be optimal due to an inadequate volume of blood received in culture bottles Performed at [facility], [address] [place], [place] [address]    Culture   Final    NO GROWTH 4 DAYS Performed at [facility], [address], [place], [place] [address]    Report Status PENDING  Incomplete  Resp panel by RT-PCR (RSV, Flu A&B, Covid) Anterior Nasal Swab     Status: None   Collection Time: 01/17/23  3:28 PM   Specimen: Anterior Nasal Swab  Result Value Ref Range Status   SARS Coronavirus 2 by RT PCR NEGATIVE NEGATIVE Final    Comment: (NOTE) SARS-CoV-2 target nucleic acids are NOT DETECTED.  The SARS-CoV-2 RNA is generally detectable in upper respiratory specimens during the acute phase of infection. The lowest concentration of SARS-CoV-2 viral copies this assay can detect is 138 copies/mL. A negative result does not preclude SARS-Cov-2 infection and should not be used as the sole basis for treatment or other patient management decisions. A negative result may occur with  improper specimen collection/handling, submission of specimen other than nasopharyngeal swab, presence of viral mutation(s) within the areas targeted by this assay, and inadequate number of viral copies(<138 copies/mL). A negative result must be combined with clinical observations, patient history, and epidemiological information. The expected result is Negative.  Fact Sheet for Patients:  BloggerCourse.com  Fact Sheet for Healthcare Providers:  SeriousBroker.it  This test is no t yet approved or cleared by the [place] FDA  and  has been authorized for detection  and/or diagnosis of SARS-CoV-2 by FDA under an Emergency Use Authorization (EUA). This EUA will remain  in effect (meaning this test can be used) for the duration of the COVID-19 declaration under Section 564(b)(1) of the Act, 21 U.S.C.section 360bbb-3(b)(1), unless the authorization is terminated  or revoked sooner.       Influenza A by PCR NEGATIVE NEGATIVE Final   Influenza B by PCR NEGATIVE NEGATIVE Final    Comment: (NOTE) The Xpert Xpress SARS-CoV-2/FLU/RSV plus assay is intended as an aid in the diagnosis of influenza from Nasopharyngeal swab specimens and should not be used as a sole basis for treatment. Nasal washings and aspirates are unacceptable for Xpert Xpress SARS-CoV-2/FLU/RSV testing.  Fact Sheet for Patients: BloggerCourse.com  Fact Sheet for Healthcare Providers: SeriousBroker.it  This test is not yet approved or cleared by the [place] FDA and has been authorized for detection and/or diagnosis of SARS-CoV-2 by FDA under an Emergency Use Authorization (EUA). This EUA will remain in effect (meaning this test can be used) for the duration of the COVID-19 declaration under Section 564(b)(1) of the Act, 21 U.S.C. section 360bbb-3(b)(1), unless the authorization is terminated or revoked.     Resp Syncytial Virus by PCR NEGATIVE NEGATIVE Final    Comment: (NOTE) Fact Sheet for Patients: BloggerCourse.com  Fact Sheet for Healthcare Providers: SeriousBroker.it  This test is not yet approved or cleared by the [place] FDA and has been authorized for detection and/or diagnosis of SARS-CoV-2 by FDA under an Emergency Use Authorization (EUA). This EUA will remain in effect (meaning this test can be used) for the duration of the COVID-19 declaration under Section 564(b)(1) of the Act, 21 U.S.C. section 360bbb-3(b)(1),  unless the authorization is terminated or revoked.  Performed at [facility], [address], [place], [place] [address]      Radiology Studies: DG Abd 1 View Result Date: 01/20/2023 CLINICAL DATA:  [id number] Nausea AND vomiting [id number] EXAM: ABDOMEN - 1 VIEW COMPARISON:  None Available. FINDINGS: The bowel gas pattern is normal. No radio-opaque calculi or other significant radiographic abnormality are seen. IMPRESSION: Negative. Electronically Signed   By: Duanne Guess D.O.   On: 01/20/2023 15:38   CT HEAD WO CONTRAST ( ) Result Date: 01/20/2023 CLINICAL DATA:  62 year old male with altered mental status. EXAM: CT HEAD WITHOUT CONTRAST TECHNIQUE: Contiguous axial images were obtained from the base of the skull through the vertex without intravenous contrast. RADIATION DOSE REDUCTION: This exam was performed according to the departmental dose-optimization program which includes automated exposure control, adjustment of the mA and/or kV according to patient size and/or use of iterative reconstruction technique. COMPARISON:  Head CT 08/27/2014. FINDINGS: Brain: Chronic left hemisphere encephalomalacia with ex vacuo enlargement of the left lateral ventricle has not significantly changed since 2016. Mild associated dystrophic calcification. Associated midbrain Wallerian degeneration. Subtle associated right cerebellar Wallerian degeneration. No superimposed midline shift, ventriculomegaly, mass effect, evidence of mass lesion, intracranial hemorrhage or evidence of cortically based acute infarction. Patchy mild right hemisphere white matter scattered hypodensity. Vascular: Calcified atherosclerosis at the skull base. No suspicious intracranial vascular hyperdensity. Skull: Chronic left frontotemporal craniotomy appears stable. No acute osseous abnormality identified. Sinuses/Orbits: Visualized paranasal sinuses and mastoids are stable and well aerated. Other: Chronic postoperative changes to  the scalp. Visualized orbit soft tissues are within normal limits. IMPRESSION: 1. No acute intracranial abnormality. 2. Chronic left side craniotomy with fairly advanced underlying left hemisphere chronic encephalomalacia, Wallerian degeneration. 3. Mild superimposed white matter changes in  the right hemisphere are most commonly due to small vessel disease. Electronically Signed   By: Odessa Fleming M.D.   On: 01/20/2023 09:46   ECHOCARDIOGRAM COMPLETE Result Date: 01/19/2023    ECHOCARDIOGRAM REPORT   Patient Name:   Justin Molina. Date of Exam: 01/19/2023 Medical Rec #:  [id number]           Height:       71.0 in Accession #:    [id number]          Weight:       299.6 lb Date of Birth:  [birth year]          BSA:          2.504 m Patient Age:    62 years            BP:           141/89 mmHg Patient Gender: M                   HR:           101 bpm. Exam Location:  Inpatient Procedure: 2D Echo, Color Doppler and Cardiac Doppler Indications:    abnormal ecg  History:        Patient has prior history of Echocardiogram examinations, most                 recent 07/02/2013. Chronic kidney disease; Risk Factors:Sleep                 Apnea, Hypertension, Dyslipidemia and Diabetes.  Sonographer:    Delcie Roch RDCS Referring Phys: [phone] [facility] A [place]  Sonographer Comments: Image acquisition challenging due to patient body habitus. IMPRESSIONS  1. Left ventricular ejection fraction, by estimation, is 60 to 65%. The left ventricle has normal function. The left ventricle has no regional wall motion abnormalities. There is mild left ventricular hypertrophy. Left ventricular diastolic parameters were normal.  2. Right ventricular systolic function is normal. The right ventricular size is normal.  3. Left atrial size was mildly dilated.  4. The mitral valve is normal in structure. No evidence of mitral valve regurgitation. No evidence of mitral stenosis.  5. The aortic valve is normal in structure. Aortic valve  regurgitation is not visualized. No aortic stenosis is present.  6. The inferior vena cava is normal in size with greater than 50% respiratory variability, suggesting right atrial pressure of 3 mmHg. FINDINGS  Left Ventricle: Left ventricular ejection fraction, by estimation, is 60 to 65%. The left ventricle has normal function. The left ventricle has no regional wall motion abnormalities. The left ventricular internal cavity size was normal in size. There is  mild left ventricular hypertrophy. Left ventricular diastolic parameters were normal. Right Ventricle: The right ventricular size is normal. No increase in right ventricular wall thickness. Right ventricular systolic function is normal. Left Atrium: Left atrial size was mildly dilated. Right Atrium: Right atrial size was normal in size. Pericardium: Trivial pericardial effusion is present. The pericardial effusion is posterior to the left ventricle. Mitral Valve: The mitral valve is normal in structure. No evidence of mitral valve regurgitation. No evidence of mitral valve stenosis. Tricuspid Valve: The tricuspid valve is normal in structure. Tricuspid valve regurgitation is not demonstrated. No evidence of tricuspid stenosis. Aortic Valve: The aortic valve is normal in structure. Aortic valve regurgitation is not visualized. No aortic stenosis is present. Pulmonic Valve: The pulmonic valve was normal in structure. Pulmonic valve regurgitation is not visualized.  No evidence of pulmonic stenosis. Aorta: The aortic root is normal in size and structure. Venous: The inferior vena cava is normal in size with greater than 50% respiratory variability, suggesting right atrial pressure of 3 mmHg. IAS/Shunts: No atrial level shunt detected by color flow Doppler.  LEFT VENTRICLE PLAX 2D LVIDd:         5.40 cm LVIDs:         4.20 cm LV PW:         1.30 cm LV IVS:        1.20 cm LVOT diam:     2.00 cm LV SV:         46 LV SV Index:   19 LVOT Area:     3.14 cm  RIGHT  VENTRICLE          IVC RV Basal diam:  3.40 cm  IVC diam: 2.10 cm LEFT ATRIUM              Index        RIGHT ATRIUM           Index LA diam:        4.70 cm  1.88 cm/m   RA Area:     18.80 cm LA Vol (A2C):   101.0 ml 40.34 ml/m  RA Volume:   48.40 ml  19.33 ml/m LA Vol (A4C):   88.9 ml  35.51 ml/m LA Biplane Vol: 97.7 ml  39.02 ml/m  AORTIC VALVE LVOT Vmax:   94.00 cm/s LVOT Vmean:  63.600 cm/s LVOT VTI:    0.148 m  AORTA Ao Root diam: 3.60 cm Ao Asc diam:  4.00 cm  SHUNTS Systemic VTI:  0.15 m Systemic Diam: 2.00 cm Donato Schultz MD Electronically signed by Donato Schultz MD Signature Date/Time: 01/19/2023/4:11:44 PM    Final     Scheduled Meds:  amLODipine  5 mg Oral Daily   apixaban  5 mg Oral BID   ascorbic acid  500 mg Oral Daily   atorvastatin  80 mg Oral QHS   carvedilol  25 mg Oral BID WC   diclofenac Sodium  2 g Topical QID   doxazosin  8 mg Oral QHS   dronedarone  400 mg Oral BID   feeding supplement (GLUCERNA SHAKE)  237 mL Oral BID BM   furosemide  40 mg Oral Daily   hydrALAZINE  100 mg Oral BID   insulin aspart  0-15 Units Subcutaneous TID WC   insulin aspart  0-5 Units Subcutaneous QHS   isosorbide mononitrate  120 mg Oral Daily   multivitamin with minerals  1 tablet Oral Daily   polyethylene glycol  17 g Oral Daily   senna-docusate  1 tablet Oral BID   zinc sulfate (50mg  elemental zinc)  220 mg Oral Daily   Continuous Infusions:  ceFEPime (MAXIPIME) IV Stopped (01/21/23 0758)   linezolid (ZYVOX) IV 600 mg (01/21/23 0914)     LOS: 4 days   Hughie Closs, MD [facility]  01/21/2023, 9:48 AM   *Please note that this is a verbal dictation therefore any spelling or grammatical errors are due to the "Dragon Medical One" system interpretation.  Please page via Amion and do not message via secure chat for urgent patient care matters. Secure chat can be used for non urgent patient care matters.  How to contact the [facility] Attending or Consulting provider 7A - 7P or  covering provider during after hours 7P -7A, for this patient?  Check the  care team in [facility] and look for a) attending/consulting [facility] provider listed and b) the [facility] team listed. Page or secure chat 7A-7P. Log into [email or web address] and use [place]'s universal password to access. If you do not have the password, please contact the hospital operator. Locate the [facility] provider you are looking for under [facility] and page to a number that you can be directly reached. If you still have difficulty reaching the provider, please page the [facility] (Director on Call) for the Hospitalists listed on amion for assistance.

## 2023-01-21 NOTE — [place] FL2 (Signed)
[place] MEDICAID FL2 LEVEL OF CARE FORM     IDENTIFICATION  Patient Name: Justin Molina. Birthdate: 1960/10/29 Sex: male Admission Date (Current Location): 01/17/2023  [facility] and [place] Number:  Producer, television/film/video and Address:  [facility],  [address] [place] [address]      Provider Number: [id number]  Attending Physician Name and Address:  Hughie Closs, MD  Relative Name and Phone Number:  Ciro Avallone) tel#[phone]    Current Level of Care: Hospital Recommended Level of Care: Skilled Nursing Facility Prior Approval Number:    Date Approved/Denied:   PASRR Number: [id number]  Discharge Plan: SNF    Current Diagnoses: Patient Active Problem List   Diagnosis Date Noted   Sepsis (HCC) 01/17/2023   Cellulitis 01/17/2023   Unspecified dementia, unspecified severity, without behavioral disturbance, psychotic disturbance, mood disturbance, and anxiety (HCC) 05/01/2021   Secondary hyperparathyroidism of renal origin (HCC) 02/01/2021   Hemiplegia and hemiparesis following cerebral infarction affecting right dominant side (HCC) 01/04/2021   Aphasia following cerebral infarction 01/04/2021   Unspecified atrial fibrillation (HCC) 01/04/2021   Class 3 severe obesity due to excess calories with body mass index (BMI) of 45.0 to 49.9 in adult (HCC) 10/03/2017   OSA (obstructive sleep apnea) 01/16/2016   CKD (chronic kidney disease) stage 3, GFR 30-59 ml/min (HCC) 08/24/2014   HLD (hyperlipidemia) 08/24/2014   Anxiety 08/24/2014   DM (diabetes mellitus), type 2 with renal complications (HCC) 08/12/2014   Diabetic neuropathy (HCC) 08/12/2014   HTN (hypertension) 08/12/2014   History of CVA with residual deficit 08/12/2014    Orientation RESPIRATION BLADDER Height & Weight     Self  Normal Continent Weight: 135.9 kg Height:  5\' 11"  (180.3 cm)  BEHAVIORAL SYMPTOMS/MOOD NEUROLOGICAL BOWEL NUTRITION STATUS      Continent Diet (CHO  mod)  AMBULATORY STATUS COMMUNICATION OF NEEDS Skin   Limited Assist Verbally Other (Comment) (R leg wound care-see d/c summary)                       Personal Care Assistance Level of Assistance  Bathing, Feeding, Dressing Bathing Assistance: Limited assistance Feeding assistance: Limited assistance Dressing Assistance: Limited assistance     Functional Limitations Info  Sight, Hearing, Speech Sight Info: Impaired (readers) Hearing Info: Adequate Speech Info: Impaired (expressive asphasia)    SPECIAL CARE FACTORS FREQUENCY  PT (By licensed PT), OT (By licensed OT)     PT Frequency: 5x week OT Frequency: 5x week            Contractures Contractures Info: Not present    Additional Factors Info  Code Status, Allergies Code Status Info: Full Allergies Info: Contrast Media (Iodinated Contrast Media)           Current Medications (01/21/2023):  This is the current hospital active medication list Current Facility-Administered Medications  Medication Dose Route Frequency Provider Last Rate Last Admin   acetaminophen (TYLENOL) tablet 500 mg  500 mg Oral Q6H PRN Buena Irish, MD       Or   acetaminophen (TYLENOL) suppository 650 mg  650 mg Rectal Q6H PRN Buena Irish, MD       albuterol (PROVENTIL) (2.5 MG/3ML) 0.083% nebulizer solution 2.5 mg  2.5 mg Nebulization Q6H PRN Regalado, Belkys A, MD       amLODipine (NORVASC) tablet 5 mg  5 mg Oral Daily Regalado, Belkys A, MD   5 mg at 01/21/23 0910   apixaban (ELIQUIS) tablet  5 mg  5 mg Oral BID Regalado, Belkys A, MD   5 mg at 01/21/23 0909   artificial tears (LACRILUBE) ophthalmic ointment   Both Eyes Q4H PRN Regalado, Belkys A, MD   Given at 01/20/23 1734   ascorbic acid (VITAMIN C) tablet 500 mg  500 mg Oral Daily Buena Irish, MD   500 mg at 01/21/23 0910   atorvastatin (LIPITOR) tablet 80 mg  80 mg Oral QHS Regalado, Belkys A, MD       carvedilol (COREG) tablet 25 mg  25 mg Oral BID WC Regalado,  Belkys A, MD   25 mg at 01/21/23 0909   ceFEPIme (MAXIPIME) 2 g in sodium chloride 0.9 % 100 mL IVPB  2 g Intravenous Q8H Ellington, Abby K, RPH 200 mL/hr at 01/21/23 1306 2 g at 01/21/23 1306   diclofenac Sodium (VOLTAREN) 1 % topical gel 2 g  2 g Topical QID Hughie Closs, MD   2 g at 01/21/23 1308   doxazosin (CARDURA) tablet 8 mg  8 mg Oral QHS Regalado, Belkys A, MD       dronedarone (MULTAQ) tablet 400 mg  400 mg Oral BID Regalado, Belkys A, MD   400 mg at 01/21/23 0908   feeding supplement (GLUCERNA SHAKE) (GLUCERNA SHAKE) liquid 237 mL  237 mL Oral BID BM Regalado, Belkys A, MD   237 mL at 01/21/23 0905   furosemide (LASIX) tablet 40 mg  40 mg Oral Daily Regalado, Belkys A, MD   40 mg at 01/21/23 0909   hydrALAZINE (APRESOLINE) tablet 100 mg  100 mg Oral BID Regalado, Belkys A, MD   100 mg at 01/21/23 0909   insulin aspart (novoLOG) injection 0-15 Units  0-15 Units Subcutaneous TID WC Regalado, Belkys A, MD   2 Units at 01/20/23 1245   insulin aspart (novoLOG) injection 0-5 Units  0-5 Units Subcutaneous QHS Regalado, Belkys A, MD       isosorbide mononitrate (IMDUR) 24 hr tablet 120 mg  120 mg Oral Daily Regalado, Belkys A, MD   120 mg at 01/21/23 0908   linezolid (ZYVOX) IVPB 600 mg  600 mg Intravenous Q12H Regalado, Belkys A, MD   Stopped at 01/21/23 1057   multivitamin with minerals tablet 1 tablet  1 tablet Oral Daily Regalado, Belkys A, MD   1 tablet at 01/21/23 6213   nutrition supplement (JUVEN) (JUVEN) powder packet 1 packet  1 packet Oral BID BM Pahwani, Ravi, MD       ondansetron (ZOFRAN) tablet 4 mg  4 mg Oral Q6H PRN Buena Irish, MD       Or   ondansetron (ZOFRAN) injection 4 mg  4 mg Intravenous Q6H PRN Buena Irish, MD   4 mg at 01/20/23 0865   Oral care mouth rinse  15 mL Mouth Rinse PRN Buena Irish, MD       polyethylene glycol (MIRALAX / GLYCOLAX) packet 17 g  17 g Oral Daily Regalado, Belkys A, MD   17 g at 01/21/23 0905   senna-docusate (Senokot-S)  tablet 1 tablet  1 tablet Oral BID Regalado, Belkys A, MD   1 tablet at 01/21/23 0909   sorbitol 70 % solution 30 mL  30 mL Oral Daily PRN Buena Irish, MD       zinc sulfate (50mg  elemental zinc) capsule 220 mg  220 mg Oral Daily Buena Irish, MD   220 mg at 01/21/23 7846     Discharge Medications: Please see discharge summary for a  list of discharge medications.  Relevant Imaging Results:  Relevant Lab Results:   Additional Information [phone] [id number]  Correna Meacham, Olegario Messier, [place]

## 2023-01-21 NOTE — Progress Notes (Signed)
Dr Jacqulyn Bath notified patient had converted to NSR and EKG obtained. Ebon Ketchum, Yancey Flemings, RN

## 2023-01-21 NOTE — TOC Progression Note (Signed)
Transition of Care (TOC) - Progression Note    Patient Details  Name: Justin Molina. MRN: [id number] Date of Birth: 1960/05/15  Transition of Care [facility]) CM/SW Contact  Lafern Brinkley, Olegario Messier, RN Phone Number: 01/21/2023, 3:14 PM  Clinical Narrative:   Faxed out for ST SNF per Tonya(spouse) agreement.await bed offers.     Expected Discharge Plan: Skilled Nursing Facility Barriers to Discharge: Continued Medical Work up  Expected Discharge Plan and Services                                               Social Determinants of Health (SDOH) Interventions SDOH Screenings   Food Insecurity: No Food Insecurity (01/17/2023)  Housing: Low Risk  (01/18/2023)  Transportation Needs: No Transportation Needs (01/17/2023)  Utilities: Not At Risk (01/17/2023)  Tobacco Use: Low Risk  (01/17/2023)    Readmission Risk Interventions     No data to display

## 2023-01-21 NOTE — Progress Notes (Signed)
Inpatient Rehab Admissions Coordinator:   Per therapy recommendations, patient was screened for CIR candidacy by Gregory Barrick, MS, CCC-SLP. At this time, Pt. does not appear to demonstrate medical necessity to justify in hospital rehabilitation/CIR. I  will not pursue a rehab consult for this Pt.   Recommend other rehab venues to be pursued.  Please contact me with any questions.  Reynard Christoffersen, MS, CCC-SLP Rehab Admissions Coordinator  [phone] (celll) [phone] (office)  

## 2023-01-21 NOTE — Consult Note (Addendum)
WOC Nurse Consult Note: patient with longstanding wound to R lower leg (seen at wound care center in [place] 07/2021); significant other in room says this has been there for less than a month.  SO also says primary MD here in [place] has been managing wound as well as [facility] home health  Reason for Consult: wife wants wound care to assess wound (had already consulted and placed orders 01/18/2023)  Wound type: full thickness likely r/t venous insufficiency  Pressure Injury POA: NA  Measurement: 7 cm x 10 cm x 0.2 cm  Wound bed:  80% yellow, appears to be subcutaneous tissue 20% pink moist; no necrotic tissue noted, no foul odor  Drainage (amount, consistency, odor) moderate tan, no odor  Periwound: edema  Dressing procedure/placement/frequency: Clean R lateral lower leg with Vashe wound cleanser Hart [place] [phone]), apply a piece of silver hydrofiber Hart Rochester [phone]) cut to fit wound bed daily, cover with ABD pad and wrap with Kerlix beginning right above toes and ending right below knee.  Secure with Ace bandage in same fashion as Kerlix for light compression.   Significant other states she has tried to get primary MD to send patient to wound care center, states he had ulcers on the left leg that took "5 years" to heal.  I also encouraged a referral to wound care center for this chronic leg ulcer. Also wound care center or vascular  could assist patient with managing his venous insufficiency with compression wraps if ABIs normal which would help heal wounds and prevent recurrence. We also discussed elevation and not sitting with legs dependent.  SO states patient is now going for rehab which means they will have a wound care nurse there to follow and can also send patient out to wound care center as well.  Discussed plan of care with bedside nurse.  Will send secure chat to primary MD regarding above as well.   WOC team will not follow. Re-consult if further needs arise.   Thank you,     Priscella Mann MSN, RN-BC, [employer] [phone]

## 2023-01-22 DIAGNOSIS — R6520 Severe sepsis without septic shock: Secondary | ICD-10-CM | POA: Diagnosis not present

## 2023-01-22 DIAGNOSIS — A419 Sepsis, unspecified organism: Secondary | ICD-10-CM | POA: Diagnosis not present

## 2023-01-22 DIAGNOSIS — N179 Acute kidney failure, unspecified: Secondary | ICD-10-CM | POA: Diagnosis not present

## 2023-01-22 LAB — CULTURE, BLOOD (ROUTINE X 2): Culture: NO GROWTH

## 2023-01-22 LAB — GLUCOSE, CAPILLARY
Glucose-Capillary: 93 mg/dL (ref 70–99)
Glucose-Capillary: 97 mg/dL (ref 70–99)
Glucose-Capillary: 109 mg/dL — ABNORMAL HIGH (ref 70–99)
Glucose-Capillary: 108 mg/dL — ABNORMAL HIGH (ref 70–99)

## 2023-01-22 MED ORDER — GLUCERNA SHAKE PO LIQD
237.0000 mL | Freq: Two times a day (BID) | ORAL | Status: DC
  Administered 2023-01-22 – 2023-01-25 (×4): 237 mL via ORAL
  Filled 2023-01-22 (×7): qty 237

## 2023-01-22 MED ORDER — ENSURE MAX PROTEIN PO LIQD
11.0000 [oz_av] | Freq: Every day | ORAL | Status: DC
Start: 2023-01-22 — End: 2023-01-25
  Administered 2023-01-22 – 2023-01-25 (×2): 11 [oz_av] via ORAL
  Filled 2023-01-22 (×4): qty 330

## 2023-01-22 MED ORDER — AMLODIPINE BESYLATE 5 MG PO TABS
10.0000 mg | ORAL_TABLET | Freq: Every day | ORAL | Status: DC
  Administered 2023-01-22 – 2023-01-25 (×4): 10 mg via ORAL
  Filled 2023-01-22 (×4): qty 2

## 2023-01-22 NOTE — TOC Progression Note (Signed)
Transition of Care (TOC) - Progression Note    Patient Details  Name: Justin Molina. MRN: [id number] Date of Birth: 1960-04-03  Transition of Care [facility]) CM/SW Contact  Elizabelle Fite, Olegario Messier, RN Phone Number: 01/22/2023, 11:38 AM  Clinical Narrative:  Bed offers given to Tonya(spouse) await choice prior auth.     Expected Discharge Plan: Skilled Nursing Facility Barriers to Discharge: Continued Medical Work up  Expected Discharge Plan and Services                                               Social Determinants of Health (SDOH) Interventions SDOH Screenings   Food Insecurity: No Food Insecurity (01/17/2023)  Housing: Low Risk  (01/18/2023)  Transportation Needs: No Transportation Needs (01/17/2023)  Utilities: Not At Risk (01/17/2023)  Tobacco Use: Low Risk  (01/17/2023)    Readmission Risk Interventions     No data to display

## 2023-01-22 NOTE — Plan of Care (Signed)
  Problem: Health Behavior/Discharge Planning: Goal: Ability to manage health-related needs will improve Outcome: Progressing   Problem: Clinical Measurements: Goal: Ability to maintain clinical measurements within normal limits will improve Outcome: Progressing Goal: Will remain free from infection Outcome: Progressing Goal: Diagnostic test results will improve Outcome: Progressing Goal: Respiratory complications will improve Outcome: Progressing Goal: Cardiovascular complication will be avoided Outcome: Progressing   Problem: Activity: Goal: Risk for activity intolerance will decrease Outcome: Progressing   Problem: Nutrition: Goal: Adequate nutrition will be maintained Outcome: Progressing   Problem: Coping: Goal: Level of anxiety will decrease Outcome: Progressing   Problem: Elimination: Goal: Will not experience complications related to bowel motility Outcome: Progressing Goal: Will not experience complications related to urinary retention Outcome: Progressing   Problem: Pain Management: Goal: General experience of comfort will improve Outcome: Progressing   Problem: Safety: Goal: Ability to remain free from injury will improve Outcome: Progressing   Problem: Skin Integrity: Goal: Risk for impaired skin integrity will decrease Outcome: Progressing   Problem: Education: Goal: Ability to describe self-care measures that may prevent or decrease complications (Diabetes Survival Skills Education) will improve Outcome: Progressing Goal: Individualized Educational Video(s) Outcome: Progressing   Problem: Coping: Goal: Ability to adjust to condition or change in health will improve Outcome: Progressing   Problem: Fluid Volume: Goal: Ability to maintain a balanced intake and output will improve Outcome: Progressing   Problem: Health Behavior/Discharge Planning: Goal: Ability to identify and utilize available resources and services will improve Outcome:  Progressing Goal: Ability to manage health-related needs will improve Outcome: Progressing   Problem: Metabolic: Goal: Ability to maintain appropriate glucose levels will improve Outcome: Progressing   Problem: Nutritional: Goal: Maintenance of adequate nutrition will improve Outcome: Progressing Goal: Progress toward achieving an optimal weight will improve Outcome: Progressing   Problem: Skin Integrity: Goal: Risk for impaired skin integrity will decrease Outcome: Progressing   Problem: Tissue Perfusion: Goal: Adequacy of tissue perfusion will improve Outcome: Progressing   Problem: Fluid Volume: Goal: Hemodynamic stability will improve Outcome: Progressing   Problem: Clinical Measurements: Goal: Diagnostic test results will improve Outcome: Progressing Goal: Signs and symptoms of infection will decrease Outcome: Progressing   Problem: Respiratory: Goal: Ability to maintain adequate ventilation will improve Outcome: Progressing

## 2023-01-22 NOTE — Progress Notes (Signed)
Initial Nutrition Assessment  DOCUMENTATION CODES:   Morbid obesity  INTERVENTION:  - Carb Modified diet. - Glucerna Shake po BID, each supplement provides 220 kcal and 10 grams of protein - Ensure Max po once daily, provides 150 kcal and 30 grams of protein.  - 1 packet Juven BID, each packet provides 95 calories, 2.5 grams of protein (collagen), and 9.8 grams of carbohydrate (3 grams sugar); also contains 7 grams of L-arginine and L-glutamine, 300 mg vitamin C, 15 mg vitamin E, 1.2 mcg vitamin B-12, 9.5 mg zinc, 200 mg calcium, and 1.5 g  Calcium Beta-hydroxy-Beta-methylbutyrate to support wound healing - Wound healing diet education with handout provided.  - Continue Multivitamin with minerals daily, 500mg  vitamin C, 200mg  zinc x14 days.    NUTRITION DIAGNOSIS:   Increased nutrient needs related to wound healing as evidenced by estimated needs  GOAL:   Patient will meet greater than or equal to 90% of their needs  MONITOR:   PO intake, Supplement acceptance, Labs, Weight trends, Skin  REASON FOR ASSESSMENT:   Consult Assessment of nutrition requirement/status  ASSESSMENT:   62 year old with past medical history significant for stroke with residual right hemiparesis and aphasia, diabetes type 2, CHF, obstructive sleep apnea who presented with an open large wound on the right lower extremity.  Patient in bed at time of visit, wife at bedside.  Wife reports patient's UBW is 280# but over the past year weight has been up due to fluid.   She reports he eats fairly well at home. Often has oatmeal or cereal (bran flakes) for breakfast, leftovers from the night before or a [place] sandwich for lunch, and often fish or chicken with rice or beans for dinner. Likes to eats salad and snacks on fruit between meals.  She notes that he started on ozempic ~1 month ago and she has already noticed his appetite is much lower than it had been. Eating smaller portions at meals.   She reports  he has been eating well this admission. Documented to be consuming 50-100% of meals with average of 94%. He likes the Glucerna and has been consuming. Wife wanting something with more protein so discussed adding Ensure Max and wife agreement.  Juven also added yesterday.  Provided wound healing diet education with handout to wife. Discussed importance of protein rich food sources at all meals in addition to fruits and vegetables. Wife to encourage intake of protein and supplements. Also reviewed small and frequent meals/snacks to combat low appetite with Ozempic.    Medications reviewed and include: 500mg  vitamin C, MVI, 220mg  zinc  Labs reviewed:  No BMP since 12/15   NUTRITION - FOCUSED PHYSICAL EXAM:  Patient working with RN's  Diet Order:   Diet Order             Diet Carb Modified Fluid consistency: Thin; Room service appropriate? Yes  Diet effective now                   EDUCATION NEEDS:  Education needs have been addressed  Skin:  Skin Assessment: Reviewed RN Assessment  Last BM:  12/16  Height:  Ht Readings from Last 1 Encounters:  01/17/23 5\' 11"  (1.803 m)   Weight:  Wt Readings from Last 1 Encounters:  01/18/23 135.9 kg   Ideal Body Weight:  78.18 kg  BMI:  Body mass index is 41.79 kg/m.  Estimated Nutritional Needs:  Kcal:  4098-1191 kcals Protein:  115-130 grams Fluid:  >/= 2.1L  Shelle Iron RD, LDN Contact via [employer].

## 2023-01-23 DIAGNOSIS — N179 Acute kidney failure, unspecified: Secondary | ICD-10-CM | POA: Diagnosis not present

## 2023-01-23 DIAGNOSIS — R6520 Severe sepsis without septic shock: Secondary | ICD-10-CM | POA: Diagnosis not present

## 2023-01-23 DIAGNOSIS — A419 Sepsis, unspecified organism: Secondary | ICD-10-CM | POA: Diagnosis not present

## 2023-01-23 LAB — CBC
HCT: 35.3 % — ABNORMAL LOW (ref 39.0–52.0)
Hemoglobin: 11.3 g/dL — ABNORMAL LOW (ref 13.0–17.0)
MCH: 27.3 pg (ref 26.0–34.0)
MCHC: 32 g/dL (ref 30.0–36.0)
MCV: 85.3 fL (ref 80.0–100.0)
Platelets: 308 10*3/uL (ref 150–400)
RBC: 4.14 MIL/uL — ABNORMAL LOW (ref 4.22–5.81)
RDW: 14.3 % (ref 11.5–15.5)
WBC: 7.7 10*3/uL (ref 4.0–10.5)
nRBC: 0 % (ref 0.0–0.2)

## 2023-01-23 LAB — GLUCOSE, CAPILLARY
Glucose-Capillary: 84 mg/dL (ref 70–99)
Glucose-Capillary: 98 mg/dL (ref 70–99)
Glucose-Capillary: 126 mg/dL — ABNORMAL HIGH (ref 70–99)
Glucose-Capillary: 95 mg/dL (ref 70–99)

## 2023-01-23 LAB — BASIC METABOLIC PANEL
Anion gap: 9 (ref 5–15)
BUN: 21 mg/dL (ref 8–23)
CO2: 25 mmol/L (ref 22–32)
Calcium: 7.7 mg/dL — ABNORMAL LOW (ref 8.9–10.3)
Chloride: 105 mmol/L (ref 98–111)
Creatinine, Ser: 1.8 mg/dL — ABNORMAL HIGH (ref 0.61–1.24)
GFR, Estimated: 42 mL/min — ABNORMAL LOW (ref >60)
Glucose, Bld: 89 mg/dL (ref 70–99)
Potassium: 3.4 mmol/L — ABNORMAL LOW (ref 3.5–5.1)
Sodium: 139 mmol/L (ref 135–145)

## 2023-01-23 LAB — MAGNESIUM: Magnesium: 2.3 mg/dL (ref 1.7–2.4)

## 2023-01-23 MED ORDER — LINEZOLID 600 MG PO TABS
600.0000 mg | ORAL_TABLET | Freq: Two times a day (BID) | ORAL | Status: DC
  Administered 2023-01-23 – 2023-01-25 (×4): 600 mg via ORAL
  Filled 2023-01-23 (×5): qty 1

## 2023-01-23 MED ORDER — HYDRALAZINE HCL 50 MG PO TABS
100.0000 mg | ORAL_TABLET | Freq: Three times a day (TID) | ORAL | Status: DC
  Administered 2023-01-23 – 2023-01-24 (×6): 100 mg via ORAL
  Filled 2023-01-23 (×6): qty 2

## 2023-01-23 MED ORDER — POTASSIUM CHLORIDE CRYS ER 20 MEQ PO TBCR
40.0000 meq | EXTENDED_RELEASE_TABLET | Freq: Once | ORAL | Status: AC
Start: 2023-01-23 — End: 2023-01-23
  Administered 2023-01-23: 40 meq via ORAL
  Filled 2023-01-23: qty 2

## 2023-01-23 NOTE — Progress Notes (Signed)
PROGRESS NOTE    Justin Molina.  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  Per wife wound was the size of a nickel about a month ago and has rapidly grown.  Patient has been having a lot of swelling.  He has had multiple changes to his medication recently.  She developed fever and vomiting.  Evaluation in the ED consistent with sepsis.    Assessment & Plan:   Principal Problem:   Sepsis (HCC) Active Problems:   DM (diabetes mellitus), type 2 with renal complications (HCC)   History of CVA with residual deficit   CKD (chronic kidney disease) stage 3, GFR 30-59 ml/min (HCC)   OSA (obstructive sleep apnea)   Cellulitis  Sepsis secondary to Right lower extremity wound with cellulitis I removed his dressing, he had very small pus, no Signa secondary, wife was at the bedside and saw the wound and verified that the cellulitis and wound has significantly improved since admission.   2-Diabetes type 2 Hold Januvia and Ozempic.  SSI.  Blood sugar controlled.    3-Nausea, vomiting.  Symptoms resolved.  KUB negative.   History of A-fib: Continue with Eliquis Resume carvedilol.  Resume Moltaq 12/14   4-History of CVA with residual deficits: Patient has dysarthria at baseline right hemiparesis.  Walk with a cane   CKD stage IIIb: Previous Cr range 1.9--1.6 Monitor on lasix.  Stable.  Continue Lasix.   Hypokalemia; resolved.   CAD; continue with imdur.  Resume lasix.  ECHO: Normal EF   HTN; blood pressure labile, continue with norvasc, hydralazine, lasix, carvedilol.    Acute metabolic Encephalopathy; -CT head unremarkable.  Appears to be resolved and at baseline.   Dry eye; continue  artificial tear.  Left knee pain: Resolved with Voltaren gel.  DVT prophylaxis: Eliquis   Code Status: Full Code  Family Communication:  None present at bedside.  Plan of care discussed with patient in length and he/she verbalized understanding and agreed with it.  Status is: Inpatient Remains inpatient appropriate because: Patient is now medically stable, pending placement.   Estimated body mass index is 41.79 kg/m as calculated from the following:   Height as of this encounter: 5\' 11"  (1.803 m).   Weight as of this encounter: 135.9 kg.    Nutritional Assessment: Body mass index is 41.79 kg/m.Marland Kitchen Seen by dietician.  I agree with the assessment and plan as outlined below: Nutrition Status: Nutrition Problem: Increased nutrient needs Etiology: wound healing Signs/Symptoms: estimated needs Interventions: Refer to RD note for recommendations, Glucerna shake, MVI, Premier Protein, Juven  . Skin Assessment: I have examined the patient's skin and I agree with the wound assessment as performed by the wound care RN as outlined below:    Consultants:  None  Procedures:  None  Antimicrobials:  Anti-infectives (From admission, onward)    Start     Dose/Rate Route Frequency Ordered Stop   01/19/23 1400  ceFEPIme (MAXIPIME) 2 g in sodium chloride 0.9 % 100 mL IVPB        2 g 200 mL/hr over 30 Minutes Intravenous Every 8 hours 01/19/23 0837     01/19/23 1000  linezolid (ZYVOX) IVPB 600 mg        600 mg 300 mL/hr over  60 Minutes Intravenous Every 12 hours 01/19/23 0804     01/19/23 0500  vancomycin (VANCOREADY) IVPB 1750 mg/350 mL  Status:  Discontinued        1,750 mg 175 mL/hr over 120 Minutes Intravenous Every 36 hours 01/18/23 0112 01/18/23 0936   01/18/23 1800  vancomycin (VANCOREADY) IVPB 1250 mg/250 mL  Status:  Discontinued        1,250 mg 166.7 mL/hr over 90 Minutes Intravenous Every 24 hours 01/18/23 0936 01/19/23 0804   01/18/23 0500  ceFEPIme (MAXIPIME) 2 g in sodium  chloride 0.9 % 100 mL IVPB  Status:  Discontinued        2 g 200 mL/hr over 30 Minutes Intravenous Every 12 hours 01/18/23 0105 01/19/23 0837   01/17/23 1800  vancomycin (VANCOCIN) IVPB 1000 mg/200 mL premix       Placed in "Followed by" Linked Group   1,000 mg 200 mL/hr over 60 Minutes Intravenous  Once 01/17/23 1649 01/17/23 1910   01/17/23 1700  vancomycin (VANCOCIN) IVPB 1000 mg/200 mL premix       Placed in "Followed by" Linked Group   1,000 mg 200 mL/hr over 60 Minutes Intravenous  Once 01/17/23 1649 01/17/23 1810   01/17/23 1630  ceFEPIme (MAXIPIME) 2 g in sodium chloride 0.9 % 100 mL IVPB        2 g 200 mL/hr over 30 Minutes Intravenous  Once 01/17/23 1617 01/17/23 1700         Subjective: Patient seen and examined.  He has no complaint today.  He is happy that his knee pain is gone.  Wife at the bedside.  She has no concerns either.  Objective: Vitals:   01/22/23 0957 01/22/23 1228 01/22/23 2009 01/23/23 0400  BP: (!) 155/88 128/89 (!) 135/98 (!) 139/96  Pulse:  67 88 (!) 107  Resp:  20 20 16   Temp:  98.9 F (37.2 C) 98.3 F (36.8 C) 98.3 F (36.8 C)  TempSrc:  Oral Oral Oral  SpO2:  93% 94% 94%  Weight:      Height:        Intake/Output Summary (Last 24 hours) at 01/23/2023 0752 Last data filed at 01/22/2023 2040 Gross per 24 hour  Intake 360 ml  Output 2050 ml  Net -1690 ml   Filed Weights   01/17/23 1515 01/17/23 2120 01/18/23 0519  Weight: 132 kg 134.7 kg 135.9 kg    Examination:  General exam: Appears calm and comfortable  Respiratory system: Clear to auscultation. Respiratory effort normal. Cardiovascular system: S1 & S2 heard, RRR. No JVD, murmurs, rubs, gallops or clicks. No pedal edema. Gastrointestinal system: Abdomen is nondistended, soft and nontender. No organomegaly or masses felt. Normal bowel sounds heard. Central nervous system: Alert and oriented.  Right hemiparesis from previous stroke and dysarthria. Skin: No rashes, lesions or  ulcers  Data Reviewed: I have personally reviewed following labs and imaging studies  CBC: Recent Labs  Lab 01/17/23 1527 01/18/23 0513 01/19/23 0509 01/20/23 0911 01/23/23 0523  WBC 10.6* 9.3 9.0 8.1 7.7  NEUTROABS 8.4*  --   --   --   --   HGB 12.1* 10.9* 11.5* 11.6* 11.3*  HCT 36.5* 34.9* 34.7* 34.7* 35.3*  MCV 81.8 85.3 83.6 83.4 85.3  PLT 292 292 286 292 308   Basic Metabolic Panel: Recent Labs  Lab 01/17/23 1527 01/18/23 0513 01/19/23 0509 01/20/23 0911 01/23/23 0523  NA 139 139 137 138 139  K 3.3* 3.2* 3.1* 3.6 3.4*  CL 104 107 105 107 105  CO2 25 24 25 25 25   GLUCOSE 101* 86 93 151* 89  BUN 22 18 16 18 21   CREATININE 2.17* 1.88* 1.66* 1.53* 1.80*  CALCIUM 7.9* 7.5* 7.3* 7.7* 7.7*  MG  --   --   --   --  2.3   GFR: Estimated Creatinine Clearance: 59.9 mL/min (A) (by C-G formula based on SCr of 1.8 mg/dL (H)). Liver Function Tests: Recent Labs  Lab 01/17/23 1527  AST 24  ALT 14  ALKPHOS 60  BILITOT 0.8  PROT 8.6*  ALBUMIN 3.1*   No results for input(s): "LIPASE", "AMYLASE" in the last 168 hours. No results for input(s): "AMMONIA" in the last 168 hours. Coagulation Profile: No results for input(s): "INR", "PROTIME" in the last 168 hours. Cardiac Enzymes: No results for input(s): "CKTOTAL", "CKMB", "CKMBINDEX", "TROPONINI" in the last 168 hours. BNP (last 3 results) No results for input(s): "PROBNP" in the last 8760 hours. HbA1C: No results for input(s): "HGBA1C" in the last 72 hours. CBG: Recent Labs  Lab 01/21/23 2016 01/22/23 0716 01/22/23 1128 01/22/23 1608 01/22/23 2010  GLUCAP 99 93 97 109* 108*   Lipid Profile: No results for input(s): "CHOL", "HDL", "LDLCALC", "TRIG", "CHOLHDL", "LDLDIRECT" in the last 72 hours. Thyroid Function Tests: No results for input(s): "TSH", "T4TOTAL", "FREET4", "T3FREE", "THYROIDAB" in the last 72 hours. Anemia Panel: Recent Labs    01/20/23 0911  VITAMINB12 620   Sepsis Labs: Recent Labs  Lab  01/17/23 1529  LATICACIDVEN 1.2    Recent Results (from the past 240 hours)  Blood culture (routine x 2)     Status: None   Collection Time: 01/17/23  3:27 PM   Specimen: BLOOD  Result Value Ref Range Status   Specimen Description   Final    BLOOD LEFT ANTECUBITAL Performed at [facility], [address] [place], [place] [address]    Special Requests   Final    BOTTLES DRAWN AEROBIC AND ANAEROBIC Blood Culture results may not be optimal due to an inadequate volume of blood received in culture bottles Performed at [facility], [address] [place], [place] [address]    Culture   Final    NO GROWTH 5 DAYS Performed at [facility], [address] [place], [place] [address]    Report Status 01/22/2023 FINAL  Final  Resp panel by RT-PCR (RSV, Flu A&B, Covid) Anterior Nasal Swab     Status: None   Collection Time: 01/17/23  3:28 PM   Specimen: Anterior Nasal Swab  Result Value Ref Range Status   SARS Coronavirus 2 by RT PCR NEGATIVE NEGATIVE Final    Comment: (NOTE) SARS-CoV-2 target nucleic acids are NOT DETECTED.  The SARS-CoV-2 RNA is generally detectable in upper respiratory specimens during the acute phase of infection. The lowest concentration of SARS-CoV-2 viral copies this assay can detect is 138 copies/mL. A negative result does not preclude SARS-Cov-2 infection and should not be used as the sole basis for treatment or other patient management decisions. A negative result may occur with  improper specimen collection/handling, submission of specimen other than nasopharyngeal swab, presence of viral mutation(s) within the areas targeted by this assay, and inadequate number of viral copies(<138 copies/mL). A negative result must be combined with clinical observations, patient history, and epidemiological information. The expected result is Negative.  Fact Sheet for Patients:  BloggerCourse.com  Fact Sheet for  Healthcare Providers:  SeriousBroker.it  This test is no t yet approved or cleared by the [place] and  has been authorized for detection and/or diagnosis of SARS-CoV-2 by FDA under an Emergency Use Authorization (EUA). This EUA will remain  in effect (meaning this test can be used) for the duration of the COVID-19 declaration under Section 564(b)(1) of the Act, 21 U.S.C.section 360bbb-3(b)(1), unless the authorization is terminated  or revoked sooner.       Influenza A by PCR NEGATIVE NEGATIVE Final   Influenza B by PCR NEGATIVE NEGATIVE Final    Comment: (NOTE) The Xpert Xpress SARS-CoV-2/FLU/RSV plus assay is intended as an aid in the diagnosis of influenza from Nasopharyngeal swab specimens and should not be used as a sole basis for treatment. Nasal washings and aspirates are unacceptable for Xpert Xpress SARS-CoV-2/FLU/RSV testing.  Fact Sheet for Patients: BloggerCourse.com  Fact Sheet for Healthcare Providers: SeriousBroker.it  This test is not yet approved or cleared by the [place] FDA and has been authorized for detection and/or diagnosis of SARS-CoV-2 by FDA under an Emergency Use Authorization (EUA). This EUA will remain in effect (meaning this test can be used) for the duration of the COVID-19 declaration under Section 564(b)(1) of the Act, 21 U.S.C. section 360bbb-3(b)(1), unless the authorization is terminated or revoked.     Resp Syncytial Virus by PCR NEGATIVE NEGATIVE Final    Comment: (NOTE) Fact Sheet for Patients: BloggerCourse.com  Fact Sheet for Healthcare Providers: SeriousBroker.it  This test is not yet approved or cleared by the [place] FDA and has been authorized for detection and/or diagnosis of SARS-CoV-2 by FDA under an Emergency Use Authorization (EUA). This EUA will remain in effect (meaning this  test can be used) for the duration of the COVID-19 declaration under Section 564(b)(1) of the Act, 21 U.S.C. section 360bbb-3(b)(1), unless the authorization is terminated or revoked.  Performed at [facility], [address] [place], [place] [address]      Radiology Studies: No results found.   Scheduled Meds:  amLODipine  10 mg Oral Daily   apixaban  5 mg Oral BID   ascorbic acid  500 mg Oral Daily   atorvastatin  80 mg Oral QHS   carvedilol  25 mg Oral BID WC   diclofenac Sodium  2 g Topical QID   doxazosin  8 mg Oral QHS   dronedarone  400 mg Oral BID   feeding supplement (GLUCERNA SHAKE)  237 mL Oral BID BM   furosemide  40 mg Oral Daily   hydrALAZINE  100 mg Oral BID   insulin aspart  0-15 Units Subcutaneous TID WC   insulin aspart  0-5 Units Subcutaneous QHS   isosorbide mononitrate  120 mg Oral Daily   multivitamin with minerals  1 tablet Oral Daily   nutrition supplement (JUVEN)  1 packet Oral BID BM   polyethylene glycol  17 g Oral Daily   Ensure Max Protein  11 oz Oral Daily   senna-docusate  1 tablet Oral BID   zinc sulfate (50mg  elemental zinc)  220 mg Oral Daily   Continuous Infusions:  ceFEPime (MAXIPIME) IV 2 g (01/23/23 0535)   linezolid (ZYVOX) IV 600 mg (01/22/23 2206)     LOS: 6 days   Hughie Closs, MD [facility]  01/23/2023, 7:52 AM   *Please note that this is a verbal dictation therefore any spelling or grammatical errors are due to the "Dragon Medical One" system interpretation.  Please page via Amion and  do not message via secure chat for urgent patient care matters. Secure chat can be used for non urgent patient care matters.  How to contact the [facility] Attending or Consulting provider 7A - 7P or covering provider during after hours 7P -7A, for this patient?  Check the care team in [facility] and look for a) attending/consulting [facility] provider listed and b) the [facility] team listed. Page or secure chat 7A-7P. Log into [email or web address] and  use [facility]'s universal password to access. If you do not have the password, please contact the hospital operator. Locate the [facility] provider you are looking for under [facility] and page to a number that you can be directly reached. If you still have difficulty reaching the provider, please page the [facility] (Director on Call) for the Hospitalists listed on amion for assistance.

## 2023-01-23 NOTE — Plan of Care (Signed)
  Problem: Health Behavior/Discharge Planning: Goal: Ability to manage health-related needs will improve Outcome: Progressing   Problem: Clinical Measurements: Goal: Ability to maintain clinical measurements within normal limits will improve Outcome: Progressing Goal: Will remain free from infection Outcome: Progressing Goal: Diagnostic test results will improve Outcome: Progressing Goal: Respiratory complications will improve Outcome: Progressing Goal: Cardiovascular complication will be avoided Outcome: Progressing   Problem: Activity: Goal: Risk for activity intolerance will decrease Outcome: Progressing   Problem: Nutrition: Goal: Adequate nutrition will be maintained Outcome: Progressing   Problem: Coping: Goal: Level of anxiety will decrease Outcome: Progressing   Problem: Elimination: Goal: Will not experience complications related to bowel motility Outcome: Progressing Goal: Will not experience complications related to urinary retention Outcome: Progressing   Problem: Pain Management: Goal: General experience of comfort will improve Outcome: Progressing   Problem: Safety: Goal: Ability to remain free from injury will improve Outcome: Progressing   Problem: Skin Integrity: Goal: Risk for impaired skin integrity will decrease Outcome: Progressing   Problem: Education: Goal: Ability to describe self-care measures that may prevent or decrease complications (Diabetes Survival Skills Education) will improve Outcome: Progressing Goal: Individualized Educational Video(s) Outcome: Progressing   Problem: Coping: Goal: Ability to adjust to condition or change in health will improve Outcome: Progressing   Problem: Fluid Volume: Goal: Ability to maintain a balanced intake and output will improve Outcome: Progressing   Problem: Health Behavior/Discharge Planning: Goal: Ability to identify and utilize available resources and services will improve Outcome:  Progressing Goal: Ability to manage health-related needs will improve Outcome: Progressing   Problem: Metabolic: Goal: Ability to maintain appropriate glucose levels will improve Outcome: Progressing   Problem: Nutritional: Goal: Maintenance of adequate nutrition will improve Outcome: Progressing Goal: Progress toward achieving an optimal weight will improve Outcome: Progressing   Problem: Skin Integrity: Goal: Risk for impaired skin integrity will decrease Outcome: Progressing   Problem: Tissue Perfusion: Goal: Adequacy of tissue perfusion will improve Outcome: Progressing   Problem: Fluid Volume: Goal: Hemodynamic stability will improve Outcome: Progressing   Problem: Clinical Measurements: Goal: Diagnostic test results will improve Outcome: Progressing Goal: Signs and symptoms of infection will decrease Outcome: Progressing   Problem: Respiratory: Goal: Ability to maintain adequate ventilation will improve Outcome: Progressing

## 2023-01-23 NOTE — Progress Notes (Signed)
PROGRESS NOTE    Justin Molina.  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  Per wife wound was the size of a nickel about a month ago and has rapidly grown.  Patient has been having a lot of swelling.  He has had multiple changes to his medication recently.  he developed fever and vomiting.  Evaluation in the ED consistent with sepsis.  Currently stable and pending placement and waiting for insurance authorization.    Assessment & Plan:   Principal Problem:   Sepsis (HCC) Active Problems:   DM (diabetes mellitus), type 2 with renal complications (HCC)   History of CVA with residual deficit   CKD (chronic kidney disease) stage 3, GFR 30-59 ml/min (HCC)   OSA (obstructive sleep apnea)   Cellulitis  Sepsis secondary to Right lower extremity wound with cellulitis I removed his dressing, he had very small pus, no erythema, per wife who was at the bedside and saw the wound and verified that the cellulitis and wound has significantly improved since admission.  Patient has received 7 days of IV antibiotics, will transition to oral linezolid for 3 more days.   2-Diabetes type 2 Hold Januvia and Ozempic.  SSI.  Blood sugar controlled.  3-Nausea, vomiting.  Symptoms resolved.  KUB negative.   History of A-fib: Continue with Eliquis Resume carvedilol.  Resume Moltaq 12/14   4-History of CVA with residual deficits: Patient has dysarthria at baseline right hemiparesis.  Walk with a cane   CKD stage IIIb: Previous Cr range 1.9--1.6 Monitor on lasix.  Stable.  Continue Lasix.   Hypokalemia; low again, will replenish.   CAD; continue with imdur.  Resume lasix.  ECHO: Normal EF   HTN; blood  pressure labile but recently mostly elevated diastolic, continue with norvasc, increase hydralazine to 3 times daily hydralazine and continue lasix, carvedilol.    Acute metabolic Encephalopathy; -CT head unremarkable.  Appears to be resolved and at baseline.   Dry eye; continue artificial tear.  Left knee pain: Resolved with Voltaren gel.  DVT prophylaxis: Eliquis   Code Status: Full Code  Family Communication:  None present at bedside.  Plan of care discussed with patient in length and he/she verbalized understanding and agreed with it.  Status is: Inpatient Remains inpatient appropriate because: Patient is now medically stable, pending placement and awaiting insurance authorization.   Estimated body mass index is 41.79 kg/m as calculated from the following:   Height as of this encounter: 5\' 11"  (1.803 m).   Weight as of this encounter: 135.9 kg.    Nutritional Assessment: Body mass index is 41.79 kg/m.Marland Kitchen Seen by dietician.  I agree with the assessment and plan as outlined below: Nutrition Status: Nutrition Problem: Increased nutrient needs Etiology: wound healing Signs/Symptoms: estimated needs Interventions: Refer to RD note for recommendations, Glucerna shake, MVI, Premier Protein, Juven  . Skin Assessment: I have examined the patient's skin and I agree with the wound assessment as performed by the wound care RN as outlined below:    Consultants:  None  Procedures:  None  Antimicrobials:  Anti-infectives (From admission, onward)    Start     Dose/Rate Route Frequency Ordered Stop   01/19/23 1400  ceFEPIme (MAXIPIME) 2 g in sodium chloride 0.9 %  100 mL IVPB        2 g 200 mL/hr over 30 Minutes Intravenous Every 8 hours 01/19/23 0837     01/19/23 1000  linezolid (ZYVOX) IVPB 600 mg        600 mg 300 mL/hr over 60 Minutes Intravenous Every 12 hours 01/19/23 0804     01/19/23 0500  vancomycin (VANCOREADY) IVPB 1750 mg/350 mL  Status:  Discontinued        1,750  mg 175 mL/hr over 120 Minutes Intravenous Every 36 hours 01/18/23 0112 01/18/23 0936   01/18/23 1800  vancomycin (VANCOREADY) IVPB 1250 mg/250 mL  Status:  Discontinued        1,250 mg 166.7 mL/hr over 90 Minutes Intravenous Every 24 hours 01/18/23 0936 01/19/23 0804   01/18/23 0500  ceFEPIme (MAXIPIME) 2 g in sodium chloride 0.9 % 100 mL IVPB  Status:  Discontinued        2 g 200 mL/hr over 30 Minutes Intravenous Every 12 hours 01/18/23 0105 01/19/23 0837   01/17/23 1800  vancomycin (VANCOCIN) IVPB 1000 mg/200 mL premix       Placed in "Followed by" Linked Group   1,000 mg 200 mL/hr over 60 Minutes Intravenous  Once 01/17/23 1649 01/17/23 1910   01/17/23 1700  vancomycin (VANCOCIN) IVPB 1000 mg/200 mL premix       Placed in "Followed by" Linked Group   1,000 mg 200 mL/hr over 60 Minutes Intravenous  Once 01/17/23 1649 01/17/23 1810   01/17/23 1630  ceFEPIme (MAXIPIME) 2 g in sodium chloride 0.9 % 100 mL IVPB        2 g 200 mL/hr over 30 Minutes Intravenous  Once 01/17/23 1617 01/17/23 1700         Subjective: Patient seen and examined.  He has no complaints today.  Objective: Vitals:   01/22/23 0957 01/22/23 1228 01/22/23 2009 01/23/23 0400  BP: (!) 155/88 128/89 (!) 135/98 (!) 139/96  Pulse:  67 88 (!) 107  Resp:  20 20 16   Temp:  98.9 F (37.2 C) 98.3 F (36.8 C) 98.3 F (36.8 C)  TempSrc:  Oral Oral Oral  SpO2:  93% 94% 94%  Weight:      Height:        Intake/Output Summary (Last 24 hours) at 01/23/2023 0755 Last data filed at 01/22/2023 2040 Gross per 24 hour  Intake 360 ml  Output 2050 ml  Net -1690 ml   Filed Weights   01/17/23 1515 01/17/23 2120 01/18/23 0519  Weight: 132 kg 134.7 kg 135.9 kg    Examination:  General exam: Appears calm and comfortable  Respiratory system: Clear to auscultation. Respiratory effort normal. Cardiovascular system: S1 & S2 heard, RRR. No JVD, murmurs, rubs, gallops or clicks. No pedal edema. Gastrointestinal system:  Abdomen is nondistended, soft and nontender. No organomegaly or masses felt. Normal bowel sounds heard. Central nervous system: Alert and oriented.  Right hemiparesis from previous stroke and dysarthria. Skin: No rashes, lesions or ulcers  Data Reviewed: I have personally reviewed following labs and imaging studies  CBC: Recent Labs  Lab 01/17/23 1527 01/18/23 0513 01/19/23 0509 01/20/23 0911 01/23/23 0523  WBC 10.6* 9.3 9.0 8.1 7.7  NEUTROABS 8.4*  --   --   --   --   HGB 12.1* 10.9* 11.5* 11.6* 11.3*  HCT 36.5* 34.9* 34.7* 34.7* 35.3*  MCV 81.8 85.3 83.6 83.4 85.3  PLT 292 292 286 292 308   Basic Metabolic Panel: Recent Labs  Lab 01/17/23 1527 01/18/23 0513 01/19/23 0509 01/20/23 0911 01/23/23 0523  NA 139 139 137 138 139  K 3.3* 3.2* 3.1* 3.6 3.4*  CL 104 107 105 107 105  CO2 25 24 25 25 25   GLUCOSE 101* 86 93 151* 89  BUN 22 18 16 18 21   CREATININE 2.17* 1.88* 1.66* 1.53* 1.80*  CALCIUM 7.9* 7.5* 7.3* 7.7* 7.7*  MG  --   --   --   --  2.3   GFR: Estimated Creatinine Clearance: 59.9 mL/min (A) (by C-G formula based on SCr of 1.8 mg/dL (H)). Liver Function Tests: Recent Labs  Lab 01/17/23 1527  AST 24  ALT 14  ALKPHOS 60  BILITOT 0.8  PROT 8.6*  ALBUMIN 3.1*   No results for input(s): "LIPASE", "AMYLASE" in the last 168 hours. No results for input(s): "AMMONIA" in the last 168 hours. Coagulation Profile: No results for input(s): "INR", "PROTIME" in the last 168 hours. Cardiac Enzymes: No results for input(s): "CKTOTAL", "CKMB", "CKMBINDEX", "TROPONINI" in the last 168 hours. BNP (last 3 results) No results for input(s): "PROBNP" in the last 8760 hours. HbA1C: No results for input(s): "HGBA1C" in the last 72 hours. CBG: Recent Labs  Lab 01/21/23 2016 01/22/23 0716 01/22/23 1128 01/22/23 1608 01/22/23 2010  GLUCAP 99 93 97 109* 108*   Lipid Profile: No results for input(s): "CHOL", "HDL", "LDLCALC", "TRIG", "CHOLHDL", "LDLDIRECT" in the last 72  hours. Thyroid Function Tests: No results for input(s): "TSH", "T4TOTAL", "FREET4", "T3FREE", "THYROIDAB" in the last 72 hours. Anemia Panel: Recent Labs    01/20/23 0911  VITAMINB12 620   Sepsis Labs: Recent Labs  Lab 01/17/23 1529  LATICACIDVEN 1.2    Recent Results (from the past 240 hours)  Blood culture (routine x 2)     Status: None   Collection Time: 01/17/23  3:27 PM   Specimen: BLOOD  Result Value Ref Range Status   Specimen Description   Final    BLOOD LEFT ANTECUBITAL Performed at [facility], [address] [place], [place] [address]    Special Requests   Final    BOTTLES DRAWN AEROBIC AND ANAEROBIC Blood Culture results may not be optimal due to an inadequate volume of blood received in culture bottles Performed at [facility], [address] [place], [place] [address]    Culture   Final    NO GROWTH 5 DAYS Performed at [facility], [address] [place], [place] [address]    Report Status 01/22/2023 FINAL  Final  Resp panel by RT-PCR (RSV, Flu A&B, Covid) Anterior Nasal Swab     Status: None   Collection Time: 01/17/23  3:28 PM   Specimen: Anterior Nasal Swab  Result Value Ref Range Status   SARS Coronavirus 2 by RT PCR NEGATIVE NEGATIVE Final    Comment: (NOTE) SARS-CoV-2 target nucleic acids are NOT DETECTED.  The SARS-CoV-2 RNA is generally detectable in upper respiratory specimens during the acute phase of infection. The lowest concentration of SARS-CoV-2 viral copies this assay can detect is 138 copies/mL. A negative result does not preclude SARS-Cov-2 infection and should not be used as the sole basis for treatment or other patient management decisions. A negative result may occur with  improper specimen collection/handling, submission of specimen other than nasopharyngeal swab, presence of viral mutation(s) within the areas targeted by this assay, and inadequate number of viral copies(<138 copies/mL). A  negative result must be combined with clinical observations,  patient history, and epidemiological information. The expected result is Negative.  Fact Sheet for Patients:  BloggerCourse.com  Fact Sheet for Healthcare Providers:  SeriousBroker.it  This test is no t yet approved or cleared by the [place] FDA and  has been authorized for detection and/or diagnosis of SARS-CoV-2 by FDA under an Emergency Use Authorization (EUA). This EUA will remain  in effect (meaning this test can be used) for the duration of the COVID-19 declaration under Section 564(b)(1) of the Act, 21 U.S.C.section 360bbb-3(b)(1), unless the authorization is terminated  or revoked sooner.       Influenza A by PCR NEGATIVE NEGATIVE Final   Influenza B by PCR NEGATIVE NEGATIVE Final    Comment: (NOTE) The Xpert Xpress SARS-CoV-2/FLU/RSV plus assay is intended as an aid in the diagnosis of influenza from Nasopharyngeal swab specimens and should not be used as a sole basis for treatment. Nasal washings and aspirates are unacceptable for Xpert Xpress SARS-CoV-2/FLU/RSV testing.  Fact Sheet for Patients: BloggerCourse.com  Fact Sheet for Healthcare Providers: SeriousBroker.it  This test is not yet approved or cleared by the [place] FDA and has been authorized for detection and/or diagnosis of SARS-CoV-2 by FDA under an Emergency Use Authorization (EUA). This EUA will remain in effect (meaning this test can be used) for the duration of the COVID-19 declaration under Section 564(b)(1) of the Act, 21 U.S.C. section 360bbb-3(b)(1), unless the authorization is terminated or revoked.     Resp Syncytial Virus by PCR NEGATIVE NEGATIVE Final    Comment: (NOTE) Fact Sheet for Patients: BloggerCourse.com  Fact Sheet for Healthcare  Providers: SeriousBroker.it  This test is not yet approved or cleared by the [place] FDA and has been authorized for detection and/or diagnosis of SARS-CoV-2 by FDA under an Emergency Use Authorization (EUA). This EUA will remain in effect (meaning this test can be used) for the duration of the COVID-19 declaration under Section 564(b)(1) of the Act, 21 U.S.C. section 360bbb-3(b)(1), unless the authorization is terminated or revoked.  Performed at [facility], [address], [place], [place] [address]      Radiology Studies: No results found.   Scheduled Meds:  amLODipine  10 mg Oral Daily   apixaban  5 mg Oral BID   ascorbic acid  500 mg Oral Daily   atorvastatin  80 mg Oral QHS   carvedilol  25 mg Oral BID WC   diclofenac Sodium  2 g Topical QID   doxazosin  8 mg Oral QHS   dronedarone  400 mg Oral BID   feeding supplement (GLUCERNA SHAKE)  237 mL Oral BID BM   furosemide  40 mg Oral Daily   hydrALAZINE  100 mg Oral TID   insulin aspart  0-15 Units Subcutaneous TID WC   insulin aspart  0-5 Units Subcutaneous QHS   isosorbide mononitrate  120 mg Oral Daily   multivitamin with minerals  1 tablet Oral Daily   nutrition supplement (JUVEN)  1 packet Oral BID BM   polyethylene glycol  17 g Oral Daily   potassium chloride  40 mEq Oral Once   Ensure Max Protein  11 oz Oral Daily   senna-docusate  1 tablet Oral BID   zinc sulfate (50mg  elemental zinc)  220 mg Oral Daily   Continuous Infusions:  ceFEPime (MAXIPIME) IV 2 g (01/23/23 0535)   linezolid (ZYVOX) IV 600 mg (01/22/23 2206)     LOS: 6 days   Hughie Closs, MD [facility]  01/23/2023, 7:55 AM   *Please note that this is a verbal dictation therefore any spelling or grammatical errors are due to the "Dragon Medical One" system interpretation.  Please page via Amion and do not message via secure chat for urgent patient care matters. Secure chat can be used for non  urgent patient care matters.  How to contact the [facility] Attending or Consulting provider 7A - 7P or covering provider during after hours 7P -7A, for this patient?  Check the care team in [facility] and look for a) attending/consulting TRH provider listed and b) the [facility] team listed. Page or secure chat 7A-7P. Log into [email or web address] and use [place]'s universal password to access. If you do not have the password, please contact the hospital operator. Locate the [facility] provider you are looking for under [facility] and page to a number that you can be directly reached. If you still have difficulty reaching the provider, please page the [facility] (Director on Call) for the Hospitalists listed on amion for assistance.

## 2023-01-23 NOTE — Progress Notes (Signed)
Physical Therapy Treatment Patient Details Name: Justin Molina. MRN: [id number] DOB: December 17, 1960 Today's Date: 01/23/2023   History of Present Illness Mr. Rosener is a 62 yr old male admitted to the hospital 01-17-23 with fevers, R LE wound, and R LE edema. Pt found to have sepsis due to R LE wound and cellulitis. PMH: CVA with R hemiparesis and aphasia, DM II, CHF, OSA, HTN, brain surgery, hernia repair    PT Comments  Pt agreeable to working with therapy. He participated well. He puts forth good effort and is motivated to mobilize. Plan is for ST SNF.     If plan is discharge home, recommend the following: A little help with bathing/dressing/bathroom;Assistance with cooking/housework;Assist for transportation;Help with stairs or ramp for entrance;A little help with walking and/or transfers   Can travel by private vehicle     No  Equipment Recommendations  None recommended by PT    Recommendations for Other Services       Precautions / Restrictions Precautions Precautions: Fall Precaution Comments: R hemiparesis 2* prior CVA Restrictions Weight Bearing Restrictions Per Provider Order: No     Mobility  Bed Mobility Overal bed mobility: Needs Assistance Bed Mobility: Supine to Sit     Supine to sit: Contact guard, HOB elevated, Used rails Sit to supine: Min assist, HOB elevated, Used rails   General bed mobility comments: Increased time, use of bedrails; assist for RLE back onto bed. Cues for safety.    Transfers Overall transfer level: Needs assistance Equipment used: Rolling walker (2 wheels) Transfers: Sit to/from Stand Sit to Stand: Min assist           General transfer comment: bed highly elevated. increased time. Cues for safety.    Ambulation/Gait Ambulation/Gait assistance: Min assist Gait Distance (Feet): 50 Feet Assistive device: Rolling walker (2 wheels) Gait Pattern/deviations: Step-to pattern, Decreased step length - right, Decreased step  length - left, Shuffle, Trunk flexed       General Gait Details: Intermittent assist to maneuver/manage RW 2* pt unable to functionally use R UE. Pt tolerated distance well.   Stairs             Wheelchair Mobility     Tilt Bed    Modified Rankin (Stroke Patients Only)       Balance Overall balance assessment: Needs assistance         Standing balance support: Single extremity supported, During functional activity, Reliant on assistive device for balance Standing balance-Leahy Scale: Poor                              Cognition Arousal: Alert Behavior During Therapy: WFL for tasks assessed/performed Overall Cognitive Status: Difficult to assess                                 General Comments: h/o expressive aphasia 2* CVA, follows commands well, answers yes/no questions with increased time, uses gestures and a few words but often requires increased time to get correct word out.        Exercises      General Comments        Pertinent Vitals/Pain Pain Assessment Pain Assessment: Faces Faces Pain Scale: Hurts a little bit Pain Location: R LE Pain Descriptors / Indicators: Grimacing, Guarding Pain Intervention(s): Limited activity within patient's tolerance, Monitored during session, Repositioned    Home Living  Prior Function            PT Goals (current goals can now be found in the care plan section) Progress towards PT goals: Progressing toward goals    Frequency    Min 1X/week      PT Plan      Co-evaluation              AM-PAC PT "6 Clicks" Mobility   Outcome Measure  Help needed turning from your back to your side while in a flat bed without using bedrails?: A Little Help needed moving from lying on your back to sitting on the side of a flat bed without using bedrails?: A Little Help needed moving to and from a bed to a chair (including a wheelchair)?: A  Little Help needed standing up from a chair using your arms (e.g., wheelchair or bedside chair)?: A Little Help needed to walk in hospital room?: A Little Help needed climbing 3-5 steps with a railing? : A Lot 6 Click Score: 17    End of Session Equipment Utilized During Treatment: Gait belt Activity Tolerance: Patient tolerated treatment well Patient left: in bed;with call bell/phone within reach;with bed alarm set   PT Visit Diagnosis: Unsteadiness on feet (R26.81);Hemiplegia and hemiparesis;Pain Hemiplegia - Right/Left: Right Hemiplegia - dominant/non-dominant: Dominant     Time: 5621-3086 PT Time Calculation (min) (ACUTE ONLY): 29 min  Charges:    $Gait Training: 23-37 mins PT General Charges $$ ACUTE PT VISIT: 1 Visit                        Faye Ramsay, PT Acute Rehabilitation  Office: [phone]

## 2023-01-23 NOTE — TOC Progression Note (Signed)
Transition of Care (TOC) - Progression Note    Patient Details  Name: Justin Molina. MRN: [id number] Date of Birth: 1960-02-10  Transition of Care [facility]) CM/SW Contact  Deolinda Frid, Olegario Messier, RN Phone Number: 01/23/2023, 11:26 AM  Clinical Narrative:Spoke to Tonya(spouse) she will review bed choices & let me know likely today-await choice prior auth.       Expected Discharge Plan: Skilled Nursing Facility Barriers to Discharge: Continued Medical Work up  Expected Discharge Plan and Services                                               Social Determinants of Health (SDOH) Interventions SDOH Screenings   Food Insecurity: No Food Insecurity (01/17/2023)  Housing: Low Risk  (01/18/2023)  Transportation Needs: No Transportation Needs (01/17/2023)  Utilities: Not At Risk (01/17/2023)  Tobacco Use: Low Risk  (01/17/2023)    Readmission Risk Interventions     No data to display

## 2023-01-24 DIAGNOSIS — A419 Sepsis, unspecified organism: Secondary | ICD-10-CM | POA: Diagnosis not present

## 2023-01-24 DIAGNOSIS — N179 Acute kidney failure, unspecified: Secondary | ICD-10-CM | POA: Diagnosis not present

## 2023-01-24 DIAGNOSIS — R6520 Severe sepsis without septic shock: Secondary | ICD-10-CM | POA: Diagnosis not present

## 2023-01-24 LAB — GLUCOSE, CAPILLARY
Glucose-Capillary: 107 mg/dL — ABNORMAL HIGH (ref 70–99)
Glucose-Capillary: 117 mg/dL — ABNORMAL HIGH (ref 70–99)
Glucose-Capillary: 100 mg/dL — ABNORMAL HIGH (ref 70–99)
Glucose-Capillary: 121 mg/dL — ABNORMAL HIGH (ref 70–99)

## 2023-01-24 MED ORDER — HYDRALAZINE HCL 20 MG/ML IJ SOLN
5.0000 mg | Freq: Four times a day (QID) | INTRAMUSCULAR | Status: AC | PRN
  Filled 2023-01-24: qty 1

## 2023-01-24 NOTE — TOC Progression Note (Signed)
Transition of Care (TOC) - Progression Note    Patient Details  Name: Justin Molina. MRN: [id number] Date of Birth: 03/25/1960  Transition of Care [facility]) CM/SW Contact  Shadasia Oldfield, Olegario Messier, RN Phone Number: 01/24/2023, 11:51 AM  Clinical Narrative:  Tonya(spouse) chose [employer] rep Lowella Bandy aware-pending Berkley Harvey YQ#[id number] Berkley Harvey.     Expected Discharge Plan: Skilled Nursing Facility Barriers to Discharge: Insurance Authorization  Expected Discharge Plan and Services                                               Social Determinants of Health (SDOH) Interventions SDOH Screenings   Food Insecurity: No Food Insecurity (01/17/2023)  Housing: Low Risk  (01/18/2023)  Transportation Needs: No Transportation Needs (01/17/2023)  Utilities: Not At Risk (01/17/2023)  Tobacco Use: Low Risk  (01/17/2023)    Readmission Risk Interventions     No data to display

## 2023-01-24 NOTE — Progress Notes (Signed)
PROGRESS NOTE    Justin Molina.  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  Per wife wound was the size of a nickel about a month ago and has rapidly grown.  Patient has been having a lot of swelling.  He has had multiple changes to his medication recently.  he developed fever and vomiting.  Evaluation in the ED consistent with sepsis.  Currently stable and pending placement and waiting for insurance authorization.    Assessment & Plan:   Principal Problem:   Sepsis (HCC) Active Problems:   DM (diabetes mellitus), type 2 with renal complications (HCC)   History of CVA with residual deficit   CKD (chronic kidney disease) stage 3, GFR 30-59 ml/min (HCC)   OSA (obstructive sleep apnea)   Cellulitis  Sepsis secondary to Right lower extremity wound with cellulitis I removed his dressing, he had very small pus, no erythema, per wife who was at the bedside and saw the wound and verified that the cellulitis and wound has significantly improved since admission.  Patient has received 7 days of IV antibiotics, transitioned to linezolid on 01/23/2023 with plan to continue for 3 more days.   2-Diabetes type 2 Hold Januvia and Ozempic.  SSI.  Blood sugar controlled.  3-Nausea, vomiting.  Symptoms resolved.  KUB negative.   History of A-fib: Continue with Eliquis Resume carvedilol.  Resume Moltaq 12/14   4-History of CVA with residual deficits: Patient has dysarthria at baseline right hemiparesis.  Walk with a cane   CKD stage IIIb: Previous Cr range 1.9--1.6 Monitor on lasix.  Stable.  Continue Lasix.   Hypokalemia; resolved.  Recheck in the morning.   CAD; continue with imdur.  Resume lasix.   ECHO: Normal EF   HTN; increase hydralazine to 3 times daily yesterday, blood pressure still elevated at times, mostly diastolic.  Will continue current Norvasc, Imdur and Coreg and all of those are maximized.    Acute metabolic Encephalopathy; -CT head unremarkable.  Appears to be resolved and at baseline.   Dry eye; continue artificial tear.  Left knee pain: Resolved with Voltaren gel.  DVT prophylaxis: Eliquis   Code Status: Full Code  Family Communication:  None present at bedside.  Plan of care discussed with patient in length and he/she verbalized understanding and agreed with it.  Status is: Inpatient Remains inpatient appropriate because: Patient is now medically stable, pending placement and awaiting insurance authorization.   Estimated body mass index is 41.79 kg/m as calculated from the following:   Height as of this encounter: 5\' 11"  (1.803 m).   Weight as of this encounter: 135.9 kg.    Nutritional Assessment: Body mass index is 41.79 kg/m.Marland Kitchen Seen by dietician.  I agree with the assessment and plan as outlined below: Nutrition Status: Nutrition Problem: Increased nutrient needs Etiology: wound healing Signs/Symptoms: estimated needs Interventions: Refer to RD note for recommendations, Glucerna shake, MVI, Premier Protein, Juven  . Skin Assessment: I have examined the patient's skin and I agree with the wound assessment as performed by the wound care RN as outlined below:    Consultants:  None  Procedures:  None  Antimicrobials:  Anti-infectives (From admission, onward)    Start     Dose/Rate Route Frequency Ordered Stop  01/23/23 2200  linezolid (ZYVOX) tablet 600 mg        600 mg Oral Every 12 hours 01/23/23 1212 01/27/23 0959   01/19/23 1400  ceFEPIme (MAXIPIME) 2 g in sodium chloride 0.9 % 100 mL IVPB  Status:  Discontinued        2 g 200 mL/hr over 30 Minutes Intravenous Every 8 hours 01/19/23 0837 01/23/23 1212   01/19/23 1000  linezolid  (ZYVOX) IVPB 600 mg  Status:  Discontinued        600 mg 300 mL/hr over 60 Minutes Intravenous Every 12 hours 01/19/23 0804 01/23/23 1212   01/19/23 0500  vancomycin (VANCOREADY) IVPB 1750 mg/350 mL  Status:  Discontinued        1,750 mg 175 mL/hr over 120 Minutes Intravenous Every 36 hours 01/18/23 0112 01/18/23 0936   01/18/23 1800  vancomycin (VANCOREADY) IVPB 1250 mg/250 mL  Status:  Discontinued        1,250 mg 166.7 mL/hr over 90 Minutes Intravenous Every 24 hours 01/18/23 0936 01/19/23 0804   01/18/23 0500  ceFEPIme (MAXIPIME) 2 g in sodium chloride 0.9 % 100 mL IVPB  Status:  Discontinued        2 g 200 mL/hr over 30 Minutes Intravenous Every 12 hours 01/18/23 0105 01/19/23 0837   01/17/23 1800  vancomycin (VANCOCIN) IVPB 1000 mg/200 mL premix       Placed in "Followed by" Linked Group   1,000 mg 200 mL/hr over 60 Minutes Intravenous  Once 01/17/23 1649 01/17/23 1910   01/17/23 1700  vancomycin (VANCOCIN) IVPB 1000 mg/200 mL premix       Placed in "Followed by" Linked Group   1,000 mg 200 mL/hr over 60 Minutes Intravenous  Once 01/17/23 1649 01/17/23 1810   01/17/23 1630  ceFEPIme (MAXIPIME) 2 g in sodium chloride 0.9 % 100 mL IVPB        2 g 200 mL/hr over 30 Minutes Intravenous  Once 01/17/23 1617 01/17/23 1700         Subjective: Seen and examined.  Wife at the bedside.  Patient has no complaints.  Objective: Vitals:   01/24/23 0550 01/24/23 0623 01/24/23 0652 01/24/23 1240  BP: (!) 145/100 (!) 150/99 (!) 148/99 (!) 117/92  Pulse: (!) 109 (!) 101 (!) 105 97  Resp:    20  Temp:    98.4 F (36.9 C)  TempSrc:    Oral  SpO2:  95% 98% 93%  Weight:      Height:        Intake/Output Summary (Last 24 hours) at 01/24/2023 1316 Last data filed at 01/24/2023 1242 Gross per 24 hour  Intake 120 ml  Output 1250 ml  Net -1130 ml   Filed Weights   01/17/23 1515 01/17/23 2120 01/18/23 0519  Weight: 132 kg 134.7 kg 135.9 kg    Examination:  General exam: Appears  calm and comfortable  Respiratory system: Clear to auscultation. Respiratory effort normal. Cardiovascular system: S1 & S2 heard, RRR. No JVD, murmurs, rubs, gallops or clicks. No pedal edema. Gastrointestinal system: Abdomen is nondistended, soft and nontender. No organomegaly or masses felt. Normal bowel sounds heard. Central nervous system: Alert and oriented.  Right hemiparesis from previous stroke and dysarthria. Skin: No rashes, lesions or ulcers  Data Reviewed: I have personally reviewed following labs and imaging studies  CBC: Recent Labs  Lab 01/17/23 1527 01/18/23 0513 01/19/23 0509 01/20/23 0911 01/23/23 0523  WBC 10.6* 9.3 9.0 8.1 7.7  NEUTROABS 8.4*  --   --   --   --  HGB 12.1* 10.9* 11.5* 11.6* 11.3*  HCT 36.5* 34.9* 34.7* 34.7* 35.3*  MCV 81.8 85.3 83.6 83.4 85.3  PLT 292 292 286 292 308   Basic Metabolic Panel: Recent Labs  Lab 01/17/23 1527 01/18/23 0513 01/19/23 0509 01/20/23 0911 01/23/23 0523  NA 139 139 137 138 139  K 3.3* 3.2* 3.1* 3.6 3.4*  CL 104 107 105 107 105  CO2 25 24 25 25 25   GLUCOSE 101* 86 93 151* 89  BUN 22 18 16 18 21   CREATININE 2.17* 1.88* 1.66* 1.53* 1.80*  CALCIUM 7.9* 7.5* 7.3* 7.7* 7.7*  MG  --   --   --   --  2.3   GFR: Estimated Creatinine Clearance: 59.9 mL/min (A) (by C-G formula based on SCr of 1.8 mg/dL (H)). Liver Function Tests: Recent Labs  Lab 01/17/23 1527  AST 24  ALT 14  ALKPHOS 60  BILITOT 0.8  PROT 8.6*  ALBUMIN 3.1*   No results for input(s): "LIPASE", "AMYLASE" in the last 168 hours. No results for input(s): "AMMONIA" in the last 168 hours. Coagulation Profile: No results for input(s): "INR", "PROTIME" in the last 168 hours. Cardiac Enzymes: No results for input(s): "CKTOTAL", "CKMB", "CKMBINDEX", "TROPONINI" in the last 168 hours. BNP (last 3 results) No results for input(s): "PROBNP" in the last 8760 hours. HbA1C: No results for input(s): "HGBA1C" in the last 72 hours. CBG: Recent Labs   Lab 01/23/23 1224 01/23/23 1554 01/23/23 1949 01/24/23 0719 01/24/23 1102  GLUCAP 98 126* 95 107* 117*   Lipid Profile: No results for input(s): "CHOL", "HDL", "LDLCALC", "TRIG", "CHOLHDL", "LDLDIRECT" in the last 72 hours. Thyroid Function Tests: No results for input(s): "TSH", "T4TOTAL", "FREET4", "T3FREE", "THYROIDAB" in the last 72 hours. Anemia Panel: No results for input(s): "VITAMINB12", "FOLATE", "FERRITIN", "TIBC", "IRON", "RETICCTPCT" in the last 72 hours.  Sepsis Labs: Recent Labs  Lab 01/17/23 1529  LATICACIDVEN 1.2    Recent Results (from the past 240 hours)  Blood culture (routine x 2)     Status: None   Collection Time: 01/17/23  3:27 PM   Specimen: BLOOD  Result Value Ref Range Status   Specimen Description   Final    BLOOD LEFT ANTECUBITAL Performed at [facility], [address] [place], [place] [address]    Special Requests   Final    BOTTLES DRAWN AEROBIC AND ANAEROBIC Blood Culture results may not be optimal due to an inadequate volume of blood received in culture bottles Performed at [facility], [address] [place], [place] [address]    Culture   Final    NO GROWTH 5 DAYS Performed at [facility], [address], [place], [place] [address]    Report Status 01/22/2023 FINAL  Final  Resp panel by RT-PCR (RSV, Flu A&B, Covid) Anterior Nasal Swab     Status: None   Collection Time: 01/17/23  3:28 PM   Specimen: Anterior Nasal Swab  Result Value Ref Range Status   SARS Coronavirus 2 by RT PCR NEGATIVE NEGATIVE Final    Comment: (NOTE) SARS-CoV-2 target nucleic acids are NOT DETECTED.  The SARS-CoV-2 RNA is generally detectable in upper respiratory specimens during the acute phase of infection. The lowest concentration of SARS-CoV-2 viral copies this assay can detect is 138 copies/mL. A negative result does not preclude SARS-Cov-2 infection and should not be used as the sole basis for treatment or other  patient management decisions. A negative result may occur  with  improper specimen collection/handling, submission of specimen other than nasopharyngeal swab, presence of viral mutation(s) within the areas targeted by this assay, and inadequate number of viral copies(<138 copies/mL). A negative result must be combined with clinical observations, patient history, and epidemiological information. The expected result is Negative.  Fact Sheet for Patients:  BloggerCourse.com  Fact Sheet for Healthcare Providers:  SeriousBroker.it  This test is no t yet approved or cleared by the [place] FDA and  has been authorized for detection and/or diagnosis of SARS-CoV-2 by FDA under an Emergency Use Authorization (EUA). This EUA will remain  in effect (meaning this test can be used) for the duration of the COVID-19 declaration under Section 564(b)(1) of the Act, 21 U.S.C.section 360bbb-3(b)(1), unless the authorization is terminated  or revoked sooner.       Influenza A by PCR NEGATIVE NEGATIVE Final   Influenza B by PCR NEGATIVE NEGATIVE Final    Comment: (NOTE) The Xpert Xpress SARS-CoV-2/FLU/RSV plus assay is intended as an aid in the diagnosis of influenza from Nasopharyngeal swab specimens and should not be used as a sole basis for treatment. Nasal washings and aspirates are unacceptable for Xpert Xpress SARS-CoV-2/FLU/RSV testing.  Fact Sheet for Patients: BloggerCourse.com  Fact Sheet for Healthcare Providers: SeriousBroker.it  This test is not yet approved or cleared by the [place] FDA and has been authorized for detection and/or diagnosis of SARS-CoV-2 by FDA under an Emergency Use Authorization (EUA). This EUA will remain in effect (meaning this test can be used) for the duration of the COVID-19 declaration under Section 564(b)(1) of the Act, 21 U.S.C. section  360bbb-3(b)(1), unless the authorization is terminated or revoked.     Resp Syncytial Virus by PCR NEGATIVE NEGATIVE Final    Comment: (NOTE) Fact Sheet for Patients: BloggerCourse.com  Fact Sheet for Healthcare Providers: SeriousBroker.it  This test is not yet approved or cleared by the [place] FDA and has been authorized for detection and/or diagnosis of SARS-CoV-2 by FDA under an Emergency Use Authorization (EUA). This EUA will remain in effect (meaning this test can be used) for the duration of the COVID-19 declaration under Section 564(b)(1) of the Act, 21 U.S.C. section 360bbb-3(b)(1), unless the authorization is terminated or revoked.  Performed at [facility], [address] [place], [place] [address]      Radiology Studies: No results found.   Scheduled Meds:  amLODipine  10 mg Oral Daily   apixaban  5 mg Oral BID   ascorbic acid  500 mg Oral Daily   atorvastatin  80 mg Oral QHS   carvedilol  25 mg Oral BID WC   diclofenac Sodium  2 g Topical QID   doxazosin  8 mg Oral QHS   dronedarone  400 mg Oral BID   feeding supplement (GLUCERNA SHAKE)  237 mL Oral BID BM   furosemide  40 mg Oral Daily   hydrALAZINE  100 mg Oral TID   insulin aspart  0-15 Units Subcutaneous TID WC   insulin aspart  0-5 Units Subcutaneous QHS   isosorbide mononitrate  120 mg Oral Daily   linezolid  600 mg Oral Q12H   multivitamin with minerals  1 tablet Oral Daily   nutrition supplement (JUVEN)  1 packet Oral BID BM   polyethylene glycol  17 g Oral Daily   Ensure Max Protein  11 oz Oral Daily   senna-docusate  1 tablet Oral BID   zinc sulfate (50mg  elemental zinc)  220  mg Oral Daily   Continuous Infusions:     LOS: 7 days   Hughie Closs, MD [facility]  01/24/2023, 1:16 PM   *Please note that this is a verbal dictation therefore any spelling or grammatical errors are due to the "Dragon Medical One"  system interpretation.  Please page via Amion and do not message via secure chat for urgent patient care matters. Secure chat can be used for non urgent patient care matters.  How to contact the [facility] Attending or Consulting provider 7A - 7P or covering provider during after hours 7P -7A, for this patient?  Check the care team in [facility] and look for a) attending/consulting TR[facility] provider listed and b) the [facility] team listed. Page or secure chat 7A-7P. Log into [email or web address] and use [place]'s universal password to access. If you do not have the password, please contact the hospital operator. Locate the [facility] provider you are looking for under [facility] and page to a number that you can be directly reached. If you still have difficulty reaching the provider, please page the [facility] (Director on Call) for the Hospitalists listed on amion for assistance.

## 2023-01-24 NOTE — Progress Notes (Addendum)
OT Cancellation Note  Patient Details Name: Justin Molina. MRN: [id number] DOB: Jan 25, 1961   Cancelled Treatment:    Reason Eval/Treat Not Completed: Other (comment). Pt and spouse with gentle deferral, as they had multiple visitors present in the room. Asked for therapy to check back later if able.   Reuben Likes 01/24/2023, 5:24 PM

## 2023-01-24 NOTE — Plan of Care (Signed)
  Problem: Health Behavior/Discharge Planning: Goal: Ability to manage health-related needs will improve Outcome: Progressing   Problem: Clinical Measurements: Goal: Ability to maintain clinical measurements within normal limits will improve Outcome: Progressing Goal: Will remain free from infection Outcome: Progressing Goal: Diagnostic test results will improve Outcome: Progressing   

## 2023-01-25 DIAGNOSIS — R6520 Severe sepsis without septic shock: Secondary | ICD-10-CM | POA: Diagnosis not present

## 2023-01-25 DIAGNOSIS — N179 Acute kidney failure, unspecified: Secondary | ICD-10-CM | POA: Diagnosis not present

## 2023-01-25 DIAGNOSIS — A419 Sepsis, unspecified organism: Secondary | ICD-10-CM | POA: Diagnosis not present

## 2023-01-25 LAB — BASIC METABOLIC PANEL
Anion gap: 6 (ref 5–15)
BUN: 25 mg/dL — ABNORMAL HIGH (ref 8–23)
CO2: 28 mmol/L (ref 22–32)
Calcium: 7.9 mg/dL — ABNORMAL LOW (ref 8.9–10.3)
Chloride: 104 mmol/L (ref 98–111)
Creatinine, Ser: 1.95 mg/dL — ABNORMAL HIGH (ref 0.61–1.24)
GFR, Estimated: 38 mL/min — ABNORMAL LOW (ref >60)
Glucose, Bld: 96 mg/dL (ref 70–99)
Potassium: 3.3 mmol/L — ABNORMAL LOW (ref 3.5–5.1)
Sodium: 138 mmol/L (ref 135–145)

## 2023-01-25 LAB — GLUCOSE, CAPILLARY
Glucose-Capillary: 94 mg/dL (ref 70–99)
Glucose-Capillary: 124 mg/dL — ABNORMAL HIGH (ref 70–99)
Glucose-Capillary: 102 mg/dL — ABNORMAL HIGH (ref 70–99)

## 2023-01-25 MED ORDER — LISINOPRIL 20 MG PO TABS
20.0000 mg | ORAL_TABLET | Freq: Every day | ORAL | Status: DC
  Administered 2023-01-25: 20 mg via ORAL
  Filled 2023-01-25: qty 1

## 2023-01-25 MED ORDER — LISINOPRIL 20 MG PO TABS: 20.0000 mg | ORAL_TABLET | Freq: Every day | ORAL | 0 refills | Status: DC

## 2023-01-25 MED ORDER — POTASSIUM CHLORIDE CRYS ER 20 MEQ PO TBCR
40.0000 meq | EXTENDED_RELEASE_TABLET | ORAL | Status: AC
  Administered 2023-01-25 (×2): 40 meq via ORAL
  Filled 2023-01-25 (×2): qty 2

## 2023-01-25 MED ORDER — LINEZOLID 600 MG PO TABS: 600.0000 mg | ORAL_TABLET | Freq: Two times a day (BID) | ORAL | 0 refills | Status: AC

## 2023-01-25 MED ORDER — HYDRALAZINE HCL 50 MG PO TABS
150.0000 mg | ORAL_TABLET | Freq: Three times a day (TID) | ORAL | Status: DC
  Administered 2023-01-25 (×2): 150 mg via ORAL
  Filled 2023-01-25 (×2): qty 3

## 2023-01-25 MED ORDER — HYDRALAZINE HCL 50 MG PO TABS: 150.0000 mg | ORAL_TABLET | Freq: Three times a day (TID) | ORAL | 0 refills | Status: DC

## 2023-01-25 NOTE — Progress Notes (Signed)
Pt discharged via stretcher w/ PTAR. Vitals stable. Report given to [place] at [facility]. Wife at bedside.

## 2023-01-25 NOTE — Discharge Summary (Signed)
Physician Discharge Summary  Justin Molina. VQQ:[id number] DOB: Dec 13, 1960 DOA: 01/17/2023  PCP: Theodis Shove, DO  Admit date: 01/17/2023 Discharge date: 01/25/2023 30 Day Unplanned Readmission Risk Score    Flowsheet Row ED to Hosp-Admission (Current) from 01/17/2023 in [facility] 4TH FLOOR PROGRESSIVE CARE AND UROLOGY  30 Day Unplanned Readmission Risk Score (%) 30.63 Filed at 01/25/2023 1200       This score is the patient's risk of an unplanned readmission within 30 days of being discharged (0 -100%). The score is based on dignosis, age, lab data, medications, orders, and past utilization.   Low:  0-14.9   Medium: 15-21.9   High: 22-29.9   Extreme: 30 and above          Admitted From: Home Disposition: SNF  Recommendations for Outpatient Follow-up:  Follow up with PCP in 1-2 weeks Please obtain BMP/CBC in one week Please follow up with your PCP on the following pending results: Unresulted Labs (From admission, onward)    None         Home Health: None Equipment/Devices: None  Discharge Condition: Stable CODE STATUS: Full code Diet recommendation: Diabetic  Subjective: Seen and examined.  He has no complaints.  Discussed with his wife about the plan of discharge and she is in agreement.  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  Per wife wound was the size of a nickel about a month ago and has rapidly grown.  Patient has been having a lot of swelling.  He has had multiple changes to his medication recently.  he developed fever and vomiting.  Evaluation in the ED consistent with sepsis.  Admitted under hospital service.  Details below.   Sepsis secondary to Right lower extremity wound with cellulitis I removed his dressing, he had very  small pus, no erythema, per wife who was at the bedside and saw the wound and verified that the cellulitis and wound has significantly improved since admission.  Patient received 7 days of IV antibiotics, transitioned to linezolid on 01/23/2023 and is being discharged on 3 days of linezolid.   2-Diabetes type 2 Resume home medications.   3-Nausea, vomiting.  Symptoms resolved.  KUB negative.   History of A-fib: Continue with Eliquis Resume carvedilol.    4-History of CVA with residual deficits: Patient has dysarthria at baseline right hemiparesis.  Walk with a cane   CKD stage IIIb: Previous Cr range 1.9--1.6 Monitor on lasix.  Stable.  Continue Lasix.   Hypokalemia; low again, will replenish.   CAD; continue with imdur.  Resume lasix.  ECHO: Normal EF   HTN; patient's blood pressure remained elevated despite of resuming his home medications, we adjusted his medications, initially increase his hydralazine from twice daily 100 mg to 3 times daily and then increased today to 150 mg 3 times daily and added lisinopril 20 mg and resuming his amlodipine, Imdur and Coreg.  Discussed with his wife and answered all the questions.   Acute metabolic Encephalopathy; -CT head unremarkable.  Appears to be resolved and at baseline.   Dry eye; continue artificial tear.   Left knee pain: Resolved with Voltaren gel.  Discharge plan was discussed with patient and/or family member and they verbalized understanding and agreed with it.  Discharge Diagnoses:  Principal Problem:   Sepsis (HCC) Active Problems:  DM (diabetes mellitus), type 2 with renal complications (HCC)   History of CVA with residual deficit   CKD (chronic kidney disease) stage 3, GFR 30-59 ml/min (HCC)   OSA (obstructive sleep apnea)   Cellulitis    Discharge Instructions   Allergies as of 01/25/2023       Reactions   Contrast Media [iodinated Contrast Media] Nausea And Vomiting, Swelling        Medication List      STOP taking these medications    bisacodyl 5 MG EC tablet Commonly known as: DULCOLAX   insulin detemir 100 UNIT/ML injection Commonly known as: LEVEMIR   loperamide 2 MG capsule Commonly known as: IMODIUM   ondansetron 4 MG disintegrating tablet Commonly known as: ZOFRAN-ODT   predniSONE 10 MG tablet Commonly known as: DELTASONE   traMADol 50 MG tablet Commonly known as: ULTRAM       TAKE these medications    Accu-Chek Aviva Plus w/Device Kit Check blood sugar TID  & QHS   acetaminophen 500 MG tablet Commonly known as: TYLENOL Take 1 tablet (500 mg total) by mouth every 6 (six) hours as needed.   albuterol 108 (90 Base) MCG/ACT inhaler Commonly known as: VENTOLIN HFA Inhale 2 puffs into the lungs every 4 (four) hours as needed for wheezing or shortness of breath.   amLODipine 5 MG tablet Commonly known as: NORVASC Take 1 tablet (5 mg total) by mouth daily.   atorvastatin 80 MG tablet Commonly known as: LIPITOR Take 1 tablet (80 mg total) by mouth daily.   Bathtub Safety Rail Misc Patient needs safety rails for shower   blood glucose meter kit and supplies Kit Dispense based on patient and insurance preference. Use up to four times daily as directed.   Blood Pressure Cuff Misc 1 Units by Does not apply route daily.   carvedilol 25 MG tablet Commonly known as: COREG Take 1 tablet (25 mg total) by mouth 2 (two) times daily with a meal. Patient needs office visit for more refills   doxazosin 8 MG tablet Commonly known as: CARDURA Take 1 tablet (8 mg total) by mouth daily.   Eliquis 5 MG Tabs tablet Generic drug: apixaban Take 1 tablet (5 mg total) by mouth 2 (two) times daily.   fluticasone 50 MCG/ACT nasal spray Commonly known as: FLONASE Place 2 sprays into both nostrils daily as needed for allergies or rhinitis.   furosemide 40 MG tablet Commonly known as: LASIX Take 40 mg by mouth daily.   glucose blood test strip Commonly known as:  Accu-Chek Aviva Plus Use as instructed for TID and QHS blood glucose testing   hydrALAZINE 50 MG tablet Commonly known as: APRESOLINE Take 3 tablets (150 mg total) by mouth 3 (three) times daily. What changed:  medication strength how much to take   isosorbide mononitrate 120 MG 24 hr tablet Commonly known as: IMDUR Take 1 tablet (120 mg total) by mouth daily.   Jardiance 10 MG Tabs tablet Generic drug: empagliflozin Take 10 mg by mouth daily.   Lancets 28G Misc Check blood sugar TID & QHS   Lantus SoloStar 100 UNIT/ML Solostar Pen Generic drug: insulin glargine Inject 15 Units into the skin at bedtime.   linezolid 600 MG tablet Commonly known as: ZYVOX Take 1 tablet (600 mg total) by mouth every 12 (twelve) hours for 3 days.   lisinopril 20 MG tablet Commonly known as: ZESTRIL Take 1 tablet (20 mg total) by mouth daily. Start taking on: January 26, 2023   loratadine 10 MG tablet Commonly known as: CLARITIN Take 10 mg by mouth daily as needed.   Multaq 400 MG tablet Generic drug: dronedarone Take 1 tablet (400 mg total) by mouth 2 (two) times daily.   Ozempic (0.25 or 0.5 MG/DOSE) 2 MG/3ML Sopn Generic drug: Semaglutide(0.25 or 0.5MG /DOS) Inject 0.5 mg into the skin once a week. Every Sunday   potassium chloride 10 MEQ tablet Commonly known as: KLOR-CON M Take 1 tablet (10 mEq total) by mouth daily.        Contact information for follow-up providers     Combs, Allison Brooke, DO Follow up in 1 week(s).   Specialty: Geriatric Medicine Contact information: [address] [place] [address] [phone]              Contact information for after-discharge care     Destination     [facility] Preferred SNF .   Service: Skilled Nursing Contact information: [address] [place] [address] [phone]                    Allergies  Allergen Reactions   Contrast Media [Iodinated Contrast Media]  Nausea And Vomiting and Swelling    Consultations: None   Procedures/Studies: DG Abd 1 View Result Date: 01/20/2023 CLINICAL DATA:  177057 Nausea AND vomiting [id number] EXAM: ABDOMEN - 1 VIEW COMPARISON:  None Available. FINDINGS: The bowel gas pattern is normal. No radio-opaque calculi or other significant radiographic abnormality are seen. IMPRESSION: Negative. Electronically Signed   By: Nicholas  Plundo D.O.   On: 01/20/2023 15:38   CT HEAD WO CONTRAST (5MM) Result Date: 01/20/2023 CLINICAL DATA:  62 year old male with altered mental status. EXAM: CT HEAD WITHOUT CONTRAST TECHNIQUE: Contiguous axial images were obtained from the base of the skull through the vertex without intravenous contrast. RADIATION DOSE REDUCTION: This exam was performed according to the departmental dose-optimization program which includes automated exposure control, adjustment of the mA and/or kV according to patient size and/or use of iterative reconstruction technique. COMPARISON:  Head CT 08/27/2014. FINDINGS: Brain: Chronic left hemisphere encephalomalacia with ex vacuo enlargement of the left lateral ventricle has not significantly changed since 2016. Mild associated dystrophic calcification. Associated midbrain Wallerian degeneration. Subtle associated right cerebellar Wallerian degeneration. No superimposed midline shift, ventriculomegaly, mass effect, evidence of mass lesion, intracranial hemorrhage or evidence of cortically based acute infarction. Patchy mild right hemisphere white matter scattered hypodensity. Vascular: Calcified atherosclerosis at the skull base. No suspicious intracranial vascular hyperdensity. Skull: Chronic left frontotemporal craniotomy appears stable. No acute osseous abnormality identified. Sinuses/Orbits: Visualized paranasal sinuses and mastoids are stable and well aerated. Other: Chronic postoperative changes to the scalp. Visualized orbit soft tissues are within normal limits. IMPRESSION:  1. No acute intracranial abnormality. 2. Chronic left side craniotomy with fairly advanced underlying left hemisphere chronic encephalomalacia, Wallerian degeneration. 3. Mild superimposed white matter changes in the right hemisphere are most commonly due to small vessel disease. Electronically Signed   By: H  Hall M.D.   On: 01/20/2023 09:46   ECHOCARDIOGRAM COMPLETE Result Date: 01/19/2023    ECHOCARDIOGRAM REPORT   Patient Name:   Justin Mounts Jr. Date of Exam: 01/19/2023 Medical Rec #:  [id number]           Height:       71.0 in Accession #:    [id number]          Weight:       29 9.6 lb Date of  Birth:  11-12-1960          BSA:          2.504 m Patient Age:    62 years            BP:           141/89 mmHg Patient Gender: M                   HR:           101 bpm. Exam Location:  Inpatient Procedure: 2D Echo, Color Doppler and Cardiac Doppler Indications:    abnormal ecg  History:        Patient has prior history of Echocardiogram examinations, most                 recent 07/02/2013. Chronic kidney disease; Risk Factors:Sleep                 Apnea, Hypertension, Dyslipidemia and Diabetes.  Sonographer:    Delcie Roch RDCS Referring Phys: [phone] [facility] A [place]  Sonographer Comments: Image acquisition challenging due to patient body habitus. IMPRESSIONS  1. Left ventricular ejection fraction, by estimation, is 60 to 65%. The left ventricle has normal function. The left ventricle has no regional wall motion abnormalities. There is mild left ventricular hypertrophy. Left ventricular diastolic parameters were normal.  2. Right ventricular systolic function is normal. The right ventricular size is normal.  3. Left atrial size was mildly dilated.  4. The mitral valve is normal in structure. No evidence of mitral valve regurgitation. No evidence of mitral stenosis.  5. The aortic valve is normal in structure. Aortic valve regurgitation is not visualized. No aortic stenosis is present.  6. The inferior vena  cava is normal in size with greater than 50% respiratory variability, suggesting right atrial pressure of 3 mmHg. FINDINGS  Left Ventricle: Left ventricular ejection fraction, by estimation, is 60 to 65%. The left ventricle has normal function. The left ventricle has no regional wall motion abnormalities. The left ventricular internal cavity size was normal in size. There is  mild left ventricular hypertrophy. Left ventricular diastolic parameters were normal. Right Ventricle: The right ventricular size is normal. No increase in right ventricular wall thickness. Right ventricular systolic function is normal. Left Atrium: Left atrial size was mildly dilated. Right Atrium: Right atrial size was normal in size. Pericardium: Trivial pericardial effusion is present. The pericardial effusion is posterior to the left ventricle. Mitral Valve: The mitral valve is normal in structure. No evidence of mitral valve regurgitation. No evidence of mitral valve stenosis. Tricuspid Valve: The tricuspid valve is normal in structure. Tricuspid valve regurgitation is not demonstrated. No evidence of tricuspid stenosis. Aortic Valve: The aortic valve is normal in structure. Aortic valve regurgitation is not visualized. No aortic stenosis is present. Pulmonic Valve: The pulmonic valve was normal in structure. Pulmonic valve regurgitation is not visualized. No evidence of pulmonic stenosis. Aorta: The aortic root is normal in size and structure. Venous: The inferior vena cava is normal in size with greater than 50% respiratory variability, suggesting right atrial pressure of 3 mmHg. IAS/Shunts: No atrial level shunt detected by color flow Doppler.  LEFT VENTRICLE PLAX 2D LVIDd:         5.40 cm LVIDs:         4.20 cm LV PW:         1.30 cm LV IVS:        1.20 cm LVOT diam:  2.00 cm LV SV:         46 LV SV Index:   19 LVOT Area:     3.14 cm  RIGHT VENTRICLE          IVC RV Basal diam:  3.40 cm  IVC diam: 2.10 cm LEFT ATRIUM               Index        RIGHT ATRIUM           Index LA diam:        4.70 cm  1.88 cm/m   RA Area:     18.80 cm LA Vol (A2C):   101.0 ml 40.34 ml/m  RA Volume:   48.40 ml  19.33 ml/m LA Vol (A4C):   88.9 ml  35.51 ml/m LA Biplane Vol: 97.7 ml  39.02 ml/m  AORTIC VALVE LVOT Vmax:   94.00 cm/s LVOT Vmean:  63.600 cm/s LVOT VTI:    0.148 m  AORTA Ao Root diam: 3.60 cm Ao Asc diam:  4.00 cm  SHUNTS Systemic VTI:  0.15 m Systemic Diam: 2.00 cm Donato Schultz MD Electronically signed by Donato Schultz MD Signature Date/Time: 01/19/2023/4:11:44 PM    Final    DG Tibia/Fibula Right Result Date: 01/17/2023 CLINICAL DATA:  Edema, soft tissue wounds, concern for osteomyelitis EXAM: RIGHT TIBIA AND FIBULA - 2 VIEW COMPARISON:  07/22/2016 FINDINGS: Frontal and lateral views of the right tibia and fibula are obtained. There is extensive soft tissue edema throughout the visualized right lower leg. No evidence of subcutaneous gas or radiopaque foreign body. There are no acute or destructive bony abnormalities. No radiographic evidence of osteomyelitis. 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 IMPRESSION: 1. Diffuse soft tissue edema. No subcutaneous gas, radiopaque foreign body, or radiographic evidence of osteomyelitis. Electronically Signed   By: Sharlet Salina M.D.   On: 01/17/2023 16:45   DG Chest Portable 1 View Result Date: 01/17/2023 CLINICAL DATA:  Increasing weakness and fatigue for several weeks, sepsis EXAM: PORTABLE CHEST 1 VIEW COMPARISON:  01/08/2023 FINDINGS: Single frontal view of the chest demonstrates stable enlargement of the cardiac silhouette. There is increased pulmonary vascular congestion without focal consolidation, effusion, or pneumothorax. No acute bony abnormalities. IMPRESSION: 1. Increased pulmonary vascular congestion without overt edema. 2. Stable enlarged cardiac silhouette. Electronically Signed   By: Sharlet Salina M.D.   On:  01/17/2023 16:43   US Venous Img Lower Right (DVT Study) Result Date: 01/08/2023 CLINICAL DATA:  SOB - weakness EXAM: RIGHT LOWER EXTREMITY VENOUS DOPPLER ULTRASOUND TECHNIQUE: Gray-scale sonography with compression, as well as color and duplex ultrasound, were performed to evaluate the deep venous system(s) from the level of the common femoral vein through the popliteal and proximal calf veins. COMPARISON:  Chest XR, concurrent. RIGHT lower extremity XRs 07/22/2016. FINDINGS: Suboptimal evaluation, with poor acoustic penetration secondary to patient habitus. VENOUS Normal compressibility of the common femoral, superficial femoral, and popliteal veins, as well as the visualized calf veins. Visualized portions of profunda femoral vein and great saphenous vein unremarkable. No filling defects to suggest DVT on grayscale or color Doppler imaging. Doppler waveforms show normal direction of venous flow, normal respiratory plasticity and response to augmentation. Limited views of the contralateral common femoral vein are unremarkable. OTHER No evidence of superficial thrombophlebitis or abnormal fluid collection. Subcutaneous edema within the imaged distal RIGHT lower extremity Limitations: Patient body habitus IMPRESSION: Suboptimal evaluation, within these constraints;  No evidence of femoropopliteal DVT or superficial thrombophlebitis within the RIGHT lower extremity. Roanna Banning, MD [facility] Electronically Signed   By: Roanna Banning M.D.   On: 01/08/2023 12:34   DG Chest 2 View Result Date: 01/08/2023 CLINICAL DATA:  Shortness of breath. EXAM: CHEST - 2 VIEW COMPARISON:  February 04, 2022. FINDINGS: Stable cardiomegaly. Both lungs are clear. The visualized skeletal structures are unremarkable. IMPRESSION: No active cardiopulmonary disease. Electronically Signed   By: Lupita Raider M.D.   On: 01/08/2023 11:25     Discharge Exam: Vitals:    01/25/23 0900 01/25/23 1229  BP: (!) 140/95 (!) 142/95  Pulse:  (!) 103  Resp: 20 20  Temp:  98.5 F (36.9 C)  SpO2:  96%   Vitals:   01/25/23 0033 01/25/23 0403 01/25/23 0900 01/25/23 1229  BP: (!) 146/95 (!) 141/106 (!) 140/95 (!) 142/95  Pulse: (!) 103 87  (!) 103  Resp:  19 20 20   Temp:  98.3 F (36.8 C)  98.5 F (36.9 C)  TempSrc:  Oral  Oral  SpO2:  94%  96%  Weight:      Height:        General: Pt is alert, awake, not in acute distress Cardiovascular: RRR, S1/S2 +, no rubs, no gallops Respiratory: CTA bilaterally, no wheezing, no rhonchi Abdominal: Soft, NT, ND, bowel sounds + Extremities: no edema, no cyanosis Neuro: Dysarthria and right hemiparesis from previous stroke.    The results of significant diagnostics from this hospitalization (including imaging, microbiology, ancillary and laboratory) are listed below for reference.     Microbiology: Recent Results (from the past 240 hours)  Blood culture (routine x 2)     Status: None   Collection Time: 01/17/23  3:27 PM   Specimen: BLOOD  Result Value Ref Range Status   Specimen Description   Final    BLOOD LEFT ANTECUBITAL Performed at [facility], [address] [place], [place] [address]    Special Requests   Final    BOTTLES DRAWN AEROBIC AND ANAEROBIC Blood Culture results may not be optimal due to an inadequate volume of blood received in culture bottles Performed at [facility], [address] [place], [place] [address]    Culture   Final    NO GROWTH 5 DAYS Performed at [facility], [address] [place], [place] [address]    Report Status 01/22/2023 FINAL  Final  Resp panel by RT-PCR (RSV, Flu A&B, Covid) Anterior Nasal Swab     Status: None   Collection Time: 01/17/23  3:28 PM   Specimen: Anterior Nasal Swab  Result Value Ref Range Status   SARS Coronavirus 2 by RT PCR NEGATIVE NEGATIVE Final    Comment: (NOTE) SARS-CoV-2 target nucleic acids are NOT  DETECTED.  The SARS-CoV-2 RNA is generally detectable in upper respiratory specimens during the acute phase of infection. The lowest concentration of SARS-CoV-2 viral copies this assay can detect is 138 copies/mL. A negative result does not preclude SARS-Cov-2 infection and should not be used as the sole basis for treatment or other patient management decisions. A negative result may occur with  improper specimen collection/handling, submission of specimen other than nasopharyngeal swab, presence of viral mutation(s) within the areas targeted by this assay, and inadequate number of viral copies(<138 copies/mL). A negative result must be combined with clinical observations, patient history, and epidemiological information. The expected result is  Negative.  Fact Sheet for Patients:  BloggerCourse.com  Fact Sheet for Healthcare Providers:  SeriousBroker.it  This test is no t yet approved or cleared by the [place] FDA and  has been authorized for detection and/or diagnosis of SARS-CoV-2 by FDA under an Emergency Use Authorization (EUA). This EUA will remain  in effect (meaning this test can be used) for the duration of the COVID-19 declaration under Section 564(b)(1) of the Act, 21 U.S.C.section 360bbb-3(b)(1), unless the authorization is terminated  or revoked sooner.       Influenza A by PCR NEGATIVE NEGATIVE Final   Influenza B by PCR NEGATIVE NEGATIVE Final    Comment: (NOTE) The Xpert Xpress SARS-CoV-2/FLU/RSV plus assay is intended as an aid in the diagnosis of influenza from Nasopharyngeal swab specimens and should not be used as a sole basis for treatment. Nasal washings and aspirates are unacceptable for Xpert Xpress SARS-CoV-2/FLU/RSV testing.  Fact Sheet for Patients: BloggerCourse.com  Fact Sheet for Healthcare Providers: SeriousBroker.it  This test is not yet  approved or cleared by the [place] FDA and has been authorized for detection and/or diagnosis of SARS-CoV-2 by FDA under an Emergency Use Authorization (EUA). This EUA will remain in effect (meaning this test can be used) for the duration of the COVID-19 declaration under Section 564(b)(1) of the Act, 21 U.S.C. section 360bbb-3(b)(1), unless the authorization is terminated or revoked.     Resp Syncytial Virus by PCR NEGATIVE NEGATIVE Final    Comment: (NOTE) Fact Sheet for Patients: BloggerCourse.com  Fact Sheet for Healthcare Providers: SeriousBroker.it  This test is not yet approved or cleared by the [place] FDA and has been authorized for detection and/or diagnosis of SARS-CoV-2 by FDA under an Emergency Use Authorization (EUA). This EUA will remain in effect (meaning this test can be used) for the duration of the COVID-19 declaration under Section 564(b)(1) of the Act, 21 U.S.C. section 360bbb-3(b)(1), unless the authorization is terminated or revoked.  Performed at [facility], [address] [place], [place] [address]      Labs: BNP (last 3 results) Recent Labs    01/08/23 1031  BNP 326.3*   Basic Metabolic Panel: Recent Labs  Lab 01/19/23 0509 01/20/23 0911 01/23/23 0523 01/25/23 0513  NA 137 138 139 138  K 3.1* 3.6 3.4* 3.3*  CL 105 107 105 104  CO2 25 25 25 28   GLUCOSE 93 151* 89 96  BUN 16 18 21  25*  CREATININE 1.66* 1.53* 1.80* 1.95*  CALCIUM 7.3* 7.7* 7.7* 7.9*  MG  --   --  2.3  --    Liver Function Tests: No results for input(s): "AST", "ALT", "ALKPHOS", "BILITOT", "PROT", "ALBUMIN" in the last 168 hours. No results for input(s): "LIPASE", "AMYLASE" in the last 168 hours. No results for input(s): "AMMONIA" in the last 168 hours. CBC: Recent Labs  Lab 01/19/23 0509 01/20/23 0911 01/23/23 0523  WBC 9.0 8.1 7.7  HGB 11.5* 11.6* 11.3*  HCT 34.7* 34.7* 35.3*  MCV 83.6  83.4 85.3  PLT 286 292 308   Cardiac Enzymes: No results for input(s): "CKTOTAL", "CKMB", "CKMBINDEX", "TROPONINI" in the last 168 hours. BNP: Invalid input(s): "POCBNP" CBG: Recent Labs  Lab 01/24/23 1102 01/24/23 1620 01/24/23 2043 01/25/23 0731 01/25/23 1123  GLUCAP 117* 100* 121* 94 124*   D-Dimer No results for input(s): "DDIMER" in the last 72 hours. Hgb A1c No results for input(s): "HGBA1C" in the last 72 hours. Lipid Profile No results for input(s): "  CHOL", "HDL", "LDLCALC", "TRIG", "CHOLHDL", "LDLDIRECT" in the last 72 hours. Thyroid function studies No results for input(s): "TSH", "T4TOTAL", "T3FREE", "THYROIDAB" in the last 72 hours.  Invalid input(s): "FREET3" Anemia work up No results for input(s): "VITAMINB12", "FOLATE", "FERRITIN", "TIBC", "IRON", "RETICCTPCT" in the last 72 hours. Urinalysis    Component Value Date/Time   COLORURINE YELLOW 01/17/2023 1527   APPEARANCEUR CLEAR 01/17/2023 1527   LABSPEC 1.010 01/17/2023 1527   PHURINE 6.0 01/17/2023 1527   GLUCOSEU >=500 (A) 01/17/2023 1527   HGBUR LARGE (A) 01/17/2023 1527   BILIRUBINUR NEGATIVE 01/17/2023 1527   BILIRUBINUR neg 08/12/2014 1805   KETONESUR NEGATIVE 01/17/2023 1527   PROTEINUR 100 (A) 01/17/2023 1527   UROBILINOGEN 0.2 08/12/2014 1805   NITRITE NEGATIVE 01/17/2023 1527   LEUKOCYTESUR NEGATIVE 01/17/2023 1527   Sepsis Labs Recent Labs  Lab 01/19/23 0509 01/20/23 0911 01/23/23 0523  WBC 9.0 8.1 7.7   Microbiology Recent Results (from the past 240 hours)  Blood culture (routine x 2)     Status: None   Collection Time: 01/17/23  3:27 PM   Specimen: BLOOD  Result Value Ref Range Status   Specimen Description   Final    BLOOD LEFT ANTECUBITAL Performed at [facility], [address] [place], [place] [address]    Special Requests   Final    BOTTLES DRAWN AEROBIC AND ANAEROBIC Blood Culture results may not be optimal due to an inadequate volume of blood received in  culture bottles Performed at [facility], [address] [place], [place] [address]    Culture   Final    NO GROWTH 5 DAYS Performed at [facility], [address] [place], [place] [address]    Report Status 01/22/2023 FINAL  Final  Resp panel by RT-PCR (RSV, Flu A&B, Covid) Anterior Nasal Swab     Status: None   Collection Time: 01/17/23  3:28 PM   Specimen: Anterior Nasal Swab  Result Value Ref Range Status   SARS Coronavirus 2 by RT PCR NEGATIVE NEGATIVE Final    Comment: (NOTE) SARS-CoV-2 target nucleic acids are NOT DETECTED.  The SARS-CoV-2 RNA is generally detectable in upper respiratory specimens during the acute phase of infection. The lowest concentration of SARS-CoV-2 viral copies this assay can detect is 138 copies/mL. A negative result does not preclude SARS-Cov-2 infection and should not be used as the sole basis for treatment or other patient management decisions. A negative result may occur with  improper specimen collection/handling, submission of specimen other than nasopharyngeal swab, presence of viral mutation(s) within the areas targeted by this assay, and inadequate number of viral copies(<138 copies/mL). A negative result must be combined with clinical observations, patient history, and epidemiological information. The expected result is Negative.  Fact Sheet for Patients:  BloggerCourse.com  Fact Sheet for Healthcare Providers:  SeriousBroker.it  This test is no t yet approved or cleared by the [place] FDA and  has been authorized for detection and/or diagnosis of SARS-CoV-2 by FDA under an Emergency Use Authorization (EUA). This EUA will remain  in effect (meaning this test can be used) for the duration of the COVID-19 declaration under Section 564(b)(1) of the Act, 21 U.S.C.section 360bbb-3(b)(1), unless the authorization is terminated  or revoked sooner.       Influenza  A by PCR NEGATIVE NEGATIVE Final   Influenza B by PCR NEGATIVE NEGATIVE Final    Comment: (NOTE) The Xpert Xpress SARS-CoV-2/FLU/RSV plus assay is  intended as an aid in the diagnosis of influenza from Nasopharyngeal swab specimens and should not be used as a sole basis for treatment. Nasal washings and aspirates are unacceptable for Xpert Xpress SARS-CoV-2/FLU/RSV testing.  Fact Sheet for Patients: BloggerCourse.com  Fact Sheet for Healthcare Providers: SeriousBroker.it  This test is not yet approved or cleared by the [place] FDA and has been authorized for detection and/or diagnosis of SARS-CoV-2 by FDA under an Emergency Use Authorization (EUA). This EUA will remain in effect (meaning this test can be used) for the duration of the COVID-19 declaration under Section 564(b)(1) of the Act, 21 U.S.C. section 360bbb-3(b)(1), unless the authorization is terminated or revoked.     Resp Syncytial Virus by PCR NEGATIVE NEGATIVE Final    Comment: (NOTE) Fact Sheet for Patients: BloggerCourse.com  Fact Sheet for Healthcare Providers: SeriousBroker.it  This test is not yet approved or cleared by the [place] FDA and has been authorized for detection and/or diagnosis of SARS-CoV-2 by FDA under an Emergency Use Authorization (EUA). This EUA will remain in effect (meaning this test can be used) for the duration of the COVID-19 declaration under Section 564(b)(1) of the Act, 21 U.S.C. section 360bbb-3(b)(1), unless the authorization is terminated or revoked.  Performed at [facility], [address] [place], [place] [address]     FURTHER DISCHARGE INSTRUCTIONS:   Get Medicines reviewed and adjusted: Please take all your medications with you for your next visit with your Primary MD   Laboratory/radiological data: Please request your Primary MD to go over all  hospital tests and procedure/radiological results at the follow up, please ask your Primary MD to get all Hospital records sent to his/her office.   In some cases, they will be blood work, cultures and biopsy results pending at the time of your discharge. Please request that your primary care M.D. goes through all the records of your hospital data and follows up on these results.   Also Note the following: If you experience worsening of your admission symptoms, develop shortness of breath, life threatening emergency, suicidal or homicidal thoughts you must seek medical attention immediately by calling 911 or calling your MD immediately  if symptoms less severe.   You must read complete instructions/literature along with all the possible adverse reactions/side effects for all the Medicines you take and that have been prescribed to you. Take any new Medicines after you have completely understood and accpet all the possible adverse reactions/side effects.    Do not drive when taking Pain medications or sleeping medications (Benzodaizepines)   Do not take more than prescribed Pain, Sleep and Anxiety Medications. It is not advisable to combine anxiety,sleep and pain medications without talking with your primary care practitioner   Special Instructions: If you have smoked or chewed Tobacco  in the last 2 yrs please stop smoking, stop any regular Alcohol  and or any Recreational drug use.   Wear Seat belts while driving.   Please note: You were cared for by a hospitalist during your hospital stay. Once you are discharged, your primary care physician will handle any further medical issues. Please note that NO REFILLS for any discharge medications will be authorized once you are discharged, as it is imperative that you return to your primary care physician (or establish a relationship with a primary care physician if you do not have one) for your post hospital discharge needs so that they can reassess your  need for medications and monitor your lab  values  Time coordinating discharge: Over 30 minutes  SIGNED:   Hughie Closs, MD  [facility] 01/25/2023, 12:35 PM *Please note that this is a verbal dictation therefore any spelling or grammatical errors are due to the "Dragon Medical One" system interpretation. If 7PM-7AM, please contact night-coverage [email or web address]

## 2023-01-25 NOTE — TOC Progression Note (Addendum)
Transition of Care (TOC) - Progression Note    Patient Details  Name: Justin Molina. MRN: [id number] Date of Birth: 1961/01/22  Transition of Care [facility]) CM/SW Contact  Harl Wiechmann, Olegario Messier, RN Phone Number: 01/25/2023, 11:40 AM  Clinical Narrative: Awaiting auth currently pending for [facility] rep Lowella Bandy aware.  -12:2p received auth awaiting d/c summary to send to [employer] for d/c. Await rm#,report# prior PTAR once d/c summary sent.PTAR forms in printer for packet.MD updated. -1p d/c summary sent to [facility] rep [place] await rm#,report tel# prior PTAR.      Expected Discharge Plan: Skilled Nursing Facility Barriers to Discharge: Insurance Authorization  Expected Discharge Plan and Services                                               Social Determinants of Health (SDOH) Interventions SDOH Screenings   Food Insecurity: No Food Insecurity (01/17/2023)  Housing: Low Risk  (01/18/2023)  Transportation Needs: No Transportation Needs (01/17/2023)  Utilities: Not At Risk (01/17/2023)  Tobacco Use: Low Risk  (01/17/2023)    Readmission Risk Interventions     No data to display

## 2023-01-25 NOTE — Progress Notes (Signed)
Physical Therapy Treatment Patient Details Name: Justin Molina. MRN: [id number] DOB: 1960/12/02 Today's Date: 01/25/2023   History of Present Illness Justin Molina is a 62 yr old male admitted to the hospital 01-17-23 with fevers, R LE wound, and R LE edema. Pt found to have sepsis due to R LE wound and cellulitis. PMH: CVA with R hemiparesis and aphasia, DM II, CHF, OSA, HTN, brain surgery, hernia repair    PT Comments  Pt in recliner on arrival and agreeable to ambulate.  Pt ambulated in hallway and prefers to attempt mobility without physical assist although CGA provided for safety. Pt returned to bed and repositioned with pillows end of session.     If plan is discharge home, recommend the following: A little help with bathing/dressing/bathroom;Assistance with cooking/housework;Assist for transportation;Help with stairs or ramp for entrance;A little help with walking and/or transfers   Can travel by private vehicle        Equipment Recommendations  None recommended by PT    Recommendations for Other Services       Precautions / Restrictions Precautions Precautions: Fall Precaution Comments: R hemiparesis 2* prior CVA     Mobility  Bed Mobility Overal bed mobility: Needs Assistance Bed Mobility: Sit to Supine       Sit to supine: Min assist, HOB elevated, Used rails   General bed mobility comments: Increased time, use of bedrails; assist for RLE back onto bed.    Transfers Overall transfer level: Needs assistance Equipment used: Rolling walker (2 wheels) Transfers: Sit to/from Stand Sit to Stand: Contact guard assist           General transfer comment: increased time and effort, pt declined assist and prefers to mobilize on his own (CGA for safety)    Ambulation/Gait Ambulation/Gait assistance: Contact guard assist Gait Distance (Feet): 80 Feet Assistive device: Rolling walker (2 wheels) Gait Pattern/deviations: Step-to pattern, Trunk flexed, Knee  hyperextension - right, Decreased stride length Gait velocity: decr     General Gait Details: observed circumduction of R LE likely to assist with clearance in swing as well as R knee hyperextension in stance; pt declined physical assist however provided with CGA for safety with gait belt; pt preferred using RW and only pushing on Lt; increased time   Optometrist     Tilt Bed    Modified Rankin (Stroke Patients Only)       Balance                                            Cognition Arousal: Alert Behavior During Therapy: WFL for tasks assessed/performed Overall Cognitive Status: Difficult to assess                                 General Comments: h/o expressive aphasia 2* CVA, follows commands well, answers yes/no questions with increased time, uses gestures and a few words but often requires increased time to get correct word out.        Exercises      General Comments        Pertinent Vitals/Pain Pain Assessment Pain Assessment: No/denies pain Pain Intervention(s): Monitored during session, Repositioned    Home Living  Prior Function            PT Goals (current goals can now be found in the care plan section) Progress towards PT goals: Progressing toward goals    Frequency    Min 1X/week      PT Plan      Co-evaluation              AM-PAC PT "6 Clicks" Mobility   Outcome Measure  Help needed turning from your back to your side while in a flat bed without using bedrails?: A Little Help needed moving from lying on your back to sitting on the side of a flat bed without using bedrails?: A Little Help needed moving to and from a bed to a chair (including a wheelchair)?: A Little Help needed standing up from a chair using your arms (e.g., wheelchair or bedside chair)?: A Little Help needed to walk in hospital room?: A Little Help needed  climbing 3-5 steps with a railing? : A Lot 6 Click Score: 17    End of Session Equipment Utilized During Treatment: Gait belt Activity Tolerance: Patient tolerated treatment well Patient left: in bed;with call bell/phone within reach;with bed alarm set   PT Visit Diagnosis: Difficulty in walking, not elsewhere classified (R26.2)     Time: 6644-0347 PT Time Calculation (min) (ACUTE ONLY): 21 min  Charges:    $Gait Training: 8-22 mins PT General Charges $$ ACUTE PT VISIT: 1 Visit          Paulino Door, DPT Physical Therapist Acute Rehabilitation Services Office: [phone]    Janan Halter P[place] 01/25/2023, 2:57 PM

## 2023-01-25 NOTE — Plan of Care (Signed)
  Problem: Health Behavior/Discharge Planning: Goal: Ability to manage health-related needs will improve Outcome: Progressing   Problem: Clinical Measurements: Goal: Will remain free from infection Outcome: Progressing Goal: Diagnostic test results will improve Outcome: Progressing Goal: Respiratory complications will improve Outcome: Progressing   

## 2023-02-26 ENCOUNTER — Ambulatory Visit: Payer: Medicare PPO | Admitting: Podiatry

## 2023-02-27 ENCOUNTER — Ambulatory Visit (INDEPENDENT_AMBULATORY_CARE_PROVIDER_SITE_OTHER): Payer: Medicare PPO | Admitting: Podiatry

## 2023-02-27 ENCOUNTER — Encounter: Payer: Self-pay | Admitting: Podiatry

## 2023-02-27 VITALS — Ht 71.0 in | Wt 299.0 lb

## 2023-02-27 DIAGNOSIS — M79674 Pain in right toe(s): Secondary | ICD-10-CM

## 2023-02-27 DIAGNOSIS — E1140 Type 2 diabetes mellitus with diabetic neuropathy, unspecified: Secondary | ICD-10-CM | POA: Diagnosis not present

## 2023-02-27 DIAGNOSIS — M79675 Pain in left toe(s): Secondary | ICD-10-CM | POA: Diagnosis not present

## 2023-02-27 DIAGNOSIS — M2141 Flat foot [pes planus] (acquired), right foot: Secondary | ICD-10-CM

## 2023-02-27 DIAGNOSIS — B351 Tinea unguium: Secondary | ICD-10-CM | POA: Diagnosis not present

## 2023-02-27 DIAGNOSIS — Z794 Long term (current) use of insulin: Secondary | ICD-10-CM

## 2023-02-27 DIAGNOSIS — M2142 Flat foot [pes planus] (acquired), left foot: Secondary | ICD-10-CM

## 2023-02-27 NOTE — Progress Notes (Signed)
This patient returns to my office for at risk foot care.  This patient requires this care by a professional since this patient will be at risk due to having diabetes.He presents to the office with his wife of 38 years.  This patient is unable to cut nails himself since the patient cannot reach his nails.These nails are painful walking and wearing shoes.  This patient presents for at risk foot care today.  General Appearance  Alert, conversant and in no acute stress.  Vascular  Dorsalis pedis and posterior tibial  pulses are palpable  bilaterally.  Capillary return is within normal limits  bilaterally. Temperature is within normal limits  bilaterally.  Neurologic  Senn-Weinstein monofilament wire test  diminished bilaterally. Muscle power within normal limits bilaterally.  Nails Thick disfigured discolored nails with subungual debris  from hallux to fifth toes bilaterally. No evidence of bacterial infection or drainage bilaterally.  Orthopedic  No limitations of motion  feet .  No crepitus or effusions noted.  No bony pathology or digital deformities noted. Foot drop.  Skin  normotropic skin with no porokeratosis noted bilaterally.  No signs of infections or ulcers noted.     Onychomycosis  Pain in right toes  Pain in left toes  Foot drop.  Consent was obtained for treatment procedures.   Mechanical debridement of nails 1-5  bilaterally performed with a nail nipper.  Filed with dremel without incident. Patient requests and has been given a prescription of diabetic shoes with AFO   Return office visit     3 months                 Told patient to return for periodic foot care and evaluation due to potential at risk complications.   Helane Gunther DPM

## 2023-03-18 ENCOUNTER — Observation Stay (HOSPITAL_BASED_OUTPATIENT_CLINIC_OR_DEPARTMENT_OTHER)
Admission: EM | Admit: 2023-03-18 | Discharge: 2023-03-21 | Disposition: A | Discharge status: Home / Self Care | Payer: Medicare PPO | Attending: Internal Medicine | Admitting: Internal Medicine

## 2023-03-18 ENCOUNTER — Other Ambulatory Visit: Payer: Self-pay

## 2023-03-18 ENCOUNTER — Emergency Department (HOSPITAL_BASED_OUTPATIENT_CLINIC_OR_DEPARTMENT_OTHER): Payer: Medicare PPO

## 2023-03-18 DIAGNOSIS — N400 Enlarged prostate without lower urinary tract symptoms: Secondary | ICD-10-CM | POA: Diagnosis present

## 2023-03-18 DIAGNOSIS — S81801A Unspecified open wound, right lower leg, initial encounter: Secondary | ICD-10-CM | POA: Diagnosis present

## 2023-03-18 DIAGNOSIS — Z1152 Encounter for screening for COVID-19: Secondary | ICD-10-CM | POA: Insufficient documentation

## 2023-03-18 DIAGNOSIS — E1129 Type 2 diabetes mellitus with other diabetic kidney complication: Secondary | ICD-10-CM | POA: Diagnosis present

## 2023-03-18 DIAGNOSIS — G4733 Obstructive sleep apnea (adult) (pediatric): Secondary | ICD-10-CM | POA: Diagnosis not present

## 2023-03-18 DIAGNOSIS — Z7984 Long term (current) use of oral hypoglycemic drugs: Secondary | ICD-10-CM | POA: Insufficient documentation

## 2023-03-18 DIAGNOSIS — Z79899 Other long term (current) drug therapy: Secondary | ICD-10-CM | POA: Insufficient documentation

## 2023-03-18 DIAGNOSIS — K5900 Constipation, unspecified: Secondary | ICD-10-CM | POA: Diagnosis present

## 2023-03-18 DIAGNOSIS — Z7901 Long term (current) use of anticoagulants: Secondary | ICD-10-CM | POA: Insufficient documentation

## 2023-03-18 DIAGNOSIS — D500 Iron deficiency anemia secondary to blood loss (chronic): Secondary | ICD-10-CM | POA: Insufficient documentation

## 2023-03-18 DIAGNOSIS — E1122 Type 2 diabetes mellitus with diabetic chronic kidney disease: Secondary | ICD-10-CM | POA: Diagnosis present

## 2023-03-18 DIAGNOSIS — I509 Heart failure, unspecified: Principal | ICD-10-CM

## 2023-03-18 DIAGNOSIS — E669 Obesity, unspecified: Secondary | ICD-10-CM | POA: Diagnosis not present

## 2023-03-18 DIAGNOSIS — I5033 Acute on chronic diastolic (congestive) heart failure: Secondary | ICD-10-CM

## 2023-03-18 DIAGNOSIS — I482 Chronic atrial fibrillation, unspecified: Secondary | ICD-10-CM | POA: Diagnosis present

## 2023-03-18 DIAGNOSIS — E876 Hypokalemia: Secondary | ICD-10-CM | POA: Diagnosis present

## 2023-03-18 DIAGNOSIS — N183 Chronic kidney disease, stage 3 unspecified: Secondary | ICD-10-CM | POA: Diagnosis present

## 2023-03-18 DIAGNOSIS — G43909 Migraine, unspecified, not intractable, without status migrainosus: Secondary | ICD-10-CM | POA: Diagnosis not present

## 2023-03-18 DIAGNOSIS — E785 Hyperlipidemia, unspecified: Secondary | ICD-10-CM | POA: Diagnosis present

## 2023-03-18 DIAGNOSIS — W19XXXA Unspecified fall, initial encounter: Secondary | ICD-10-CM | POA: Insufficient documentation

## 2023-03-18 DIAGNOSIS — Z794 Long term (current) use of insulin: Secondary | ICD-10-CM | POA: Insufficient documentation

## 2023-03-18 DIAGNOSIS — I2510 Atherosclerotic heart disease of native coronary artery without angina pectoris: Secondary | ICD-10-CM | POA: Diagnosis present

## 2023-03-18 DIAGNOSIS — Z8673 Personal history of transient ischemic attack (TIA), and cerebral infarction without residual deficits: Secondary | ICD-10-CM | POA: Diagnosis not present

## 2023-03-18 DIAGNOSIS — E66812 Obesity, class 2: Secondary | ICD-10-CM | POA: Diagnosis present

## 2023-03-18 DIAGNOSIS — I10 Essential (primary) hypertension: Secondary | ICD-10-CM | POA: Diagnosis present

## 2023-03-18 DIAGNOSIS — E1140 Type 2 diabetes mellitus with diabetic neuropathy, unspecified: Secondary | ICD-10-CM | POA: Diagnosis not present

## 2023-03-18 DIAGNOSIS — N1832 Chronic kidney disease, stage 3b: Secondary | ICD-10-CM | POA: Diagnosis not present

## 2023-03-18 DIAGNOSIS — I130 Hypertensive heart and chronic kidney disease with heart failure and stage 1 through stage 4 chronic kidney disease, or unspecified chronic kidney disease: Secondary | ICD-10-CM | POA: Insufficient documentation

## 2023-03-18 DIAGNOSIS — Z6836 Body mass index (BMI) 36.0-36.9, adult: Secondary | ICD-10-CM | POA: Insufficient documentation

## 2023-03-18 DIAGNOSIS — R0602 Shortness of breath: Secondary | ICD-10-CM | POA: Diagnosis present

## 2023-03-18 DIAGNOSIS — I6930 Unspecified sequelae of cerebral infarction: Secondary | ICD-10-CM

## 2023-03-18 DIAGNOSIS — I4891 Unspecified atrial fibrillation: Secondary | ICD-10-CM | POA: Diagnosis not present

## 2023-03-18 HISTORY — DX: Acute on chronic diastolic (congestive) heart failure: I50.33

## 2023-03-18 LAB — BASIC METABOLIC PANEL
Anion gap: 9 (ref 5–15)
BUN: 33 mg/dL — ABNORMAL HIGH (ref 8–23)
CO2: 23 mmol/L (ref 22–32)
Calcium: 8.2 mg/dL — ABNORMAL LOW (ref 8.9–10.3)
Chloride: 109 mmol/L (ref 98–111)
Creatinine, Ser: 2.04 mg/dL — ABNORMAL HIGH (ref 0.61–1.24)
GFR, Estimated: 36 mL/min — ABNORMAL LOW (ref >60)
Glucose, Bld: 90 mg/dL (ref 70–99)
Potassium: 3.5 mmol/L (ref 3.5–5.1)
Sodium: 141 mmol/L (ref 135–145)

## 2023-03-18 LAB — CBC
HCT: 34.9 % — ABNORMAL LOW (ref 39.0–52.0)
Hemoglobin: 11.2 g/dL — ABNORMAL LOW (ref 13.0–17.0)
MCH: 26.2 pg (ref 26.0–34.0)
MCHC: 32.1 g/dL (ref 30.0–36.0)
MCV: 81.5 fL (ref 80.0–100.0)
Platelets: 196 10*3/uL (ref 150–400)
RBC: 4.28 MIL/uL (ref 4.22–5.81)
RDW: 17.2 % — ABNORMAL HIGH (ref 11.5–15.5)
WBC: 5.3 10*3/uL (ref 4.0–10.5)
nRBC: 0 % (ref 0.0–0.2)

## 2023-03-18 LAB — RESP PANEL BY RT-PCR (RSV, FLU A&B, COVID)  RVPGX2
Influenza A by PCR: NEGATIVE
Influenza B by PCR: NEGATIVE
Resp Syncytial Virus by PCR: NEGATIVE
SARS Coronavirus 2 by RT PCR: NEGATIVE

## 2023-03-18 LAB — BRAIN NATRIURETIC PEPTIDE: B Natriuretic Peptide: 582.3 pg/mL — ABNORMAL HIGH (ref 0.0–100.0)

## 2023-03-18 MED ORDER — APIXABAN 5 MG PO TABS
5.0000 mg | ORAL_TABLET | Freq: Two times a day (BID) | ORAL | Status: DC
  Administered 2023-03-19 – 2023-03-21 (×6): 5 mg via ORAL
  Filled 2023-03-18: qty 2
  Filled 2023-03-18 (×4): qty 1
  Filled 2023-03-18: qty 2

## 2023-03-18 MED ORDER — ALBUTEROL SULFATE HFA 108 (90 BASE) MCG/ACT IN AERS
INHALATION_SPRAY | RESPIRATORY_TRACT | Status: AC
  Filled 2023-03-18: qty 6.7

## 2023-03-18 MED ORDER — CARVEDILOL 25 MG PO TABS
25.0000 mg | ORAL_TABLET | Freq: Two times a day (BID) | ORAL | Status: DC
  Administered 2023-03-19 – 2023-03-21 (×6): 25 mg via ORAL
  Filled 2023-03-18: qty 2
  Filled 2023-03-18 (×4): qty 1
  Filled 2023-03-18: qty 2

## 2023-03-18 MED ORDER — ALBUTEROL SULFATE HFA 108 (90 BASE) MCG/ACT IN AERS
2.0000 | INHALATION_SPRAY | RESPIRATORY_TRACT | Status: DC | PRN
  Administered 2023-03-18 – 2023-03-19 (×2): 2 via RESPIRATORY_TRACT
  Filled 2023-03-18: qty 6.7

## 2023-03-18 MED ORDER — CARVEDILOL 12.5 MG PO TABS: 25.0000 mg | ORAL_TABLET | Freq: Two times a day (BID) | ORAL | Status: DC

## 2023-03-18 MED ORDER — ISOSORBIDE MONONITRATE ER 60 MG PO TB24
120.0000 mg | ORAL_TABLET | Freq: Every day | ORAL | Status: DC
  Administered 2023-03-19 – 2023-03-21 (×3): 120 mg via ORAL
  Filled 2023-03-18 (×3): qty 2

## 2023-03-18 MED ORDER — FUROSEMIDE 10 MG/ML IJ SOLN
40.0000 mg | Freq: Once | INTRAMUSCULAR | Status: AC
  Administered 2023-03-18: 40 mg via INTRAVENOUS
  Filled 2023-03-18: qty 4

## 2023-03-18 NOTE — ED Notes (Signed)
 Frozen dinner/drink provided to pt and spouse

## 2023-03-18 NOTE — ED Triage Notes (Signed)
 PtPOV in wheelchair-   Aphasia from previous stroke 16 years ago.  Pts wife reports pt with difficulty breathing since Saturday.  Denies emesis, fever. Recently dx from hospital in January. RT to triage to assess.

## 2023-03-18 NOTE — Progress Notes (Addendum)
 Plan of Care Note for accepted transfer   Patient: Justin Molina. MRN: [id number]   DOA: 03/18/2023  Facility requesting transfer: [facility]   Requesting Provider: Dr. Nolon Baxter   Reason for transfer: Acute CHF   Facility course: 63 year old gentleman with HTN, DM, CKD 3B, CAD, CVA, and atrial fibrillation on Eliquis  presents with 3 days of shortness of breath, increased edema, and orthopnea.   He is tachypneic with RR in the low to mid 20s and is saturating mid 90s on room air.  Chest x-ray notable for stable cardiomegaly and chronic vascular congestion.  BNP is increased to 582.   Patient was treated with albuterol  and IV Lasix  in the ED.    Plan of care: The patient is accepted for admission to Telemetry unit, at [facility].   Author: Walton Guppy, MD 03/18/2023  Check [email or web address] for on-call coverage.  Nursing staff, Please call [facility] Admits & Consults System-Wide number on Amion as soon as patient's arrival, so appropriate admitting provider can evaluate the pt.

## 2023-03-18 NOTE — ED Provider Notes (Signed)
 [place] [facility] Provider Note   CSN: [id number] Arrival date & time: 03/18/23  1529     History  Chief Complaint  Patient presents with   Shortness of Breath    Justin Ming. is a 63 y.o. male.   Shortness of Breath 63 year old male history of CKD, diabetes, hypertension, prior stroke with residual aphasia, atrial fibrillation on Eliquis  presenting for shortness of breath.  Patient has had shortness of breath on Saturday night.'s been gradually progressive.  He has orthopnea and dyspnea on exertion.  No chest pain.  He has a right lower extremity wound which she saw wound care today.  They were happy with the healing of the wound.  However he send bilateral lower extremity edema is been worsening over the last couple days as well.  He is urinating normally.  No chest pain.  No abdominal pain.  He did have some wheezing but is not had any improvement with the breathing treatments he has received.     Home Medications Prior to Admission medications   Medication Sig Start Date End Date Taking? Authorizing Provider  acetaminophen  (TYLENOL ) 500 MG tablet Take 1 tablet (500 mg total) by mouth every 6 (six) hours as needed. 12/26/14   Pickering, Vrinda, NP  albuterol  (VENTOLIN  HFA) 108 (90 Base) MCG/ACT inhaler Inhale 2 puffs into the lungs every 4 (four) hours as needed for wheezing or shortness of breath. 09/24/22   [provider]  amLODipine  (NORVASC ) 5 MG tablet Take 1 tablet (5 mg total) by mouth daily. 11/17/21   Gretel Leaven, PA-C  atorvastatin  (LIPITOR ) 80 MG tablet Take 1 tablet (80 mg total) by mouth daily. 11/17/21   Gretel Leaven, PA-C  blood glucose meter kit and supplies KIT Dispense based on patient and insurance preference. Use up to four times daily as directed. 11/17/21   Gretel Leaven, PA-C  Blood Glucose Monitoring Suppl (ACCU-CHEK AVIVA PLUS) W/DEVICE KIT Check blood sugar TID  & QHS 11/17/14   Pleas Brill, NP   Blood Pressure Monitoring (BLOOD PRESSURE CUFF) MISC 1 Units by Does not apply route daily. 11/17/21   Gretel Leaven, PA-C  carvedilol  (COREG ) 25 MG tablet Take 1 tablet (25 mg total) by mouth 2 (two) times daily with a meal. Patient needs office visit for more refills 11/17/21   Gretel Leaven, PA-C  doxazosin  (CARDURA ) 8 MG tablet Take 1 tablet (8 mg total) by mouth daily. 11/17/21   Gretel Leaven, PA-C  ELIQUIS  5 MG TABS tablet Take 1 tablet (5 mg total) by mouth 2 (two) times daily. 11/17/21   Gretel Leaven, PA-C  fluticasone (FLONASE) 50 MCG/ACT nasal spray Place 2 sprays into both nostrils daily as needed for allergies or rhinitis. 09/24/22   [provider]  furosemide  (LASIX ) 40 MG tablet Take 40 mg by mouth daily. 12/25/22   [provider]  glucose blood (ACCU-CHEK AVIVA PLUS) test strip Use as instructed for TID and QHS blood glucose testing 05/23/15   Jegede, Olugbemiga E, MD  hydrALAZINE  (APRESOLINE ) 50 MG tablet Take 3 tablets (150 mg total) by mouth 3 (three) times daily. 01/25/23 02/24/23  Modena Andes, MD  isosorbide  mononitrate (IMDUR ) 120 MG 24 hr tablet Take 1 tablet (120 mg total) by mouth daily. 11/17/21   Gretel Leaven, PA-C  JARDIANCE  10 MG TABS tablet Take 10 mg by mouth daily. 12/25/22   [provider]  Lancets 28G MISC Check blood sugar TID & QHS 05/23/15   Jegede,  Olugbemiga E, MD  LANTUS SOLOSTAR 100 UNIT/ML Solostar Pen Inject 15 Units into the skin at bedtime. 01/14/23   [provider]  lisinopril  (ZESTRIL ) 20 MG tablet Take 1 tablet (20 mg total) by mouth daily. 01/26/23 02/25/23  Modena Andes, MD  loratadine  (CLARITIN ) 10 MG tablet Take 10 mg by mouth daily as needed. 11/06/22   [provider]  Misc. Devices (BATHTUB SAFETY RAIL) MISC Patient needs safety rails for shower 01/04/15   Montgomery Apgar A, NP  MULTAQ  400 MG tablet Take 1 tablet (400 mg total) by mouth 2 (two) times daily. 11/17/21   Gretel Leaven, PA-C  OZEMPIC, 0.25 OR  0.5 MG/DOSE, 2 MG/3ML SOPN Inject 0.5 mg into the skin once a week. Every Sunday 12/27/22   [provider]  potassium chloride  (KLOR-CON  M) 10 MEQ tablet Take 1 tablet (10 mEq total) by mouth daily. 11/17/21   Gretel Leaven, PA-C      Allergies    Contrast media [iodinated contrast media] and Iodine    Review of Systems   Review of Systems  Respiratory:  Positive for shortness of breath.   Review of systems completed and notable as per HPI.  ROS otherwise negative.   Physical Exam Updated Vital Signs BP (!) 142/99   Pulse 85   Temp 97.8 F (36.6 C)   Resp (!) 21   Ht 5\' 11"  (1.803 m)   Wt 127 kg   SpO2 96%   BMI 39.05 kg/m  Physical Exam Vitals and nursing note reviewed.  Constitutional:      General: He is not in acute distress.    Appearance: He is well-developed.  HENT:     Head: Normocephalic and atraumatic.  Eyes:     Extraocular Movements: Extraocular movements intact.     Conjunctiva/sclera: Conjunctivae normal.     Pupils: Pupils are equal, round, and reactive to light.  Cardiovascular:     Rate and Rhythm: Normal rate and regular rhythm.     Pulses: Normal pulses.     Heart sounds: Normal heart sounds. No murmur heard. Pulmonary:     Comments: Diminished breath sounds bilaterally.  He has increased work of breathing but no audible wheezing or rales. Abdominal:     Palpations: Abdomen is soft.     Tenderness: There is no abdominal tenderness.  Musculoskeletal:        General: No swelling.     Cervical back: Normal range of motion and neck supple. No rigidity or tenderness.     Right lower leg: Edema present.     Left lower leg: Edema present.     Comments: Healing right lower extremity wound without signs of infection.  Significant bilateral lower extremity edema pitting.  Skin:    General: Skin is warm and dry.     Capillary Refill: Capillary refill takes less than 2 seconds.  Neurological:     General: No focal deficit present.     Mental  Status: He is alert and oriented to person, place, and time. Mental status is at baseline.  Psychiatric:        Mood and Affect: Mood normal.     ED Results / Procedures / Treatments   Labs (all labs ordered are listed, but only abnormal results are displayed) Labs Reviewed  BASIC METABOLIC PANEL - Abnormal; Notable for the following components:      Result Value   BUN 33 (*)    Creatinine, Ser 2.04 (*)    Calcium  8.2 (*)  GFR, Estimated 36 (*)    All other components within normal limits  CBC - Abnormal; Notable for the following components:   Hemoglobin 11.2 (*)    HCT 34.9 (*)    RDW 17.2 (*)    All other components within normal limits  BRAIN NATRIURETIC PEPTIDE - Abnormal; Notable for the following components:   B Natriuretic Peptide 582.3 (*)    All other components within normal limits  RESP PANEL BY RT-PCR (RSV, FLU A&B, COVID)  [id number]    EKG EKG Interpretation Date/Time:  Monday March 18 2023 15:42:42 EST Ventricular Rate:  76 PR Interval:    QRS Duration:  102 QT Interval:  404 QTC Calculation: 454 R Axis:   -38  Text Interpretation: Atrial fibrillation Left axis deviation Anterior infarct , age undetermined Abnormal ECG When compared with ECG of 21-Jan-2023 16:42, PREVIOUS ECG IS PRESENT Confirmed by Rueben Cote [phone]) on 03/18/2023 7:02:35 PM  Radiology DG Chest 2 View Result Date: 03/18/2023 CLINICAL DATA:  Short of breath EXAM: CHEST - 2 VIEW COMPARISON:  01/17/2023 FINDINGS: Frontal and lateral views of the chest are obtained on 3 images. The cardiac silhouette is enlarged but stable. There is persistent pulmonary vascular congestion, with no airspace disease, effusion, or pneumothorax. No acute bony abnormalities. IMPRESSION: 1. Stable enlarged cardiac silhouette and chronic pulmonary vascular congestion. No overt edema. Electronically Signed   By: Bobbye Burrow M.D.   On: 03/18/2023 17:19    Procedures Procedures    Medications Ordered in  ED Medications  albuterol  (VENTOLIN  HFA) 108 (90 Base) MCG/ACT inhaler 2 puff (2 puffs Inhalation Given by Other 03/18/23 1541)  albuterol  (VENTOLIN  HFA) 108 (90 Base) MCG/ACT inhaler (  Not Given 03/18/23 1545)  furosemide  (LASIX ) injection 40 mg (40 mg Intravenous Given 03/18/23 1941)    ED Course/ Medical Decision Making/ A&P Clinical Course as of 03/18/23 2256  Mon Mar 18, 2023  2100 Discussed with hospitalist for admission [JD]    Clinical Course User Index [JD] Coleman Daughters, MD                                 Medical Decision Making Amount and/or Complexity of Data Reviewed Labs: ordered. Radiology: ordered.  Risk Prescription drug management. Decision regarding hospitalization.   Medical Decision Making:   Afshin Scheumann. is a 63 y.o. male who presented to the ED today with shortness of breath.  On exam he has increased work of breathing.  He is not hypoxic but has diminished breath sounds.  He has worsening lower extremity edema, chest x-ray with cardiomegaly and vascular congestion he also has elevated BNP concerning for heart failure.  Lasix  ordered.  No chest pain or signs of ACS.   Patient placed on continuous vitals and telemetry monitoring while in ED which was reviewed periodically.  Reviewed and confirmed nursing documentation for past medical history, family history, social history.  Reassessment and Plan:   Patient is not hypoxic but he remains tachypneic.  He is having good urine output with Lasix .  His x-ray is concerning for vascular congestion and cardiomegaly and elevated BNP is concerning for heart failure exacerbation especially with his lower extremity edema.  I spoke with the hospitalist and he was admitted for further management of heart failure exacerbation.  Pending transfer for admission.  Remains stable.   Patient's presentation is most consistent with acute complicated illness / injury requiring diagnostic workup.  Final  Clinical Impression(s) / ED Diagnoses Final diagnoses:  Acute on chronic congestive heart failure, unspecified heart failure type [facility])    Rx / DC Orders ED Discharge Orders     None         Coleman Daughters, MD 03/18/23 2256

## 2023-03-19 DIAGNOSIS — S81801A Unspecified open wound, right lower leg, initial encounter: Secondary | ICD-10-CM | POA: Diagnosis present

## 2023-03-19 DIAGNOSIS — E669 Obesity, unspecified: Secondary | ICD-10-CM | POA: Diagnosis not present

## 2023-03-19 DIAGNOSIS — E1122 Type 2 diabetes mellitus with diabetic chronic kidney disease: Secondary | ICD-10-CM | POA: Diagnosis not present

## 2023-03-19 DIAGNOSIS — D500 Iron deficiency anemia secondary to blood loss (chronic): Secondary | ICD-10-CM | POA: Diagnosis not present

## 2023-03-19 DIAGNOSIS — Z7984 Long term (current) use of oral hypoglycemic drugs: Secondary | ICD-10-CM | POA: Diagnosis not present

## 2023-03-19 DIAGNOSIS — I2510 Atherosclerotic heart disease of native coronary artery without angina pectoris: Secondary | ICD-10-CM | POA: Diagnosis not present

## 2023-03-19 DIAGNOSIS — G43909 Migraine, unspecified, not intractable, without status migrainosus: Secondary | ICD-10-CM | POA: Diagnosis not present

## 2023-03-19 DIAGNOSIS — N1832 Chronic kidney disease, stage 3b: Secondary | ICD-10-CM | POA: Diagnosis not present

## 2023-03-19 DIAGNOSIS — Z1152 Encounter for screening for COVID-19: Secondary | ICD-10-CM | POA: Diagnosis not present

## 2023-03-19 DIAGNOSIS — G4733 Obstructive sleep apnea (adult) (pediatric): Secondary | ICD-10-CM | POA: Diagnosis not present

## 2023-03-19 DIAGNOSIS — I4891 Unspecified atrial fibrillation: Secondary | ICD-10-CM | POA: Diagnosis not present

## 2023-03-19 DIAGNOSIS — E66812 Obesity, class 2: Secondary | ICD-10-CM | POA: Diagnosis present

## 2023-03-19 DIAGNOSIS — E876 Hypokalemia: Secondary | ICD-10-CM | POA: Diagnosis not present

## 2023-03-19 DIAGNOSIS — K5900 Constipation, unspecified: Secondary | ICD-10-CM | POA: Diagnosis not present

## 2023-03-19 DIAGNOSIS — Z8673 Personal history of transient ischemic attack (TIA), and cerebral infarction without residual deficits: Secondary | ICD-10-CM | POA: Diagnosis not present

## 2023-03-19 DIAGNOSIS — I5033 Acute on chronic diastolic (congestive) heart failure: Secondary | ICD-10-CM | POA: Diagnosis not present

## 2023-03-19 DIAGNOSIS — W19XXXA Unspecified fall, initial encounter: Secondary | ICD-10-CM | POA: Diagnosis not present

## 2023-03-19 DIAGNOSIS — Z7901 Long term (current) use of anticoagulants: Secondary | ICD-10-CM | POA: Diagnosis not present

## 2023-03-19 DIAGNOSIS — Z794 Long term (current) use of insulin: Secondary | ICD-10-CM | POA: Diagnosis not present

## 2023-03-19 DIAGNOSIS — E1140 Type 2 diabetes mellitus with diabetic neuropathy, unspecified: Secondary | ICD-10-CM | POA: Diagnosis not present

## 2023-03-19 DIAGNOSIS — I130 Hypertensive heart and chronic kidney disease with heart failure and stage 1 through stage 4 chronic kidney disease, or unspecified chronic kidney disease: Secondary | ICD-10-CM | POA: Diagnosis not present

## 2023-03-19 DIAGNOSIS — R0602 Shortness of breath: Secondary | ICD-10-CM | POA: Diagnosis present

## 2023-03-19 DIAGNOSIS — Z79899 Other long term (current) drug therapy: Secondary | ICD-10-CM | POA: Diagnosis not present

## 2023-03-19 DIAGNOSIS — Z6836 Body mass index (BMI) 36.0-36.9, adult: Secondary | ICD-10-CM | POA: Diagnosis not present

## 2023-03-19 DIAGNOSIS — E785 Hyperlipidemia, unspecified: Secondary | ICD-10-CM | POA: Diagnosis not present

## 2023-03-19 DIAGNOSIS — N400 Enlarged prostate without lower urinary tract symptoms: Secondary | ICD-10-CM | POA: Diagnosis not present

## 2023-03-19 LAB — BASIC METABOLIC PANEL
Anion gap: 6 (ref 5–15)
BUN: 28 mg/dL — ABNORMAL HIGH (ref 8–23)
CO2: 25 mmol/L (ref 22–32)
Calcium: 7.9 mg/dL — ABNORMAL LOW (ref 8.9–10.3)
Chloride: 110 mmol/L (ref 98–111)
Creatinine, Ser: 1.78 mg/dL — ABNORMAL HIGH (ref 0.61–1.24)
GFR, Estimated: 43 mL/min — ABNORMAL LOW (ref >60)
Glucose, Bld: 83 mg/dL (ref 70–99)
Potassium: 3 mmol/L — ABNORMAL LOW (ref 3.5–5.1)
Sodium: 141 mmol/L (ref 135–145)

## 2023-03-19 LAB — GLUCOSE, CAPILLARY: Glucose-Capillary: 81 mg/dL (ref 70–99)

## 2023-03-19 LAB — CBG MONITORING, ED
Glucose-Capillary: 74 mg/dL (ref 70–99)
Glucose-Capillary: 76 mg/dL (ref 70–99)

## 2023-03-19 MED ORDER — DOXAZOSIN MESYLATE 4 MG PO TABS
8.0000 mg | ORAL_TABLET | Freq: Every day | ORAL | Status: DC
  Administered 2023-03-19 – 2023-03-21 (×3): 8 mg via ORAL
  Filled 2023-03-19 (×3): qty 2

## 2023-03-19 MED ORDER — DRONEDARONE HCL 400 MG PO TABS
400.0000 mg | ORAL_TABLET | Freq: Two times a day (BID) | ORAL | Status: DC
  Administered 2023-03-19 – 2023-03-21 (×4): 400 mg via ORAL
  Filled 2023-03-19 (×5): qty 1

## 2023-03-19 MED ORDER — EMPAGLIFLOZIN 10 MG PO TABS
10.0000 mg | ORAL_TABLET | Freq: Every day | ORAL | Status: DC
Start: 2023-03-20 — End: 2023-03-21
  Administered 2023-03-20 – 2023-03-21 (×2): 10 mg via ORAL
  Filled 2023-03-19 (×2): qty 1

## 2023-03-19 MED ORDER — INSULIN ASPART 100 UNIT/ML IJ SOLN: 0.0000 [IU] | Freq: Three times a day (TID) | INTRAMUSCULAR | Status: DC

## 2023-03-19 MED ORDER — ALBUTEROL SULFATE (2.5 MG/3ML) 0.083% IN NEBU: 2.5000 mg | INHALATION_SOLUTION | RESPIRATORY_TRACT | Status: DC | PRN

## 2023-03-19 MED ORDER — POTASSIUM CHLORIDE CRYS ER 20 MEQ PO TBCR
40.0000 meq | EXTENDED_RELEASE_TABLET | Freq: Once | ORAL | Status: AC
  Administered 2023-03-19: 40 meq via ORAL
  Filled 2023-03-19: qty 2

## 2023-03-19 MED ORDER — ACETAMINOPHEN 325 MG PO TABS: 650.0000 mg | ORAL_TABLET | Freq: Four times a day (QID) | ORAL | Status: DC | PRN

## 2023-03-19 MED ORDER — LORATADINE 10 MG PO TABS: 10.0000 mg | ORAL_TABLET | Freq: Every day | ORAL | Status: DC | PRN

## 2023-03-19 MED ORDER — POTASSIUM CHLORIDE 20 MEQ PO PACK
40.0000 meq | PACK | Freq: Once | ORAL | Status: AC
  Administered 2023-03-19: 40 meq via ORAL
  Filled 2023-03-19: qty 2

## 2023-03-19 MED ORDER — AMLODIPINE BESYLATE 10 MG PO TABS: 5.0000 mg | ORAL_TABLET | Freq: Every day | ORAL | Status: DC

## 2023-03-19 MED ORDER — ACETAMINOPHEN 650 MG RE SUPP: 650.0000 mg | Freq: Four times a day (QID) | RECTAL | Status: DC | PRN

## 2023-03-19 MED ORDER — ATORVASTATIN CALCIUM 40 MG PO TABS
80.0000 mg | ORAL_TABLET | Freq: Every day | ORAL | Status: DC
  Administered 2023-03-19 – 2023-03-21 (×3): 80 mg via ORAL
  Filled 2023-03-19 (×3): qty 2

## 2023-03-19 MED ORDER — FUROSEMIDE 10 MG/ML IJ SOLN
40.0000 mg | Freq: Two times a day (BID) | INTRAMUSCULAR | Status: DC
  Administered 2023-03-19 – 2023-03-21 (×4): 40 mg via INTRAVENOUS
  Filled 2023-03-19 (×4): qty 4

## 2023-03-19 MED ORDER — ONDANSETRON HCL 4 MG/2ML IJ SOLN: 4.0000 mg | Freq: Four times a day (QID) | INTRAMUSCULAR | Status: DC | PRN

## 2023-03-19 MED ORDER — ONDANSETRON HCL 4 MG PO TABS
4.0000 mg | ORAL_TABLET | Freq: Four times a day (QID) | ORAL | Status: DC | PRN
Start: 2023-03-19 — End: 2023-03-21

## 2023-03-19 NOTE — Progress Notes (Signed)
PHARMACY ROUNDING NOTE  Justin Molina. is a 63 y.o. male awaiting admission. A chart review was completed to evaluate prior to admission medications, antibiotic therapy and labs/vitals. The following interventions were made:  Recheck BMET s/p furosemide>>K 3, supplementing x 1  This was discussed with the ED or admitting provider and/or nurse/paramedic.   Lysle Pearl, PharmD, BCPS, BCEMP Clinical Pharmacist Please see AMION for all pharmacy numbers 03/19/2023 8:01 AM

## 2023-03-19 NOTE — Progress Notes (Signed)
   03/19/23 2345  BiPAP/CPAP/SIPAP  $ Non-Invasive Home Ventilator  Initial  $ Face Mask XL Yes  BiPAP/CPAP/SIPAP Pt Type Adult  BiPAP/CPAP/SIPAP DREAMSTATIOND  Mask Type Full face mask (pt sleeps with mouth open, full face mask applied)  Dentures removed? Not applicable  Mask Size Large  Respiratory Rate 20 breaths/min  FiO2 (%) 21 %  Patient Home Equipment No  Auto Titrate Yes (AUTOMODE, MIN5CM, MAX20CM H2O)  CPAP/SIPAP surface wiped down Yes  BiPAP/CPAP /SiPAP Vitals  Pulse Rate (!) 112  Resp 20  SpO2 93 %

## 2023-03-19 NOTE — H&P (Signed)
History and Physical    Patient: Justin Molina. XBM:[id number] DOB: [birth year] DOA: 03/18/2023 DOS: the patient was seen and examined on 03/19/2023 PCP: Theodis Shove, DO  Patient coming from: Home  Chief Complaint:  Chief Complaint  Patient presents with   Shortness of Breath   HPI: Justin Molina. is a 63 y.o. male with medical history significant of stage III CKD, BPH, class II obesity, constipation, type 2 diabetes, diabetic retinopathy, hyperlipidemia, hypertension, OSA on CPAP, wheelchair-bound, history of CVA with residual right-sided hemiparesis, aphasia, anxiety, history of sepsis, unspecified atrial fibrillation on apixaban, coronary arthrosclerosis, heart failure with preserved ejection fraction who presented to the emergency department with complaints of progressively worse dyspnea, occasionally productive cough, wheezing and lower extremity edema since Saturday. He denied fever, chills, rhinorrhea, sore throat, wheezing or hemoptysis.  No chest pain, palpitations, diaphoresis, PND, orthopnea or pitting edema of the lower extremities.  No abdominal pain, nausea, emesis, diarrhea, constipation, melena or hematochezia.  No flank pain, dysuria, frequency or hematuria.  No polyuria, polydipsia, polyphagia or blurred vision.   ED course: Initial vital signs were temperature 97.8 F, pulse 70, respiration 20, BP 142/94 mmHg O2 sat 94% on room air.  The patient received furosemide 40 mg IVP and KCl 40 mill equivalents p.o. x 1.  Lab work: CBC showed a white count 5.3, hemoglobin 11.2 g/dL platelets 027.  BMP showed BUN of 33, creatinine 2.04 and calcium of 8.2 mg/dL.  Glucose and the rest of the electrolytes were normal.  BNP was 582.3 pg/mL.  Coronavirus, influenza and RSV PCR was negative.  Imaging: 2 view chest radiograph showing a stable enlarged cardiac silhouette and chronic pulmonary vascular congestion.  No overt edema.   Review of Systems: As mentioned in the  history of present illness. All other systems reviewed and are negative. Past Medical History:  Diagnosis Date   Chronic renal insufficiency    Diabetes mellitus without complication (HCC)    Hypertension    Stroke [facility])    Past Surgical History:  Procedure Laterality Date   BRAIN SURGERY     GASTROSTOMY TUBE PLACEMENT     HERNIA REPAIR     TRACHEOSTOMY     Social History:  reports that he has never smoked. He has never used smokeless tobacco. He reports that he does not drink alcohol and does not use drugs.  Allergies  Allergen Reactions   Contrast Media [Iodinated Contrast Media] Nausea And Vomiting and Swelling   Iodine Swelling    No family history on file.  Prior to Admission medications   Medication Sig Start Date End Date Taking? Authorizing Provider  acetaminophen (TYLENOL) 500 MG tablet Take 1 tablet (500 mg total) by mouth every 6 (six) hours as needed. 12/26/14   Teressa Lower, NP  albuterol (VENTOLIN HFA) 108 (90 Base) MCG/ACT inhaler Inhale 2 puffs into the lungs every 4 (four) hours as needed for wheezing or shortness of breath. 09/24/22   [provider]  amLODipine (NORVASC) 5 MG tablet Take 1 tablet (5 mg total) by mouth daily. 11/17/21   Ellsworth Lennox, PA-C  atorvastatin (LIPITOR) 80 MG tablet Take 1 tablet (80 mg total) by mouth daily. 11/17/21   Ellsworth Lennox, PA-C  blood glucose meter kit and supplies KIT Dispense based on patient and insurance preference. Use up to four times daily as directed. 11/17/21   Ellsworth Lennox, PA-C  Blood Glucose Monitoring Suppl (ACCU-CHEK AVIVA PLUS) W/DEVICE KIT Check blood sugar TID  & QHS 11/17/14  Holland Commons A, NP  Blood Pressure Monitoring (BLOOD PRESSURE CUFF) MISC 1 Units by Does not apply route daily. 11/17/21   Ellsworth Lennox, PA-C  carvedilol (COREG) 25 MG tablet Take 1 tablet (25 mg total) by mouth 2 (two) times daily with a meal. Patient needs office visit for more refills 11/17/21   Ellsworth Lennox, PA-C   doxazosin (CARDURA) 8 MG tablet Take 1 tablet (8 mg total) by mouth daily. 11/17/21   Ellsworth Lennox, PA-C  ELIQUIS 5 MG TABS tablet Take 1 tablet (5 mg total) by mouth 2 (two) times daily. 11/17/21   Ellsworth Lennox, PA-C  fluticasone [facility]) 50 MCG/ACT nasal spray Place 2 sprays into both nostrils daily as needed for allergies or rhinitis. 09/24/22   [provider]  furosemide (LASIX) 40 MG tablet Take 40 mg by mouth daily. 12/25/22   [provider]  glucose blood (ACCU-CHEK AVIVA PLUS) test strip Use as instructed for TID and QHS blood glucose testing 05/23/15   Quentin Angst, MD  hydrALAZINE (APRESOLINE) 50 MG tablet Take 3 tablets (150 mg total) by mouth 3 (three) times daily. 01/25/23 02/24/23  Hughie Closs, MD  isosorbide mononitrate (IMDUR) 120 MG 24 hr tablet Take 1 tablet (120 mg total) by mouth daily. 11/17/21   Ellsworth Lennox, PA-C  JARDIANCE 10 MG TABS tablet Take 10 mg by mouth daily. 12/25/22   [provider]  Lancets 28G MISC Check blood sugar TID & QHS 05/23/15   Jegede, Olugbemiga E, MD  LANTUS SOLOSTAR 100 UNIT/ML Solostar Pen Inject 15 Units into the skin at bedtime. 01/14/23   [provider]  lisinopril (ZESTRIL) 20 MG tablet Take 1 tablet (20 mg total) by mouth daily. 01/26/23 02/25/23  Hughie Closs, MD  loratadine (CLARITIN) 10 MG tablet Take 10 mg by mouth daily as needed. 11/06/22   [provider]  Misc. Devices (BATHTUB SAFETY RAIL) MISC Patient needs safety rails for shower 01/04/15   Holland Commons A, NP  MULTAQ 400 MG tablet Take 1 tablet (400 mg total) by mouth 2 (two) times daily. 11/17/21   Ellsworth Lennox, PA-C  OZEMPIC, 0.25 OR 0.5 MG/DOSE, 2 MG/3ML SOPN Inject 0.5 mg into the skin once a week. Every Sunday 12/27/22   [provider]  potassium chloride (KLOR-CON M) 10 MEQ tablet Take 1 tablet (10 mEq total) by mouth daily. 11/17/21   Ellsworth Lennox, PA-C    Physical Exam: Vitals:   03/19/23 1200 03/19/23 1217  03/19/23 1230 03/19/23 1423  BP: (!) 135/99 (!) 128/100 (!) 143/94 (!) 152/93  Pulse: 77 79 79 82  Resp: (!) 21 (!) 25 (!) 26 (!) 23  Temp:  97.9 F (36.6 C) 97.9 F (36.6 C) 97.9 F (36.6 C)  TempSrc:  Oral  Oral  SpO2: 96% 94% 94% 97%  Weight:      Height:       Physical Exam Vitals and nursing note reviewed.  Constitutional:      General: He is awake. He is not in acute distress.    Appearance: He is well-developed. He is obese. He is ill-appearing.  HENT:     Head: Normocephalic.     Nose: No rhinorrhea.  Eyes:     General: No scleral icterus.    Pupils: Pupils are equal, round, and reactive to light.  Neck:     Vascular: No JVD.  Cardiovascular:     Rate and Rhythm: Normal rate and regular rhythm.     Heart sounds: S1  normal and S2 normal.  Pulmonary:     Effort: Pulmonary effort is normal.     Breath sounds: Examination of the right-lower field reveals decreased breath sounds and rales. Examination of the left-lower field reveals decreased breath sounds and rales. Decreased breath sounds and rales present. No wheezing or rhonchi.  Abdominal:     General: Bowel sounds are normal.     Palpations: Abdomen is soft.     Tenderness: There is no abdominal tenderness. There is no right CVA tenderness or left CVA tenderness.  Musculoskeletal:     Cervical back: Neck supple.     Right lower leg: No edema.     Left lower leg: No edema.  Skin:    General: Skin is warm and dry.  Neurological:     Mental Status: He is alert and oriented to person, place, and time.     Motor: Weakness present.  Psychiatric:        Mood and Affect: Mood normal.        Behavior: Behavior normal. Behavior is cooperative.     Data Reviewed:  Results are pending, will review when available. 01/19/2023 echocardiogram report. IMPRESSIONS:   1. Left ventricular ejection fraction, by estimation, is 60 to 65%. The  left ventricle has normal function. The left ventricle has no regional  wall  motion abnormalities. There is mild left ventricular hypertrophy.  Left ventricular diastolic parameters  were normal.   2. Right ventricular systolic function is normal. The right ventricular  size is normal.   3. Left atrial size was mildly dilated.   4. The mitral valve is normal in structure. No evidence of mitral valve  regurgitation. No evidence of mitral stenosis.   5. The aortic valve is normal in structure. Aortic valve regurgitation is  not visualized. No aortic stenosis is present.   6. The inferior vena cava is normal in size with greater than 50%  respiratory variability, suggesting right atrial pressure of 3 mmHg.   EKG: Vent. rate 76 BPM PR interval * ms QRS duration 102 ms QT/QTcB 404/454 ms P-R-T axes * -38 60 Atrial fibrillation Left axis deviation Anterior infarct , age undetermined Abnormal ECG  Assessment and Plan: Principal Problem:   Acute on chronic heart failure with preserved ejection fraction (HFpEF) (HCC) Observation/telemetry. Continue supplemental oxygen.   Sodium and fluid restriction. Continue furosemide 40 mg IVP daily. Continue Imdur 120 mg p.o. daily. Monitor daily weights, intake and output. Resume lisinopril in a.m. depending on GFR. Continue carvedilol 25 mg p.o. twice daily. Check echocardiogram in a.m. Cardiology will be seeing in the morning.  Active Problems:   DM (diabetes mellitus), type 2 with renal complications (HCC) Carbohydrate modified diet. CBG monitoring with RI SS. Glucose has been normal. Will hold Lantus for now. Check hemoglobin A1c.    Diabetic neuropathy (HCC) Analgesics as needed.    HTN (hypertension). Continue amlodipine 5 mg p.o. daily.   Carvedilol 25 mg p.o. twice daily. Continue doxazozin 8 mg p.o. daily. Also on isosorbide mononitrate as above. Monitor blood pressure and heart rate.    CKD (chronic kidney disease) stage 3, GFR 30-59 ml/min (HCC) Renal function improved after diuresis. Monitor  renal function electrolytes.    Unspecified atrial fibrillation (HCC) CHA?DS?-VASc Score of at least 6. Continue carvedilol 25 mg p.o. twice daily. Continue apixaban 5 mg p.o. twice daily.    Coronary atherosclerosis Continue atorvastatin, beta-blocker, amlodipine, nitrates. He is on apixaban twice daily instead of antiplatelet therapy.  Constipation Laxatives as needed.    Hypokalemia Replacing. Follow-up potassium level.    Class 2 obesity Current BMI 39.05 kg/m. Could benefit from lifestyle modifications. Follow-up with PCP as an outpatient.    Open wound of right lower extremity Consult wound and ostomy care. Keep blood glucose under control.    Benign prostatic hyperplasia Continue doxazosin 8 mg p.o. bedtime. Follow-up with urology as an outpatient.    HLD (hyperlipidemia) Continue atorvastatin 80 mg p.o. daily.    OSA (obstructive sleep apnea) CPAP at bedtime.    History of CVA with residual deficit Supportive care with transfers. Consider PT evaluation if beneficial.    Migraine headaches Continue Multaq as needed.   Advance Care Planning:   Code Status: Full Code   Consults:   Family Communication:   Severity of Illness: The appropriate patient status for this patient is OBSERVATION. Observation status is judged to be reasonable and necessary in order to provide the required intensity of service to ensure the patient's safety. The patient's presenting symptoms, physical exam findings, and initial radiographic and laboratory data in the context of their medical condition is felt to place them at decreased risk for further clinical deterioration. Furthermore, it is anticipated that the patient will be medically stable for discharge from the hospital within 2 midnights of admission.   Author: Bobette Mo, MD 03/19/2023 2:37 PM  For on call review [email or web address].   This document was prepared using Dragon voice recognition software and may  contain some unintended transcription errors.

## 2023-03-19 NOTE — ED Notes (Signed)
Family member washing the patient Clean gown

## 2023-03-19 NOTE — ED Notes (Signed)
Verbal order received to put xeroform gauze on pt's status ulcer on rt. Lateral lower leg. Pt does have moderate amt of serous drainage noted.  Unna boot was removed by previous Charity fundraiser.  Wound was covered with 4x4s and secured with medipore tape

## 2023-03-19 NOTE — Plan of Care (Signed)
  Problem: Activity: Goal: Risk for activity intolerance will decrease Outcome: Progressing   Problem: Nutrition: Goal: Adequate nutrition will be maintained Outcome: Progressing   Problem: Coping: Goal: Level of anxiety will decrease Outcome: Progressing   Problem: Elimination: Goal: Will not experience complications related to bowel motility Outcome: Progressing   Patient had BM, planning to request stool softener in morning.

## 2023-03-19 NOTE — ED Notes (Signed)
Spouse has left to go home to rest.  She wishes to be notified of any changes in pt's condition and if he moves to another hospital during the night

## 2023-03-20 DIAGNOSIS — I5033 Acute on chronic diastolic (congestive) heart failure: Secondary | ICD-10-CM | POA: Diagnosis not present

## 2023-03-20 LAB — COMPREHENSIVE METABOLIC PANEL
ALT: 17 U/L (ref 0–44)
AST: 16 U/L (ref 15–41)
Albumin: 3.2 g/dL — ABNORMAL LOW (ref 3.5–5.0)
Alkaline Phosphatase: 74 U/L (ref 38–126)
Anion gap: 9 (ref 5–15)
BUN: 23 mg/dL (ref 8–23)
CO2: 25 mmol/L (ref 22–32)
Calcium: 7.8 mg/dL — ABNORMAL LOW (ref 8.9–10.3)
Chloride: 107 mmol/L (ref 98–111)
Creatinine, Ser: 1.5 mg/dL — ABNORMAL HIGH (ref 0.61–1.24)
GFR, Estimated: 52 mL/min — ABNORMAL LOW (ref >60)
Glucose, Bld: 73 mg/dL (ref 70–99)
Potassium: 3 mmol/L — ABNORMAL LOW (ref 3.5–5.1)
Sodium: 141 mmol/L (ref 135–145)
Total Bilirubin: 1.1 mg/dL (ref 0.0–1.2)
Total Protein: 7.6 g/dL (ref 6.5–8.1)

## 2023-03-20 LAB — GLUCOSE, CAPILLARY
Glucose-Capillary: 75 mg/dL (ref 70–99)
Glucose-Capillary: 113 mg/dL — ABNORMAL HIGH (ref 70–99)
Glucose-Capillary: 82 mg/dL (ref 70–99)
Glucose-Capillary: 122 mg/dL — ABNORMAL HIGH (ref 70–99)

## 2023-03-20 LAB — CBC
HCT: 36.2 % — ABNORMAL LOW (ref 39.0–52.0)
Hemoglobin: 11.2 g/dL — ABNORMAL LOW (ref 13.0–17.0)
MCH: 25.9 pg — ABNORMAL LOW (ref 26.0–34.0)
MCHC: 30.9 g/dL (ref 30.0–36.0)
MCV: 83.6 fL (ref 80.0–100.0)
Platelets: 210 10*3/uL (ref 150–400)
RBC: 4.33 MIL/uL (ref 4.22–5.81)
RDW: 17.2 % — ABNORMAL HIGH (ref 11.5–15.5)
WBC: 5.6 10*3/uL (ref 4.0–10.5)
nRBC: 0 % (ref 0.0–0.2)

## 2023-03-20 MED ORDER — POTASSIUM CHLORIDE 20 MEQ PO PACK
40.0000 meq | PACK | Freq: Once | ORAL | Status: AC
  Administered 2023-03-20: 40 meq via ORAL
  Filled 2023-03-20: qty 2

## 2023-03-20 MED ORDER — POTASSIUM CHLORIDE CRYS ER 20 MEQ PO TBCR
20.0000 meq | EXTENDED_RELEASE_TABLET | Freq: Every day | ORAL | Status: DC
  Administered 2023-03-20 – 2023-03-21 (×2): 20 meq via ORAL
  Filled 2023-03-20 (×2): qty 1

## 2023-03-20 NOTE — Evaluation (Signed)
Physical Therapy Evaluation Patient Details Name: Justin Molina. MRN: [id number] DOB: [birth year] Today's Date: 03/20/2023  History of Present Illness  Justin Molina. is a 63 y.o. male presents with SOB; admitted with Acute on chronic heart failure with preserved ejection fraction. PMH: CKD, diabetes, HTN, prior stroke with residual aphasia and R hemiparesis, afib  Clinical Impression  Pt admitted with above diagnosis. Pt with aphasia, able to state yes and no to therapist's questions, if this therapist misinterprets pt's gestures pt able to state no to correct therapist. Pt comes to sitting EOB with min A, pulls therapist's hand to upright trunk into sitting. On eval, pt powers to stand with rocking momentum from EOB and recliner, min to CGA to power up. Pt ambulates with hemiwalker in L hand with CGA+2 for safety, attempted to bring recliner for seated rest breaks but pt declines. Pt SOB with ambulation, on RA with SpO2 97-98%, HR 90s-110s. Spouse on phone at end of eval, surprised by pt's ambulation and concerned regarding his SOB; relayed messages. Anticipate return home with spouse support and HHPT services. Pt currently with functional limitations due to the deficits listed below (see PT Problem List). Pt will benefit from acute skilled PT to increase their independence and safety with mobility to allow discharge.           If plan is discharge home, recommend the following: A little help with walking and/or transfers;A little help with bathing/dressing/bathroom;Assistance with cooking/housework;Assist for transportation   Can travel by private vehicle        Equipment Recommendations None recommended by PT  Recommendations for Other Services       Functional Status Assessment Patient has had a recent decline in their functional status and demonstrates the ability to make significant improvements in function in a reasonable and predictable amount of time.     Precautions /  Restrictions Precautions Precautions: Fall Precaution/Restrictions Comments: R hemiparesis Restrictions Weight Bearing Restrictions Per Provider Order: No      Mobility  Bed Mobility Overal bed mobility: Needs Assistance Bed Mobility: Supine to Sit     Supine to sit: Min assist     General bed mobility comments: pt pulls therapist's hand to upright trunk into sitting, self assists RLE to EOB    Transfers Overall transfer level: Needs assistance Equipment used: Hemi-walker Transfers: Sit to/from Stand Sit to Stand: Min assist, Contact guard assist           General transfer comment: min to CGA for transfers, pt using rocking momentum to power up from EOB and recliner    Ambulation/Gait Ambulation/Gait assistance: Contact guard assist, +2 safety/equipment Gait Distance (Feet): 30 Feet Assistive device: Hemi-walker Gait Pattern/deviations: Step-to pattern, Decreased stride length Gait velocity: decreased     General Gait Details: step to gait pattern, R footdrop and decreased hip/knee flexion in swing noted with circumduction compensation, pivots on R foot for turns, dyspnea noted with pt on RA and SpO2 97-98% and HR 90s-low 110s  Stairs            Wheelchair Mobility     Tilt Bed    Modified Rankin (Stroke Patients Only)       Balance Overall balance assessment: Needs assistance         Standing balance support: Reliant on assistive device for balance, During functional activity, Single extremity supported Standing balance-Leahy Scale: Fair Standing balance comment: static able to stand wtihout UE support, dynamic with hemiwalker  Pertinent Vitals/Pain Pain Assessment Pain Assessment: No/denies pain    Home Living Family/patient expects to be discharged to:: Private residence Living Arrangements: Spouse/significant other Available Help at Discharge: Family Type of Home: Apartment Home Access: Level  entry       Home Layout: One level Home Equipment: Tub bench;Wheelchair - manual;BSC/3in1;Wheelchair - power;Hospital bed;Other (comment) (hemiwalker)      Prior Function               Mobility Comments: The pt used a RLE AFO and hemi-walker for household ambulation. He has been receiving home health OT and PT services. - taken from previous admission; this admission pt states "yes" when asked about ambulating, states walks "there and back", nods head yes to hemi walker use ADLs Comments: The pt's spouse assisted with providing information regarding the pt's prior level of functioning and living situation, due to the pt's expressive aphasia. The pt lives with his spouse in an apartment, he fed himself without assist using his L UE, he required increased time and effort for dressing though he could typically complete himself, his spouse provided light assistance for tub transfers, the pt used a urinal for voiding urine which he could empty independently, and he managed toileting without assist though his spouse stated he had occasional difficulty with thoroughness after having a bowel movement. The pt did not cook on the stove, however could warm food in the microwave, as well as fix simple things like a bowel of cereal or sandwich. He does not drive. - taken from previous admission, spouse not present on eval     Extremity/Trunk Assessment        Lower Extremity Assessment Lower Extremity Assessment: RLE deficits/detail;LLE deficits/detail RLE Deficits / Details: foot drop noted, knee extension 2-/5, hip flexion 2-/5 LLE Deficits / Details: AROM WFL, strength grossly 4+/5 LLE Sensation: WNL LLE Coordination: WNL    Cervical / Trunk Assessment Cervical / Trunk Assessment: Normal  Communication   Communication Communication: Impaired (aphasia) Factors Affecting Communication: Difficulty expressing self    Cognition Arousal: Alert Behavior During Therapy: WFL for tasks  assessed/performed                           PT - Cognition Comments: pt states yes and no appropriately, when this therapist's repeats back what I think pt is trying to say he will state yes or no if I accurately understand Following commands: Intact       Cueing       General Comments      Exercises     Assessment/Plan    PT Assessment Patient needs continued PT services  PT Problem List Decreased strength;Decreased activity tolerance;Decreased balance;Cardiopulmonary status limiting activity       PT Treatment Interventions DME instruction;Gait training;Functional mobility training;Therapeutic activities;Therapeutic exercise;Balance training;Neuromuscular re-education;Patient/family education    PT Goals (Current goals can be found in the Care Plan section)  Acute Rehab PT Goals Patient Stated Goal: agreeable to therapy PT Goal Formulation: With patient Time For Goal Achievement: 04/03/23 Potential to Achieve Goals: Good    Frequency Min 1X/week     Co-evaluation               AM-PAC PT "6 Clicks" Mobility  Outcome Measure Help needed turning from your back to your side while in a flat bed without using bedrails?: A Little Help needed moving from lying on your back to sitting on the side of a flat bed  without using bedrails?: A Little Help needed moving to and from a bed to a chair (including a wheelchair)?: A Little Help needed standing up from a chair using your arms (e.g., wheelchair or bedside chair)?: A Little Help needed to walk in hospital room?: A Little Help needed climbing 3-5 steps with a railing? : A Lot 6 Click Score: 17    End of Session Equipment Utilized During Treatment: Gait belt Activity Tolerance: Patient tolerated treatment well Patient left: in chair;with call bell/phone within reach;with chair alarm set (batteries dead, notified nursing) Nurse Communication: Mobility status PT Visit Diagnosis: Other abnormalities of gait  and mobility (R26.89);Muscle weakness (generalized) (M62.81);Hemiplegia and hemiparesis Hemiplegia - Right/Left: Right    Time: 1324-4010 PT Time Calculation (min) (ACUTE ONLY): 50 min   Charges:   PT Evaluation $PT Eval Moderate Complexity: 1 Mod PT Treatments $Gait Training: 8-22 mins $Therapeutic Activity: 8-22 mins PT General Charges $$ ACUTE PT VISIT: 1 Visit         Tori Citlally Captain PT, DPT 03/20/23, 3:22 PM

## 2023-03-20 NOTE — Consult Note (Addendum)
WOC Nurse Consult Note: patient is seen at [facility] for ongoing management of wound R lower lateral to posterior leg; their order 03/18/2023 reads Apply Silver Alginate, dry dressing and unna boot. Change 3 times a week.  Reason for Consult: RLE wound  Wound type: full thickness likely r/t edema  Pressure Injury POA: NA, not pressure related  Measurement: 4 cm x 9 cm x 0.2 cm  Wound bed:50% red moist 30% pink dry 20% yellow  Drainage (amount, consistency, odor) minimal serosanguinous  Periwound: edema, scaly dry skin,  patient has evidence of scar tissue from old healed ulcers to L leg (NO OPEN ACTIVE WOUNDS ON LEFT LEG AT TIME OF THIS VISIT)  Dressing procedure/placement/frequency: Clean R lower leg wound with NS, apply silver hydrofiber to wound bed Monday and Thursday prior to ortho tech applying unna boot. May secure silver to wound using silicone foam prior to application of unna boot.     Spoke to patients wife regarding this wound.  Informed wife we do not do unna boots every other day in the hospital (currently has home health coming out to do this) but we could apply unna boots 2 times a week until he is discharged then they can pick up services with home health and [facility] as prior to admission.  Wife agreed to this plan.   Today this WOC RN cleaned wound with Vashe wound cleanser, applied silver hydrofiber to wound bed, covered with ABD pad and wrapped with Kerlix roll gauze beginning right above toes and ending below knee. Secured with Ace bandage wrapped in same fashion as Kerlix for light compression.    POC discussed with primary MD and bedside nurse. WOC team will not follow. RE-consult if further needs arise.   Thank you,    Priscella Mann MSN, RN-BC, [employer] [phone]

## 2023-03-20 NOTE — Plan of Care (Signed)
Reviewing plan of care.  Problem: Clinical Measurements: Goal: Ability to maintain clinical measurements within normal limits will improve Outcome: Progressing Goal: Will remain free from infection Outcome: Progressing Goal: Diagnostic test results will improve Outcome: Progressing Goal: Respiratory complications will improve Outcome: Progressing Goal: Cardiovascular complication will be avoided Outcome: Progressing   Problem: Activity: Goal: Risk for activity intolerance will decrease Outcome: Progressing   Problem: Elimination: Goal: Will not experience complications related to bowel motility Outcome: Progressing Goal: Will not experience complications related to urinary retention Outcome: Progressing   Problem: Skin Integrity: Goal: Risk for impaired skin integrity will decrease Outcome: Progressing   Problem: Cardiac: Goal: Ability to achieve and maintain adequate cardiopulmonary perfusion will improve Outcome: Progressing   Problem: Fluid Volume: Goal: Ability to maintain a balanced intake and output will improve Outcome: Progressing   Problem: Health Behavior/Discharge Planning: Goal: Ability to identify and utilize available resources and services will improve Outcome: Progressing Goal: Ability to manage health-related needs will improve Outcome: Progressing   Filiberto Pinks, RN

## 2023-03-20 NOTE — Progress Notes (Signed)
PROGRESS NOTE Justin Molina.  ZOX:[id number] DOB: [birth year] DOA: 03/18/2023 PCP: Theodis Shove, DO  Brief Narrative/Hospital Course: 63 y.o. male with medical history significant for CKD 3A B/L REAT ~ 1.5 BPH, class II obesity, constipation, type 2 diabetes, diabetic retinopathy, hyperlipidemia, hypertension, OSA on CPAP, wheelchair-bound, history of CVA with residual right-sided hemiparesis, aphasia, anxiety, atrial fibrillation on apixaban, coronary arthrosclerosis, heart failure with preserved ejection fraction who presented to the ED with complaints of progressively worse dyspnea, occasionally productive cough, wheezing and lower extremity edema since Saturday 03/16/23. IN ED: Initial vital signs were temperature 97.8 F, pulse 70, respiration 20, BP 142/94 mmHg O2 sat 94% on room air. Lab work: CBC showed a white count 5.3, hemoglobin 11.2 g/dL platelets 811.  BMP showed BUN of 33, creatinine 2.04 and calcium of 8.2 mg/dL.  Glucose and the rest of the electrolytes were normal.  BNP was 582.3 pg/mL.  Coronavirus, influenza and RSV PCR was negative. Imaging: 2 view chest radiograph showing a stable enlarged cardiac silhouette and chronic pulmonary vascular congestion.  No overt edema.The patient received furosemide 40 mg IVP and KCl 40 mill equivalents p.o. x 1.  Patient is admitted for further management    Subjective: Seen and examined, family at the bedside patient reports feels better today legs still swollen somewhat Denies orthopnea, he reports symptomatic mostly on activity right side is weak Requesting t PTOT evaluation   Assessment and Plan: Principal Problem:   Acute on chronic heart failure with preserved ejection fraction (HFpEF) (HCC) Active Problems:   DM (diabetes mellitus), type 2 with renal complications (HCC)   Diabetic neuropathy (HCC)   HTN (hypertension)   History of CVA with residual deficit   CKD (chronic kidney disease) stage 3, GFR 30-59 ml/min (HCC)    HLD (hyperlipidemia)   OSA (obstructive sleep apnea)   Unspecified atrial fibrillation (HCC)   Benign prostatic hyperplasia   Coronary atherosclerosis   Constipation   Hypokalemia   Class 2 obesity   Open wound of right lower extremity   Migraine headaches   Acute on chronic CHF with preserved EF: Patient presenting with dyspnea, leg edema, BNP elevated at 500.  Influenza COVID RSV negative.  Chest x-ray no pneumonia but chronic pulmonary vascular congestion.  Continue IV diuresis. Baseline wt unknown not recently checked 100 to 90s in December.  On admission to 280 currently 284 lb. Monitor strict I/O,daly weight, electrolytes, Cont salt and fluid restricted diet. GDMT:pending echo,per cardio.   Hypokalemia CKD 3B: Replace potassium, monitor renal function, creatinine at baseline Recent Labs    01/08/23 1031 01/17/23 1527 01/18/23 0513 01/19/23 0509 01/20/23 0911 01/23/23 0523 01/25/23 0513 03/18/23 1543 03/19/23 0809 03/20/23 0411  BUN 23 22 18 16 18 21  25* 33* 28* 23  CREATININE 1.98* 2.17* 1.88* 1.66* 1.53* 1.80* 1.95* 2.04* 1.78* 1.50*  CO2 30 25 24 25 25 25 28 23 25  25  K 3.5 3.3* 3.2* 3.1* 3.6 3.4* 3.3* 3.5 3.0* 3.0*    Mild chronic anemia: Hemoglobin stable  Type 2 diabetes mellitus with CKD and neuropathy: Blood sugar well-controlled.  Holding home Lantus 15 units Ozempic, continue SSI, Jardiance Recent Labs  Lab 03/19/23 0759 03/19/23 1307 03/19/23 2158 03/20/23 0732  GLUCAP 74 76 81 75    Essential hypertension Coronary atherosclerosis HLD: BP is well-controlled.  PTA on amlodipine Lasix hydralazine lisinopril  But med rec pending **.  Continue Coreg diuretics, Imdur.  Statin and aspirin.   History of CVA with residual deficit: Continue supportive  care PT OT eval  History of A-fib: Patient on Coreg, Multaq and Eliquis and will be continued monitor heart rate  Constipation: Continue laxatives  BPH: Continue Cardura  OSA: Continue CPAP  bedtime  Migraine headache: Continue meds as needed  Open wound of right lower extremity: Wound care consulted, he is followed by wound care clinic  Obesity: Patient's Body mass index is 39.66 kg/m. : Will benefit with PCP follow-up, weight loss  healthy lifestyle and outpatient sleep evaluation-patient and wife are aware that they need to get PCP for sleep referral.   DVT prophylaxis:  Code Status:   Code Status: Full Code Family Communication: plan of care discussed with patient/AMILY at bedside. Patient status is: Remains hospitalized because of severity of illness Level of care: Telemetry   Dispo: The patient is from: HOME            Anticipated disposition: TBD pending PT evaluation Objective: Vitals last 24 hrs: Vitals:   03/20/23 0000 03/20/23 0100 03/20/23 0308 03/20/23 0520  BP:   (!) 143/95   Pulse:   (!) 109   Resp: 20 20 14    Temp:   98.1 F (36.7 C)   TempSrc:   Oral   SpO2:   96%   Weight:    129 kg  Height:       Weight change: 1.993 kg  Physical Examination: General exam: alert awake,at baseline, older than stated age HEENT:Oral mucosa moist, Ear/Nose WNL grossly Respiratory system: Bilaterally clear BS,no use of accessory muscle Cardiovascular system: S1 & S2 +, No JVD. Gastrointestinal system: Abdomen soft,NT,ND, BS+ Nervous System: Alert, awake, moving all extremities W/ baseline weakness on the right leg  Extremities: LE edema neg,distal peripheral pulses palpable and warm.  Skin: No rashes,no icterus. WOUNDS ON RT LEG MSK: Normal muscle bulk,tone, power   Medications reviewed:  Scheduled Meds:  apixaban  5 mg Oral BID   atorvastatin  80 mg Oral Daily   carvedilol  25 mg Oral BID WC   doxazosin  8 mg Oral Daily   dronedarone  400 mg Oral BID   empagliflozin  10 mg Oral Daily   furosemide  40 mg Intravenous BID   insulin aspart  0-15 Units Subcutaneous TID WC   isosorbide mononitrate  120 mg Oral Daily   potassium chloride  40 mEq Oral  Once   potassium chloride  20 mEq Oral Daily  Continuous Infusions:  Diet Order             Diet heart healthy/carb modified Room service appropriate? Yes; Fluid consistency: Thin; Fluid restriction: 1200 mL Fluid  Diet effective now                   Intake/Output Summary (Last 24 hours) at 03/20/2023 1017 Last data filed at 03/20/2023 0805 Gross per 24 hour  Intake 1600 ml  Output 3700 ml  Net -2100 ml  Net IO Since Admission: -4,325 mL [03/20/23 1017]  Wt Readings from Last 3 Encounters:  03/20/23 129 kg  02/27/23 135.6 kg  01/18/23 135.9 kg     Unresulted Labs (From admission, onward)    None      Data Reviewed: I have personally reviewed following labs and imaging studies CBC: Recent Labs  Lab 03/18/23 1543 03/20/23 0411  WBC 5.3 5.6  HGB 11.2* 11.2*  HCT 34.9* 36.2*  MCV 81.5 83.6  PLT 196 210   Basic Metabolic Panel:  Recent Labs  Lab 03/18/23 1543 03/19/23 0809 03/20/23  0411  NA 141 141 141  K 3.5 3.0* 3.0*  CL 109 110 107  CO2 23 25 25   GLUCOSE 90 83 73  BUN 33* 28* 23  CREATININE 2.04* 1.78* 1.50*  CALCIUM 8.2* 7.9* 7.8*   GFR: Estimated Creatinine Clearance: 69.9 mL/min (A) (by C-G formula based on SCr of 1.5 mg/dL (H)). Liver Function Tests:  Recent Labs  Lab 03/20/23 0411  AST 16  ALT 17  ALKPHOS 74  BILITOT 1.1  PROT 7.6  ALBUMIN 3.2*  No results for input(s): "CHOL", "HDL", "LDLCALC", "TRIG", "CHOLHDL", "LDLDIRECT" in the last 72 hours. No results for input(s): "TSH", "T4TOTAL", "FREET4", "T3FREE", "THYROIDAB" in the last 72 hours. Sepsis Labs: No results for input(s): "PROCALCITON", "LATICACIDVEN" in the last 168 hours. Recent Results (from the past 240 hours)  Resp panel by RT-PCR (RSV, Flu A&B, Covid) Anterior Nasal Swab     Status: None   Collection Time: 03/18/23  8:16 PM   Specimen: Anterior Nasal Swab  Result Value Ref Range Status   SARS Coronavirus 2 by RT PCR NEGATIVE NEGATIVE Final    Comment: (NOTE) SARS-CoV-2  target nucleic acids are NOT DETECTED.  The SARS-CoV-2 RNA is generally detectable in upper respiratory specimens during the acute phase of infection. The lowest concentration of SARS-CoV-2 viral copies this assay can detect is 138 copies/mL. A negative result does not preclude SARS-Cov-2 infection and should not be used as the sole basis for treatment or other patient management decisions. A negative result may occur with  improper specimen collection/handling, submission of specimen other than nasopharyngeal swab, presence of viral mutation(s) within the areas targeted by this assay, and inadequate number of viral copies(<138 copies/mL). A negative result must be combined with clinical observations, patient history, and epidemiological information. The expected result is Negative.  Fact Sheet for Patients:  BloggerCourse.com  Fact Sheet for Healthcare Providers:  SeriousBroker.it  This test is no t yet approved or cleared by the [place] FDA and  has been authorized for detection and/or diagnosis of SARS-CoV-2 by FDA under an Emergency Use Authorization (EUA). This EUA will remain  in effect (meaning this test can be used) for the duration of the COVID-19 declaration under Section 564(b)(1) of the Act, 21 U.S.C.section 360bbb-3(b)(1), unless the authorization is terminated  or revoked sooner.       Influenza A by PCR NEGATIVE NEGATIVE Final   Influenza B by PCR NEGATIVE NEGATIVE Final    Comment: (NOTE) The Xpert Xpress SARS-CoV-2/FLU/RSV plus assay is intended as an aid in the diagnosis of influenza from Nasopharyngeal swab specimens and should not be used as a sole basis for treatment. Nasal washings and aspirates are unacceptable for Xpert Xpress SARS-CoV-2/FLU/RSV testing.  Fact Sheet for Patients: BloggerCourse.com  Fact Sheet for Healthcare  Providers: SeriousBroker.it  This test is not yet approved or cleared by the [place] FDA and has been authorized for detection and/or diagnosis of SARS-CoV-2 by FDA under an Emergency Use Authorization (EUA). This EUA will remain in effect (meaning this test can be used) for the duration of the COVID-19 declaration under Section 564(b)(1) of the Act, 21 U.S.C. section 360bbb-3(b)(1), unless the authorization is terminated or revoked.     Resp Syncytial Virus by PCR NEGATIVE NEGATIVE Final    Comment: (NOTE) Fact Sheet for Patients: BloggerCourse.com  Fact Sheet for Healthcare Providers: SeriousBroker.it  This test is not yet approved or cleared by the [place] FDA and has been authorized for detection and/or diagnosis of SARS-CoV-2  by FDA under an Emergency Use Authorization (EUA). This EUA will remain in effect (meaning this test can be used) for the duration of the COVID-19 declaration under Section 564(b)(1) of the Act, 21 U.S.C. section 360bbb-3(b)(1), unless the authorization is terminated or revoked.  Performed at [facility], [address] [place], [place] [address]     Antimicrobials/Microbiology: Anti-infectives (From admission, onward)    None         Component Value Date/Time   SDES  01/17/2023 1527    BLOOD LEFT ANTECUBITAL Performed at [facility], [address] [place], [place] [address]    [facility]  01/17/2023 1527    BOTTLES DRAWN AEROBIC AND ANAEROBIC Blood Culture results may not be optimal due to an inadequate volume of blood received in culture bottles Performed at [facility], [address], [place], [place] [address]    CULT  01/17/2023 1527    NO GROWTH 5 DAYS Performed at [facility], [address] [place], [place] [address]    REPTSTATUS 01/22/2023 FINAL 01/17/2023 1527     Radiology Studies: DG  Chest 2 View Result Date: 03/18/2023 CLINICAL DATA:  Short of breath EXAM: CHEST - 2 VIEW COMPARISON:  01/17/2023 FINDINGS: Frontal and lateral views of the chest are obtained on 3 images. The cardiac silhouette is enlarged but stable. There is persistent pulmonary vascular congestion, with no airspace disease, effusion, or pneumothorax. No acute bony abnormalities. IMPRESSION: 1. Stable enlarged cardiac silhouette and chronic pulmonary vascular congestion. No overt edema. Electronically Signed   By: Sharlet Salina M.D.   On: 03/18/2023 17:19     LOS: 0 days   Total time spent in review of labs and imaging, patient evaluation, formulation of plan, documentation and communication with family: 35 minutes  Lanae Boast, MD  [facility]  03/20/2023, 10:17 AM

## 2023-03-20 NOTE — Progress Notes (Signed)
OT Cancellation Note  Patient Details Name: Justin Molina. MRN: [id number] DOB: Apr 11, 1960   Cancelled Treatment:    Reason Eval/Treat Not Completed: Other (comment) Patient is working with PT at this time. OT to continue to follow and check back on 2/13.  Rosalio Loud, MS Acute Rehabilitation Department Office# [phone]  03/20/2023, 2:34 PM

## 2023-03-20 NOTE — Hospital Course (Addendum)
63 y.o. male with medical history significant for CKD 3A B/L REAT ~ 1.5 BPH, class II obesity, constipation, type 2 diabetes, diabetic retinopathy, hyperlipidemia, hypertension, OSA on CPAP, wheelchair-bound, history of CVA with residual right-sided hemiparesis, aphasia, anxiety, atrial fibrillation on apixaban, coronary arthrosclerosis, heart failure with preserved ejection fraction who presented to the ED with complaints of progressively worse dyspnea, occasionally productive cough, wheezing and lower extremity edema since Saturday 03/16/23. IN ED: Initial vital signs were temperature 97.8 F, pulse 70, respiration 20, BP 142/94 mmHg O2 sat 94% on room air. Lab work: CBC showed a white count 5.3, hemoglobin 11.2 g/dL platelets 161.  BMP showed BUN of 33, creatinine 2.04 and calcium of 8.2 mg/dL.  Glucose and the rest of the electrolytes were normal.  BNP was 582.3 pg/mL.  Coronavirus, influenza and RSV PCR was negative. Imaging: 2 view chest radiograph showing a stable enlarged cardiac silhouette and chronic pulmonary vascular congestion.  No overt edema.The patient received furosemide 40 mg IVP and KCl 40 mill equivalents p.o. x 1.  Patient is admitted for further management Patient was given IV Lasix, with improvement in the swelling, seen by wound care wound care instruction provided and patient will continue wound care at home and follow-up outpatient.  At this time renal function remains stable air lites improved ambulate with PT OT and planning for home health upon discharge.  He is medically stable for discharge.

## 2023-03-20 NOTE — Care Management Obs Status (Signed)
MEDICARE OBSERVATION STATUS NOTIFICATION   Patient Details  Name: Justin Molina. MRN: [id number] Date of Birth: Mar 30, 1960   Medicare Observation Status Notification Given:  Yes    Larrie Kass, LCSW 03/20/2023, 12:21 PM

## 2023-03-20 NOTE — Progress Notes (Signed)
Heart Failure Navigator Progress Note  Assessed for Heart & Vascular TOC clinic readiness.  Patient does not meet criteria due to EF 60-65 %, wheel chair bound, has a scheduled [facility] appointment on 04/16/2023. .   Navigator will sign off at this time.   Rhae Hammock, BSN, Scientist, clinical (histocompatibility and immunogenetics) Only

## 2023-03-21 DIAGNOSIS — I5033 Acute on chronic diastolic (congestive) heart failure: Secondary | ICD-10-CM | POA: Diagnosis not present

## 2023-03-21 LAB — CBC
HCT: 40.5 % (ref 39.0–52.0)
Hemoglobin: 12.5 g/dL — ABNORMAL LOW (ref 13.0–17.0)
MCH: 26.1 pg (ref 26.0–34.0)
MCHC: 30.9 g/dL (ref 30.0–36.0)
MCV: 84.6 fL (ref 80.0–100.0)
Platelets: 223 10*3/uL (ref 150–400)
RBC: 4.79 MIL/uL (ref 4.22–5.81)
RDW: 17.1 % — ABNORMAL HIGH (ref 11.5–15.5)
WBC: 5.4 10*3/uL (ref 4.0–10.5)
nRBC: 0 % (ref 0.0–0.2)

## 2023-03-21 LAB — BASIC METABOLIC PANEL
Anion gap: 10 (ref 5–15)
BUN: 20 mg/dL (ref 8–23)
CO2: 28 mmol/L (ref 22–32)
Calcium: 8.4 mg/dL — ABNORMAL LOW (ref 8.9–10.3)
Chloride: 109 mmol/L (ref 98–111)
Creatinine, Ser: 1.59 mg/dL — ABNORMAL HIGH (ref 0.61–1.24)
GFR, Estimated: 49 mL/min — ABNORMAL LOW (ref >60)
Glucose, Bld: 85 mg/dL (ref 70–99)
Potassium: 3.3 mmol/L — ABNORMAL LOW (ref 3.5–5.1)
Sodium: 147 mmol/L — ABNORMAL HIGH (ref 135–145)

## 2023-03-21 LAB — GLUCOSE, CAPILLARY
Glucose-Capillary: 97 mg/dL (ref 70–99)
Glucose-Capillary: 76 mg/dL (ref 70–99)
Glucose-Capillary: 108 mg/dL — ABNORMAL HIGH (ref 70–99)

## 2023-03-21 MED ORDER — POTASSIUM CHLORIDE CRYS ER 20 MEQ PO TBCR
20.0000 meq | EXTENDED_RELEASE_TABLET | Freq: Once | ORAL | Status: AC
  Administered 2023-03-21: 20 meq via ORAL
  Filled 2023-03-21: qty 1

## 2023-03-21 NOTE — Progress Notes (Signed)
OT Cancellation Note  Patient Details Name: Justin Molina. MRN: [id number] DOB: 07-Jun-1960   Cancelled Treatment:    Reason Eval/Treat Not Completed: Other (comment): OT attmpted evaluation this AM. Pt's wife came out og the bathroom HIGHLY agitated. She said pt was soiled and declined OT for now.  I offered my help. She asked the same questions over and over and I attempted to clarify with each answer. She seemed more agitated and I offered a different therapist. She said, "No! Do your job!" But she would not allow me to work with the pt so I left the room for safety and to deescalate her.  RN notified.  Will continue efforts.  Theodoro Clock 03/21/2023, 8:51 AM

## 2023-03-21 NOTE — Progress Notes (Signed)
Discharge instructions reviewed with patient and spouse. All questions answered. All belongings accounted for. Patient to follow up with MD in  1 weeks. Medications reviewed Patient's spouse to contact [facility] for home health follow up.  Marland Kitchen PIV removed. Assisted via WC to private vehicle.

## 2023-03-21 NOTE — Progress Notes (Signed)
Orthopedic Tech Progress Note Patient Details:  Justin Molina. 09/17/1960 [id number]  Ortho Devices Type of Ortho Device: Roland Rack boot Ortho Device/Splint Location: right Ortho Device/Splint Interventions: Ordered, Application, Adjustment   Post Interventions Patient Tolerated: Well Instructions Provided: Adjustment of device, Care of device  Kizzie Fantasia 03/21/2023, 12:32 PM

## 2023-03-21 NOTE — Evaluation (Signed)
Occupational Therapy Evaluation Patient Details Name: Justin Molina. MRN: [id number] DOB: Jul 14, 1960 Today's Date: 03/21/2023   History of Present Illness   Justin Molina. is a 63 y.o. male presents with SOB; admitted with Acute on chronic heart failure with preserved ejection fraction. 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 Pt standing from recliner to sink and recliner to hemi walker both with CGA and ambulated 145' today with hemi walker and close supervision and up to Min As needed with turns.   Current level of function is below patient's typical baseline.  During this evaluation, patient was limited by impaired activity tolerance, and residual RT UE hemiplegia and RT LE hemiparesis from a previous CVA with expressive aphasia as well.    Pt seems close to baseline except for fatigue and elevated HR with ambulation up to 117.  Patient lives at home, with his spouse.   Patient demonstrates good rehab potential, and should benefit from continued skilled occupational therapy services while in acute care to maximize safety, independence and quality of life at home.  Continued occupational therapy services at home are recommended.  ?      If plan is discharge home, recommend the following:   A little help with walking and/or transfers;A little help with bathing/dressing/bathroom;Assistance with cooking/housework;Assist for transportation;Assistance with feeding     Functional Status Assessment   Patient has had a recent decline in their functional status and demonstrates the ability to make significant improvements in function in a reasonable and predictable amount of time.     Equipment Recommendations   None  recommended by OT     Recommendations for Other Services         Precautions/Restrictions   Precautions Precautions: Fall Precaution/Restrictions Comments: R hemiparesis Required Braces or Orthoses: Other Brace Other Brace: RT LE AFO Restrictions Weight Bearing Restrictions Per Provider Order: No     Mobility Bed Mobility               General bed mobility comments: Pt recevied in recliner. Patient Response: Cooperative  Transfers                   General transfer comment: Pt stood from recliner to hemiwalker with CGA. Pt ambulated 145' with close supervision except for turning. Pt turned and was startled by people behind him with slight LOB and Min As to correct.      Balance Overall balance assessment: Needs assistance         Standing balance support: Reliant on assistive device for balance, During functional activity, Single extremity supported Standing balance-Leahy Scale: Fair Standing balance comment: static able to stand wtihout UE support, dynamic with hemiwalker                           ADL either performed or assessed with clinical judgement   ADL Overall ADL's : Needs assistance/impaired Eating/Feeding: Minimal assistance;Sitting   Grooming: Standing;Oral care;Wash/dry face;Contact guard assist;Cueing for compensatory techniques Grooming Details (indicate cue type and reason): Pt positioned in recliner facing sink. Pt able to use LT UE to pull self up with CGA. (AFO/shoes donned). Pt shown 1-handed technique for oral care and demonstrated back correctly. Pt stood and completed oral care  and washing face with CGA only. Upper Body Bathing: Minimal assistance Upper Body Bathing Details (indicate cue type and reason): OT assisted with washing pt's hands. Pt stated "yes" when asked if his hands were washed and checked daily. Lower Body Bathing: Minimal assistance;Sitting/lateral leans;Sit to/from stand   Upper Body Dressing :  Minimal assistance;Sitting   Lower Body Dressing: Set up;Minimal assistance;Sitting/lateral leans Lower Body Dressing Details (indicate cue type and reason): Pt able to demo doffing and donning sock to LLE with supervision. Pt donned ER AFO with shoe with Min As and LT shoe with setup.   Toilet Transfer Details (indicate cue type and reason): See Mobility as pt requested ambulation after grooming. Toileting- Clothing Manipulation and Hygiene: Set up;Minimal assistance;Contact guard assist;Sit to/from stand Toileting - Clothing Manipulation Details (indicate cue type and reason): While standing at sink, pt gestured to urinal. This was handed to him and OT assisted with managing hospital gown while pt able to negotiate urinal without assit.     Functional mobility during ADLs: Contact guard assist (hemiwalker)       Vision   Vision Assessment?: No apparent visual deficits     Perception         Praxis         Pertinent Vitals/Pain Pain Assessment Pain Assessment: No/denies pain     Extremity/Trunk Assessment Upper Extremity Assessment Upper Extremity Assessment: Right hand dominant;RUE deficits/detail (Post-CVA LT hand dominant due to necessity.) RUE Deficits / Details: Flaccid with ~1/2" subluxation palpated. Pt educated on use of either bedside table or pillows to support arm. Hand with increased flexor tone and shoulder in IR with no active upper trap or rhomboid activation to RT. Pt reports he wears some form of RT UE splint at home but does not have it here. RUE: Subluxation noted RUE Sensation: decreased light touch;decreased proprioception RUE Coordination: decreased fine motor;decreased gross motor   Lower Extremity Assessment Lower Extremity Assessment: RLE deficits/detail;LLE deficits/detail;Defer to PT evaluation RLE Deficits / Details: foot drop noted, knee extension 2-/5, hip flexion 2-/5 LLE Deficits / Details: AROM WFL, strength grossly 4+/5   Cervical / Trunk  Assessment Cervical / Trunk Assessment: Normal   Communication Communication Communication: Impaired (Expressive aphasia. Reception intact.) Factors Affecting Communication: Difficulty expressing self;Reduced clarity of speech   Cognition Arousal: Alert Behavior During Therapy: WFL for tasks assessed/performed Cognition: No apparent impairments             OT - Cognition Comments: Pt does very well using available speech and gestures to communicate wants and needs. With incraesed effort pt able to say his full name and DOB, dysarthric but understood by OT.                 Following commands: Intact       Cueing  General Comments   Cueing Techniques: Visual cues;Verbal cues      Exercises     Shoulder Instructions      Home Living Family/patient expects to be discharged to:: Private residence Living Arrangements: Spouse/significant other Available Help at Discharge: Family Type of Home: Apartment Home Access: Level entry     Home Layout: One level     Bathroom Shower/Tub: Chief Strategy Officer: Handicapped height     Home Equipment: Tub bench;Wheelchair - manual;BSC/3in1;Wheelchair - power;Hospital bed;Other (comment) (hemiwalker)   Additional Comments: All PLOF and home information taken from previous Evaluations. Pt's spouse was present in AM with 1st attempt, however spouse was highly agitated and OT had  to exit room for safety. OT coordinated with Nursing to return after spouse had gone for morning.      Prior Functioning/Environment Prior Level of Function : Needs assist             Mobility Comments: The pt used a RLE AFO and hemi-walker for household ambulation. He has been receiving home health OT and PT services. - taken from previous admission; this admission pt states "yes" when asked about ambulating, states walks "there and back", nods head yes to hemi walker use ADLs Comments: Per PT Eval: "The pt's spouse assisted with  providing information regarding the pt's prior level of functioning and living situation, due to the pt's expressive aphasia. The pt lives with his spouse in an apartment, he fed himself without assist using his L UE, he required increased time and effort for dressing though he could typically complete himself, his spouse provided light assistance for tub transfers, the pt used a urinal for voiding urine which he could empty independently, and he managed toileting without assist though his spouse stated he had occasional difficulty with thoroughness after having a bowel movement. The pt did not cook on the stove, however could warm food in the microwave, as well as fix simple things like a bowel of cereal or sandwich. He does not drive. - taken from previous admission, spouse not present on eval".    OT Problem List: Obesity;Decreased activity tolerance;Impaired UE functional use;Cardiopulmonary status limiting activity;Impaired sensation;Decreased coordination;Impaired tone   OT Treatment/Interventions: Self-care/ADL training;Therapeutic activities;Neuromuscular education;Energy conservation;Patient/family education;Balance training;DME and/or AE instruction;Manual therapy      OT Goals(Current goals can be found in the care plan section)   Acute Rehab OT Goals Patient Stated Goal: "Home?" OT Goal Formulation: With patient Time For Goal Achievement: 04/04/23 Potential to Achieve Goals: Good ADL Goals Pt Will Perform Grooming: standing;with set-up;with supervision Pt Will Transfer to Toilet: ambulating;with supervision Pt Will Perform Toileting - Clothing Manipulation and hygiene: with modified independence;with adaptive equipment;sitting/lateral leans;sit to/from stand;with caregiver independent in assisting Additional ADL Goal #1: Patient will identify at least 3 energy conservation strategies to employ at home in order to maximize function and quality of life and decrease caregiver burden  while preventing exacerbation of symptoms and rehospitalization. Additional ADL Goal #2: Pt will demonstrate understanding of proper positioning of RUE at rest and during activity in order to prevent progress of subluxation and subsequent pain or injury.   OT Frequency:  Min 1X/week    Co-evaluation              AM-PAC OT "6 Clicks" Daily Activity     Outcome Measure Help from another person eating meals?: A Little Help from another person taking care of personal grooming?: A Little Help from another person toileting, which includes using toliet, bedpan, or urinal?: A Little Help from another person bathing (including washing, rinsing, drying)?: A Little Help from another person to put on and taking off regular upper body clothing?: A Little Help from another person to put on and taking off regular lower body clothing?: A Little 6 Click Score: 18   End of Session Equipment Utilized During Treatment: Gait belt;Other (comment) (Hemiwalker) Nurse Communication: Mobility status;Other (comment) (Coordinated for timing of OT visit)  Activity Tolerance: Patient tolerated treatment well Patient left: in chair;with call bell/phone within reach  OT Visit Diagnosis: Hemiplegia and hemiparesis (decreased ADLs) Hemiplegia - Right/Left: Right Hemiplegia - dominant/non-dominant: Dominant Hemiplegia - caused by: Cerebral infarction  Time: 1050-1131 OT Time Calculation (min): 41 min Charges:  OT General Charges $OT Visit: 1 Visit OT Evaluation $OT Eval Low Complexity: 1 Low OT Treatments $Self Care/Home Management : 8-22 mins $Therapeutic Activity: 8-22 mins  Victorino Dike, OT Acute Rehab Services Office: [phone] 03/21/2023   Theodoro Clock 03/21/2023, 11:58 AM

## 2023-03-21 NOTE — Progress Notes (Signed)
   03/20/23 2300  BiPAP/CPAP/SIPAP  $ Non-Invasive Home Ventilator  Subsequent  BiPAP/CPAP/SIPAP Pt Type Adult  BiPAP/CPAP/SIPAP DREAMSTATIOND  Mask Type Full face mask  Dentures removed? Not applicable  Mask Size Large  Respiratory Rate 19 breaths/min  FiO2 (%) 21 %  Patient Home Equipment No  Auto Titrate Yes  CPAP/SIPAP surface wiped down Yes  BiPAP/CPAP /SiPAP Vitals  Pulse Rate 99  BP (!) 140/92  SpO2 98 %  Bilateral Breath Sounds Clear;Diminished  MEWS Score/Color  MEWS Score 0  MEWS Score Color Green

## 2023-03-21 NOTE — Discharge Summary (Signed)
Physician Discharge Summary  Justin Molina. WGN:[id number] DOB: 12/08/1960 DOA: 03/18/2023  PCP: Theodis Shove, DO  Admit date: 03/18/2023 Discharge date: 03/21/2023 Recommendations for Outpatient Follow-up:  Follow up with PCP in 1 weeks-call for appointment Please obtain BMP/CBC in one week. Outpatient sleep apnea evaluation  Discharge Dispo: HOME W/ [facility] Discharge Condition: Stable Code Status:   Code Status: Full Code Diet recommendation:  Diet Order             Diet - low sodium heart healthy           Diet heart healthy/carb modified Room service appropriate? Yes; Fluid consistency: Thin; Fluid restriction: 1200 mL Fluid  Diet effective now                    Brief/Interim Summary: 63 y.o. male with medical history significant for CKD 3A B/L REAT ~ 1.5 BPH, class II obesity, constipation, type 2 diabetes, diabetic retinopathy, hyperlipidemia, hypertension, OSA on CPAP, wheelchair-bound, history of CVA with residual right-sided hemiparesis, aphasia, anxiety, atrial fibrillation on apixaban, coronary arthrosclerosis, heart failure with preserved ejection fraction who presented to the ED with complaints of progressively worse dyspnea, occasionally productive cough, wheezing and lower extremity edema since Saturday 03/16/23. IN ED: Initial vital signs were temperature 97.8 F, pulse 70, respiration 20, BP 142/94 mmHg O2 sat 94% on room air. Lab work: CBC showed a white count 5.3, hemoglobin 11.2 g/dL platelets 784.  BMP showed BUN of 33, creatinine 2.04 and calcium of 8.2 mg/dL.  Glucose and the rest of the electrolytes were normal.  BNP was 582.3 pg/mL.  Coronavirus, influenza and RSV PCR was negative. Imaging: 2 view chest radiograph showing a stable enlarged cardiac silhouette and chronic pulmonary vascular congestion.  No overt edema.The patient received furosemide 40 mg IVP and KCl 40 mill equivalents p.o. x 1.  Patient is admitted for further management Patient was  given IV Lasix, with improvement in the swelling, seen by wound care wound care instruction provided and patient will continue wound care at home and follow-up outpatient.  At this time renal function remains stable air lites improved ambulate with PT OT and planning for home health upon discharge.  He is medically stable for discharge.   Discharge Diagnoses:  Principal Problem:   Acute on chronic heart failure with preserved ejection fraction (HFpEF) (HCC) Active Problems:   DM (diabetes mellitus), type 2 with renal complications (HCC)   Diabetic neuropathy (HCC)   HTN (hypertension)   History of CVA with residual deficit   CKD (chronic kidney disease) stage 3, GFR 30-59 ml/min (HCC)   HLD (hyperlipidemia)   OSA (obstructive sleep apnea)   Unspecified atrial fibrillation (HCC)   Benign prostatic hyperplasia   Coronary atherosclerosis   Constipation   Hypokalemia   Class 2 obesity   Open wound of right lower extremity   Migraine headaches  Acute on chronic CHF with preserved EF: Patient presenting with dyspnea, leg edema, BNP elevated at 500.  Influenza COVID RSV negative.  Chest x-ray no pneumonia but chronic pulmonary vascular congestion.  Managed with IV Lasix overall clinically improved no more leg swelling.,  Advised to follow-up with PCP monitor weight at home avoid excessive salt and continue fluid restriction.  Echocardiogram not repeated as patient had one in 01/19/23-EF 60-65%, no RWMA mild LVH  Hypokalemia CKD 3B: Potassium replaced, creatinine stable at 1.5  Recent Labs    01/17/23 1527 01/18/23 0513 01/19/23 0509 01/20/23 0911 01/23/23 0523 01/25/23 0513 03/18/23  1543 03/19/23 0809 03/20/23 0411 03/21/23 0418  BUN 22 18 16 18 21  25* 33* 28* 23 20  CREATININE 2.17* 1.88* 1.66* 1.53* 1.80* 1.95* 2.04* 1.78* 1.50* 1.59*  CO2 25 24 25 25 25 28 23 25 25  28  K 3.3* 3.2* 3.1* 3.6 3.4* 3.3* 3.5 3.0* 3.0* 3.3*    Mild chronic anemia: Hemoglobin stable  Type 2  diabetes mellitus with CKD and neuropathy: Blood sugar well-controlled.  Resume home meds upon discharge   Essential hypertension Coronary atherosclerosis HLD: BP is well-controlled.  PTA on amlodipine Lasix hydralazine lisinopril  -resume meds upon discharge   History of CVA with residual deficit: Continue supportive care PT OT eval  History of A-fib: Patient on Coreg, Multaq and Eliquis and will be continued monitor heart rate  Constipation: Continue laxatives  BPH: Continue Cardura  ?OSA: Advised to follow-up PCP and get a sleep apnea evaluation.  I was able to refer her to sleep apnea clinic  Migraine headache: Continue meds as needed  Open wound of right lower extremity: Wound care consulted, he is followed by wound care clinic-continue wound care follow-up with wound care clinic  Obesity: Patient's Body mass index is 36.56 kg/m. : Will benefit with PCP follow-up, weight loss  healthy lifestyle and outpatient sleep evaluation-patient and wife are aware that they need to get PCP for sleep referral.   Consults: wocn Subjective: Alert awake, ambulating with staff this morning on room air, eager to go home today feels well.  Discharge Exam: Vitals:   03/21/23 0619 03/21/23 1133  BP: (!) 147/111   Pulse:  (!) 117  Resp: 18   Temp: 98.5 F (36.9 C)   SpO2: 96% 97%   General: Pt is alert, awake, not in acute distress Cardiovascular: RRR, S1/S2 +, no rubs, no gallops Respiratory: CTA bilaterally, no wheezing, no rhonchi Abdominal: Soft, NT, ND, bowel sounds + Extremities: no edema, no cyanosis, baseline weakness on 1 side  Discharge Instructions  Discharge Instructions     Ambulatory referral to Sleep Studies   Complete by: As directed    Diet - low sodium heart healthy   Complete by: As directed    Discharge instructions   Complete by: As directed    Please call call MD or return to ER for similar or worsening recurring problem that brought you to  hospital or if any fever,nausea/vomiting,abdominal pain, uncontrolled pain, chest pain,  shortness of breath or any other alarming symptoms.  Please follow-up your doctor as instructed in a week time and call the office for appointment.  Please avoid alcohol, smoking, or any other illicit substance and maintain healthy habits including taking your regular medications as prescribed.  You were cared for by a hospitalist during your hospital stay. If you have any questions about your discharge medications or the care you received while you were in the hospital after you are discharged, you can call the unit and ask to speak with the hospitalist on call if the hospitalist that took care of you is not available.  Once you are discharged, your primary care physician will handle any further medical issues. Please note that NO REFILLS for any discharge medications will be authorized once you are discharged, as it is imperative that you return to your primary care physician (or establish a relationship with a primary care physician if you do not have one) for your aftercare needs so that they can reassess your need for medications and monitor your lab values   Discharge  wound care:   Complete by: As directed    Clean R lower leg wound with NS, apply silver hydrofiber to wound bed Monday and Thursday prior to ortho tech applying unna boot. May secure silver to wound using silicone foam prior to application of unna boot.  MOISTEN SILVER WITH NS IF STUCK TO WOUND BED FOR ATRAUMATIC REMOVAL   Face-to-face encounter (required for Medicare/Medicaid patients)   Complete by: As directed    I Lanae Boast certify that this patient is under my care and that I, or a nurse practitioner or physician's assistant working with me, had a face-to-face encounter that meets the physician face-to-face encounter requirements with this patient on 03/21/2023. The encounter with the patient was in whole, or in part for the following  medical condition(s) which is the primary reason for home health care (List medical condition): chf, weakness   The encounter with the patient was in whole, or in part, for the following medical condition, which is the primary reason for home health care: chf, weakness   I certify that, based on my findings, the following services are medically necessary home health services: Physical therapy   Reason for Medically Necessary Home Health Services: Therapy- Therapeutic Exercises to Increase Strength and Endurance   My clinical findings support the need for the above services: Unable to leave home safely without assistance and/or assistive device   Further, I certify that my clinical findings support that this patient is homebound due to: Unable to leave home safely without assistance   Home Health   Complete by: As directed    To provide the following care/treatments:  PT OT     Increase activity slowly   Complete by: As directed       Allergies as of 03/21/2023       Reactions   Contrast Media [iodinated Contrast Media] Shortness Of Breath, Nausea And Vomiting, Swelling, Other (See Comments)   Facial swelling and felt flushed, too   Iodine Shortness Of Breath, Nausea And Vomiting, Swelling, Other (See Comments)   Facial swelling and felt flushed, too        Medication List     STOP taking these medications    lisinopril 20 MG tablet Commonly known as: ZESTRIL       TAKE these medications    Accu-Chek Aviva Plus w/Device Kit Check blood sugar TID  & QHS   acetaminophen 500 MG tablet Commonly known as: TYLENOL Take 1 tablet (500 mg total) by mouth every 6 (six) hours as needed.   albuterol 108 (90 Base) MCG/ACT inhaler Commonly known as: VENTOLIN HFA Inhale 2 puffs into the lungs every 4 (four) hours as needed for wheezing or shortness of breath.   amLODipine 5 MG tablet Commonly known as: NORVASC Take 1 tablet (5 mg total) by mouth daily. What changed: Another  medication with the same name was removed. Continue taking this medication, and follow the directions you see here.   atorvastatin 80 MG tablet Commonly known as: LIPITOR Take 1 tablet (80 mg total) by mouth daily. What changed: when to take this   [facility] Misc Patient needs safety rails for shower   blood glucose meter kit and supplies Kit Dispense based on patient and insurance preference. Use up to four times daily as directed.   Blood Pressure Cuff Misc 1 Units by Does not apply route daily.   carvedilol 25 MG tablet Commonly known as: COREG Take 1 tablet (25 mg total) by mouth 2 (  two) times daily with a meal. Patient needs office visit for more refills   doxazosin 8 MG tablet Commonly known as: CARDURA Take 1 tablet (8 mg total) by mouth daily. What changed: when to take this   Eliquis 5 MG Tabs tablet Generic drug: apixaban Take 1 tablet (5 mg total) by mouth 2 (two) times daily.   fluticasone 50 MCG/ACT nasal spray Commonly known as: FLONASE Place 2 sprays into both nostrils daily as needed for allergies or rhinitis.   furosemide 40 MG tablet Commonly known as: LASIX Take 40 mg by mouth daily.   glucose blood test strip Commonly known as: Accu-Chek Aviva Plus Use as instructed for TID and QHS blood glucose testing   hydrALAZINE 50 MG tablet Commonly known as: APRESOLINE Take 3 tablets (150 mg total) by mouth 3 (three) times daily. What changed:  how much to take when to take this   isosorbide mononitrate 120 MG 24 hr tablet Commonly known as: IMDUR Take 1 tablet (120 mg total) by mouth daily.   Jardiance 10 MG Tabs tablet Generic drug: empagliflozin Take 10 mg by mouth daily.   Lancets 28G Misc Check blood sugar TID & QHS   Lantus SoloStar 100 UNIT/ML Solostar Pen Generic drug: insulin glargine Inject 15 Units into the skin at bedtime as needed (for an elevated BGL).   loratadine 10 MG tablet Commonly known as: CLARITIN Take 10 mg by  mouth daily as needed for allergies or rhinitis.   Multaq 400 MG tablet Generic drug: dronedarone Take 1 tablet (400 mg total) by mouth 2 (two) times daily.   nystatin cream Commonly known as: MYCOSTATIN Apply 1 Application topically See admin instructions. Apply a thin layer to affected areas 2 times a day for redness   Ozempic (0.25 or 0.5 MG/DOSE) 2 MG/3ML Sopn Generic drug: Semaglutide(0.25 or 0.5MG /DOS) Inject 0.25 mg into the skin every Sunday.   potassium chloride 10 MEQ tablet Commonly known as: KLOR-CON M Take 1 tablet (10 mEq total) by mouth daily.               Discharge Care Instructions  (From admission, onward)           Start     Ordered   03/21/23 0000  Discharge wound care:       Comments: Clean R lower leg wound with NS, apply silver hydrofiber to wound bed Monday and Thursday prior to ortho tech applying unna boot. May secure silver to wound using silicone foam prior to application of unna boot.  MOISTEN SILVER WITH NS IF STUCK TO WOUND BED FOR ATRAUMATIC REMOVAL   03/21/23 1155            Follow-up Information     Combs, Prince Solian, DO Follow up in 1 week(s).   Specialty: Geriatric Medicine Contact information: [address] [place] [address] [phone]                Allergies  Allergen Reactions   Contrast Media [Iodinated Contrast Media] Shortness Of Breath, Nausea And Vomiting, Swelling and Other (See Comments)    Facial swelling and felt flushed, too   Iodine Shortness Of Breath, Nausea And Vomiting, Swelling and Other (See Comments)    Facial swelling and felt flushed, too    The results of significant diagnostics from this hospitalization (including imaging, microbiology, ancillary and laboratory) are listed below for reference.    Microbiology: Recent Results (from the past 240 hours)  Resp panel by RT-PCR (RSV,  Flu A&B, Covid) Anterior Nasal Swab     Status: None   Collection Time: 03/18/23  8:16 PM    Specimen: Anterior Nasal Swab  Result Value Ref Range Status   SARS Coronavirus 2 by RT PCR NEGATIVE NEGATIVE Final    Comment: (NOTE) SARS-CoV-2 target nucleic acids are NOT DETECTED.  The SARS-CoV-2 RNA is generally detectable in upper respiratory specimens during the acute phase of infection. The lowest concentration of SARS-CoV-2 viral copies this assay can detect is 138 copies/mL. A negative result does not preclude SARS-Cov-2 infection and should not be used as the sole basis for treatment or other patient management decisions. A negative result may occur with  improper specimen collection/handling, submission of specimen other than nasopharyngeal swab, presence of viral mutation(s) within the areas targeted by this assay, and inadequate number of viral copies(<138 copies/mL). A negative result must be combined with clinical observations, patient history, and epidemiological information. The expected result is Negative.  Fact Sheet for Patients:  BloggerCourse.com  Fact Sheet for Healthcare Providers:  SeriousBroker.it  This test is no t yet approved or cleared by the [place] FDA and  has been authorized for detection and/or diagnosis of SARS-CoV-2 by FDA under an Emergency Use Authorization (EUA). This EUA will remain  in effect (meaning this test can be used) for the duration of the COVID-19 declaration under Section 564(b)(1) of the Act, 21 U.S.C.section 360bbb-3(b)(1), unless the authorization is terminated  or revoked sooner.       Influenza A by PCR NEGATIVE NEGATIVE Final   Influenza B by PCR NEGATIVE NEGATIVE Final    Comment: (NOTE) The Xpert Xpress SARS-CoV-2/FLU/RSV plus assay is intended as an aid in the diagnosis of influenza from Nasopharyngeal swab specimens and should not be used as a sole basis for treatment. Nasal washings and aspirates are unacceptable for Xpert Xpress  SARS-CoV-2/FLU/RSV testing.  Fact Sheet for Patients: BloggerCourse.com  Fact Sheet for Healthcare Providers: SeriousBroker.it  This test is not yet approved or cleared by the [place] FDA and has been authorized for detection and/or diagnosis of SARS-CoV-2 by FDA under an Emergency Use Authorization (EUA). This EUA will remain in effect (meaning this test can be used) for the duration of the COVID-19 declaration under Section 564(b)(1) of the Act, 21 U.S.C. section 360bbb-3(b)(1), unless the authorization is terminated or revoked.     Resp Syncytial Virus by PCR NEGATIVE NEGATIVE Final    Comment: (NOTE) Fact Sheet for Patients: BloggerCourse.com  Fact Sheet for Healthcare Providers: SeriousBroker.it  This test is not yet approved or cleared by the [place] FDA and has been authorized for detection and/or diagnosis of SARS-CoV-2 by FDA under an Emergency Use Authorization (EUA). This EUA will remain in effect (meaning this test can be used) for the duration of the COVID-19 declaration under Section 564(b)(1) of the Act, 21 U.S.C. section 360bbb-3(b)(1), unless the authorization is terminated or revoked.  Performed at [facility], [address] [place], [place] [address]     Procedures/Studies: DG Chest 2 View Result Date: 03/18/2023 CLINICAL DATA:  Short of breath EXAM: CHEST - 2 VIEW COMPARISON:  01/17/2023 FINDINGS: Frontal and lateral views of the chest are obtained on 3 images. The cardiac silhouette is enlarged but stable. There is persistent pulmonary vascular congestion, with no airspace disease, effusion, or pneumothorax. No acute bony abnormalities. IMPRESSION: 1. Stable enlarged cardiac silhouette and chronic pulmonary vascular congestion. No overt edema. Electronically Signed   By: Sharlet Salina  M.D.   On: 03/18/2023 17:19    Labs: BNP  (last 3 results) Recent Labs    01/08/23 1031 03/18/23 1543  BNP 326.3* 582.3*   Basic Metabolic Panel: Recent Labs  Lab 03/18/23 1543 03/19/23 0809 03/20/23 0411 03/21/23 0418  NA 141 141 141 147*  K 3.5 3.0* 3.0* 3.3*  CL 109 110 107 109  CO2 23 25 25 28   GLUCOSE 90 83 73 85  BUN 33* 28* 23 20  CREATININE 2.04* 1.78* 1.50* 1.59*  CALCIUM 8.2* 7.9* 7.8* 8.4*   Liver Function Tests: Recent Labs  Lab 03/20/23 0411  AST 16  ALT 17  ALKPHOS 74  BILITOT 1.1  PROT 7.6  ALBUMIN 3.2*   No results for input(s): "LIPASE", "AMYLASE" in the last 168 hours. No results for input(s): "AMMONIA" in the last 168 hours. CBC: Recent Labs  Lab 03/18/23 1543 03/20/23 0411 03/21/23 0418  WBC 5.3 5.6 5.4  HGB 11.2* 11.2* 12.5*  HCT 34.9* 36.2* 40.5  MCV 81.5 83.6 84.6  PLT 196 210 223   Cardiac Enzymes: No results for input(s): "CKTOTAL", "CKMB", "CKMBINDEX", "TROPONINI" in the last 168 hours. BNP: Invalid input(s): "POCBNP" CBG: Recent Labs  Lab 03/20/23 1143 03/20/23 1723 03/20/23 2125 03/21/23 0149 03/21/23 0811  GLUCAP 113* 82 122* 97 76   D-Dimer No results for input(s): "DDIMER" in the last 72 hours. Hgb A1c No results for input(s): "HGBA1C" in the last 72 hours. Lipid Profile No results for input(s): "CHOL", "HDL", "LDLCALC", "TRIG", "CHOLHDL", "LDLDIRECT" in the last 72 hours. Thyroid function studies No results for input(s): "TSH", "T4TOTAL", "T3FREE", "THYROIDAB" in the last 72 hours.  Invalid input(s): "FREET3" Anemia work up No results for input(s): "VITAMINB12", "FOLATE", "FERRITIN", "TIBC", "IRON", "RETICCTPCT" in the last 72 hours. Urinalysis    Component Value Date/Time   COLORURINE YELLOW 01/17/2023 1527   APPEARANCEUR CLEAR 01/17/2023 1527   LABSPEC 1.010 01/17/2023 1527   PHURINE 6.0 01/17/2023 1527   GLUCOSEU >=500 (A) 01/17/2023 1527   HGBUR LARGE (A) 01/17/2023 1527   BILIRUBINUR NEGATIVE 01/17/2023 1527   BILIRUBINUR neg 08/12/2014  1805   KETONESUR NEGATIVE 01/17/2023 1527   PROTEINUR 100 (A) 01/17/2023 1527   UROBILINOGEN 0.2 08/12/2014 1805   NITRITE NEGATIVE 01/17/2023 1527   LEUKOCYTESUR NEGATIVE 01/17/2023 1527   Sepsis Labs Recent Labs  Lab 03/18/23 1543 03/20/23 0411 03/21/23 0418  WBC 5.3 5.6 5.4   Microbiology Recent Results (from the past 240 hours)  Resp panel by RT-PCR (RSV, Flu A&B, Covid) Anterior Nasal Swab     Status: None   Collection Time: 03/18/23  8:16 PM   Specimen: Anterior Nasal Swab  Result Value Ref Range Status   SARS Coronavirus 2 by RT PCR NEGATIVE NEGATIVE Final    Comment: (NOTE) SARS-CoV-2 target nucleic acids are NOT DETECTED.  The SARS-CoV-2 RNA is generally detectable in upper respiratory specimens during the acute phase of infection. The lowest concentration of SARS-CoV-2 viral copies this assay can detect is 138 copies/mL. A negative result does not preclude SARS-Cov-2 infection and should not be used as the sole basis for treatment or other patient management decisions. A negative result may occur with  improper specimen collection/handling, submission of specimen other than nasopharyngeal swab, presence of viral mutation(s) within the areas targeted by this assay, and inadequate number of viral copies(<138 copies/mL). A negative result must be combined with clinical observations, patient history, and epidemiological information. The expected result is Negative.  Fact Sheet for Patients:  BloggerCourse.com  Fact Sheet for Healthcare Providers:  SeriousBroker.it  This test is no t yet approved or cleared by the [place] FDA and  has been authorized for detection and/or diagnosis of SARS-CoV-2 by FDA under an Emergency Use Authorization (EUA). This EUA will remain  in effect (meaning this test can be used) for the duration of the COVID-19 declaration under Section 564(b)(1) of the Act, 21 U.S.C.section  360bbb-3(b)(1), unless the authorization is terminated  or revoked sooner.       Influenza A by PCR NEGATIVE NEGATIVE Final   Influenza B by PCR NEGATIVE NEGATIVE Final    Comment: (NOTE) The Xpert Xpress SARS-CoV-2/FLU/RSV plus assay is intended as an aid in the diagnosis of influenza from Nasopharyngeal swab specimens and should not be used as a sole basis for treatment. Nasal washings and aspirates are unacceptable for Xpert Xpress SARS-CoV-2/FLU/RSV testing.  Fact Sheet for Patients: BloggerCourse.com  Fact Sheet for Healthcare Providers: SeriousBroker.it  This test is not yet approved or cleared by the [place] FDA and has been authorized for detection and/or diagnosis of SARS-CoV-2 by FDA under an Emergency Use Authorization (EUA). This EUA will remain in effect (meaning this test can be used) for the duration of the COVID-19 declaration under Section 564(b)(1) of the Act, 21 U.S.C. section 360bbb-3(b)(1), unless the authorization is terminated or revoked.     Resp Syncytial Virus by PCR NEGATIVE NEGATIVE Final    Comment: (NOTE) Fact Sheet for Patients: BloggerCourse.com  Fact Sheet for Healthcare Providers: SeriousBroker.it  This test is not yet approved or cleared by the [place] FDA and has been authorized for detection and/or diagnosis of SARS-CoV-2 by FDA under an Emergency Use Authorization (EUA). This EUA will remain in effect (meaning this test can be used) for the duration of the COVID-19 declaration under Section 564(b)(1) of the Act, 21 U.S.C. section 360bbb-3(b)(1), unless the authorization is terminated or revoked.  Performed at [facility], [address] [place], [place] [address]   Time coordinating discharge: 25 minutes  SIGNED: Lanae Boast, MD  [facility] 03/21/2023, 11:56 AM  If 7PM-7AM, please contact  night-coverage [email or web address]

## 2023-04-08 ENCOUNTER — Inpatient Hospital Stay (HOSPITAL_BASED_OUTPATIENT_CLINIC_OR_DEPARTMENT_OTHER)
Admission: EM | Admit: 2023-04-08 | Discharge: 2023-04-16 | DRG: 291 | Disposition: A | Discharge status: Home Health | Attending: Internal Medicine | Admitting: Internal Medicine

## 2023-04-08 ENCOUNTER — Emergency Department (HOSPITAL_BASED_OUTPATIENT_CLINIC_OR_DEPARTMENT_OTHER)

## 2023-04-08 ENCOUNTER — Encounter (HOSPITAL_BASED_OUTPATIENT_CLINIC_OR_DEPARTMENT_OTHER): Payer: Self-pay | Admitting: Emergency Medicine

## 2023-04-08 ENCOUNTER — Other Ambulatory Visit: Payer: Self-pay

## 2023-04-08 DIAGNOSIS — I69320 Aphasia following cerebral infarction: Secondary | ICD-10-CM

## 2023-04-08 DIAGNOSIS — Z6838 Body mass index (BMI) 38.0-38.9, adult: Secondary | ICD-10-CM

## 2023-04-08 DIAGNOSIS — E870 Hyperosmolality and hypernatremia: Secondary | ICD-10-CM | POA: Diagnosis present

## 2023-04-08 DIAGNOSIS — I130 Hypertensive heart and chronic kidney disease with heart failure and stage 1 through stage 4 chronic kidney disease, or unspecified chronic kidney disease: Secondary | ICD-10-CM | POA: Diagnosis not present

## 2023-04-08 DIAGNOSIS — I509 Heart failure, unspecified: Secondary | ICD-10-CM

## 2023-04-08 DIAGNOSIS — X58XXXD Exposure to other specified factors, subsequent encounter: Secondary | ICD-10-CM | POA: Diagnosis present

## 2023-04-08 DIAGNOSIS — E876 Hypokalemia: Secondary | ICD-10-CM | POA: Diagnosis present

## 2023-04-08 DIAGNOSIS — D631 Anemia in chronic kidney disease: Secondary | ICD-10-CM | POA: Diagnosis present

## 2023-04-08 DIAGNOSIS — I5033 Acute on chronic diastolic (congestive) heart failure: Principal | ICD-10-CM | POA: Diagnosis present

## 2023-04-08 DIAGNOSIS — I4811 Longstanding persistent atrial fibrillation: Secondary | ICD-10-CM | POA: Diagnosis present

## 2023-04-08 DIAGNOSIS — I872 Venous insufficiency (chronic) (peripheral): Secondary | ICD-10-CM | POA: Diagnosis present

## 2023-04-08 DIAGNOSIS — G9389 Other specified disorders of brain: Secondary | ICD-10-CM | POA: Diagnosis present

## 2023-04-08 DIAGNOSIS — E1169 Type 2 diabetes mellitus with other specified complication: Secondary | ICD-10-CM | POA: Diagnosis present

## 2023-04-08 DIAGNOSIS — N179 Acute kidney failure, unspecified: Secondary | ICD-10-CM | POA: Diagnosis not present

## 2023-04-08 DIAGNOSIS — I2510 Atherosclerotic heart disease of native coronary artery without angina pectoris: Secondary | ICD-10-CM | POA: Diagnosis present

## 2023-04-08 DIAGNOSIS — H1132 Conjunctival hemorrhage, left eye: Secondary | ICD-10-CM | POA: Diagnosis present

## 2023-04-08 DIAGNOSIS — Z79899 Other long term (current) drug therapy: Secondary | ICD-10-CM

## 2023-04-08 DIAGNOSIS — S81801A Unspecified open wound, right lower leg, initial encounter: Secondary | ICD-10-CM | POA: Diagnosis present

## 2023-04-08 DIAGNOSIS — I482 Chronic atrial fibrillation, unspecified: Secondary | ICD-10-CM | POA: Diagnosis present

## 2023-04-08 DIAGNOSIS — E785 Hyperlipidemia, unspecified: Secondary | ICD-10-CM | POA: Diagnosis present

## 2023-04-08 DIAGNOSIS — Z7901 Long term (current) use of anticoagulants: Secondary | ICD-10-CM

## 2023-04-08 DIAGNOSIS — T148XXA Other injury of unspecified body region, initial encounter: Secondary | ICD-10-CM

## 2023-04-08 DIAGNOSIS — E66812 Obesity, class 2: Secondary | ICD-10-CM | POA: Diagnosis present

## 2023-04-08 DIAGNOSIS — Z91041 Radiographic dye allergy status: Secondary | ICD-10-CM

## 2023-04-08 DIAGNOSIS — I4891 Unspecified atrial fibrillation: Secondary | ICD-10-CM | POA: Diagnosis present

## 2023-04-08 DIAGNOSIS — Q2112 Patent foramen ovale: Secondary | ICD-10-CM

## 2023-04-08 DIAGNOSIS — I6930 Unspecified sequelae of cerebral infarction: Secondary | ICD-10-CM

## 2023-04-08 DIAGNOSIS — E1129 Type 2 diabetes mellitus with other diabetic kidney complication: Secondary | ICD-10-CM | POA: Diagnosis present

## 2023-04-08 DIAGNOSIS — N400 Enlarged prostate without lower urinary tract symptoms: Secondary | ICD-10-CM | POA: Diagnosis present

## 2023-04-08 DIAGNOSIS — Z7985 Long-term (current) use of injectable non-insulin antidiabetic drugs: Secondary | ICD-10-CM

## 2023-04-08 DIAGNOSIS — N1832 Chronic kidney disease, stage 3b: Secondary | ICD-10-CM | POA: Diagnosis present

## 2023-04-08 DIAGNOSIS — I4819 Other persistent atrial fibrillation: Secondary | ICD-10-CM

## 2023-04-08 DIAGNOSIS — Z993 Dependence on wheelchair: Secondary | ICD-10-CM

## 2023-04-08 DIAGNOSIS — I69351 Hemiplegia and hemiparesis following cerebral infarction affecting right dominant side: Secondary | ICD-10-CM

## 2023-04-08 DIAGNOSIS — Z7984 Long term (current) use of oral hypoglycemic drugs: Secondary | ICD-10-CM

## 2023-04-08 DIAGNOSIS — E1122 Type 2 diabetes mellitus with diabetic chronic kidney disease: Secondary | ICD-10-CM | POA: Diagnosis present

## 2023-04-08 DIAGNOSIS — N183 Chronic kidney disease, stage 3 unspecified: Secondary | ICD-10-CM | POA: Diagnosis present

## 2023-04-08 DIAGNOSIS — I10 Essential (primary) hypertension: Secondary | ICD-10-CM

## 2023-04-08 DIAGNOSIS — I69322 Dysarthria following cerebral infarction: Secondary | ICD-10-CM

## 2023-04-08 DIAGNOSIS — Z888 Allergy status to other drugs, medicaments and biological substances status: Secondary | ICD-10-CM

## 2023-04-08 DIAGNOSIS — S81801D Unspecified open wound, right lower leg, subsequent encounter: Secondary | ICD-10-CM

## 2023-04-08 LAB — COMPREHENSIVE METABOLIC PANEL
ALT: 14 U/L (ref 0–44)
AST: 24 U/L (ref 15–41)
Albumin: 3.6 g/dL (ref 3.5–5.0)
Alkaline Phosphatase: 73 U/L (ref 38–126)
Anion gap: 8 (ref 5–15)
BUN: 19 mg/dL (ref 8–23)
CO2: 26 mmol/L (ref 22–32)
Calcium: 8.2 mg/dL — ABNORMAL LOW (ref 8.9–10.3)
Chloride: 109 mmol/L (ref 98–111)
Creatinine, Ser: 1.88 mg/dL — ABNORMAL HIGH (ref 0.61–1.24)
GFR, Estimated: 40 mL/min — ABNORMAL LOW (ref >60)
Glucose, Bld: 83 mg/dL (ref 70–99)
Potassium: 3.8 mmol/L (ref 3.5–5.1)
Sodium: 143 mmol/L (ref 135–145)
Total Bilirubin: 1.4 mg/dL — ABNORMAL HIGH (ref 0.0–1.2)
Total Protein: 8 g/dL (ref 6.5–8.1)

## 2023-04-08 LAB — URINALYSIS, ROUTINE W REFLEX MICROSCOPIC
Bilirubin Urine: NEGATIVE
Glucose, UA: >=500 mg/dL — AB
Ketones, ur: NEGATIVE mg/dL
Leukocytes,Ua: NEGATIVE
Nitrite: NEGATIVE
Protein, ur: 30 mg/dL — AB
Specific Gravity, Urine: 1.015 (ref 1.005–1.030)
pH: 6 (ref 5.0–8.0)

## 2023-04-08 LAB — LIPASE, BLOOD: Lipase: 21 U/L (ref 11–51)

## 2023-04-08 LAB — URINALYSIS, MICROSCOPIC (REFLEX)

## 2023-04-08 LAB — CBC
HCT: 35.9 % — ABNORMAL LOW (ref 39.0–52.0)
Hemoglobin: 11.2 g/dL — ABNORMAL LOW (ref 13.0–17.0)
MCH: 25.6 pg — ABNORMAL LOW (ref 26.0–34.0)
MCHC: 31.2 g/dL (ref 30.0–36.0)
MCV: 82 fL (ref 80.0–100.0)
Platelets: 212 10*3/uL (ref 150–400)
RBC: 4.38 MIL/uL (ref 4.22–5.81)
RDW: 17.2 % — ABNORMAL HIGH (ref 11.5–15.5)
WBC: 7.6 10*3/uL (ref 4.0–10.5)
nRBC: 0 % (ref 0.0–0.2)

## 2023-04-08 LAB — CBG MONITORING, ED: Glucose-Capillary: 99 mg/dL (ref 70–99)

## 2023-04-08 LAB — BRAIN NATRIURETIC PEPTIDE: B Natriuretic Peptide: 746.8 pg/mL — ABNORMAL HIGH (ref 0.0–100.0)

## 2023-04-08 MED ORDER — FUROSEMIDE 10 MG/ML IJ SOLN
80.0000 mg | Freq: Once | INTRAMUSCULAR | Status: AC
  Administered 2023-04-08: 80 mg via INTRAVENOUS
  Filled 2023-04-08: qty 8

## 2023-04-08 MED ORDER — ALBUTEROL SULFATE (2.5 MG/3ML) 0.083% IN NEBU
INHALATION_SOLUTION | RESPIRATORY_TRACT | Status: AC
  Administered 2023-04-08: 2.5 mg
  Filled 2023-04-08: qty 3

## 2023-04-08 MED ORDER — IPRATROPIUM-ALBUTEROL 0.5-2.5 (3) MG/3ML IN SOLN
RESPIRATORY_TRACT | Status: AC
Start: 2023-04-08 — End: 2023-04-08
  Administered 2023-04-08: 3 mL
  Filled 2023-04-08: qty 3

## 2023-04-08 NOTE — ED Notes (Signed)
 ED Provider at bedside.

## 2023-04-08 NOTE — ED Notes (Signed)
Patient transported to X-ray via stretcher 

## 2023-04-08 NOTE — ED Notes (Signed)
 Pt given baked chicken dinner with ginger ale per RN.
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
## 2023-04-08 NOTE — ED Provider Notes (Signed)
 [facility] Provider Note   CSN: [id number] Arrival date & time: 04/08/23  1357     History  Chief Complaint  Patient presents with   Shortness of Breath    Justin Molina. is a 63 y.o. 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  Patient presents to ED for evaluation of shortness of breath, leg swelling.  The patient wife is here with the patient and provides the majority of the history.  The patient wife reports that the patient was discharged from the hospital due to CHF exacerbation back in February.  She reports that ever since being discharged she has had worsening shortness of breath, dyspnea, orthopnea as well as lower extremity swelling.  She reports that the patient is compliant on his 40 mg of Lasix which was recently changed to 80 mg of Lasix per day.  The patient denies chest pain, nausea, vomiting, fevers.  Patient is endorsing abdominal pain that began this morning but denies any diarrhea.  He also woke up with a subconjunctival hemorrhage to his left eye this morning the patient wife is concerned that he has had a stroke.  He has no new deficits on examination, the patient reports that his right sided weakness is at baseline.   Shortness of Breath Associated symptoms: abdominal pain and cough   Associated symptoms: no chest pain, no fever and no vomiting        Home Medications Prior to Admission medications   Medication Sig Start Date End Date Taking? Authorizing Provider  acetaminophen (TYLENOL) 500 MG tablet Take 1 tablet (500 mg total) by mouth every 6 (six) hours as needed. Patient not taking: Reported on 03/19/2023 12/26/14   Teressa Lower, NP  albuterol (VENTOLIN HFA) 108 (90 Base) MCG/ACT inhaler Inhale 2 puffs into the lungs every 4 (four) hours as needed for wheezing or shortness  of breath. 09/24/22   [provider]  amLODipine (NORVASC) 5 MG tablet Take 1 tablet (5 mg total) by mouth daily. Patient not taking: Reported on 03/19/2023 11/17/21   Ellsworth Lennox, PA-C  atorvastatin (LIPITOR) 80 MG tablet Take 1 tablet (80 mg total) by mouth daily. Patient taking differently: Take 80 mg by mouth at bedtime. 11/17/21   Ellsworth Lennox, PA-C  blood glucose meter kit and supplies KIT Dispense based on patient and insurance preference. Use up to four times daily as directed. 11/17/21   Ellsworth Lennox, PA-C  Blood Glucose Monitoring Suppl (ACCU-CHEK AVIVA PLUS) W/DEVICE KIT Check blood sugar TID  & QHS 11/17/14   Ambrose Finland, NP  Blood Pressure Monitoring (BLOOD PRESSURE CUFF) MISC 1 Units by Does not apply route daily. 11/17/21   Ellsworth Lennox, PA-C  carvedilol (COREG) 25 MG tablet Take 1 tablet (25 mg total) by mouth 2 (two) times daily with a meal. Patient needs office visit for more refills 11/17/21   Ellsworth Lennox, PA-C  doxazosin (CARDURA) 8 MG tablet Take 1 tablet (8 mg total) by mouth daily. Patient taking differently: Take 8 mg by mouth at bedtime. 11/17/21   Ellsworth Lennox, PA-C  ELIQUIS 5 MG TABS tablet Take 1 tablet (5 mg total) by mouth 2 (two) times daily. 11/17/21   Ellsworth Lennox, PA-C  fluticasone [facility]) 50 MCG/ACT nasal spray Place 2 sprays into both nostrils daily as needed for allergies or rhinitis. 09/24/22   [provider]  furosemide (LASIX)  40 MG tablet Take 40 mg by mouth daily. 12/25/22   [provider]  glucose blood (ACCU-CHEK AVIVA PLUS) test strip Use as instructed for TID and QHS blood glucose testing 05/23/15   Quentin Angst, MD  hydrALAZINE (APRESOLINE) 50 MG tablet Take 3 tablets (150 mg total) by mouth 3 (three) times daily. Patient taking differently: Take 50 mg by mouth 2 (two) times daily. 01/25/23 03/19/23  Hughie Closs, MD  isosorbide mononitrate (IMDUR) 120 MG 24 hr tablet Take 1 tablet (120 mg total) by mouth daily.  11/17/21   Ellsworth Lennox, PA-C  JARDIANCE 10 MG TABS tablet Take 10 mg by mouth daily. 12/25/22   [provider]  Lancets 28G MISC Check blood sugar TID & QHS 05/23/15   Jegede, Phylliss Blakes, MD  LANTUS SOLOSTAR 100 UNIT/ML Solostar Pen Inject 15 Units into the skin at bedtime as needed (for an elevated BGL). 01/14/23   [provider]  loratadine (CLARITIN) 10 MG tablet Take 10 mg by mouth daily as needed for allergies or rhinitis. 11/06/22   [provider]  Misc. Devices (BATHTUB SAFETY RAIL) MISC Patient needs safety rails for shower 01/04/15   Holland Commons A, NP  MULTAQ 400 MG tablet Take 1 tablet (400 mg total) by mouth 2 (two) times daily. 11/17/21   Ellsworth Lennox, PA-C  nystatin cream (MYCOSTATIN) Apply 1 Application topically See admin instructions. Apply a thin layer to affected areas 2 times a day for redness    [provider]  OZEMPIC, 0.25 OR 0.5 MG/DOSE, 2 MG/3ML SOPN Inject 0.25 mg into the skin every Sunday. 12/27/22   [provider]  potassium chloride (KLOR-CON M) 10 MEQ tablet Take 1 tablet (10 mEq total) by mouth daily. 11/17/21   Ellsworth Lennox, PA-C      Allergies    Contrast media [iodinated contrast media] and Iodine    Review of Systems   Review of Systems  Constitutional:  Negative for fever.  Respiratory:  Positive for cough and shortness of breath.   Cardiovascular:  Positive for leg swelling. Negative for chest pain.  Gastrointestinal:  Positive for abdominal pain. Negative for diarrhea, nausea and vomiting.  Genitourinary:  Negative for dysuria.  Neurological:  Negative for dizziness, weakness and light-headedness.  All other systems reviewed and are negative.   Physical Exam Updated Vital Signs BP (!) 153/110   Pulse 67   Temp 99.3 F (37.4 C)   Resp (!) 28   Ht 5\' 11"  (1.803 m)   Wt 118.9 kg   SpO2 98%   BMI 36.56 kg/m  Physical Exam Vitals and nursing note reviewed.  Constitutional:      General: He is  not in acute distress.    Appearance: He is well-developed.  HENT:     Head: Normocephalic and atraumatic.  Eyes:     Conjunctiva/sclera: Conjunctivae normal.  Cardiovascular:     Rate and Rhythm: Normal rate and regular rhythm.     Heart sounds: No murmur heard. Pulmonary:     Effort: Pulmonary effort is normal. No respiratory distress.     Breath sounds: Normal breath sounds.  Abdominal:     Palpations: Abdomen is soft.     Tenderness: There is no abdominal tenderness.  Musculoskeletal:        General: No swelling.     Cervical back: Neck supple.     Right lower leg: Edema present.     Left lower leg: Edema present.  Comments: Bilateral 2+ pitting edema.  Also has wound to the lateral portion of right lower extremity.  Malodorous smell.  Skin:    General: Skin is warm and dry.     Capillary Refill: Capillary refill takes less than 2 seconds.  Neurological:     Mental Status: He is alert and oriented to person, place, and time.  Psychiatric:        Mood and Affect: Mood normal.     ED Results / Procedures / Treatments   Labs (all labs ordered are listed, but only abnormal results are displayed) Labs Reviewed  CBC - Abnormal; Notable for the following components:      Result Value   Hemoglobin 11.2 (*)    HCT 35.9 (*)    MCH 25.6 (*)    RDW 17.2 (*)    All other components within normal limits  BRAIN NATRIURETIC PEPTIDE - Abnormal; Notable for the following components:   B Natriuretic Peptide 746.8 (*)    All other components within normal limits  COMPREHENSIVE METABOLIC PANEL - Abnormal; Notable for the following components:   Creatinine, Ser 1.88 (*)    Calcium 8.2 (*)    Total Bilirubin 1.4 (*)    GFR, Estimated 40 (*)    All other components within normal limits  URINALYSIS, ROUTINE W REFLEX MICROSCOPIC - Abnormal; Notable for the following components:   Glucose, UA >=500 (*)    Hgb urine dipstick SMALL (*)    Protein, ur 30 (*)    All other components  within normal limits  URINALYSIS, MICROSCOPIC (REFLEX) - Abnormal; Notable for the following components:   Bacteria, UA RARE (*)    All other components within normal limits  LIPASE, BLOOD  CBG MONITORING, ED    EKG EKG Interpretation Date/Time:  Monday April 08 2023 14:21:55 EST Ventricular Rate:  95 PR Interval:    QRS Duration:  102 QT Interval:  430 QTC Calculation: 540 R Axis:   -87  Text Interpretation: Atrial fibrillation with a competing junctional pacemaker Left axis deviation Anterolateral infarct , age undetermined Abnormal ECG When compared with ECG of 18-Mar-2023 15:42, PREVIOUS ECG IS PRESENT no sig change from previous Confirmed by Arby Barrette [phone]) on 04/08/2023 5:01:16 PM  Radiology DG Chest 2 View Result Date: 04/08/2023 CLINICAL DATA:  Shortness of breath. Bilateral leg swelling. Weight gain. EXAM: CHEST - 2 VIEW COMPARISON:  03/18/2023 FINDINGS: Stable enlarged cardiac silhouette and tortuous aorta. Clear lungs with normal vascularity. Interval small right pleural effusion. Unremarkable bones IMPRESSION: 1. Interval small right pleural effusion. 2. Stable cardiomegaly. Electronically Signed   By: Beckie Salts M.D.   On: 04/08/2023 18:07   CT Head Wo Contrast Result Date: 04/08/2023 CLINICAL DATA:  Subconjunctival hemorrhage EXAM: CT HEAD WITHOUT CONTRAST TECHNIQUE: Contiguous axial images were obtained from the base of the skull through the vertex without intravenous contrast. RADIATION DOSE REDUCTION: This exam was performed according to the departmental dose-optimization program which includes automated exposure control, adjustment of the mA and/or kV according to patient size and/or use of iterative reconstruction technique. COMPARISON:  01/20/2023 FINDINGS: Brain: Unchanged gliosis and encephalomalacia throughout the left hemisphere. No acute hemorrhage. Dural thickening underlying the craniotomy site is unchanged. Chronic ischemic white matter changes. Vascular: No  hyperdense vessel or unexpected vascular calcification. Skull: The visualized skull base, calvarium and extracranial soft tissues are normal. Sinuses/Orbits: No fluid levels or advanced mucosal thickening of the visualized paranasal sinuses. No mastoid or middle ear effusion. Normal orbits. Other: None.  IMPRESSION: 1. No acute intracranial abnormality. 2. Unchanged gliosis and encephalomalacia throughout the left hemisphere. Electronically Signed   By: Deatra Robinson M.D.   On: 04/08/2023 15:53    Procedures Procedures   Medications Ordered in ED Medications  furosemide (LASIX) injection 80 mg (80 mg Intravenous Given 04/08/23 1603)  ipratropium-albuterol (DUONEB) 0.5-2.5 (3) MG/3ML nebulizer solution (3 mLs  Given 04/08/23 1546)  albuterol (PROVENTIL) (2.5 MG/3ML) 0.083% nebulizer solution (2.5 mg  Given 04/08/23 1546)    ED Course/ Medical Decision Making/ A&P  Medical Decision Making Amount and/or Complexity of Data Reviewed Labs: ordered. Radiology: ordered.  Risk Prescription drug management. Decision regarding hospitalization.   63 year old male presents for evaluation.  Please see HPI for further details.  On examination the patient is afebrile and nontachycardic.  His lung sounds are clear bilaterally, his oxygen saturations 94% room air.  Abdomen is soft and compressible.  Neuroexam is at baseline with right-sided neurodeficits as a result of previous CVA.  The patient denies any new features to his right sided weakness.  Also has a diabetic wound with malodorous discharge to lateral portion of right lower extremity.  Suspect CHF exacerbation.  Will assess for the same.  CBC without leukocytosis, baseline hemoglobin.  Patient lipase WNL.  Urinalysis unremarkable.  CMP with baseline creatinine.  BNP elevated to 746.8.  Patient given 80 milligrams of Lasix.  Diuresing well.  Addendum shift, patient workup complete.  Signed out to [employer].   Final Clinical Impression(s) / ED  Diagnoses Final diagnoses:  Acute on chronic diastolic congestive heart failure (HCC)  Chronic wound  Subconjunctival hemorrhage of left eye    Rx / DC Orders ED Discharge Orders     None         Al Decant, PA-C 04/08/23 2109    Coral Spikes, DO 04/09/23 [phone]

## 2023-04-08 NOTE — ED Notes (Addendum)
 Pt asked if he could have something to eat. Ok for pt to eat per Renne Crigler, PA.

## 2023-04-08 NOTE — ED Notes (Signed)
 [facility] called for transport.

## 2023-04-08 NOTE — ED Triage Notes (Signed)
 Per family  pt has swollen legs just d/c from [facility] on Feb !!  He has gained some weight hx of stroke and sob  no cp

## 2023-04-08 NOTE — ED Provider Notes (Addendum)
 Care assumed from [facility]. Briefly, patient presents for worsening swelling and shortness of breath.  Admitted about 3 weeks ago for heart failure exacerbation, known HFpEF.  Was on 40 of furosemide.  Family states that PCP increased dose to 80mg  about a week ago.  He has had worsening swelling in extremities, difficulty breathing while lying flat. He has chronic wound to the right lower extremity, being followed by wound care in [facility].    Plan: Admission to hospital   4:56 PM Reassessment performed. Patient appears comfortable, sitting up, on 2 L oxygen.  He was 93% at rest earlier.  Labs and imaging personally reviewed and interpreted including: CBC with mildly low hemoglobin 11.2; CMP with creatinine near baseline at 1.88; BNP elevated from previous admission to 750; lipase normal; UA without compelling signs of infection.  CT head unremarkable.  Patient with previous stroke.  Patient was concerned about left subconjunctival hemorrhage.   Most current vital signs reviewed and are as follows: BP (!) 153/110   Pulse 67   Temp 99.3 F (37.4 C)   Resp (!) 28   Ht 5\' 11"  (1.803 m)   Wt 118.9 kg   SpO2 98%   BMI 36.56 kg/m   Awaiting hospitalist callback.  5:18 PM Spoke with Dr. Jarvis Newcomer, [facility], accepts for admission.        Renne Crigler, PA-C 04/08/23 Foye Clock, MD 04/08/23 [phone]

## 2023-04-09 ENCOUNTER — Encounter (HOSPITAL_COMMUNITY): Payer: Self-pay | Admitting: Family Medicine

## 2023-04-09 DIAGNOSIS — Q2112 Patent foramen ovale: Secondary | ICD-10-CM | POA: Diagnosis not present

## 2023-04-09 DIAGNOSIS — N183 Chronic kidney disease, stage 3 unspecified: Secondary | ICD-10-CM | POA: Diagnosis not present

## 2023-04-09 DIAGNOSIS — Z794 Long term (current) use of insulin: Secondary | ICD-10-CM

## 2023-04-09 DIAGNOSIS — E876 Hypokalemia: Secondary | ICD-10-CM | POA: Diagnosis present

## 2023-04-09 DIAGNOSIS — E1122 Type 2 diabetes mellitus with diabetic chronic kidney disease: Secondary | ICD-10-CM | POA: Diagnosis present

## 2023-04-09 DIAGNOSIS — E870 Hyperosmolality and hypernatremia: Secondary | ICD-10-CM | POA: Diagnosis present

## 2023-04-09 DIAGNOSIS — N1832 Chronic kidney disease, stage 3b: Secondary | ICD-10-CM | POA: Diagnosis present

## 2023-04-09 DIAGNOSIS — X58XXXD Exposure to other specified factors, subsequent encounter: Secondary | ICD-10-CM | POA: Diagnosis present

## 2023-04-09 DIAGNOSIS — N400 Enlarged prostate without lower urinary tract symptoms: Secondary | ICD-10-CM | POA: Diagnosis present

## 2023-04-09 DIAGNOSIS — I130 Hypertensive heart and chronic kidney disease with heart failure and stage 1 through stage 4 chronic kidney disease, or unspecified chronic kidney disease: Secondary | ICD-10-CM | POA: Diagnosis present

## 2023-04-09 DIAGNOSIS — Z6838 Body mass index (BMI) 38.0-38.9, adult: Secondary | ICD-10-CM | POA: Diagnosis not present

## 2023-04-09 DIAGNOSIS — N179 Acute kidney failure, unspecified: Secondary | ICD-10-CM | POA: Diagnosis not present

## 2023-04-09 DIAGNOSIS — I69320 Aphasia following cerebral infarction: Secondary | ICD-10-CM | POA: Diagnosis not present

## 2023-04-09 DIAGNOSIS — Z7901 Long term (current) use of anticoagulants: Secondary | ICD-10-CM | POA: Diagnosis not present

## 2023-04-09 DIAGNOSIS — I69351 Hemiplegia and hemiparesis following cerebral infarction affecting right dominant side: Secondary | ICD-10-CM | POA: Diagnosis not present

## 2023-04-09 DIAGNOSIS — Z7984 Long term (current) use of oral hypoglycemic drugs: Secondary | ICD-10-CM | POA: Diagnosis not present

## 2023-04-09 DIAGNOSIS — D631 Anemia in chronic kidney disease: Secondary | ICD-10-CM | POA: Diagnosis present

## 2023-04-09 DIAGNOSIS — I482 Chronic atrial fibrillation, unspecified: Secondary | ICD-10-CM | POA: Diagnosis not present

## 2023-04-09 DIAGNOSIS — Z79899 Other long term (current) drug therapy: Secondary | ICD-10-CM | POA: Diagnosis not present

## 2023-04-09 DIAGNOSIS — I509 Heart failure, unspecified: Secondary | ICD-10-CM | POA: Insufficient documentation

## 2023-04-09 DIAGNOSIS — E1169 Type 2 diabetes mellitus with other specified complication: Secondary | ICD-10-CM | POA: Diagnosis present

## 2023-04-09 DIAGNOSIS — I6930 Unspecified sequelae of cerebral infarction: Secondary | ICD-10-CM | POA: Diagnosis not present

## 2023-04-09 DIAGNOSIS — E66812 Obesity, class 2: Secondary | ICD-10-CM | POA: Diagnosis present

## 2023-04-09 DIAGNOSIS — I4811 Longstanding persistent atrial fibrillation: Secondary | ICD-10-CM | POA: Diagnosis present

## 2023-04-09 DIAGNOSIS — I10 Essential (primary) hypertension: Secondary | ICD-10-CM | POA: Diagnosis not present

## 2023-04-09 DIAGNOSIS — I4819 Other persistent atrial fibrillation: Secondary | ICD-10-CM | POA: Diagnosis not present

## 2023-04-09 DIAGNOSIS — I2510 Atherosclerotic heart disease of native coronary artery without angina pectoris: Secondary | ICD-10-CM | POA: Diagnosis present

## 2023-04-09 DIAGNOSIS — G9389 Other specified disorders of brain: Secondary | ICD-10-CM | POA: Diagnosis present

## 2023-04-09 DIAGNOSIS — I5033 Acute on chronic diastolic (congestive) heart failure: Secondary | ICD-10-CM | POA: Diagnosis present

## 2023-04-09 DIAGNOSIS — E785 Hyperlipidemia, unspecified: Secondary | ICD-10-CM | POA: Diagnosis present

## 2023-04-09 DIAGNOSIS — E1121 Type 2 diabetes mellitus with diabetic nephropathy: Secondary | ICD-10-CM | POA: Diagnosis not present

## 2023-04-09 DIAGNOSIS — Z993 Dependence on wheelchair: Secondary | ICD-10-CM | POA: Diagnosis not present

## 2023-04-09 DIAGNOSIS — Z91041 Radiographic dye allergy status: Secondary | ICD-10-CM | POA: Diagnosis not present

## 2023-04-09 HISTORY — DX: Acute on chronic diastolic (congestive) heart failure: I50.33

## 2023-04-09 LAB — COMPREHENSIVE METABOLIC PANEL
ALT: 11 U/L (ref 0–44)
AST: 14 U/L — ABNORMAL LOW (ref 15–41)
Albumin: 3 g/dL — ABNORMAL LOW (ref 3.5–5.0)
Alkaline Phosphatase: 59 U/L (ref 38–126)
Anion gap: 9 (ref 5–15)
BUN: 16 mg/dL (ref 8–23)
CO2: 23 mmol/L (ref 22–32)
Calcium: 7.8 mg/dL — ABNORMAL LOW (ref 8.9–10.3)
Chloride: 111 mmol/L (ref 98–111)
Creatinine, Ser: 1.81 mg/dL — ABNORMAL HIGH (ref 0.61–1.24)
GFR, Estimated: 42 mL/min — ABNORMAL LOW (ref >60)
Glucose, Bld: 85 mg/dL (ref 70–99)
Potassium: 3.1 mmol/L — ABNORMAL LOW (ref 3.5–5.1)
Sodium: 143 mmol/L (ref 135–145)
Total Bilirubin: 0.8 mg/dL (ref 0.0–1.2)
Total Protein: 6.9 g/dL (ref 6.5–8.1)

## 2023-04-09 LAB — GLUCOSE, CAPILLARY
Glucose-Capillary: 105 mg/dL — ABNORMAL HIGH (ref 70–99)
Glucose-Capillary: 65 mg/dL — ABNORMAL LOW (ref 70–99)
Glucose-Capillary: 70 mg/dL (ref 70–99)
Glucose-Capillary: 73 mg/dL (ref 70–99)
Glucose-Capillary: 77 mg/dL (ref 70–99)
Glucose-Capillary: 93 mg/dL (ref 70–99)
Glucose-Capillary: 96 mg/dL (ref 70–99)

## 2023-04-09 LAB — CBC WITH DIFFERENTIAL/PLATELET
Abs Immature Granulocytes: 0.01 10*3/uL (ref 0.00–0.07)
Basophils Absolute: 0 10*3/uL (ref 0.0–0.1)
Basophils Relative: 0 %
Eosinophils Absolute: 0.2 10*3/uL (ref 0.0–0.5)
Eosinophils Relative: 3 %
HCT: 34.5 % — ABNORMAL LOW (ref 39.0–52.0)
Hemoglobin: 10.8 g/dL — ABNORMAL LOW (ref 13.0–17.0)
Immature Granulocytes: 0 %
Lymphocytes Relative: 24 %
Lymphs Abs: 1.2 10*3/uL (ref 0.7–4.0)
MCH: 25.4 pg — ABNORMAL LOW (ref 26.0–34.0)
MCHC: 31.3 g/dL (ref 30.0–36.0)
MCV: 81.2 fL (ref 80.0–100.0)
Monocytes Absolute: 0.7 10*3/uL (ref 0.1–1.0)
Monocytes Relative: 14 %
Neutro Abs: 2.8 10*3/uL (ref 1.7–7.7)
Neutrophils Relative %: 59 %
Platelets: 196 10*3/uL (ref 150–400)
RBC: 4.25 MIL/uL (ref 4.22–5.81)
RDW: 17.3 % — ABNORMAL HIGH (ref 11.5–15.5)
WBC: 4.9 10*3/uL (ref 4.0–10.5)
nRBC: 0 % (ref 0.0–0.2)

## 2023-04-09 LAB — MAGNESIUM: Magnesium: 2.1 mg/dL (ref 1.7–2.4)

## 2023-04-09 LAB — TSH: TSH: 0.831 u[IU]/mL (ref 0.350–4.500)

## 2023-04-09 LAB — TROPONIN I (HIGH SENSITIVITY)
Troponin I (High Sensitivity): 23 ng/L — ABNORMAL HIGH (ref <18)
Troponin I (High Sensitivity): 23 ng/L — ABNORMAL HIGH (ref <18)

## 2023-04-09 MED ORDER — DRONEDARONE HCL 400 MG PO TABS
400.0000 mg | ORAL_TABLET | Freq: Two times a day (BID) | ORAL | Status: DC
  Administered 2023-04-09: 400 mg via ORAL
  Filled 2023-04-09 (×2): qty 1

## 2023-04-09 MED ORDER — INSULIN ASPART 100 UNIT/ML IJ SOLN: 0.0000 [IU] | Freq: Three times a day (TID) | INTRAMUSCULAR | Status: DC

## 2023-04-09 MED ORDER — CARVEDILOL 25 MG PO TABS
25.0000 mg | ORAL_TABLET | Freq: Two times a day (BID) | ORAL | Status: DC
Start: 2023-04-09 — End: 2023-04-16
  Administered 2023-04-09 – 2023-04-16 (×16): 25 mg via ORAL
  Filled 2023-04-09 (×16): qty 1

## 2023-04-09 MED ORDER — ACETAMINOPHEN 650 MG RE SUPP: 650.0000 mg | Freq: Four times a day (QID) | RECTAL | Status: DC | PRN

## 2023-04-09 MED ORDER — ALBUTEROL SULFATE (2.5 MG/3ML) 0.083% IN NEBU: 2.5000 mg | INHALATION_SOLUTION | RESPIRATORY_TRACT | Status: DC | PRN

## 2023-04-09 MED ORDER — POTASSIUM CHLORIDE CRYS ER 10 MEQ PO TBCR
10.0000 meq | EXTENDED_RELEASE_TABLET | Freq: Every day | ORAL | Status: DC
  Administered 2023-04-09 – 2023-04-10 (×2): 10 meq via ORAL
  Filled 2023-04-09 (×2): qty 1

## 2023-04-09 MED ORDER — KETOROLAC TROMETHAMINE 0.5 % OP SOLN
1.0000 [drp] | Freq: Four times a day (QID) | OPHTHALMIC | Status: DC
  Administered 2023-04-09 – 2023-04-12 (×14): 1 [drp] via OPHTHALMIC
  Filled 2023-04-09: qty 5

## 2023-04-09 MED ORDER — ATORVASTATIN CALCIUM 80 MG PO TABS
80.0000 mg | ORAL_TABLET | Freq: Every day | ORAL | Status: DC
  Administered 2023-04-09 – 2023-04-15 (×7): 80 mg via ORAL
  Filled 2023-04-09 (×7): qty 1

## 2023-04-09 MED ORDER — FUROSEMIDE 10 MG/ML IJ SOLN
60.0000 mg | Freq: Two times a day (BID) | INTRAMUSCULAR | Status: DC
  Administered 2023-04-09 – 2023-04-10 (×3): 60 mg via INTRAVENOUS
  Filled 2023-04-09 (×3): qty 6

## 2023-04-09 MED ORDER — ISOSORBIDE MONONITRATE ER 60 MG PO TB24
120.0000 mg | ORAL_TABLET | Freq: Every day | ORAL | Status: DC
  Administered 2023-04-09 – 2023-04-16 (×8): 120 mg via ORAL
  Filled 2023-04-09 (×8): qty 2

## 2023-04-09 MED ORDER — HYDRALAZINE HCL 50 MG PO TABS
50.0000 mg | ORAL_TABLET | Freq: Two times a day (BID) | ORAL | Status: DC
  Administered 2023-04-09: 50 mg via ORAL
  Filled 2023-04-09: qty 1

## 2023-04-09 MED ORDER — APIXABAN 5 MG PO TABS
5.0000 mg | ORAL_TABLET | Freq: Two times a day (BID) | ORAL | Status: DC
  Administered 2023-04-09 – 2023-04-16 (×15): 5 mg via ORAL
  Filled 2023-04-09 (×15): qty 1

## 2023-04-09 MED ORDER — ACETAMINOPHEN 325 MG PO TABS: 650.0000 mg | ORAL_TABLET | Freq: Four times a day (QID) | ORAL | Status: DC | PRN

## 2023-04-09 MED ORDER — DOXAZOSIN MESYLATE 8 MG PO TABS
8.0000 mg | ORAL_TABLET | Freq: Every day | ORAL | Status: DC
  Administered 2023-04-09 – 2023-04-15 (×7): 8 mg via ORAL
  Filled 2023-04-09 (×8): qty 1

## 2023-04-09 MED ORDER — HYDRALAZINE HCL 50 MG PO TABS
50.0000 mg | ORAL_TABLET | Freq: Three times a day (TID) | ORAL | Status: DC
  Administered 2023-04-09 – 2023-04-13 (×12): 50 mg via ORAL
  Filled 2023-04-09 (×12): qty 1

## 2023-04-09 NOTE — Progress Notes (Signed)
 [facility] PROGRESS NOTE  Justin Molina. (DOB: 1960-10-17) UJW:[id number] PCP: Theodis Shove, DO  Brief Narrative: Justin Molina. is a 63 y.o. 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 He had been admitted for CHF exacerbation 2/10-2/13, discharged on lasix 40mg  daily which was increased by his PCP about a week ago. Has cardiology clinic appointment 3/11. He appeared volume overloaded in the ED, started on IV lasix and admitted this morning by Dr. Toniann Fail.   Subjective: Speech abnormal but at baseline, can communicate. Spouse at bedside. He feels no shortness of breath currently at rest but significant dyspnea when laying back at all which is new/worse.   Objective: BP (!) 161/99   Pulse 93   Temp 97.9 F (36.6 C) (Other (Comment))   Resp 19   Ht 5\' 11"  (1.803 m)   Wt 130.5 kg   SpO2 98%   BMI 40.13 kg/m   Gen: Pleasant male in no distress Pulm: Clear, nonlabored   CV: Irreg irreg, rate in 90's, LE edema noted, mild +JVD GI: Soft, NT, ND, +BS Neuro: Alert and oriented. R hemiparesis is stable, dysarthria stable, no new focal deficits. Ext: Warm, dry, palpable DP pulses.  Skin: Left lower leg with healed/scarred large areas of previous wounds without open ulcer noted. Right lateral shin with irregular shallow ulcer with heaped borders, slough, no exudate, odor or significant surrounding erythema.   Assessment & Plan: Acute on chronic HFpEF, HTN: LVEF 60-65% in Dec 2024.  - Continue lasix 60mg  IV q12h, monitor I/O, weights.  - Appreciate cardiology recommendations due to recurrent hospitalizations.  - Continue coreg, imdur, hydralazine. BP elevated this AM.  Leg swelling, venous insufficiency, chronic RLE wound: Does not appear  infected at admission.  - WOC consulted, agree with recommendations. Needs to continue outpatient management as well.   History of ICH with right-sided hemiparesis: CT nonacute in ED.  - Continue monitoring  Chronic atrial fibrillation: Rate on controlled on admission ECG and current exam. - Continue coreg, dronedarone, eliquis  Stage IIIa CKD: Based on current CrCl trends.  - Monitor BMP with diuresis.   Subconjunctival hemorrhage: Asymptomatic, may have been due to rubbing his eye due to allergies.  - Ophthalmic ketorolac for allergies - Supportive care for subconjunctival hemorrhage.   HLD:  - Continue statin  BPH:  - Continue doxazosin  Anemia of CKD:   Tyrone Nine, MD [facility] [email or web address] 04/09/2023, 9:59 AM

## 2023-04-09 NOTE — H&P (Signed)
 History and Physical    Justin Molina. AOZ:[id number] DOB: 05/27/1960 DOA: 04/08/2023  Patient coming from: Home.  Chief Complaint: Shortness of breath.  HPI: Justin Hoe. is a 63 y.o. male with history of chronic diastolic CHF last EF measured was 60 to 65% on 12/24, atrial fibrillation, hypertension, chronic kidney disease stage III, chronic anemia, obesity, history of CVA with right-sided hemiparesis presents to the ER with complaint of worsening shortness of breath increasing peripheral edema.  Patient states since discharge from hospital after being admitted for CHF on 03/21/2023 patient started gaining weight again and had followed up with primary care physician last week and for the last one week patient's Lasix dose was increased from 40 mg to 80 mg daily despite taking which patient's edema is worsening.  Denies any chest pain fever or chills.  Did notice some subconjunctival hemorrhage on the left eye.  ED Course: In the ER chest x-ray showed right-sided pleural effusion.  BNP was 746.  Creatinine 1.8 hemoglobin 11.2.  Patient was given IV Lasix admitted for acute CHF.  Since patient had left subconjunctival hemorrhage CT head was done which was not showing anything acute.  Review of Systems: As per HPI, rest all negative.   Past Medical History:  Diagnosis Date   Chronic renal insufficiency    Diabetes mellitus without complication (HCC)    Hypertension    Stroke [facility])     Past Surgical History:  Procedure Laterality Date   BRAIN SURGERY     GASTROSTOMY TUBE PLACEMENT     HERNIA REPAIR     TRACHEOSTOMY       reports that he has never smoked. He has never used smokeless tobacco. He reports that he does not drink alcohol and does not use drugs.  Allergies  Allergen Reactions   Contrast Media [Iodinated Contrast Media] Shortness Of Breath, Nausea And Vomiting, Swelling and Other (See Comments)    Facial swelling and felt flushed, too   Iodine Shortness Of  Breath, Nausea And Vomiting, Swelling and Other (See Comments)    Facial swelling and felt flushed, too    History reviewed. No pertinent family history.  Prior to Admission medications   Medication Sig Start Date End Date Taking? Authorizing Provider  acetaminophen (TYLENOL) 500 MG tablet Take 1 tablet (500 mg total) by mouth every 6 (six) hours as needed. Patient not taking: Reported on 03/19/2023 12/26/14   Teressa Lower, NP  albuterol (VENTOLIN HFA) 108 (90 Base) MCG/ACT inhaler Inhale 2 puffs into the lungs every 4 (four) hours as needed for wheezing or shortness of breath. 09/24/22   [provider]  amLODipine (NORVASC) 5 MG tablet Take 1 tablet (5 mg total) by mouth daily. Patient not taking: Reported on 03/19/2023 11/17/21   Ellsworth Lennox, PA-C  atorvastatin (LIPITOR) 80 MG tablet Take 1 tablet (80 mg total) by mouth daily. Patient taking differently: Take 80 mg by mouth at bedtime. 11/17/21   Ellsworth Lennox, PA-C  blood glucose meter kit and supplies KIT Dispense based on patient and insurance preference. Use up to four times daily as directed. 11/17/21   Ellsworth Lennox, PA-C  Blood Glucose Monitoring Suppl (ACCU-CHEK AVIVA PLUS) W/DEVICE KIT Check blood sugar TID  & QHS 11/17/14   Ambrose Finland, NP  Blood Pressure Monitoring (BLOOD PRESSURE CUFF) MISC 1 Units by Does not apply route daily. 11/17/21   Ellsworth Lennox, PA-C  carvedilol (COREG) 25 MG tablet Take 1 tablet (25 mg total) by mouth 2 (two)  times daily with a meal. Patient needs office visit for more refills 11/17/21   Ellsworth Lennox, PA-C  doxazosin (CARDURA) 8 MG tablet Take 1 tablet (8 mg total) by mouth daily. Patient taking differently: Take 8 mg by mouth at bedtime. 11/17/21   Ellsworth Lennox, PA-C  ELIQUIS 5 MG TABS tablet Take 1 tablet (5 mg total) by mouth 2 (two) times daily. 11/17/21   Ellsworth Lennox, PA-C  fluticasone [facility]) 50 MCG/ACT nasal spray Place 2 sprays into both nostrils daily as needed for allergies or  rhinitis. 09/24/22   [provider]  furosemide (LASIX) 40 MG tablet Take 40 mg by mouth daily. 12/25/22   [provider]  glucose blood (ACCU-CHEK AVIVA PLUS) test strip Use as instructed for TID and QHS blood glucose testing 05/23/15   Quentin Angst, MD  hydrALAZINE (APRESOLINE) 50 MG tablet Take 3 tablets (150 mg total) by mouth 3 (three) times daily. Patient taking differently: Take 50 mg by mouth 2 (two) times daily. 01/25/23 03/19/23  Hughie Closs, MD  isosorbide mononitrate (IMDUR) 120 MG 24 hr tablet Take 1 tablet (120 mg total) by mouth daily. 11/17/21   Ellsworth Lennox, PA-C  JARDIANCE 10 MG TABS tablet Take 10 mg by mouth daily. 12/25/22   [provider]  Lancets 28G MISC Check blood sugar TID & QHS 05/23/15   Jegede, Phylliss Blakes, MD  LANTUS SOLOSTAR 100 UNIT/ML Solostar Pen Inject 15 Units into the skin at bedtime as needed (for an elevated BGL). 01/14/23   [provider]  loratadine (CLARITIN) 10 MG tablet Take 10 mg by mouth daily as needed for allergies or rhinitis. 11/06/22   [provider]  Misc. Devices (BATHTUB SAFETY RAIL) MISC Patient needs safety rails for shower 01/04/15   Holland Commons A, NP  MULTAQ 400 MG tablet Take 1 tablet (400 mg total) by mouth 2 (two) times daily. 11/17/21   Ellsworth Lennox, PA-C  nystatin cream (MYCOSTATIN) Apply 1 Application topically See admin instructions. Apply a thin layer to affected areas 2 times a day for redness    [provider]  OZEMPIC, 0.25 OR 0.5 MG/DOSE, 2 MG/3ML SOPN Inject 0.25 mg into the skin every Sunday. 12/27/22   [provider]  potassium chloride (KLOR-CON M) 10 MEQ tablet Take 1 tablet (10 mEq total) by mouth daily. 11/17/21   Ellsworth Lennox, PA-C    Physical Exam: Constitutional: Moderately built and nourished. Vitals:   04/08/23 1647 04/08/23 2225 04/09/23 0018 04/09/23 0313  BP:  (!) 160/104 (!) 150/91   Pulse: 67 (!) 53 78   Resp: (!) 28 (!) 26 (!) 21    Temp:  99 F (37.2 C) 98 F (36.7 C)   TempSrc:  Oral Oral   SpO2: 98% 99% 98%   Weight:    130.5 kg  Height:       Eyes: Anicteric no pallor.  Subconjunctival hemorrhage in the left eye. ENMT: No discharge from the ears eyes nose or mouth. Neck: No mass felt.  No JVD appreciated. Respiratory: No rhonchi or crepitations. Cardiovascular: S1-S2 heard. Abdomen: Soft nontender bowel sounds present.  Distended. Musculoskeletal: Bilateral lower extremity edema present. Skin: Anterior shin has a wound. Neurologic: Alert awake oriented to time place and person.  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 Psychiatric: Normal.  Normal affect.   Labs on Admission: I have personally reviewed following labs and imaging studies  CBC: Recent Labs  Lab 04/08/23 1542  WBC 7.6  HGB  11.2*  HCT 35.9*  MCV 82.0  PLT 212   Basic Metabolic Panel: Recent Labs  Lab 04/08/23 1542  NA 143  K 3.8  CL 109  CO2 26  GLUCOSE 83  BUN 19  CREATININE 1.88*  CALCIUM 8.2*   GFR: Estimated Creatinine Clearance: 56.1 mL/min (A) (by C-G formula based on SCr of 1.88 mg/dL (H)). Liver Function Tests: Recent Labs  Lab 04/08/23 1542  AST 24  ALT 14  ALKPHOS 73  BILITOT 1.4*  PROT 8.0  ALBUMIN 3.6   Recent Labs  Lab 04/08/23 1542  LIPASE 21   No results for input(s): "AMMONIA" in the last 168 hours. Coagulation Profile: No results for input(s): "INR", "PROTIME" in the last 168 hours. Cardiac Enzymes: No results for input(s): "CKTOTAL", "CKMB", "CKMBINDEX", "TROPONINI" in the last 168 hours. BNP (last 3 results) No results for input(s): "PROBNP" in the last 8760 hours. HbA1C: No results for input(s): "HGBA1C" in the last 72 hours. CBG: Recent Labs  Lab 04/08/23 1558 04/09/23 0027 04/09/23 0129  GLUCAP 99 65* 105*   Lipid Profile: No results for input(s): "CHOL", "HDL", "LDLCALC", "TRIG", "CHOLHDL", "LDLDIRECT" in the last 72 hours. Thyroid Function Tests: No  results for input(s): "TSH", "T4TOTAL", "FREET4", "T3FREE", "THYROIDAB" in the last 72 hours. Anemia Panel: No results for input(s): "VITAMINB12", "FOLATE", "FERRITIN", "TIBC", "IRON", "RETICCTPCT" in the last 72 hours. Urine analysis:    Component Value Date/Time   COLORURINE YELLOW 04/08/2023 1515   APPEARANCEUR CLEAR 04/08/2023 1515   LABSPEC 1.015 04/08/2023 1515   PHURINE 6.0 04/08/2023 1515   GLUCOSEU >=500 (A) 04/08/2023 1515   HGBUR SMALL (A) 04/08/2023 1515   BILIRUBINUR NEGATIVE 04/08/2023 1515   BILIRUBINUR neg 08/12/2014 1805   KETONESUR NEGATIVE 04/08/2023 1515   PROTEINUR 30 (A) 04/08/2023 1515   UROBILINOGEN 0.2 08/12/2014 1805   NITRITE NEGATIVE 04/08/2023 1515   LEUKOCYTESUR NEGATIVE 04/08/2023 1515   Sepsis Labs: @LABRCNTIP (procalcitonin:4,lacticidven:4) )No results found for this or any previous visit (from the past 240 hours).   Radiological Exams on Admission: DG Chest 2 View Result Date: 04/08/2023 CLINICAL DATA:  Shortness of breath. Bilateral leg swelling. Weight gain. EXAM: CHEST - 2 VIEW COMPARISON:  03/18/2023 FINDINGS: Stable enlarged cardiac silhouette and tortuous aorta. Clear lungs with normal vascularity. Interval small right pleural effusion. Unremarkable bones IMPRESSION: 1. Interval small right pleural effusion. 2. Stable cardiomegaly. Electronically Signed   By: Beckie Salts M.D.   On: 04/08/2023 18:07   CT Head Wo Contrast Result Date: 04/08/2023 CLINICAL DATA:  Subconjunctival hemorrhage EXAM: CT HEAD WITHOUT CONTRAST TECHNIQUE: Contiguous axial images were obtained from the base of the skull through the vertex without intravenous contrast. RADIATION DOSE REDUCTION: This exam was performed according to the departmental dose-optimization program which includes automated exposure control, adjustment of the mA and/or kV according to patient size and/or use of iterative reconstruction technique. COMPARISON:  01/20/2023 FINDINGS: Brain: Unchanged gliosis  and encephalomalacia throughout the left hemisphere. No acute hemorrhage. Dural thickening underlying the craniotomy site is unchanged. Chronic ischemic white matter changes. Vascular: No hyperdense vessel or unexpected vascular calcification. Skull: The visualized skull base, calvarium and extracranial soft tissues are normal. Sinuses/Orbits: No fluid levels or advanced mucosal thickening of the visualized paranasal sinuses. No mastoid or middle ear effusion. Normal orbits. Other: None. IMPRESSION: 1. No acute intracranial abnormality. 2. Unchanged gliosis and encephalomalacia throughout the left hemisphere. Electronically Signed   By: Deatra Robinson M.D.   On: 04/08/2023 15:53    EKG: Independently reviewed.  A-fib rate controlled.  Assessment/Plan Principal Problem:   Acute on chronic heart failure with preserved ejection fraction (HFpEF) (HCC) Active Problems:   DM (diabetes mellitus), type 2 with renal complications (HCC)   HTN (hypertension)   History of CVA with residual deficit   CKD (chronic kidney disease) stage 3, GFR 30-59 ml/min (HCC)   HLD (hyperlipidemia)   Unspecified atrial fibrillation (HCC)   Benign prostatic hyperplasia   Open wound of right lower extremity   Acute CHF (congestive heart failure) (HCC)    Acute on chronic HFpEF last EF measured was 60 to 65% on 12/24.  Will keep patient on Lasix 60 mg IV every 12 closely follow intake output metabolic panel daily weights. Hypertension on Imdur hydralazine Coreg.  Follow blood pressure trends. Diabetes mellitus type 2 takes Jardiance and Ozempic.  Last hemoglobin A1c was 7.5  two months ago.  Patient's wife states that patient does not take Lantus anymore because blood sugar has been low normal.  On sliding scale coverage. Hyperlipidemia on statins. BPH on Cardura. Chronic kidney disease stage III creatinine around baseline. Chronic anemia likely from renal disease follow CBC. 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  CT head unremarkable. Chronic A-fib on Eliquis Multaq and Coreg.  Since patient has acute CHF will need close monitoring IV diuresis and more than 2 midnight stay and inpatient status.   DVT prophylaxis: Eliquis. Code Status: Full code. Family Communication: Patient's wife. Disposition Plan: Cardiac telemetry. Consults called: Cardiology. Admission status: Inpatient.

## 2023-04-09 NOTE — Progress Notes (Signed)
 Orthopedic Tech Progress Note Patient Details:  Justin Molina. 09/06/1960 [id number]  Ortho Devices Type of Ortho Device: Ace wrap, Unna boot Ortho Device/Splint Location: RLE Ortho Device/Splint Interventions: Ordered, Application   Post Interventions Patient Tolerated: Well Instructions Provided: Care of device  [name] 04/09/2023, 4:44 PM

## 2023-04-09 NOTE — Progress Notes (Signed)
 Heart Failure Navigator Progress Note  Assessed for Heart & Vascular TOC clinic readiness.  Patient does not meet criteria due to EF 60-65 %, wheel chair bound, has a scheduled [facility] appointment on 04/16/2023. .   Navigator will sign off at this time.   Rhae Hammock, BSN, Scientist, clinical (histocompatibility and immunogenetics) Only

## 2023-04-09 NOTE — Consult Note (Signed)
 WOC Nurse Consult Note: patient is followed by [facility] for venous ulcer to R lower leg; has had history of same; last seen at [facility] 03/25/2023 where wound care included silver alginate to wound bed and placement of unna boots, change 2 times a week  Reason for Consult: R lower leg wound  Wound type:  full thickness r/t venous insufficiency  Pressure Injury POA: NA  Measurement: see nursing flowsheet; measured at 2 cm x 6.5 cm x 0.1 cm 03/25/2023 at [facility])  Wound bed: 50% red moist 50% brown tissue (was debrided last at [facility] 03/25/2023)  Drainage (amount, consistency, odor) see nursing flowsheet  Periwound: edematous  Dressing procedure/placement/frequency: Cleanse R lower leg ulcer with NS, cut a piece of Silver Alginate (Aquacel [place] #[id number]) to fit wound bed and cover with silicone foam.  After bedside nurse performs local wound care apply Unna boot to right leg. Unna boot to be changed 2 times weekly.    POC discussed with primary MD and bedside nurse. WOC team will not follow.  Re-consult if further needs arise.   Thank you,    Priscella Mann MSN, RN-BC, [employer] [phone]

## 2023-04-09 NOTE — Consult Note (Addendum)
 Cardiology Consultation   Patient ID: Justin Molina. MRN: [id number]; DOB: July 12, 1960  Admit date: 04/08/2023 Date of Consult: 04/09/2023  PCP:  Theodis Shove, DO   [facility] Providers Cardiologist:  None   {  Patient Profile:   Justin Koral. is a 63 y.o. male with a hx of chronic HFpEF, PFO, atrial fibrillation, hypertension, hyperlipidemia, CVA with residual right-sided hemiparesis/aphasia 2021, wheelchair-bound, CKD, type 2 diabetes who is being seen 04/09/2023 for the evaluation of CHF exacerbation at the request of Dr. Jarvis Newcomer.  History of Present Illness:   Justin Molina used to live in [place] and had been followed by a cardiologist group there. Last seen in May 2022.  Per the records he has history of PFO, evaluated by the structural team with recommendations of continued medical management.  Self-reported CAD although I do not see any records of cardiac catheterization.  He denies any history of catheterization.  For his A-fib he has been chronically on Multaq and Eliquis, no documented DCCV's.  At his last visit in May 2022 noted to be sinus at that time.  He reports he now lives here in [place] and back together with his wife who takes care of him.  He recently scheduled an appointment with Dr. One of our physicians in [facility]  He also has chronic wounds with previous admission in December 2024 for sepsis secondary to cellulitis of his right lower extremity.  He A1c at that time 7.5.  Also with another admission for CHF exacerbation discharged 2/10.  Echocardiogram with preserved EF.  Because of residual deficits from his stroke his wife provided most of his medical history and current encounter.  Patient reports that since his discharge he has had progressive accumulation of lower extremity as well as shortness of breath.  Per his report although not consistent with his discharge, his p.o. Lasix was increased from 40 mg to 80 mg.  Reports decent  urinary output with this however not very often and had progressive weight gain with the above symptoms for the last month.  At discharge reportedly was around 284.  Today he was 287 with more significant complaints of shortness of breath.  Dry weight thought to be around 280.  He was started on IV Lasix 60 mg twice daily with good urinary output and significant improvement in his peripheral edema and shortness of breath.  Currently still with shortness of breath, abdominal distention and peripheral edema.  Reports some orthopnea.  Not having any chest pain, asymptomatic from his atrial fibrillation and not noting any palpitations, fast heart rates.  No dizziness.  BNP 746.  Chest x-ray with small right-sided pleural effusion.  Potassium 3.1.  Creatinine 1.81.  Albumin 3.  Troponin 23.  Chronically elevated.  Hemoglobin 10.8.  Also with negative CT of the head due to subconjunctival hemorrhage  Past Medical History:  Diagnosis Date   Chronic renal insufficiency    Diabetes mellitus without complication (HCC)    Hypertension    Stroke [facility])     Past Surgical History:  Procedure Laterality Date   BRAIN SURGERY     GASTROSTOMY TUBE PLACEMENT     HERNIA REPAIR     TRACHEOSTOMY       Inpatient Medications: Scheduled Meds:  apixaban  5 mg Oral BID   atorvastatin  80 mg Oral QHS   carvedilol  25 mg Oral BID WC   doxazosin  8 mg Oral QHS   dronedarone  400 mg  Oral BID   furosemide  60 mg Intravenous Q12H   hydrALAZINE  50 mg Oral BID   insulin aspart  0-9 Units Subcutaneous TID WC   isosorbide mononitrate  120 mg Oral Daily   ketorolac  1 drop Left Eye QID   potassium chloride  10 mEq Oral Daily   Continuous Infusions:  PRN Meds: acetaminophen **OR** acetaminophen, albuterol  Allergies:    Allergies  Allergen Reactions   Contrast Media [Iodinated Contrast Media] Shortness Of Breath, Nausea And Vomiting, Swelling and Other (See Comments)    Facial swelling and felt flushed, too    Iodine Shortness Of Breath, Nausea And Vomiting, Swelling and Other (See Comments)    Facial swelling and felt flushed, too    Social History:   Social History   Socioeconomic History   Marital status: Married    Spouse name: Not on file   Number of children: Not on file   Years of education: Not on file   Highest education level: Not on file  Occupational History   Not on file  Tobacco Use   Smoking status: Never   Smokeless tobacco: Never  Vaping Use   Vaping status: Never Used  Substance and Sexual Activity   Alcohol use: No   Drug use: No   Sexual activity: Not on file  Other Topics Concern   Not on file  Social History Narrative   Not on file   Social Drivers of Health   Financial Resource Strain: Not on file  Food Insecurity: No Food Insecurity (04/09/2023)   Hunger Vital Sign    Worried About Running Out of Food in the Last Year: Never true    Ran Out of Food in the Last Year: Never true  Transportation Needs: No Transportation Needs (04/09/2023)   PRAPARE - Administrator, Civil Service (Medical): No    Lack of Transportation (Non-Medical): No  Physical Activity: Not on file  Stress: Not on file  Social Connections: Not on file  Intimate Partner Violence: Not At Risk (04/09/2023)   Humiliation, Afraid, Rape, and Kick questionnaire    Fear of Current or Ex-Partner: No    Emotionally Abused: No    Physically Abused: No    Sexually Abused: No    Family History:  History reviewed. No pertinent family history.   ROS:  Please see the history of present illness.  All other ROS reviewed and negative.     Physical Exam/Data:   Vitals:   04/09/23 0313 04/09/23 0812 04/09/23 0908 04/09/23 1118  BP:  (!) 161/99 (!) 161/99 (!) 167/105  Pulse:  93  88  Resp:  19  17  Temp:  97.9 F (36.6 C)  98 F (36.7 C)  TempSrc:  Oral  Oral  SpO2:  98%  95%  Weight: 130.5 kg     Height:        Intake/Output Summary (Last 24 hours) at 04/09/2023 1202 Last data  filed at 04/09/2023 4098 Gross per 24 hour  Intake 720 ml  Output 2675 ml  Net -1955 ml      04/09/2023    3:13 AM 04/08/2023    2:07 PM 03/21/2023    9:42 AM  Last 3 Weights  Weight (lbs) 287 lb 11.2 oz 262 lb 2 oz 262 lb 1.6 oz  Weight (kg) 130.5 kg 118.9 kg 118.888 kg     Body mass index is 40.13 kg/m.  General:  Well nourished, well developed, in no acute  distress HEENT: normal Neck: Significant JVD Vascular: No carotid bruits; Distal pulses 2+ bilaterally Cardiac: Irregularly irregular Lungs: Positive crackles Abd: soft, nontender, no hepatomegaly  Ext: 1-2+ edema, worse on the left side Musculoskeletal:  No deformities, BUE and BLE strength normal and equal Skin: warm and dry, chronic lower extremity wounds.  Some are wrapped. Neuro:  CNs 2-12 intact, no focal abnormalities noted Psych:  Normal affect   EKG:  The EKG was personally reviewed and demonstrates: Atrial fibrillation, heart rate 95.  No acute ST-T wave changes. Telemetry:  Telemetry was personally reviewed and demonstrates: Atrial fibrillation heart rates in the 80s to 90s  Relevant CV Studies: Echocardiogram 01/19/2023  1. Left ventricular ejection fraction, by estimation, is 60 to 65%. The  left ventricle has normal function. The left ventricle has no regional  wall motion abnormalities. There is mild left ventricular hypertrophy.  Left ventricular diastolic parameters  were normal.   2. Right ventricular systolic function is normal. The right ventricular  size is normal.   3. Left atrial size was mildly dilated.   4. The mitral valve is normal in structure. No evidence of mitral valve  regurgitation. No evidence of mitral stenosis.   5. The aortic valve is normal in structure. Aortic valve regurgitation is  not visualized. No aortic stenosis is present.   6. The inferior vena cava is normal in size with greater than 50%  respiratory variability, suggesting right atrial pressure of 3 mmHg.   Laboratory  Data:  High Sensitivity Troponin:   Recent Labs  Lab 04/09/23 0850  TROPONINIHS 23*     Chemistry Recent Labs  Lab 04/08/23 1542 04/09/23 0429  NA 143 143  K 3.8 3.1*  CL 109 111  CO2 26 23  GLUCOSE 83 85  BUN 19 16  CREATININE 1.88* 1.81*  CALCIUM 8.2* 7.8*  MG  --  2.1  GFRNONAA 40* 42*  ANIONGAP 8 9    Recent Labs  Lab 04/08/23 1542 04/09/23 0429  PROT 8.0 6.9  ALBUMIN 3.6 3.0*  AST 24 14*  ALT 14 11  ALKPHOS 73 59  BILITOT 1.4* 0.8   Lipids No results for input(s): "CHOL", "TRIG", "HDL", "LABVLDL", "LDLCALC", "CHOLHDL" in the last 168 hours.  Hematology Recent Labs  Lab 04/08/23 1542 04/09/23 0429  WBC 7.6 4.9  RBC 4.38 4.25  HGB 11.2* 10.8*  HCT 35.9* 34.5*  MCV 82.0 81.2  MCH 25.6* 25.4*  MCHC 31.2 31.3  RDW 17.2* 17.3*  PLT 212 196   Thyroid  Recent Labs  Lab 04/09/23 0429  TSH 0.831    BNP Recent Labs  Lab 04/08/23 1542  BNP 746.8*    DDimer No results for input(s): "DDIMER" in the last 168 hours.   Radiology/Studies:  DG Chest 2 View Result Date: 04/08/2023 CLINICAL DATA:  Shortness of breath. Bilateral leg swelling. Weight gain. EXAM: CHEST - 2 VIEW COMPARISON:  03/18/2023 FINDINGS: Stable enlarged cardiac silhouette and tortuous aorta. Clear lungs with normal vascularity. Interval small right pleural effusion. Unremarkable bones IMPRESSION: 1. Interval small right pleural effusion. 2. Stable cardiomegaly. Electronically Signed   By: Beckie Salts M.D.   On: 04/08/2023 18:07   CT Head Wo Contrast Result Date: 04/08/2023 CLINICAL DATA:  Subconjunctival hemorrhage EXAM: CT HEAD WITHOUT CONTRAST TECHNIQUE: Contiguous axial images were obtained from the base of the skull through the vertex without intravenous contrast. RADIATION DOSE REDUCTION: This exam was performed according to the departmental dose-optimization program which includes automated exposure control, adjustment  of the mA and/or kV according to patient size and/or use of  iterative reconstruction technique. COMPARISON:  01/20/2023 FINDINGS: Brain: Unchanged gliosis and encephalomalacia throughout the left hemisphere. No acute hemorrhage. Dural thickening underlying the craniotomy site is unchanged. Chronic ischemic white matter changes. Vascular: No hyperdense vessel or unexpected vascular calcification. Skull: The visualized skull base, calvarium and extracranial soft tissues are normal. Sinuses/Orbits: No fluid levels or advanced mucosal thickening of the visualized paranasal sinuses. No mastoid or middle ear effusion. Normal orbits. Other: None. IMPRESSION: 1. No acute intracranial abnormality. 2. Unchanged gliosis and encephalomalacia throughout the left hemisphere. Electronically Signed   By: Deatra Robinson M.D.   On: 04/08/2023 15:53     Assessment and Plan:   Acute on chronic HFpEF Recent admission in February 2025, discharged on 80 mg of Lasix.  Reports decent output on this but has had progressive accumulation of peripheral edema and shortness of breath.  Reports dry weight being around 280.  Today he is 287.  Still volume up, however diuresing well on IV Lasix 60 mg.  -1.8 L in last 24 hours. Continue with IV Lasix 60 mg every 12 hours Suspect he either needs twice daily dosing of his p.o. Lasix 80 mg or consider torsemide at discharge. GDMT: Jardiance 10 mg, hydralazine 50 mg twice daily (increase to TID) , Imdur 120 mg  Persistant atrial fibrillation Last noted to be in sinus in December 2024.  Currently rate controlled with heart rates 80s to 90s.  Asymptomatic. Continue with Eliquis 5 mg twice daily, chronically has been on Multaq 400 mg twice daily.  Multaq would be contraindicated given recent missions with decompensated heart failure, would discontinue Continue carvedilol 25 mg for rate control  CKD  Creatinine 1.8, will monitor with diuresis  Hyperlipidemia On atorvastatin 80mg .  Subconjunctival hemorrhage CVA with residual right-sided  hemiparesis/aphasia Chronic lower extremity wounds DM, A1c 7.5% Per primary team.   Risk Assessment/Risk Scores:   New York Heart Association (NYHA) Functional Class NYHA Class III  CHA2DS2-VASc Score = 5  This indicates a 7.2% annual risk of stroke. The patient's score is based upon: CHF History: 1 HTN History: 1 Diabetes History: 1 Stroke History: 2 Vascular Disease History: 0 Age Score: 0 Gender Score: 0    For questions or updates, please contact [facility] Please consult [email or web address] for contact info under    Signed, Abagail Kitchens, PA-C  04/09/2023 12:02 PM   Patient seen and examined.  Agree with above documentation.  Justin Molina is a 63 year old male with history of chronic diastolic heart failure, atrial fibrillation, CVA with right-sided hemiparesis, wheelchair-bound, T2DM who we are consulted by Dr. Jarvis Newcomer for evaluation of heart failure.  Recent admission from 2/10 through 03/21/2023 with acute on chronic diastolic heart failure.  Diuresed with IV Lasix and discharged on p.o. Lasix.  Most recent echocardiogram 01/19/2023 showed EF 60 to 65%.  Since his discharge, he reports progressive lower extremity edema and shortness of breath.  Lasix was increased as an outpatient from 40 to 80 mg daily.  Continue to have worsening dyspnea, prompting presentation to ED yesterday.  In the ED, initial vital signs notable for BP 150/99, pulse 86, SpO2 97% on room air.  Labs notable for creatinine 1.88, increased from 1.59 on 03/21/2023, BNP 747, hemoglobin 11.2, troponin 23 > 23.On exam, patient is alert, word finding difficulties, irregular rhythm, normal rate, no murmurs, diminished breath sounds, 1+ LE edema, + JVD.   For his acute on chronic  diastolic heart failure, he was started on IV Lasix 60 mg twice daily.  Remains volume overloaded on exam, would continue IV Lasix.  He is on Multaq for persistent atrial fibrillation.  In light of his multiple recent admissions with  decompensated heart failure, Multaq is contraindicated.  Will discontinue.  Continue rate control strategy with carvedilol 25 mg twice daily.  Little Ishikawa, MD

## 2023-04-09 NOTE — Evaluation (Signed)
 Occupational Therapy Evaluation Patient Details Name: Justin Molina. MRN: [id number] DOB: Aug 04, 1960 Today's Date: 04/09/2023   History of Present Illness   Pt is a 63 y/o M presenting to ED on 3/3 with orthopnea, dyspnea, and BLE edema, admittedf or acute on chronic HFpEF. Recent admission for CHF exacerbation 2/10-2/13-2025. PMH includes HFpEF, HTN, HLD, CKD IIIA, A fib, ICH with R hemiparesis, venous insufficiency, NIDT2DM, obesity     Clinical Impressions Pt reports ind at baseline with ADLs and uses hemiwalker for mobility, pt lives with spouse who assists with IADLs. Pt currently needing up to min A for ADLs, CGA for bed mobility and CGA for transfers with hemi walker. Pt compensating well with LUE, no AROM and flexor synergy noted for RUE. Pt reports having a splint at home for RUE and wears nightly. Pt able to perform LB ADL and stand at sink for grooming task, 2/4 DOE noted but SpO2 in 90s on RA. Pt presenting with impairments listed below, will follow acutely. Recommend HHOT at d/c.      If plan is discharge home, recommend the following:   A little help with walking and/or transfers;A little help with bathing/dressing/bathroom;Assistance with cooking/housework;Assist for transportation;Assistance with feeding     Functional Status Assessment   Patient has had a recent decline in their functional status and demonstrates the ability to make significant improvements in function in a reasonable and predictable amount of time.     Equipment Recommendations   None recommended by OT     Recommendations for Other Services   PT consult     Precautions/Restrictions   Precautions Precautions: Fall Precaution/Restrictions Comments: R hemi Restrictions Weight Bearing Restrictions Per Provider Order: No     Mobility Bed Mobility Overal bed mobility: Needs Assistance Bed Mobility: Supine to Sit     Supine to sit: Contact guard          Transfers Overall  transfer level: Needs assistance Equipment used: Hemi-walker Transfers: Sit to/from Stand Sit to Stand: Contact guard assist                  Balance Overall balance assessment: Needs assistance Sitting-balance support: Single extremity supported Sitting balance-Leahy Scale: Good Sitting balance - Comments: reaches down toward feet/outside BOS without LOB   Standing balance support: Reliant on assistive device for balance, During functional activity, Single extremity supported Standing balance-Leahy Scale: Fair                             ADL either performed or assessed with clinical judgement   ADL Overall ADL's : Needs assistance/impaired     Grooming: Oral care;Contact guard assist;Standing Grooming Details (indicate cue type and reason): standing at sink Upper Body Bathing: Minimal assistance   Lower Body Bathing: Minimal assistance   Upper Body Dressing : Minimal assistance   Lower Body Dressing: Minimal assistance   Toilet Transfer: Contact guard assist;Ambulation Toilet Transfer Details (indicate cue type and reason): with use of hemi walker Toileting- Clothing Manipulation and Hygiene: Contact guard assist;Sitting/lateral lean;Sit to/from stand       Functional mobility during ADLs: Contact guard assist (hemi walker)       Vision   Vision Assessment?: No apparent visual deficits     Perception Perception: Not tested       Praxis Praxis: Not tested       Pertinent Vitals/Pain Pain Assessment Pain Assessment: No/denies pain     Extremity/Trunk Assessment Upper  Extremity Assessment Upper Extremity Assessment: RUE deficits/detail RUE Deficits / Details: mild edema, flexor tone noted no AROM noted, PROM to shoulder ~30* abd, reports he has a splint at home that he wears at night RUE: Subluxation noted RUE Sensation: decreased light touch;decreased proprioception RUE Coordination: decreased fine motor;decreased gross motor   Lower  Extremity Assessment Lower Extremity Assessment: Defer to PT evaluation   Cervical / Trunk Assessment Cervical / Trunk Assessment: Normal   Communication Communication Communication: Impaired Factors Affecting Communication: Difficulty expressing self;Reduced clarity of speech   Cognition Arousal: Alert Behavior During Therapy: WFL for tasks assessed/performed Cognition: No apparent impairments                               Following commands: Intact       Cueing  General Comments   Cueing Techniques: Visual cues;Verbal cues  SpO2 mid 90's on RA at end of session   Exercises     Shoulder Instructions      Home Living Family/patient expects to be discharged to:: Private residence Living Arrangements: Spouse/significant other Available Help at Discharge: Family;Available 24 hours/day Type of Home: House Home Access: Level entry     Home Layout: One level     Bathroom Shower/Tub: Tub/shower unit         Home Equipment: Tub bench;Wheelchair - manual;BSC/3in1;Wheelchair - power;Hospital bed;Other (comment) (hemi walker)          Prior Functioning/Environment Prior Level of Function : Needs assist             Mobility Comments: hemi walker at all times ADLs Comments: ind with ADLs, wife helps with IADLs    OT Problem List: Obesity;Decreased activity tolerance;Impaired UE functional use;Cardiopulmonary status limiting activity;Impaired sensation;Decreased coordination;Impaired tone   OT Treatment/Interventions: Self-care/ADL training;Therapeutic activities;Neuromuscular education;Energy conservation;Patient/family education;Balance training;DME and/or AE instruction;Manual therapy      OT Goals(Current goals can be found in the care plan section)   Acute Rehab OT Goals Patient Stated Goal: none stated OT Goal Formulation: With patient Time For Goal Achievement: 04/23/23 Potential to Achieve Goals: Good ADL Goals Pt Will Perform  Tub/Shower Transfer: Tub transfer;Shower transfer;with modified independence;ambulating Additional ADL Goal #1: pt will identify x3 CHF/energy conservation strategies in prep for ADLs Additional ADL Goal #2: pt will tolerate OOB activity x15 min in order to improve activity tolerance for ADLs   OT Frequency:  Min 1X/week    Co-evaluation              AM-PAC OT "6 Clicks" Daily Activity     Outcome Measure Help from another person eating meals?: A Little Help from another person taking care of personal grooming?: A Little Help from another person toileting, which includes using toliet, bedpan, or urinal?: A Little Help from another person bathing (including washing, rinsing, drying)?: A Little Help from another person to put on and taking off regular upper body clothing?: A Little Help from another person to put on and taking off regular lower body clothing?: A Little 6 Click Score: 18   End of Session Equipment Utilized During Treatment: Gait belt;Other (comment) (hemi walker) Nurse Communication: Mobility status  Activity Tolerance: Patient tolerated treatment well Patient left: in chair;with call bell/phone within reach;with chair alarm set  OT Visit Diagnosis: Unsteadiness on feet (R26.81);Muscle weakness (generalized) (M62.81);Cognitive communication deficit (R41.841);Hemiplegia and hemiparesis Hemiplegia - Right/Left: Right Hemiplegia - dominant/non-dominant: Dominant Hemiplegia - caused by: Nontraumatic intracerebral hemorrhage  Time: 1610-9604 OT Time Calculation (min): 32 min Charges:  OT General Charges $OT Visit: 1 Visit OT Evaluation $OT Eval Moderate Complexity: 1 Mod OT Treatments $Self Care/Home Management : 8-22 mins   Carver Fila, OTD, OTR/L SecureChat Preferred Acute Rehab [phone]   Carver Fila Koonce 04/09/2023, 4:25 PM

## 2023-04-09 NOTE — Plan of Care (Signed)
  Problem: Education: Goal: Knowledge of General Education information will improve Description: Including pain rating scale, medication(s)/side effects and non-pharmacologic comfort measures Outcome: Not Progressing   Problem: Health Behavior/Discharge Planning: Goal: Ability to manage health-related needs will improve Outcome: Not Progressing   Problem: Clinical Measurements: Goal: Ability to maintain clinical measurements within normal limits will improve Outcome: Not Progressing Goal: Will remain free from infection Outcome: Not Progressing Goal: Diagnostic test results will improve Outcome: Not Progressing Goal: Respiratory complications will improve Outcome: Not Progressing Goal: Cardiovascular complication will be avoided Outcome: Not Progressing   Problem: Activity: Goal: Risk for activity intolerance will decrease Outcome: Not Progressing   Problem: Nutrition: Goal: Adequate nutrition will be maintained Outcome: Not Progressing   Problem: Coping: Goal: Level of anxiety will decrease Outcome: Not Progressing   Problem: Elimination: Goal: Will not experience complications related to bowel motility Outcome: Not Progressing Goal: Will not experience complications related to urinary retention Outcome: Not Progressing   Problem: Pain Managment: Goal: General experience of comfort will improve and/or be controlled Outcome: Not Progressing   Problem: Safety: Goal: Ability to remain free from injury will improve Outcome: Not Progressing   Problem: Skin Integrity: Goal: Risk for impaired skin integrity will decrease Outcome: Not Progressing   Problem: Education: Goal: Ability to demonstrate management of disease process will improve Outcome: Not Progressing Goal: Ability to verbalize understanding of medication therapies will improve Outcome: Not Progressing Goal: Individualized Educational Video(s) Outcome: Not Progressing   Problem: Activity: Goal:  Capacity to carry out activities will improve Outcome: Not Progressing   Problem: Cardiac: Goal: Ability to achieve and maintain adequate cardiopulmonary perfusion will improve Outcome: Not Progressing

## 2023-04-10 DIAGNOSIS — I6930 Unspecified sequelae of cerebral infarction: Secondary | ICD-10-CM | POA: Diagnosis not present

## 2023-04-10 DIAGNOSIS — E1169 Type 2 diabetes mellitus with other specified complication: Secondary | ICD-10-CM

## 2023-04-10 DIAGNOSIS — E785 Hyperlipidemia, unspecified: Secondary | ICD-10-CM

## 2023-04-10 DIAGNOSIS — I4819 Other persistent atrial fibrillation: Secondary | ICD-10-CM | POA: Diagnosis not present

## 2023-04-10 DIAGNOSIS — I5033 Acute on chronic diastolic (congestive) heart failure: Secondary | ICD-10-CM | POA: Diagnosis not present

## 2023-04-10 DIAGNOSIS — N183 Chronic kidney disease, stage 3 unspecified: Secondary | ICD-10-CM | POA: Diagnosis not present

## 2023-04-10 DIAGNOSIS — S81801D Unspecified open wound, right lower leg, subsequent encounter: Secondary | ICD-10-CM

## 2023-04-10 LAB — MAGNESIUM: Magnesium: 2.2 mg/dL (ref 1.7–2.4)

## 2023-04-10 LAB — BASIC METABOLIC PANEL
Anion gap: 10 (ref 5–15)
BUN: 20 mg/dL (ref 8–23)
CO2: 30 mmol/L (ref 22–32)
Calcium: 8 mg/dL — ABNORMAL LOW (ref 8.9–10.3)
Chloride: 107 mmol/L (ref 98–111)
Creatinine, Ser: 1.97 mg/dL — ABNORMAL HIGH (ref 0.61–1.24)
GFR, Estimated: 38 mL/min — ABNORMAL LOW (ref >60)
Glucose, Bld: 85 mg/dL (ref 70–99)
Potassium: 3.7 mmol/L (ref 3.5–5.1)
Sodium: 147 mmol/L — ABNORMAL HIGH (ref 135–145)

## 2023-04-10 LAB — GLUCOSE, CAPILLARY
Glucose-Capillary: 104 mg/dL — ABNORMAL HIGH (ref 70–99)
Glucose-Capillary: 67 mg/dL — ABNORMAL LOW (ref 70–99)
Glucose-Capillary: 83 mg/dL (ref 70–99)
Glucose-Capillary: 94 mg/dL (ref 70–99)
Glucose-Capillary: 104 mg/dL — ABNORMAL HIGH (ref 70–99)

## 2023-04-10 MED ORDER — POTASSIUM CHLORIDE CRYS ER 20 MEQ PO TBCR
40.0000 meq | EXTENDED_RELEASE_TABLET | Freq: Once | ORAL | Status: AC
  Administered 2023-04-10: 40 meq via ORAL
  Filled 2023-04-10: qty 2

## 2023-04-10 MED ORDER — AMLODIPINE BESYLATE 5 MG PO TABS
5.0000 mg | ORAL_TABLET | Freq: Every day | ORAL | Status: DC
  Administered 2023-04-10: 5 mg via ORAL
  Filled 2023-04-10: qty 1

## 2023-04-10 MED ORDER — FUROSEMIDE 10 MG/ML IJ SOLN
60.0000 mg | Freq: Every day | INTRAMUSCULAR | Status: DC
  Administered 2023-04-11 – 2023-04-15 (×5): 60 mg via INTRAVENOUS
  Filled 2023-04-10 (×5): qty 6

## 2023-04-10 MED ORDER — POLYETHYLENE GLYCOL 3350 17 G PO PACK
17.0000 g | PACK | Freq: Every day | ORAL | Status: DC
  Administered 2023-04-10 – 2023-04-15 (×6): 17 g via ORAL
  Filled 2023-04-10 (×7): qty 1

## 2023-04-10 MED ORDER — SENNA 8.6 MG PO TABS
1.0000 | ORAL_TABLET | Freq: Every day | ORAL | Status: DC
  Administered 2023-04-10 – 2023-04-15 (×6): 8.6 mg via ORAL
  Filled 2023-04-10 (×6): qty 1

## 2023-04-10 NOTE — Assessment & Plan Note (Addendum)
 CKD stage 3a. Hypokalemia and hypernatremia.   Today renal function with serum cr at 1,65 with K at 3,3 and serum bicarbonate at 27  Na 142 and Mg 2.1   Add 40 meq Kcl x 2 doses and follow up renal function in am. Continue diuresis with furosemide, spironolactone and SGLT 2 inh.   Anemia of chronic renal disease, stable.

## 2023-04-10 NOTE — Hospital Course (Addendum)
 Mr. Spragg was admitted to the hospital with the working diagnosis of heart failure exacerbation.   63 y.o. 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  Recent hospitalization for CHF exacerbation 2/10-2/13, discharged on lasix 40mg  daily which was increased by his primary care provider about a week prior to admission to 80 mg daily with no significant improvement in his symptoms.  On his initial physical examination his blood pressure was 160/104, HR 53, RR 26 and 92 saturation 98%, lungs with no wheezing or rhonchi, heart with S1 and S2 present and regular with no gallops, abdomen with no distention and positive lower extremity edema.   Na 143, K 3.8 Cl 109, bicarbonate 26, glucose 83 bun 19 cr 1,88  BNP 746  Wbc 7,6 hgb 11.2 plt 212  Urine analysis SG 1,015, protein 30, glucose >500, small hgb and  negative leukocytes.   CT head with no acute intracranial abnormalities. Unchanged gliosis and encephalomalacia through the left hemisphere.   Chest radiograph with hypoinflation, positive cardiomegaly, bilateral hilar vascular congestion with cephalization of the vasculature, positive fluid in the right fissure, with small right pleural effusion.   EKG 95 bpm, left axis deviation, qtc 540, atrial fibrillation rhythm with no significant ST segment or  T wave changes.   Patient has been placed on IV furosemide for diuresis.   03/08 responding well to diuresis.  03/09 continue to adjust his medical regimen.  03/10 continue adjusting blood pressure regimen, possible discharge home tomorrow.  03/11 patient with improved in volume status and blood pressure, he will need close follow up as outpatient.

## 2023-04-10 NOTE — Progress Notes (Addendum)
 Patient Name: Justin Molina. Date of Encounter: 04/10/2023 [facility] Cardiologist: None   Interval Summary  .    Patient without focal complaints this morning. Reports that both breathing and swelling in legs seem improved from admission.   Vital Signs .    Vitals:   04/10/23 0112 04/10/23 0323 04/10/23 0500 04/10/23 0757  BP: (!) 131/91  (!) 120/93 (!) 154/98  Pulse: (!) 59 80 100 90  Resp: 18   18  Temp: 98.3 F (36.8 C)  (!) 97.1 F (36.2 C) 98.7 F (37.1 C)  TempSrc: Oral  Oral Oral  SpO2: 96% 97% 94% 91%  Weight:  128 kg    Height:        Intake/Output Summary (Last 24 hours) at 04/10/2023 0930 Last data filed at 04/10/2023 0800 Gross per 24 hour  Intake 720 ml  Output 4445 ml  Net -3725 ml      04/10/2023    3:23 AM 04/09/2023    3:13 AM 04/08/2023    2:07 PM  Last 3 Weights  Weight (lbs) 282 lb 3 oz 287 lb 11.2 oz 262 lb 2 oz  Weight (kg) 128 kg 130.5 kg 118.9 kg      Telemetry/ECG    Persistent atrial fibrillation with controlled ventricular rates - Personally Reviewed  Physical Exam .   GEN: No acute distress.   Neck: No JVD Cardiac: irregularly irregular, no murmurs, rubs, or gallops.  Respiratory: Clear to auscultation bilaterally. GI: Soft, nontender, non-distended  MS: right leg in compression wrap with chronic RLE wound. Left leg with trace edema, wrinkled skin noted.   Assessment & Plan .     Justin Hilton Saephan. is a 63 y.o. male with a hx of chronic HFpEF, PFO, atrial fibrillation, hypertension, hyperlipidemia, CVA with residual right-sided hemiparesis/aphasia 2021, wheelchair-bound, CKD, type 2 diabetes who is being seen for the evaluation of CHF exacerbation at the request of Dr. Jarvis Newcomer.   Acute on chronic HFpEF Patient with EF 60-65% as of December 2024 TTE had recent admission 03/18/23-03/21/23 for acute on chronic HFpEF. He was given IV lasix, ultimately discharged home on PO lasix 40mg . Since discharge, patient with recurrent  LE edema and dyspnea. Lasix increased to 80mg  but without improvement in symptoms.  Patient now s/p IV lasix 80mg  x1 dose and 60mg  x3 doses. Net negative 5.6L. Weights are incongruent with urine output. Continue IV diuresis today with close monitoring of renal function. Appears patient is nearing euvolemic status. Consider Torsemide rather than Lasix at discharge given rapid re accumulation of volume upon most recent discharge.  Continue coreg 25mg  BID, Jardiance 10mg , Hydralazine 50mg  TID, Imdur 120mg . Unable to add MRA due to renal dysfunction. Given ongoing hypertension, consider ARB/ARNI closer to discharge when aggressive diuresis no longer needed, though not clear that renal function will allow for this to be added.   Hypertension BP elevated this admission. Improved today with increased hydralazine but still elevated. Continue GDMT as above. Will add back home Amlodipine 5mg  today.   Longstanding persistent atrial fibrillation Patient appears to have last been in sinus rhythm December 2024. Asymptomatic with rate controlled afib on admission. He was taking Multaq 400mg  BID on admission.  Multaq now held given recurrent CHF admissions. Will continue to use Coreg 25mg  BID for rate control. If he continues to have hypervolemia, would need to consider whether persistent afib is significant contributing factor. December TTE showed only mild LA dilation, so reasonable to suspect he might hold sinus  rhythm.  Continue Eliquis 5mg  BID.  CKD IIIb Creatinine baseline 1.6-2.0. Today has increased 1.81->1.97. Will continue diuresis cautiously.   Hyperlipidemia Continue home atorvastatin 80mg .   DM type II Per primary team.  BPH Continue Cardura per primary team.  For questions or updates, please contact [facility] Please consult [email or web address] for contact info under      Signed, Perlie Gold, PA-C   Patient seen and examined.  Agree with above documentation.  On exam, patient is  alert, word finding difficulties, irregular rhythm, normal rate, no murmurs, diminished breath sounds, + JVD, Ace wrap around right leg.  Net -2.8 L yesterday.  Mild bump in creatinine (1.88 > 1.81 > 1.97).  Remains volume overloaded on exam.  With mild bump in creatinine and increase sodium, will decrease IV Lasix dosing to once daily today.  Little Ishikawa, MD

## 2023-04-10 NOTE — Assessment & Plan Note (Addendum)
 Continue rate control with carvedilol and anticoagulation with apixaban.

## 2023-04-10 NOTE — Plan of Care (Signed)
 Pt heart rate still irregular and tachy at times.

## 2023-04-10 NOTE — Evaluation (Signed)
 Physical Therapy Evaluation Patient Details Name: Justin Molina. MRN: [id number] DOB: May 10, 1960 Today's Date: 04/10/2023  History of Present Illness  Pt is a 63 y.o. M presenting to ED on 3/3 with orthopnea, dyspnea, and BLE edema, admitted for acute on chronic HFpEF. Recent admission for CHF exacerbation 2/10-2/13-2025. PMH includes HFpEF, HTN, HLD, CKD IIIA, A fib, ICH with R hemiparesis, venous insufficiency, NIDT2DM, obesity.   Clinical Impression  Pt admitted with above diagnosis. PTA, pt was ModI with mobility using a hemi-walker, independent with ADLs, and relied on family assistance for IADLs. He resides with his wife in a level entry apartment. Pt currently with functional limitations due to the deficits listed below (see PT Problem List). He required CGA for all functional mobility using hemi-walker and performed transfers from an elevated height. Cued pt on PLB techniques. He denied SOB/DOE and SpO2 >90% on RA. Pt will benefit from acute skilled PT to increase his independence and safety with mobility to allow d/c Home with HHPT.        If plan is discharge home, recommend the following: A little help with walking and/or transfers;A little help with bathing/dressing/bathroom;Assistance with cooking/housework;Assist for transportation   Can travel by private vehicle        Equipment Recommendations None recommended by PT (Pt already has DME)  Recommendations for Other Services       Functional Status Assessment Patient has had a recent decline in their functional status and demonstrates the ability to make significant improvements in function in a reasonable and predictable amount of time.     Precautions / Restrictions Precautions Precautions: Fall Precaution/Restrictions Comments: R hemi Restrictions Weight Bearing Restrictions Per Provider Order: No      Mobility  Bed Mobility Overal bed mobility: Needs Assistance Bed Mobility: Supine to Sit     Supine to  sit: Used rails, Supervision     General bed mobility comments: Pt got up on R side of bed and instructed PT on how to utilize bed features as he has a hospital bed at home. Pt sat up from a flat bed with use of bedrail    Transfers Overall transfer level: Needs assistance Equipment used: Hemi-walker Transfers: Sit to/from Stand, Bed to chair/wheelchair/BSC Sit to Stand: Contact guard assist, From elevated surface   Step pivot transfers: Contact guard assist       General transfer comment: STS from raised bed height. Cued pt to increase fwd lean to help power up. Pt kept LUE on hemiwalker. Transfer to recliner chair with pillow added to raise seat height. Pt demonstrated fair eccentric control reaching back with L hand to armrest.    Ambulation/Gait Ambulation/Gait assistance: Contact guard assist Gait Distance (Feet): 80 Feet (1x15, stood to use bathroom, 1x3, to sink to wash hands, 1x80, exiting room to reach nursing station and return to recliner chair) Assistive device: Hemi-walker Gait Pattern/deviations: Step-to pattern, Decreased stride length, Knee hyperextension - right, Trunk flexed, Decreased weight shift to right, Decreased stance time - right Gait velocity: reduced Gait velocity interpretation: <1.8 ft/sec, indicate of risk for recurrent falls   General Gait Details: Pt ambulated with hemiwalker at a diagonal on his left. He utilized a step-to pattern. He maintained increased WBing on LHB. R foot drop compensated for by increase hip flex, and RLE hyperextension. Cued pt on PLB techniques during mobility. He denied SOB/DOE.  Stairs            Wheelchair Mobility     Tilt Bed  Modified Rankin (Stroke Patients Only)       Balance Overall balance assessment: Needs assistance Sitting-balance support: Single extremity supported Sitting balance-Leahy Scale: Fair Sitting balance - Comments: Pt sat EOB with supervision.   Standing balance support: Reliant on  assistive device for balance, Single extremity supported Standing balance-Leahy Scale: Fair Standing balance comment: Pt depends on LUE support on hemiwalker. He was stable in static stance to use the restroom. No overt LOB.                             Pertinent Vitals/Pain Pain Assessment Pain Assessment: No/denies pain    Home Living Family/patient expects to be discharged to:: Private residence Living Arrangements: Spouse/significant other Available Help at Discharge: Family;Available 24 hours/day Type of Home: Apartment Home Access: Level entry       Home Layout: One level Home Equipment: Tub bench;Wheelchair - manual;BSC/3in1;Wheelchair - power;Hospital bed;Other (comment) (Hemiwalker)      Prior Function Prior Level of Function : Independent/Modified Independent             Mobility Comments: ModI using hemiwalker. ADLs Comments: Indep with ADLs. Relies on wife for IADLs including driving and medication management     Extremity/Trunk Assessment   Upper Extremity Assessment Upper Extremity Assessment: Defer to OT evaluation    Lower Extremity Assessment Lower Extremity Assessment: Overall WFL for tasks assessed    Cervical / Trunk Assessment Cervical / Trunk Assessment: Normal  Communication   Communication Communication: Impaired Factors Affecting Communication: Difficulty expressing self;Reduced clarity of speech (This is baseline. Pt would say yes/no and gesture to help get his point across.)    Cognition Arousal: Alert Behavior During Therapy: [facility] for tasks assessed/performed   PT - Cognitive impairments: No apparent impairments                       PT - Cognition Comments: Pt A,Ox4. He responded to questions by air drawing what he was trying to say or selecting from multiple choice options. Following commands: Intact       Cueing Cueing Techniques: Verbal cues     General Comments General comments (skin integrity, edema,  etc.): During session, HR 100-125bpm and SpO2 >90% on RA.    Exercises     Assessment/Plan    PT Assessment Patient needs continued PT services  PT Problem List Decreased activity tolerance;Decreased balance;Cardiopulmonary status limiting activity;Decreased mobility       PT Treatment Interventions Gait training;Functional mobility training;Therapeutic activities;Therapeutic exercise;Balance training;Neuromuscular re-education;Patient/family education    PT Goals (Current goals can be found in the Care Plan section)  Acute Rehab PT Goals Patient Stated Goal: Return Home PT Goal Formulation: With patient Time For Goal Achievement: 04/24/23 Potential to Achieve Goals: Good    Frequency Min 2X/week     Co-evaluation               AM-PAC PT "6 Clicks" Mobility  Outcome Measure Help needed turning from your back to your side while in a flat bed without using bedrails?: A Little Help needed moving from lying on your back to sitting on the side of a flat bed without using bedrails?: A Little Help needed moving to and from a bed to a chair (including a wheelchair)?: A Little Help needed standing up from a chair using your arms (e.g., wheelchair or bedside chair)?: A Little Help needed to walk in hospital room?: A Little Help needed climbing  3-5 steps with a railing? : A Lot 6 Click Score: 17    End of Session Equipment Utilized During Treatment: Gait belt Activity Tolerance: Patient tolerated treatment well Patient left: in chair;with call bell/phone within reach;with chair alarm set Nurse Communication: Mobility status PT Visit Diagnosis: Other abnormalities of gait and mobility (R26.89);Unsteadiness on feet (R26.81)    Time: 6578-4696 PT Time Calculation (min) (ACUTE ONLY): 27 min   Charges:   PT Evaluation $PT Eval Moderate Complexity: 1 Mod PT Treatments $Gait Training: 8-22 mins PT General Charges $$ ACUTE PT VISIT: 1 Visit         Cheri Guppy, PT,  DPT Acute Rehabilitation Services Office: [phone] Secure Chat Preferred  Richardson Chiquito 04/10/2023, 11:42 AM

## 2023-04-10 NOTE — Assessment & Plan Note (Addendum)
 Glucose remained stable, patient was placed on insulin sliding scale during his hospitalization.  On the day of discharge his fasting glucose was 80 mg/dl.   Continue with statin therapy

## 2023-04-10 NOTE — Assessment & Plan Note (Signed)
 Continue local wound care.

## 2023-04-10 NOTE — Assessment & Plan Note (Addendum)
 Echocardiogram with preserved LV systolic function with EF 60 to 65%, mild LVH, RV systolic function preserved, no significant valvular disease. Trivial pericardial effusion. Mild LA dilatation.   Urine output is 2,550 ml Systolic blood pressure 140 with diastolic in the 90 to 100 mmHg range.  Diuresis with furosemide 60 mg IV daily. Carvedilol, hydralazine and isosorbide (dose increased to 100 mg tid).  Spironolactone and SGLT 2 inh.  To consider transition to oral loop diuretic.  Limited pharmacologic options due to acutely reduced GFR

## 2023-04-10 NOTE — Progress Notes (Signed)
  Progress Note   Patient: Justin Molina. ZOX:[id number] DOB: Sep 26, 1960 DOA: 04/08/2023     1 DOS: the patient was seen and examined on 04/10/2023   Brief hospital course: Justin Molina was admitted to the hospital with the working diagnosis of heart failure exacerbation.    63 y.o. 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  He had been admitted for CHF exacerbation 2/10-2/13, discharged on lasix 40mg  daily which was increased by his PCP about a week ago. .   Assessment and Plan: * Acute on chronic diastolic CHF (congestive heart failure) (HCC) Echocardiogram with preserved LV systolic function with EF 60 to 65%, mild LVH, RV systolic function preserved, no significant valvular disease. Trivial pericardial effusion. Mild LA dilatation.   Urine output is 4,245 ml Systolic blood pressure 150 mmHg range.  Plan to continue diuresis with furosemide 60 mg IV daily.  Carvedilol, hydralazine and isosorbide.  Limited pharmacologic options due to acutely reduced GFR  Chronic atrial fibrillation with RVR (HCC) Continue rate control with carvedilol and anticoagulation with apixaban.  Continue telemetry monitoring.   CKD (chronic kidney disease) stage 3, GFR 30-59 ml/min (HCC) CKD stage 3a. Hypokalemia and hypernatremia.   Renal function with serum cr at 1.97 with K at 3,7 and serum bicarbonate at 30  Na 147 and Mg 2.2   Continue diuresis with furosemide and follow up renal function and electrolytes in am.   Anemia of chronic renal disease, stable.   Type 2 diabetes mellitus with hyperlipidemia (HCC) Continue glucose cover and monitoring with insulin sliding scale.  Fasting gluocse this morning 85 mg/dl.   Continue with statin therapy  History of CVA with residual  deficit Continue blood pressure control, statin and anticoagulation with apixaban.   Open wound of right lower extremity Continue local wound care.         Subjective: Patient is feeling better, dyspnea and edema are improving, no chest pain, today is out of bed to the chair   Physical Exam: Vitals:   04/10/23 0323 04/10/23 0500 04/10/23 0757 04/10/23 1120  BP:  (!) 120/93 (!) 154/98 (!) 143/90  Pulse: 80 100 90 88  Resp:   18 18  Temp:  (!) 97.1 F (36.2 C) 98.7 F (37.1 C) 98.5 F (36.9 C)  TempSrc:  Oral Oral Oral  SpO2: 97% 94% 91% 92%  Weight: 128 kg     Height:       Neurology awake and alert ENT With mild pallor with no icterus Cardiovascular with S1 and s2 present and regular with no gallops, rubs or murmurs Respiratory with no  wheezing, with no rhonchi, positive mild rales at bases.  Abdomen with no distention  Positive lower extremity edema + unna boots in place.    Data Reviewed:    Family Communication: no family at the bedside   Disposition: Status is: Inpatient Remains inpatient appropriate because: diuresis   Planned Discharge Destination: Home      Author: Coralie Keens, MD 04/10/2023 4:41 PM  For on call review [email or web address].

## 2023-04-10 NOTE — TOC Initial Note (Signed)
 Transition of Care (TOC) - Initial/Assessment Note    Patient Details  Name: Justin Molina. MRN: [id number] Date of Birth: Dec 01, 1960  Transition of Care [facility]) CM/SW Contact:    Leone Haven, RN Phone Number: 04/10/2023, 4:34 PM  Clinical Narrative:                 From home with spouse, he gives this NCM permission to call wife, NCM contacted wife she states  has PCP and insurance on file, states he is active with [facility] for [facility], HHPT, HHOT and they would like to continue with them.  NCM confirmed this with Lynette.   States she will transport them home at [employer] and family is support system, states gets medications from [facility] in [place] on [address]  Pta self ambulatory with walker.  She states he was also going to [facility].  Expected Discharge Plan: Home w Home Health Services Barriers to Discharge: Continued Medical Work up   Patient Goals and CMS Choice Patient states their goals for this hospitalization and ongoing recovery are:: return home with wife CMS Medicare.gov Compare Post Acute Care list provided to:: Patient Represenative (must comment) Choice offered to / list presented to : Spouse      Expected Discharge Plan and Services In-house Referral: NA Discharge Planning Services: CM Consult Post Acute Care Choice: Home Health Living arrangements for the past 2 months: Apartment                 DME Arranged: N/A DME Agency: NA       HH Arranged: RN, PT, OT HH Agency: [facility] Date HH Agency Contacted: 04/10/23 Time HH Agency Contacted: 1633 Representative spoke with at [facility] Agency: Haywood Lasso  Prior Living Arrangements/Services Living arrangements for the past 2 months: Apartment Lives with:: Spouse Patient language and need for interpreter reviewed:: Yes Do you feel safe going back to the place where you live?: Yes      Need for Family Participation in Patient Care: Yes (Comment) Care giver support system  in place?: Yes (comment) Current home services: DME (hosp bed, w/chair, hemi walker, 3 n 1) Criminal Activity/Legal Involvement Pertinent to Current Situation/Hospitalization: No - Comment as needed  Activities of Daily Living   ADL Screening (condition at time of admission) Independently performs ADLs?: No Does the patient have a NEW difficulty with bathing/dressing/toileting/self-feeding that is expected to last >3 days?: No Does the patient have a NEW difficulty with getting in/out of bed, walking, or climbing stairs that is expected to last >3 days?: No Does the patient have a NEW difficulty with communication that is expected to last >3 days?: No Is the patient deaf or have difficulty hearing?: No Does the patient have difficulty seeing, even when wearing glasses/contacts?: No Does the patient have difficulty concentrating, remembering, or making decisions?: Yes  Permission Sought/Granted Permission sought to share information with : Case Manager Permission granted to share information with : Yes, Verbal Permission Granted  Share Information with NAME: wife  Permission granted to share info w AGENCY: HH  Permission granted to share info w Relationship: wife  Permission granted to share info w Contact Information: wife  Emotional Assessment Appearance:: Appears stated age Attitude/Demeanor/Rapport: Engaged Affect (typically observed): Appropriate Orientation: : Oriented to Self, Oriented to Place, Oriented to Situation, Oriented to  Time Alcohol / Substance Use: Not Applicable Psych Involvement: No (comment)  Admission diagnosis:  Acute on chronic diastolic congestive heart failure (HCC) [I50.33] Subconjunctival  hemorrhage of left eye [H11.32] Acute on chronic heart failure with preserved ejection fraction (HFpEF) (HCC) [I50.33] Chronic wound [T14.8XXA] Acute CHF (congestive heart failure) (HCC) [I50.9] Patient Active Problem List   Diagnosis Date Noted   Acute CHF  (congestive heart failure) (HCC) 04/09/2023   Class 2 obesity 03/19/2023   Open wound of right lower extremity 03/19/2023   Migraine headaches 03/19/2023   Acute on chronic heart failure with preserved ejection fraction (HFpEF) (HCC) 03/18/2023   Sepsis (HCC) 01/17/2023   Cellulitis 01/17/2023   Hypokalemia 12/27/2022   Heart failure with normal ejection fraction (HCC) 12/12/2022   Benign prostatic hyperplasia 01/05/2022   Coronary atherosclerosis 12/14/2021   Constipation 12/14/2021   Unspecified dementia, unspecified severity, without behavioral disturbance, psychotic disturbance, mood disturbance, and anxiety (HCC) 05/01/2021   Secondary hyperparathyroidism of renal origin (HCC) 02/01/2021   Hemiplegia and hemiparesis following cerebral infarction affecting right dominant side (HCC) 01/04/2021   Aphasia following cerebral infarction 01/04/2021   Unspecified atrial fibrillation (HCC) 01/04/2021   Class 3 severe obesity due to excess calories with body mass index (BMI) of 45.0 to 49.9 in adult (HCC) 10/03/2017   Aphasia 07/02/2017   OSA (obstructive sleep apnea) 01/16/2016   CKD (chronic kidney disease) stage 3, GFR 30-59 ml/min (HCC) 08/24/2014   HLD (hyperlipidemia) 08/24/2014   Anxiety 08/24/2014   DM (diabetes mellitus), type 2 with renal complications (HCC) 08/12/2014   Diabetic neuropathy (HCC) 08/12/2014   HTN (hypertension) 08/12/2014   History of CVA with residual deficit 08/12/2014   PCP:  Theodis Shove, DO Pharmacy:   [id number] - [place], [place] - [address] [place] [address] Phone: [phone] Fax: [phone]     Social Drivers of Health (SDOH) Social History: SDOH Screenings   Food Insecurity: No Food Insecurity (04/09/2023)  Housing: Low Risk  (04/09/2023)  Transportation Needs: No Transportation Needs (04/09/2023)  Utilities: Not At Risk (04/09/2023)  Tobacco Use: Low  Risk  (04/09/2023)   SDOH Interventions:     Readmission Risk Interventions    04/10/2023    4:30 PM  Readmission Risk Prevention Plan  Transportation Screening Complete  PCP or Specialist Appt within 3-5 Days Complete  HRI or Home Care Consult Complete  Palliative Care Screening Not Applicable  Medication Review (RN Care Manager) Complete

## 2023-04-10 NOTE — Assessment & Plan Note (Addendum)
 Continue blood pressure control, statin and anticoagulation with apixaban.  Left sided weakness.

## 2023-04-11 DIAGNOSIS — I6930 Unspecified sequelae of cerebral infarction: Secondary | ICD-10-CM | POA: Diagnosis not present

## 2023-04-11 DIAGNOSIS — I5033 Acute on chronic diastolic (congestive) heart failure: Secondary | ICD-10-CM | POA: Diagnosis not present

## 2023-04-11 DIAGNOSIS — I4819 Other persistent atrial fibrillation: Secondary | ICD-10-CM | POA: Diagnosis not present

## 2023-04-11 DIAGNOSIS — E1169 Type 2 diabetes mellitus with other specified complication: Secondary | ICD-10-CM | POA: Diagnosis not present

## 2023-04-11 DIAGNOSIS — N183 Chronic kidney disease, stage 3 unspecified: Secondary | ICD-10-CM | POA: Diagnosis not present

## 2023-04-11 LAB — BASIC METABOLIC PANEL
Anion gap: 4 — ABNORMAL LOW (ref 5–15)
BUN: 16 mg/dL (ref 8–23)
CO2: 29 mmol/L (ref 22–32)
Calcium: 7.7 mg/dL — ABNORMAL LOW (ref 8.9–10.3)
Chloride: 108 mmol/L (ref 98–111)
Creatinine, Ser: 1.7 mg/dL — ABNORMAL HIGH (ref 0.61–1.24)
GFR, Estimated: 45 mL/min — ABNORMAL LOW (ref >60)
Glucose, Bld: 78 mg/dL (ref 70–99)
Potassium: 3.2 mmol/L — ABNORMAL LOW (ref 3.5–5.1)
Sodium: 141 mmol/L (ref 135–145)

## 2023-04-11 LAB — GLUCOSE, CAPILLARY
Glucose-Capillary: 67 mg/dL — ABNORMAL LOW (ref 70–99)
Glucose-Capillary: 122 mg/dL — ABNORMAL HIGH (ref 70–99)
Glucose-Capillary: 100 mg/dL — ABNORMAL HIGH (ref 70–99)
Glucose-Capillary: 96 mg/dL (ref 70–99)
Glucose-Capillary: 79 mg/dL (ref 70–99)

## 2023-04-11 LAB — MAGNESIUM: Magnesium: 2.1 mg/dL (ref 1.7–2.4)

## 2023-04-11 MED ORDER — POTASSIUM CHLORIDE CRYS ER 20 MEQ PO TBCR
40.0000 meq | EXTENDED_RELEASE_TABLET | Freq: Once | ORAL | Status: AC
  Administered 2023-04-11: 40 meq via ORAL
  Filled 2023-04-11: qty 2

## 2023-04-11 MED ORDER — POTASSIUM CHLORIDE CRYS ER 20 MEQ PO TBCR: 40.0000 meq | EXTENDED_RELEASE_TABLET | ORAL | Status: DC

## 2023-04-11 MED ORDER — AMLODIPINE BESYLATE 10 MG PO TABS
10.0000 mg | ORAL_TABLET | Freq: Every day | ORAL | Status: DC
  Administered 2023-04-11 – 2023-04-16 (×6): 10 mg via ORAL
  Filled 2023-04-11 (×6): qty 1

## 2023-04-11 MED ORDER — EMPAGLIFLOZIN 10 MG PO TABS
10.0000 mg | ORAL_TABLET | Freq: Every day | ORAL | Status: DC
  Administered 2023-04-12 – 2023-04-16 (×5): 10 mg via ORAL
  Filled 2023-04-11 (×6): qty 1

## 2023-04-11 MED ORDER — POTASSIUM CHLORIDE CRYS ER 20 MEQ PO TBCR
40.0000 meq | EXTENDED_RELEASE_TABLET | Freq: Two times a day (BID) | ORAL | Status: DC
  Administered 2023-04-11: 40 meq via ORAL
  Filled 2023-04-11: qty 2

## 2023-04-11 NOTE — Progress Notes (Signed)
  Progress Note   Patient: Justin Molina. ZOX:[id number] DOB: Jul 24, 1960 DOA: 04/08/2023     2 DOS: the patient was seen and examined on 04/11/2023   Brief hospital course: Mr. Pucci was admitted to the hospital with the working diagnosis of heart failure exacerbation.    63 y.o. 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  He had been admitted for CHF exacerbation 2/10-2/13, discharged on lasix 40mg  daily which was increased by his PCP about a week prior to admission to 80 mg daily.  On his initial physical examination his blood pressure was 160/104, HR 53, RR 26 and 92 saturation 98%, lungs with no wheezing or rhonchi, heart with S1 and S2 present and regular with no gallops, abdomen with no distention and positive lower extremity edema.   Patient has been placed on IV furosemide for diuresis.   Assessment and Plan: * Acute on chronic diastolic CHF (congestive heart failure) (HCC) Echocardiogram with preserved LV systolic function with EF 60 to 65%, mild LVH, RV systolic function preserved, no significant valvular disease. Trivial pericardial effusion. Mild LA dilatation.   Urine output is 3,305 ml Systolic blood pressure 150 mmHg range.  Plan to continue diuresis with furosemide 60 mg IV daily.  Carvedilol, hydralazine and isosorbide.  Limited pharmacologic options due to acutely reduced GFR  Chronic atrial fibrillation with RVR (HCC) Continue rate control with carvedilol and anticoagulation with apixaban.  Continue telemetry monitoring.   CKD (chronic kidney disease) stage 3, GFR 30-59 ml/min (HCC) CKD stage 3a. Hypokalemia and hypernatremia.   Today serum cr is 1,70 with K at 3,2 and serum bicarbonate at 29  Na 141 and Mg 2.1   Continue diuresis with furosemide 60  mg IV daily. Add Kcl 40 meq x 2 doses Follow up renal function and electrolytes in am.   Anemia of chronic renal disease, stable.   Type 2 diabetes mellitus with hyperlipidemia (HCC) Continue glucose cover and monitoring with insulin sliding scale.  Fasting gluocse this morning 78 mg/dl.   Continue with statin therapy  History of CVA with residual deficit Continue blood pressure control, statin and anticoagulation with apixaban.   Open wound of right lower extremity Continue local wound care.         Subjective: Patient is feeling better edema and dyspnea continue to improve, no chest pain.   Physical Exam: Vitals:   04/11/23 0800 04/11/23 0820 04/11/23 0848 04/11/23 1145  BP:  (!) 139/103  (!) 150/102  Pulse:  86 89   Resp:  16    Temp: (!) 97 F (36.1 C) 97.7 F (36.5 C)    TempSrc:  Oral    SpO2:  96% 96%   Weight:      Height:       Neurology awake and alert ENT with mild pallor Cardiovascular with S1 and S2 present and regular with no gallops, rubs or murmurs Respiratory with mild rales at bases with no wheezing Abdomen with no distention  Positive lower extremity edema more right than left. +  Data Reviewed:    Family Communication: no family at the bedside   Disposition: Status is: Inpatient Remains inpatient appropriate because: IV diuresis   Planned Discharge Destination: Home      Author: Coralie Keens, MD 04/11/2023 12:02 PM  For on call review [email or web address].

## 2023-04-11 NOTE — Progress Notes (Addendum)
 Patient Name: Justin Molina. Date of Encounter: 04/11/2023 [facility] Cardiologist: None   Interval Summary  .    Patient feeling well this morning and has no focal complaints. Discussed plans for additional diuresis today based on physical exam.   Vital Signs .    Vitals:   04/11/23 0104 04/11/23 0404 04/11/23 0800 04/11/23 0820  BP: (!) 143/93 (!) 155/102  (!) 139/103  Pulse: 86 94  86  Resp: 18 20  16   Temp: 98.4 F (36.9 C) 97.8 F (36.6 C) (!) 97 F (36.1 C) 97.7 F (36.5 C)  TempSrc: Oral Oral  Oral  SpO2: 95% 95%    Weight:      Height:        Intake/Output Summary (Last 24 hours) at 04/11/2023 0843 Last data filed at 04/11/2023 0405 Gross per 24 hour  Intake 1267 ml  Output 2230 ml  Net -963 ml      04/10/2023    5:00 PM 04/10/2023    3:23 AM 04/09/2023    3:13 AM  Last 3 Weights  Weight (lbs) 273 lb 13 oz 282 lb 3 oz 287 lb 11.2 oz  Weight (kg) 124.2 kg 128 kg 130.5 kg      Telemetry/ECG    Persistent afib, rate controlled with ventricular rates 80s-low 100s - Personally Reviewed  Physical Exam .   GEN: No acute distress.   Neck: JVP midway to mandible with HOB at approx 30 degrees Cardiac: irregularly irregular, no murmurs, rubs, or gallops.  Respiratory: Clear to auscultation bilaterally. GI: Soft, nontender, non-distended  MS: No edema  Assessment & Plan .     Justin Avis Mcmahill. is a 63 y.o. male with a hx of chronic HFpEF, PFO, atrial fibrillation, hypertension, hyperlipidemia, CVA with residual right-sided hemiparesis/aphasia 2021, wheelchair-bound, CKD, type 2 diabetes who is being seen for the evaluation of CHF exacerbation at the request of Dr. Jarvis Newcomer.    Acute on chronic HFpEF Patient with EF 60-65% as of December 2024 TTE had recent admission 03/18/23-03/21/23 for acute on chronic HFpEF. He was given IV lasix, ultimately discharged home on PO lasix 40mg . Since discharge, patient with recurrent LE edema and dyspnea. Lasix  increased to 80mg  but without improvement in symptoms.  Patient now s/p IV lasix 80mg  x1 dose and 60mg  x3 doses. Net negative 6.6L. Weights are incongruent with urine output. Continue IV diuresis today with close monitoring of renal function. Patient is nearing euvolemic status but still has elevation of JVP. Consider Torsemide rather than Lasix at discharge given rapid re-accumulation of volume upon most recent discharge.  Continue coreg 25mg  BID Resume Jardiance 10mg  Continue Hydralazine 50mg  TID, Imdur 120mg . Not likely to be able to add MRA due to renal dysfunction. Given ongoing hypertension, consider ARB/ARNI closer to discharge when aggressive diuresis no longer needed, though not clear that renal function will allow for this to be added.    Hypertension BP elevated this admission. Improved today with increased hydralazine and Amlodipine but still elevated. Continue GDMT as above. Increase Amlodipine to 10mg .   Longstanding persistent atrial fibrillation Patient appears to have last been in sinus rhythm December 2024. Asymptomatic with rate controlled afib on admission. He was taking Multaq 400mg  BID on admission.  Multaq now held given recurrent CHF admissions. Will continue to use Coreg 25mg  BID for rate control. If he continues to have hypervolemia, would need to consider whether persistent afib is significant contributing factor. December TTE showed only mild LA dilation,  so reasonable to suspect he might hold sinus rhythm.  Continue Eliquis 5mg  BID.   CKD IIIb Creatinine baseline 1.6-2.0. Today improved 1.97->1.70. Will continue diuresis cautiously.    Hyperlipidemia Continue home atorvastatin 80mg .    DM type II Per primary team.   BPH Continue Cardura per primary team.  For questions or updates, please contact [facility] Please consult [email or web address] for contact info under   Signed, Perlie Gold, PA-C   Patient seen and examined.  Agree with above  documentation.  On exam, patient is alert and oriented, regular rate and rhythm, no murmurs, lungs CTAB, no LE edema, +JVD. Remains volume overloaded on exam, would continue IV Lasix 60 mg daily today.  Renal function improving, will continue to monitor.  Little Ishikawa, MD

## 2023-04-11 NOTE — Progress Notes (Signed)
 Occupational Therapy Treatment Patient Details Name: Justin Molina. MRN: [id number] DOB: 08-04-1960 Today's Date: 04/11/2023   History of present illness Pt is a 63 y.o. M presenting to ED on 3/3 with orthopnea, dyspnea, and BLE edema, admitted for acute on chronic HFpEF. Recent admission for CHF exacerbation 2/10-2/13-2025. PMH includes HFpEF, HTN, HLD, CKD IIIA, A fib, ICH with R hemiparesis, venous insufficiency, NIDT2DM, obesity.   OT comments  OT treatment focused on education regarding energy conservation strategies and strategies for improved management on CHF with handouts provided and pt and his wife indicating understanding of all education. OT also provided pt with and trained pt and wife in use of Right palm guard and monitoring pt skin with use to improve position and decrease risk of skin breakdown with pt and wife indicating and demonstrating understanding through teach back. Use palm guard and ongoing monitoring for signs of skin irritation and breakdown also discussed with RN. Pt participated well in session and is making progress toward goals. Pt's VSS on RA throughout session. Pt will benefit from continued acute skilled OT services to address deficits outlined below and increase safety and independence with functional tasks. Post acute discharge, pt will benefit from continued skilled OT services in the home to maximize rehab potential.       If plan is discharge home, recommend the following:  A little help with walking and/or transfers;A little help with bathing/dressing/bathroom;Assistance with cooking/housework;Assist for transportation;Assistance with feeding   Equipment Recommendations  None recommended by OT;Other (comment) (OT provided Right palm guard. No other equipment needs identified at this time.)    Recommendations for Other Services      Precautions / Restrictions Precautions Precautions: Fall Restrictions Weight Bearing Restrictions Per Provider Order:  No       Mobility Bed Mobility Overal bed mobility: Needs Assistance Bed Mobility: Sit to Supine       Sit to supine: Supervision, Used rails        Transfers                   General transfer comment: Pt sitting EOB with wife present upon OT arrival and supine in bed at end of session.     Balance Overall balance assessment: Needs assistance Sitting-balance support: Single extremity supported, Feet supported Sitting balance-Leahy Scale: Fair                                     ADL either performed or assessed with clinical judgement   ADL Overall ADL's : Needs assistance/impaired                                       General ADL Comments: OT provided education and related handouts to pt and his wife in energy conservation strategies for increased safety and independence in the home with pt and wife indicating understanding and that strategies are helpful. OT also provided specfic training to pt in pursed lip breathing with pt demonstrating understanding through teach back. Additionally, OT provided edcuation and handout related to strategies for improved management of CHF with pt and wife indicating understanding of all education.    Extremity/Trunk Assessment Upper Extremity Assessment Upper Extremity Assessment: Right hand dominant;RUE deficits/detail (Post-CVA, LT hand dominant due to necessity.) RUE Deficits / Details: mild edema, flexor tone noted no AROM noted,  PROM to shoulder ~30* abd, reports he has a splint at home that he wears at night   Lower Extremity Assessment Lower Extremity Assessment: Defer to PT evaluation        Vision       Perception     Praxis     Communication Communication Communication: Impaired Factors Affecting Communication: Difficulty expressing self (This is baseline. Pt would say yes/no and gesture to help get his point across.)   Cognition Arousal: Alert Behavior During Therapy: [facility]  for tasks assessed/performed Cognition: No apparent impairments             OT - Cognition Comments: Pt does very well using available speech and gestures to communicate wants and needs. Pt dysarthric but understood by OT.                 Following commands: Intact        Cueing      Exercises      Shoulder Instructions       General Comments OT provided Right palm guard and educated pt and wife in use, wear schedule, and to monitor for signs of irritation or skin breakdown with both indicating understanding. OT to review education and wear schedule and reassess pt during next session. OT also spoke with RN who states she will also monitor pt's Right hand for signs of irritation or skin breakdown with use of palm guard. Pt's VSS on RA throughout session. Pt's wife present throughout session. RN present during a portion of session.    Pertinent Vitals/ Pain          Home Living                                          Prior Functioning/Environment              Frequency  Min 2X/week        Progress Toward Goals  OT Goals(current goals can now be found in the care plan section)  Progress towards OT goals: Progressing toward goals  Acute Rehab OT Goals Patient Stated Goal: pt unable to state but indicated he is looking forward to going home with wife  Plan      Co-evaluation                 AM-PAC OT "6 Clicks" Daily Activity     Outcome Measure   Help from another person eating meals?: A Little Help from another person taking care of personal grooming?: A Little Help from another person toileting, which includes using toliet, bedpan, or urinal?: A Little Help from another person bathing (including washing, rinsing, drying)?: A Little Help from another person to put on and taking off regular upper body clothing?: A Little Help from another person to put on and taking off regular lower body clothing?: A Little 6 Click Score:  18    End of Session Equipment Utilized During Treatment: Other (comment) (Right palm guard)  OT Visit Diagnosis: Hemiplegia and hemiparesis;Other (comment) (decreased activity tolerance) Hemiplegia - Right/Left: Right Hemiplegia - dominant/non-dominant: Dominant Hemiplegia - caused by: Nontraumatic intracerebral hemorrhage   Activity Tolerance Patient tolerated treatment well   Patient Left in bed;with call bell/phone within reach;with family/visitor present;with nursing/sitter in room   Nurse Communication Mobility status;Other (comment) (Pt provided with Right palm guard; monitoring skin for signs of irritation or breakdown with  use of palm guard)        Time: 1610-9604 OT Time Calculation (min): 43 min  Charges: OT General Charges $OT Visit: 1 Visit OT Treatments $Self Care/Home Management : 23-37 mins $Orthotics Fit/Training: 8-22 mins (Right palm guard training)  Graviela Nodal "Orson Eva., OTR/L, MA Acute Rehab [phone]   Lendon Colonel 04/11/2023, 5:24 PM

## 2023-04-11 NOTE — Plan of Care (Signed)
  Problem: Health Behavior/Discharge Planning: Goal: Ability to manage health-related needs will improve Outcome: Progressing   Problem: Clinical Measurements: Goal: Will remain free from infection Outcome: Progressing Goal: Respiratory complications will improve Outcome: Progressing Goal: Cardiovascular complication will be avoided Outcome: Progressing   Problem: Activity: Goal: Risk for activity intolerance will decrease Outcome: Progressing   Problem: Nutrition: Goal: Adequate nutrition will be maintained Outcome: Progressing   Problem: Elimination: Goal: Will not experience complications related to bowel motility Outcome: Progressing Goal: Will not experience complications related to urinary retention Outcome: Progressing   Problem: Tissue Perfusion: Goal: Adequacy of tissue perfusion will improve Outcome: Progressing

## 2023-04-12 DIAGNOSIS — I10 Essential (primary) hypertension: Secondary | ICD-10-CM

## 2023-04-12 DIAGNOSIS — I6930 Unspecified sequelae of cerebral infarction: Secondary | ICD-10-CM | POA: Diagnosis not present

## 2023-04-12 DIAGNOSIS — E1169 Type 2 diabetes mellitus with other specified complication: Secondary | ICD-10-CM | POA: Diagnosis not present

## 2023-04-12 DIAGNOSIS — I4819 Other persistent atrial fibrillation: Secondary | ICD-10-CM | POA: Diagnosis not present

## 2023-04-12 DIAGNOSIS — I5033 Acute on chronic diastolic (congestive) heart failure: Secondary | ICD-10-CM | POA: Diagnosis not present

## 2023-04-12 DIAGNOSIS — N183 Chronic kidney disease, stage 3 unspecified: Secondary | ICD-10-CM | POA: Diagnosis not present

## 2023-04-12 LAB — BASIC METABOLIC PANEL
Anion gap: 9 (ref 5–15)
BUN: 19 mg/dL (ref 8–23)
CO2: 28 mmol/L (ref 22–32)
Calcium: 8.1 mg/dL — ABNORMAL LOW (ref 8.9–10.3)
Chloride: 105 mmol/L (ref 98–111)
Creatinine, Ser: 1.61 mg/dL — ABNORMAL HIGH (ref 0.61–1.24)
GFR, Estimated: 48 mL/min — ABNORMAL LOW (ref >60)
Glucose, Bld: 98 mg/dL (ref 70–99)
Potassium: 4.1 mmol/L (ref 3.5–5.1)
Sodium: 142 mmol/L (ref 135–145)

## 2023-04-12 LAB — MAGNESIUM: Magnesium: 2.1 mg/dL (ref 1.7–2.4)

## 2023-04-12 LAB — GLUCOSE, CAPILLARY
Glucose-Capillary: 83 mg/dL (ref 70–99)
Glucose-Capillary: 88 mg/dL (ref 70–99)
Glucose-Capillary: 84 mg/dL (ref 70–99)
Glucose-Capillary: 106 mg/dL — ABNORMAL HIGH (ref 70–99)
Glucose-Capillary: 101 mg/dL — ABNORMAL HIGH (ref 70–99)

## 2023-04-12 MED ORDER — KETOROLAC TROMETHAMINE 0.5 % OP SOLN
1.0000 [drp] | Freq: Four times a day (QID) | OPHTHALMIC | Status: AC
  Administered 2023-04-13 (×4): 1 [drp] via OPHTHALMIC
  Filled 2023-04-12: qty 5

## 2023-04-12 MED ORDER — SPIRONOLACTONE 25 MG PO TABS
25.0000 mg | ORAL_TABLET | Freq: Every day | ORAL | Status: DC
  Administered 2023-04-12 – 2023-04-14 (×3): 25 mg via ORAL
  Filled 2023-04-12 (×3): qty 1

## 2023-04-12 NOTE — Progress Notes (Signed)
 Physical Therapy Treatment Patient Details Name: Justin Molina. MRN: [id number] DOB: [birth year] Today's Date: 04/12/2023   History of Present Illness Pt is a 63 y.o. M presenting to ED on 3/3 with orthopnea, dyspnea, and BLE edema, admitted for acute on chronic HFpEF. Recent admission for CHF exacerbation 2/10-2/13-2025. PMH includes HFpEF, HTN, HLD, CKD IIIA, A fib, ICH with R hemiparesis, venous insufficiency, NIDT2DM, obesity.    PT Comments  Pt greeted seated in recliner chair, wife present in room, pleasant and agreeable to PT session. He demonstrated modI with transfers using hemi walker. Pt increased his gait distance, ambulating a total of ~362ft using hemi walker with CGA and cues for PLB. He became tachycardic with activity, HRmax 148bpm and reported 8/10 on the modified RPE scale. Educated pt/wife on other AD options that would aid in energy conservation and are applicable for a pt with RUE hemiplegia and RLE hemiparesis. Plan to trial standing rollator next session. Will continue to follow acutely and advance appropriately to increase activity tolerance, improved independence with functional mobility, and maximize safety to allow d/c Home with HHPT.    If plan is discharge home, recommend the following: A little help with walking and/or transfers;A little help with bathing/dressing/bathroom;Assistance with cooking/housework;Assist for transportation   Can travel by private vehicle        Equipment Recommendations  None recommended by PT    Recommendations for Other Services       Precautions / Restrictions Precautions Precautions: Fall Recall of Precautions/Restrictions: Intact Restrictions Weight Bearing Restrictions Per Provider Order: No     Mobility  Bed Mobility Overal bed mobility: Needs Assistance Bed Mobility: Sit to Supine       Sit to supine: Supervision   General bed mobility comments: Pt greeted in recliner chair. Returned to bed by sitting down,  swinging BLE into bed, and scooting to the center.    Transfers Overall transfer level: Modified independent Equipment used: Hemi-walker Transfers: Sit to/from Stand             General transfer comment: Pt stood from recliner chair x2 and completed chair>bed transfer to R. Good eccentric control with sitting.    Ambulation/Gait Ambulation/Gait assistance: Contact guard assist Gait Distance (Feet): 300 Feet Assistive device: Hemi-walker Gait Pattern/deviations: Step-to pattern, Decreased stride length, Knee hyperextension - right, Trunk flexed, Decreased weight shift to right, Decreased stance time - right Gait velocity: reduced Gait velocity interpretation: <1.8 ft/sec, indicate of risk for recurrent falls   General Gait Details: Pt maintains trunk flex fwd towards hemi walker maintained in LUE. He  ambulated with increased WBing on LHB, R foot drop compensated for by increase hip flex, and RLE hyperextension. Cued pt to PLB throughout mobility.   Stairs             Wheelchair Mobility     Tilt Bed    Modified Rankin (Stroke Patients Only)       Balance Overall balance assessment: Needs assistance Sitting-balance support: Single extremity supported, Feet supported Sitting balance-Leahy Scale: Fair Sitting balance - Comments: Pt scooted fwd in recliner chair, sat EOB and scooted bkwd with supervision.   Standing balance support: Reliant on assistive device for balance, Single extremity supported, During functional activity Standing balance-Leahy Scale: Fair Standing balance comment: Pt depends on LUE support on hemiwalker. He was stable in static stance to use the restroom. No overt LOB.  Communication Communication Communication: Impaired Factors Affecting Communication: Difficulty expressing self (Baseline for pt secondary to CVA)  Cognition Arousal: Alert Behavior During Therapy: WFL for tasks assessed/performed    PT - Cognitive impairments: No apparent impairments                         Following commands: Intact      Cueing    Exercises      General Comments General comments (skin integrity, edema, etc.): Extensive time spent discussing AD options with wife/pt. Educated pt/wife on options beyond the hemi walker and manual w/c they currently have with focus on energy conservation. Discussed safety considerations and how each device will impact pt's mobility/stability. Pt appeared to be disinterested in changing devices, but wife would like to keep looking into other options. Informed pt/wife that PT wouldn't recommend a device without educating the pt on its use and practicing the sequencing to ensure it is an appropriate AD. Pt/Wife verbalized understanding. Plan to trial standing rollator next session. Pt reported 8/10 on the modified RPE scale following ambulation. Pt tachycardic with activity, HR max 148bpm.      Pertinent Vitals/Pain Pain Assessment Pain Assessment: No/denies pain    Home Living                          Prior Function            PT Goals (current goals can now be found in the care plan section) Acute Rehab PT Goals Patient Stated Goal: Go on a walk Progress towards PT goals: Progressing toward goals    Frequency    Min 2X/week      PT Plan      Co-evaluation              AM-PAC PT "6 Clicks" Mobility   Outcome Measure  Help needed turning from your back to your side while in a flat bed without using bedrails?: A Little Help needed moving from lying on your back to sitting on the side of a flat bed without using bedrails?: A Little Help needed moving to and from a bed to a chair (including a wheelchair)?: None Help needed standing up from a chair using your arms (e.g., wheelchair or bedside chair)?: None Help needed to walk in hospital room?: A Little Help needed climbing 3-5 steps with a railing? : A Lot 6 Click Score:  19    End of Session Equipment Utilized During Treatment: Gait belt Activity Tolerance: Patient tolerated treatment well Patient left: in bed;with call bell/phone within reach;with family/visitor present Nurse Communication: Mobility status PT Visit Diagnosis: Other abnormalities of gait and mobility (R26.89);Unsteadiness on feet (R26.81) Hemiplegia - Right/Left: Right     Time: 6578-4696 PT Time Calculation (min) (ACUTE ONLY): 42 min  Charges:    $Gait Training: 23-37 mins $Therapeutic Activity: 8-22 mins PT General Charges $$ ACUTE PT VISIT: 1 Visit                     Cheri Guppy, PT, DPT Acute Rehabilitation Services Office: [phone] Secure Chat Preferred  Richardson Chiquito 04/12/2023, 4:22 PM

## 2023-04-12 NOTE — Progress Notes (Addendum)
 Patient Name: Justin Molina. Date of Encounter: 04/12/2023 [facility] Cardiologist: None   Interval Summary  .    Patient reports no focal complaints today, denies dyspnea, palpitations, chest pain.   Vital Signs .    Vitals:   04/12/23 0357 04/12/23 0600 04/12/23 0700 04/12/23 0855  BP:   (!) 158/92 (!) 142/107  Pulse: 88  89 70  Resp:    20  Temp:    98.7 F (37.1 C)  TempSrc:    Oral  SpO2: 96%   97%  Weight:  120.1 kg    Height:        Intake/Output Summary (Last 24 hours) at 04/12/2023 1028 Last data filed at 04/12/2023 0858 Gross per 24 hour  Intake 1080 ml  Output 1450 ml  Net -370 ml      04/12/2023    6:00 AM 04/10/2023    5:00 PM 04/10/2023    3:23 AM  Last 3 Weights  Weight (lbs) 264 lb 12.4 oz 273 lb 13 oz 282 lb 3 oz  Weight (kg) 120.1 kg 124.2 kg 128 kg      Telemetry/ECG    Atrial fibrillation with controlled ventricular rates, 80s-90s on average - Personally Reviewed  Physical Exam .   GEN: No acute distress.   Neck: Significant JVD to near mandible Cardiac: irregularly irregular, no murmurs, rubs, or gallops.  Respiratory: Clear to auscultation bilaterally. GI: Soft, nontender, non-distended  MS: No edema  Assessment & Plan .     Justin Molina. is a 63 y.o. male with a hx of chronic HFpEF, PFO, atrial fibrillation, hypertension, hyperlipidemia, CVA with residual right-sided hemiparesis/aphasia 2021, wheelchair-bound, CKD, type 2 diabetes who is being seen for the evaluation of CHF exacerbation at the request of Dr. Jarvis Newcomer.    Acute on chronic HFpEF Patient with EF 60-65% as of December 2024 TTE had recent admission 03/18/23-03/21/23 for acute on chronic HFpEF. He was given IV lasix, ultimately discharged home on PO lasix 40mg . Since discharge, patient with recurrent LE edema and dyspnea. Lasix increased to 80mg  but without improvement in symptoms.  Patient remains on IV lasix. Net negative 7.3L. Weights are incongruent with urine  output. Continue IV diuresis today with close monitoring of renal function. Patient still has elevation of JVP. Consider Torsemide rather than Lasix at discharge given rapid re-accumulation of volume upon most recent discharge.  Continue coreg 25mg  BID Continue Jardiance 10mg  Continue Hydralazine 50mg  TID, Imdur 120mg .  Start Spironolactone 25mg  today.    Hypertension BP elevated this admission. Improved with increased hydralazine and Amlodipine but still elevated. Continue GDMT as above.    Longstanding persistent atrial fibrillation Patient appears to have last been in sinus rhythm December 2024. Asymptomatic with rate controlled afib on admission. He was taking Multaq 400mg  BID on admission.  Multaq now held given recurrent CHF admissions. Will continue to use Coreg 25mg  BID for rate control. If he continues to have hypervolemia, would need to consider whether persistent afib is significant contributing factor. December TTE showed only mild LA dilation, so reasonable to suspect he might hold sinus rhythm.  Continue Eliquis 5mg  BID.   CKD IIIb Creatinine baseline 1.6-2.0. Continued improvement this admission, 1.97->1.70->1.61. Will continue diuresis.    Hyperlipidemia Continue home atorvastatin 80mg .    DM type II Per primary team.   BPH Continue Cardura per primary team.  For questions or updates, please contact  [facility] Please consult [email or web address] for contact info under  Signed, Perlie Gold, PA-C   Patient seen and examined.  Agree with above documentation.  On exam, patient is alert, irregular rhythm, normal rate, no murmurs, lungs CTAB,  + JVD.  Net -670 cc yesterday, -7.3 L on admission.  Renal function continues to improve with diuresis.  Will continue IV Lasix today.  Add spironolactone 25 mg daily.  Little Ishikawa, MD

## 2023-04-12 NOTE — Plan of Care (Signed)

## 2023-04-12 NOTE — Care Management Important Message (Signed)
 Important Message  Patient Details  Name: Justin Molina. MRN: [id number] Date of Birth: 1960/08/15   Important Message Given:  Yes - Medicare IM     Renie Ora 04/12/2023, 10:50 AM

## 2023-04-12 NOTE — Assessment & Plan Note (Addendum)
 Elevated diastolic blood pressure.  Difficult to control hypertension.  Currently on:  Hydralazine 100 mg tid  Isosorbide, 120 mg daily  Amlodipine 10 mg daily   Carvedilol 25 mg bid  Doxazosin 8 mg at bedtime  Spironolactone 50 mg daily

## 2023-04-12 NOTE — Progress Notes (Addendum)
 Progress Note   Patient: Justin Molina. ZOX:[id number] DOB: [birth year] DOA: 04/08/2023     3 DOS: the patient was seen and examined on 04/12/2023   Brief hospital course: Mr. Pina was admitted to the hospital with the working diagnosis of heart failure exacerbation.   63 y.o. 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  Recent hospitalization for CHF exacerbation 2/10-2/13, discharged on lasix 40mg  daily which was increased by his PCP about a week prior to admission to 80 mg daily with no significant improvement in his symptoms.  On his initial physical examination his blood pressure was 160/104, HR 53, RR 26 and 92 saturation 98%, lungs with no wheezing or rhonchi, heart with S1 and S2 present and regular with no gallops, abdomen with no distention and positive lower extremity edema.   Na 143, K 3.8 Cl 109, bicarbonate 26, glucose 83 bun 19 cr 1,88  BNP 746  Wbc 7,6 hgb 11.2 plt 212  Urine analysis SG 1,015, protein 30, glucose >500, small hgb and  negative leukocytes.   CT head with no acute intracranial abnormalities. Unchanged gliosis and encephalomalacia through the left hemisphere.   Chest radiograph with hypoinflation, positive cardiomegaly, bilateral hilar vascular congestion with cephalization of the vasculature, positive fluid in the right fissure, with small right pleural effusion.   EKG 95 bpm, left axis deviation, qtc 540, atrial fibrillation rhythm with no significant ST segment or  T wave changes.   Patient has been placed on IV furosemide for diuresis.   Assessment and Plan: * Acute on chronic diastolic CHF (congestive heart failure) (HCC) Echocardiogram with preserved LV systolic function with EF 60 to 65%, mild LVH, RV systolic function preserved, no  significant valvular disease. Trivial pericardial effusion. Mild LA dilatation.   Urine output is 1,450 ml Systolic blood pressure 150 mmHg range.  Plan to continue diuresis with furosemide 60 mg IV daily.  Carvedilol, hydralazine and isosorbide.  Added spironolactone and SGLT 2 inh.  Limited pharmacologic options due to acutely reduced GFR  Chronic atrial fibrillation with RVR (HCC) Continue rate control with carvedilol and anticoagulation with apixaban.  Continue telemetry monitoring.   CKD (chronic kidney disease) stage 3, GFR 30-59 ml/min (HCC) CKD stage 3a. Hypokalemia and hypernatremia.   Renal function today with serum cr at 1,6 with K at 4.1 and serum bicarbonate at 28  Na 142 Mg 2.1   Continue diuresis with furosemide 60 mg IV daily. Follow up renal function and electrolytes.  Added spironolactone and SGLT 2 inh.   Anemia of chronic renal disease, stable.   Type 2 diabetes mellitus with hyperlipidemia (HCC) Continue glucose cover and monitoring with insulin sliding scale.  Fasting gluocse this morning 98 mg/dl.   Continue with statin therapy  History of CVA with residual deficit Continue blood pressure control, statin and anticoagulation with apixaban.   Open wound of right lower extremity Continue local wound care.   Essential hypertension Continue blood pressure control with, hydralazine, isosorbide, amlodipine and carvedilol.   Aggressive diuresis with furosemide.  Continue blood pressure monitoring.         Subjective: Patient with improvement in dyspnea and edema, no chest pain, no PND or orthopnea,   Physical Exam: Vitals:   04/12/23 0700 04/12/23 0855 04/12/23 1118 04/12/23 1217  BP: (!) 158/92 (!) 142/107 (!) 139/99  Pulse: 89 70 90   Resp:  20 19   Temp:  98.7 F (37.1 C) 97.8 F (36.6 C) 99.1 F (37.3 C)  TempSrc:  Oral Oral Oral  SpO2:  97% 98%   Weight:      Height:       Neurology awake and alert, chronic right sided weakness.   ENT no pallor or icterus Cardiovascular with S1 and S2 present, irregularly irregular with no gallops, rubs or murmurs No JVD Positive lower extremity edema + more right than left  Respiratory with mild rales at bases with no wheezing or rhonchi Abdomen with no distention  Data Reviewed:    Family Communication: I spoke with patient's wife at the bedside, we talked in detail about patient's condition, plan of care and prognosis and all questions were addressed.   Disposition: Status is: Inpatient Remains inpatient appropriate because: IV diuresis   Planned Discharge Destination: Home      Author: Coralie Keens, MD 04/12/2023 2:25 PM  For on call review [email or web address].

## 2023-04-13 DIAGNOSIS — E1169 Type 2 diabetes mellitus with other specified complication: Secondary | ICD-10-CM | POA: Diagnosis not present

## 2023-04-13 DIAGNOSIS — I10 Essential (primary) hypertension: Secondary | ICD-10-CM

## 2023-04-13 DIAGNOSIS — I6930 Unspecified sequelae of cerebral infarction: Secondary | ICD-10-CM | POA: Diagnosis not present

## 2023-04-13 DIAGNOSIS — N183 Chronic kidney disease, stage 3 unspecified: Secondary | ICD-10-CM | POA: Diagnosis not present

## 2023-04-13 DIAGNOSIS — I5033 Acute on chronic diastolic (congestive) heart failure: Secondary | ICD-10-CM | POA: Diagnosis not present

## 2023-04-13 LAB — BASIC METABOLIC PANEL
Anion gap: 6 (ref 5–15)
BUN: 19 mg/dL (ref 8–23)
CO2: 27 mmol/L (ref 22–32)
Calcium: 8.1 mg/dL — ABNORMAL LOW (ref 8.9–10.3)
Chloride: 109 mmol/L (ref 98–111)
Creatinine, Ser: 1.65 mg/dL — ABNORMAL HIGH (ref 0.61–1.24)
GFR, Estimated: 47 mL/min — ABNORMAL LOW (ref >60)
Glucose, Bld: 92 mg/dL (ref 70–99)
Potassium: 3.3 mmol/L — ABNORMAL LOW (ref 3.5–5.1)
Sodium: 142 mmol/L (ref 135–145)

## 2023-04-13 LAB — GLUCOSE, CAPILLARY
Glucose-Capillary: 83 mg/dL (ref 70–99)
Glucose-Capillary: 74 mg/dL (ref 70–99)
Glucose-Capillary: 87 mg/dL (ref 70–99)
Glucose-Capillary: 94 mg/dL (ref 70–99)

## 2023-04-13 LAB — MAGNESIUM: Magnesium: 2.1 mg/dL (ref 1.7–2.4)

## 2023-04-13 MED ORDER — POTASSIUM CHLORIDE 20 MEQ PO PACK
40.0000 meq | PACK | Freq: Once | ORAL | Status: AC
  Administered 2023-04-13: 40 meq via ORAL
  Filled 2023-04-13: qty 2

## 2023-04-13 MED ORDER — HYDRALAZINE HCL 50 MG PO TABS
100.0000 mg | ORAL_TABLET | Freq: Three times a day (TID) | ORAL | Status: DC
  Administered 2023-04-13 – 2023-04-16 (×10): 100 mg via ORAL
  Filled 2023-04-13 (×10): qty 2

## 2023-04-13 MED ORDER — POTASSIUM CHLORIDE CRYS ER 20 MEQ PO TBCR
40.0000 meq | EXTENDED_RELEASE_TABLET | Freq: Once | ORAL | Status: AC
  Administered 2023-04-13: 40 meq via ORAL
  Filled 2023-04-13: qty 2

## 2023-04-13 MED ORDER — POLYVINYL ALCOHOL 1.4 % OP SOLN
1.0000 [drp] | OPHTHALMIC | Status: DC | PRN
  Administered 2023-04-13: 1 [drp] via OPHTHALMIC
  Filled 2023-04-13: qty 15

## 2023-04-13 NOTE — Progress Notes (Signed)
 Physical Therapy Treatment Patient Details Name: Justin Molina. MRN: [id number] DOB: 04-30-1960 Today's Date: 04/13/2023   History of Present Illness Pt is a 63 y.o. M presenting to ED on 3/3 with orthopnea, dyspnea, and BLE edema, admitted for acute on chronic HFpEF. Recent admission for CHF exacerbation 2/10-2/13-2025. PMH includes HFpEF, HTN, HLD, CKD IIIA, A fib, ICH with R hemiparesis, venous insufficiency, NIDT2DM, obesity.    PT Comments  Pt resting in bed on arrival and agreeable to session. Session focused on upright rollator trial for improved energy conservation and upright posture. Pt able to perform 2 gait bouts with upright rollator and hemi-walker with grossly CGA for each trial. Pt with noted decreased stability and gait speed with upright rollator with pt needing x5 standing rest breaks and noted postural sway throughout but no overt LOB noted. Pt HR in 110s with rollator use. Pt endorsing increased confidence with hemi-walker, requiring grossly CGA for gait and cues for breathing techniques. Pt continues to benefit from skilled PT services to progress toward functional mobility goals.     If plan is discharge home, recommend the following: A little help with walking and/or transfers;A little help with bathing/dressing/bathroom;Assistance with cooking/housework;Assist for transportation   Can travel by private vehicle        Equipment Recommendations  None recommended by PT    Recommendations for Other Services       Precautions / Restrictions Precautions Precautions: Fall Recall of Precautions/Restrictions: Intact Precaution/Restrictions Comments: R hemi Restrictions Weight Bearing Restrictions Per Provider Order: No     Mobility  Bed Mobility Overal bed mobility: Needs Assistance Bed Mobility: Sit to Supine, Supine to Sit     Supine to sit: Used rails, Supervision     General bed mobility comments: supervision for safety with + bedrial use     Transfers Overall transfer level: Modified independent Equipment used: Hemi-walker, None Transfers: Sit to/from Stand Sit to Stand: Contact guard assist, From elevated surface, Min assist           General transfer comment: able to rise with CGA from slightly elevated EOB, min A to boost to stand from lower eate on upright rollator    Ambulation/Gait Ambulation/Gait assistance: Contact guard assist Gait Distance (Feet): 80 Feet (x2) Assistive device: Hemi-walker (upright rollator) Gait Pattern/deviations: Step-to pattern, Decreased stride length, Knee hyperextension - right, Trunk flexed, Decreased weight shift to right, Decreased stance time - right Gait velocity: reduced     General Gait Details: trial of upright rollator with increased lateral sway due to instability of wheels on rollator, pt very slow with rollator vs hemi-walker, did endorse some benefit re; energy exertion with 5/10 rating on modified RPE scale, much quicker cadence and confidence with hemi-walker   Stairs             Wheelchair Mobility     Tilt Bed    Modified Rankin (Stroke Patients Only)       Balance Overall balance assessment: Needs assistance Sitting-balance support: Single extremity supported, Feet supported Sitting balance-Leahy Scale: Fair Sitting balance - Comments: Pt scooted fwd in recliner chair, sat EOB and scooted bkwd with supervision.   Standing balance support: Reliant on assistive device for balance, Single extremity supported, During functional activity Standing balance-Leahy Scale: Fair Standing balance comment: Pt depends on LUE support on hemiwalker. He was stable in static stance  Communication Communication Communication: Impaired Factors Affecting Communication: Difficulty expressing self (Baseline for pt secondary to CVA)  Cognition Arousal: Alert Behavior During Therapy: WFL for tasks assessed/performed   PT -  Cognitive impairments: No apparent impairments                         Following commands: Intact      Cueing Cueing Techniques: Verbal cues, Tactile cues  Exercises      General Comments        Pertinent Vitals/Pain Pain Assessment Pain Assessment: No/denies pain    Home Living                          Prior Function            PT Goals (current goals can now be found in the care plan section) Acute Rehab PT Goals PT Goal Formulation: With patient Time For Goal Achievement: 04/24/23 Progress towards PT goals: Progressing toward goals    Frequency    Min 2X/week      PT Plan      Co-evaluation              AM-PAC PT "6 Clicks" Mobility   Outcome Measure  Help needed turning from your back to your side while in a flat bed without using bedrails?: A Little Help needed moving from lying on your back to sitting on the side of a flat bed without using bedrails?: A Little Help needed moving to and from a bed to a chair (including a wheelchair)?: None Help needed standing up from a chair using your arms (e.g., wheelchair or bedside chair)?: None Help needed to walk in hospital room?: A Little Help needed climbing 3-5 steps with a railing? : A Lot 6 Click Score: 19    End of Session Equipment Utilized During Treatment: Gait belt Activity Tolerance: Patient tolerated treatment well Patient left: in bed;with call bell/phone within reach;with family/visitor present (seated up EOB with MD present) Nurse Communication: Mobility status PT Visit Diagnosis: Other abnormalities of gait and mobility (R26.89);Unsteadiness on feet (R26.81) Hemiplegia - Right/Left: Right     Time: 4696-2952 PT Time Calculation (min) (ACUTE ONLY): 30 min  Charges:    $Gait Training: 23-37 mins PT General Charges $$ ACUTE PT VISIT: 1 Visit                     Raffaele Derise R. PTA Acute Rehabilitation Services Office: [phone]   Catalina Antigua 04/13/2023, 1:51  PM

## 2023-04-13 NOTE — Progress Notes (Signed)
 Rounding Note    Patient Name: Justin Molina. Date of Encounter: 04/13/2023  [facility] Cardiologist: Bjorn Pippin    Subjective   63 year old gentleman with a history of chronic HFpEF, PFO, atrial fibrillation, hypertension, hyperlipidemia, CVA who presented on March 4 with CHF exacerbation.  He has a long history of atrial fibrillation and has been maintained on Multaq and Eliquis.  There are no history of cardioversions.   He is diuresed 8.8 L so far during this hospitalization. Blood pressure remains mildly elevated.  Will increase hydralazine to 100 mg TID     Inpatient Medications    Scheduled Meds:  amLODipine  10 mg Oral Daily   apixaban  5 mg Oral BID   atorvastatin  80 mg Oral QHS   carvedilol  25 mg Oral BID WC   doxazosin  8 mg Oral QHS   empagliflozin  10 mg Oral Daily   furosemide  60 mg Intravenous Daily   hydrALAZINE  50 mg Oral TID   insulin aspart  0-9 Units Subcutaneous TID WC   isosorbide mononitrate  120 mg Oral Daily   ketorolac  1 drop Both Eyes QID   polyethylene glycol  17 g Oral Daily   senna  1 tablet Oral QHS   spironolactone  25 mg Oral Daily   Continuous Infusions:  PRN Meds: acetaminophen **OR** acetaminophen, albuterol   Vital Signs    Vitals:   04/13/23 0049 04/13/23 0050 04/13/23 0420 04/13/23 0729  BP:   (!) 149/108 (!) 141/109  Pulse: 89 95 74 67  Resp:   18 19  Temp:   98.2 F (36.8 C) 98.5 F (36.9 C)  TempSrc:   Oral Oral  SpO2: 98% 96% 97% 96%  Weight:   122.5 kg   Height:        Intake/Output Summary (Last 24 hours) at 04/13/2023 1005 Last data filed at 04/13/2023 0945 Gross per 24 hour  Intake 640 ml  Output 2595 ml  Net -1955 ml      04/13/2023    4:20 AM 04/12/2023    6:00 AM 04/10/2023    5:00 PM  Last 3 Weights  Weight (lbs) 270 lb 264 lb 12.4 oz 273 lb 13 oz  Weight (kg) 122.471 kg 120.1 kg 124.2 kg      Telemetry    Atrial fib  - Personally Reviewed  ECG     - Personally  Reviewed  Physical Exam   GEN: obese, middle age man,  NAD     Neck: No JVD Cardiac: RRR, no murmurs, rubs, or gallops.  Respiratory: Clear to auscultation bilaterally. GI: Soft, nontender, non-distended  MS: No edema; No deformity. Neuro:  Nonfocal  Psych: Normal affect   Labs    High Sensitivity Troponin:   Recent Labs  Lab 04/09/23 0850 04/09/23 1123  TROPONINIHS 23* 23*     Chemistry Recent Labs  Lab 04/08/23 1542 04/09/23 0429 04/10/23 0306 04/11/23 0313 04/12/23 0259 04/13/23 0245  NA 143 143   < > 141 142 142  K 3.8 3.1*   < > 3.2* 4.1 3.3*  CL 109 111   < > 108 105 109  CO2 26 23   < > 29 28 27   GLUCOSE 83 85   < > 78 98 92  BUN 19 16   < > 16 19 19   CREATININE 1.88* 1.81*   < > 1.70* 1.61* 1.65*  CALCIUM 8.2* 7.8*   < > 7.7* 8.1* 8.1*  MG  --  2.1   < > 2.1 2.1 2.1  PROT 8.0 6.9  --   --   --   --   ALBUMIN 3.6 3.0*  --   --   --   --   AST 24 14*  --   --   --   --   ALT 14 11  --   --   --   --   ALKPHOS 73 59  --   --   --   --   BILITOT 1.4* 0.8  --   --   --   --   GFRNONAA 40* 42*   < > 45* 48* 47*  ANIONGAP 8 9   < > 4* 9 6   < > = values in this interval not displayed.    Lipids No results for input(s): "CHOL", "TRIG", "HDL", "LABVLDL", "LDLCALC", "CHOLHDL" in the last 168 hours.  Hematology Recent Labs  Lab 04/08/23 1542 04/09/23 0429  WBC 7.6 4.9  RBC 4.38 4.25  HGB 11.2* 10.8*  HCT 35.9* 34.5*  MCV 82.0 81.2  MCH 25.6* 25.4*  MCHC 31.2 31.3  RDW 17.2* 17.3*  PLT 212 196   Thyroid  Recent Labs  Lab 04/09/23 0429  TSH 0.831    BNP Recent Labs  Lab 04/08/23 1542  BNP 746.8*    DDimer No results for input(s): "DDIMER" in the last 168 hours.   Radiology    No results found.  Cardiac Studies      Patient Profile     63 y.o. male   Assessment & Plan    Acute on chronic HFpEF:  continue diuresis.  2.  Hypertension: I have increased hydralazine to 100 mg 3 times a day.  Continue diuresis .   Will continue to  follow.  3.  Persistent atrial fibrillation: Continue Eliquis.  4.  Chronic kidney disease  5.  Hyperlipidemia: Continue atorvastatin.           For questions or updates, please contact [facility] Please consult [email or web address] for contact info under        Signed, Kristeen Miss, MD  04/13/2023, 10:05 AM

## 2023-04-13 NOTE — Plan of Care (Signed)
  Problem: Health Behavior/Discharge Planning: Goal: Ability to manage health-related needs will improve Outcome: Progressing   Problem: Clinical Measurements: Goal: Will remain free from infection Outcome: Progressing Goal: Respiratory complications will improve Outcome: Progressing Goal: Cardiovascular complication will be avoided Outcome: Progressing   Problem: Cardiac: Goal: Ability to achieve and maintain adequate cardiopulmonary perfusion will improve Outcome: Progressing   Problem: Tissue Perfusion: Goal: Adequacy of tissue perfusion will improve Outcome: Progressing

## 2023-04-13 NOTE — Progress Notes (Signed)
 Progress Note   Patient: Justin Molina. UJW:[id number] DOB: 07/13/1960 DOA: 04/08/2023     4 DOS: the patient was seen and examined on 04/13/2023   Brief hospital course: Mr. Beckford was admitted to the hospital with the working diagnosis of heart failure exacerbation.   63 y.o. 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  Recent hospitalization for CHF exacerbation 2/10-2/13, discharged on lasix 40mg  daily which was increased by his PCP about a week prior to admission to 80 mg daily with no significant improvement in his symptoms.  On his initial physical examination his blood pressure was 160/104, HR 53, RR 26 and 92 saturation 98%, lungs with no wheezing or rhonchi, heart with S1 and S2 present and regular with no gallops, abdomen with no distention and positive lower extremity edema.   Na 143, K 3.8 Cl 109, bicarbonate 26, glucose 83 bun 19 cr 1,88  BNP 746  Wbc 7,6 hgb 11.2 plt 212  Urine analysis SG 1,015, protein 30, glucose >500, small hgb and  negative leukocytes.   CT head with no acute intracranial abnormalities. Unchanged gliosis and encephalomalacia through the left hemisphere.   Chest radiograph with hypoinflation, positive cardiomegaly, bilateral hilar vascular congestion with cephalization of the vasculature, positive fluid in the right fissure, with small right pleural effusion.   EKG 95 bpm, left axis deviation, qtc 540, atrial fibrillation rhythm with no significant ST segment or  T wave changes.   Patient has been placed on IV furosemide for diuresis.   03/08 responding well to diuresis.   Assessment and Plan: * Acute on chronic diastolic CHF (congestive heart failure) (HCC) Echocardiogram with preserved LV systolic function with EF 60 to 65%, mild LVH,  RV systolic function preserved, no significant valvular disease. Trivial pericardial effusion. Mild LA dilatation.   Urine output is 2,225 ml Systolic blood pressure 140 mmHg range.  Plan to continue diuresis with furosemide 60 mg IV daily.  Carvedilol, hydralazine and isosorbide.  Spironolactone and SGLT 2 inh.  Limited pharmacologic options due to acutely reduced GFR  Chronic atrial fibrillation with RVR (HCC) Continue rate control with carvedilol and anticoagulation with apixaban.  Continue telemetry monitoring.   CKD (chronic kidney disease) stage 3, GFR 30-59 ml/min (HCC) CKD stage 3a. Hypokalemia and hypernatremia.   Today renal function with serum cr at 1,65 with K at 3,3 and serum bicarbonate at 27  Na 142 and Mg 2.1   Add 40 meq Kcl x 2 doses and follow up renal function in am. Continue diuresis with furosemide, spironolactone and SGLT 2 inh.   Anemia of chronic renal disease, stable.   Type 2 diabetes mellitus with hyperlipidemia (HCC) Continue glucose cover and monitoring with insulin sliding scale.  Fasting gluocse this morning 92 mg/dl.   Continue with statin therapy  History of CVA with residual deficit Continue blood pressure control, statin and anticoagulation with apixaban.  Left sided weakness.   Open wound of right lower extremity Continue local wound care.   Essential hypertension Continue blood pressure control with, hydralazine, isosorbide, amlodipine and carvedilol.   Aggressive diuresis with furosemide.  Continue blood pressure monitoring.         Subjective: Patient with no chest pain or dyspnea, edema continue to improve, he has chronic left sided weakness due to old CVA   Physical Exam: Vitals:  04/13/23 0049 04/13/23 0050 04/13/23 0420 04/13/23 0729  BP:   (!) 149/108 (!) 141/109  Pulse: 89 95 74 67  Resp:   18 19  Temp:   98.2 F (36.8 C) 98.5 F (36.9 C)  TempSrc:   Oral Oral  SpO2: 98% 96% 97% 96%  Weight:   122.5 kg    Height:       Neurology awake and alert ENT with mild pallor Cardiovascular with S1 and S2 present, irregularly irregular with no gallops, rubs or murmurs Respiratory with mild rales at bases with poor inspiratory effort, no wheezing or rhonchi Abdomen with no distention  Trace lower extremity edema  Data Reviewed:    Family Communication: I spoke with patient's wife at the bedside, we talked in detail about patient's condition, plan of care and prognosis and all questions were addressed.   Disposition: Status is: Inpatient Remains inpatient appropriate because: IV diuresis   Planned Discharge Destination: Home      Author: Coralie Keens, MD 04/13/2023 11:47 AM  For on call review [email or web address].

## 2023-04-13 NOTE — Progress Notes (Addendum)
 Received message from PT that pt and wife are interested on an upright rollator. Contacted Ada with [employer] [facility] and Jermaine with [employer]. They reported that they don't have upright rollators. Jermaine reports that the this DME is private pay and he may be able to buy one at [employer]. Wife reports that they live on a fixed income. She asked if I could search the price for the DME. Informed them that the cheapest one I found was at [facility] for $140.00. She reports that its not too expensive. Encouraged wife to call [employer] or the [employer] to inquire about the DME. She verbalized understanding.

## 2023-04-14 DIAGNOSIS — I10 Essential (primary) hypertension: Secondary | ICD-10-CM | POA: Diagnosis not present

## 2023-04-14 DIAGNOSIS — I4811 Longstanding persistent atrial fibrillation: Secondary | ICD-10-CM | POA: Diagnosis not present

## 2023-04-14 DIAGNOSIS — N183 Chronic kidney disease, stage 3 unspecified: Secondary | ICD-10-CM | POA: Diagnosis not present

## 2023-04-14 DIAGNOSIS — I6930 Unspecified sequelae of cerebral infarction: Secondary | ICD-10-CM | POA: Diagnosis not present

## 2023-04-14 DIAGNOSIS — I5033 Acute on chronic diastolic (congestive) heart failure: Secondary | ICD-10-CM | POA: Diagnosis not present

## 2023-04-14 LAB — BASIC METABOLIC PANEL
Anion gap: 8 (ref 5–15)
BUN: 19 mg/dL (ref 8–23)
CO2: 26 mmol/L (ref 22–32)
Calcium: 8.6 mg/dL — ABNORMAL LOW (ref 8.9–10.3)
Chloride: 109 mmol/L (ref 98–111)
Creatinine, Ser: 1.56 mg/dL — ABNORMAL HIGH (ref 0.61–1.24)
GFR, Estimated: 50 mL/min — ABNORMAL LOW (ref >60)
Glucose, Bld: 96 mg/dL (ref 70–99)
Potassium: 3.5 mmol/L (ref 3.5–5.1)
Sodium: 143 mmol/L (ref 135–145)

## 2023-04-14 LAB — GLUCOSE, CAPILLARY
Glucose-Capillary: 87 mg/dL (ref 70–99)
Glucose-Capillary: 92 mg/dL (ref 70–99)
Glucose-Capillary: 90 mg/dL (ref 70–99)
Glucose-Capillary: 76 mg/dL (ref 70–99)

## 2023-04-14 MED ORDER — SPIRONOLACTONE 25 MG PO TABS
25.0000 mg | ORAL_TABLET | Freq: Once | ORAL | Status: DC
  Filled 2023-04-14: qty 1

## 2023-04-14 MED ORDER — SPIRONOLACTONE 25 MG PO TABS
50.0000 mg | ORAL_TABLET | Freq: Every day | ORAL | Status: DC
  Administered 2023-04-15 – 2023-04-16 (×2): 50 mg via ORAL
  Filled 2023-04-14 (×2): qty 2

## 2023-04-14 NOTE — Plan of Care (Signed)
  Problem: Health Behavior/Discharge Planning: Goal: Ability to manage health-related needs will improve Outcome: Progressing   Problem: Clinical Measurements: Goal: Will remain free from infection Outcome: Progressing Goal: Respiratory complications will improve Outcome: Progressing Goal: Cardiovascular complication will be avoided Outcome: Progressing   Problem: Activity: Goal: Risk for activity intolerance will decrease Outcome: Progressing   Problem: Elimination: Goal: Will not experience complications related to bowel motility Outcome: Progressing   Problem: Safety: Goal: Ability to remain free from injury will improve Outcome: Progressing   Problem: Cardiac: Goal: Ability to achieve and maintain adequate cardiopulmonary perfusion will improve Outcome: Progressing   Problem: Tissue Perfusion: Goal: Adequacy of tissue perfusion will improve Outcome: Progressing

## 2023-04-14 NOTE — Progress Notes (Signed)
 Rounding Note    Patient Name: Justin Molina. Date of Encounter: 04/14/2023  [facility] Cardiologist: Bjorn Pippin    Subjective   63 year old gentleman with a history of chronic HFpEF, PFO, atrial fibrillation, hypertension, hyperlipidemia, CVA who presented on March 4 with CHF exacerbation.  He has a long history of atrial fibrillation and has been maintained on Multaq and Eliquis.  There are no history of cardioversions. Multaq has been stopped     He has a net diuresis of 10.5 L so far during this admission.  I have increased  hydralazine to 100 mg TID  Blood pressure remains mildly elevated-154/97 this morning. Will increase his spironolactone to 50 mg a day  Will give an additional spironolactone 25 mg today     Inpatient Medications    Scheduled Meds:  amLODipine  10 mg Oral Daily   apixaban  5 mg Oral BID   atorvastatin  80 mg Oral QHS   carvedilol  25 mg Oral BID WC   doxazosin  8 mg Oral QHS   empagliflozin  10 mg Oral Daily   furosemide  60 mg Intravenous Daily   hydrALAZINE  100 mg Oral TID   insulin aspart  0-9 Units Subcutaneous TID WC   isosorbide mononitrate  120 mg Oral Daily   polyethylene glycol  17 g Oral Daily   senna  1 tablet Oral QHS   spironolactone  25 mg Oral Daily   Continuous Infusions:  PRN Meds: acetaminophen **OR** acetaminophen, albuterol, polyvinyl alcohol   Vital Signs    Vitals:   04/14/23 0003 04/14/23 0415 04/14/23 0627 04/14/23 0740  BP: (!) 136/99 (!) 142/90  (!) 154/97  Pulse: 87 88  76  Resp: 18 20  18   Temp: 98.2 F (36.8 C) 97.8 F (36.6 C)  97.9 F (36.6 C)  TempSrc: Oral Oral  Oral  SpO2: 95% 98%  95%  Weight:  121.7 kg 121.7 kg   Height:  5\' 11"  (1.803 m)      Intake/Output Summary (Last 24 hours) at 04/14/2023 1109 Last data filed at 04/14/2023 1610 Gross per 24 hour  Intake 330 ml  Output 2125 ml  Net -1795 ml      04/14/2023    6:27 AM 04/14/2023    4:15 AM 04/13/2023    4:20 AM  Last  3 Weights  Weight (lbs) 268 lb 4.8 oz 268 lb 4.8 oz 270 lb  Weight (kg) 121.7 kg 121.7 kg 122.471 kg      Telemetry    Atrial fib  - Personally Reviewed  ECG     - Personally Reviewed  Physical Exam   GEN: obese, middle age man,  NAD     Neck: difficult to assess his neck veins  Cardiac: irreg. Irreg no murmurs, rubs, or gallops.  Respiratory: rew rales posteriorly  GI: Soft, nontender, non-distended  MS: less edema  Neuro:  Nonfocal  Psych: Normal affect   Labs    High Sensitivity Troponin:   Recent Labs  Lab 04/09/23 0850 04/09/23 1123  TROPONINIHS 23* 23*     Chemistry Recent Labs  Lab 04/08/23 1542 04/09/23 0429 04/10/23 0306 04/11/23 0313 04/12/23 0259 04/13/23 0245 04/14/23 0357  NA 143 143   < > 141 142 142 143  K 3.8 3.1*   < > 3.2* 4.1 3.3* 3.5  CL 109 111   < > 108 105 109 109  CO2 26 23   < > 29 28 27  26  GLUCOSE 83 85   < > 78 98 92 96  BUN 19 16   < > 16 19 19 19   CREATININE 1.88* 1.81*   < > 1.70* 1.61* 1.65* 1.56*  CALCIUM 8.2* 7.8*   < > 7.7* 8.1* 8.1* 8.6*  MG  --  2.1   < > 2.1 2.1 2.1  --   PROT 8.0 6.9  --   --   --   --   --   ALBUMIN 3.6 3.0*  --   --   --   --   --   AST 24 14*  --   --   --   --   --   ALT 14 11  --   --   --   --   --   ALKPHOS 73 59  --   --   --   --   --   BILITOT 1.4* 0.8  --   --   --   --   --   GFRNONAA 40* 42*   < > 45* 48* 47* 50*  ANIONGAP 8 9   < > 4* 9 6 8    < > = values in this interval not displayed.    Lipids No results for input(s): "CHOL", "TRIG", "HDL", "LABVLDL", "LDLCALC", "CHOLHDL" in the last 168 hours.  Hematology Recent Labs  Lab 04/08/23 1542 04/09/23 0429  WBC 7.6 4.9  RBC 4.38 4.25  HGB 11.2* 10.8*  HCT 35.9* 34.5*  MCV 82.0 81.2  MCH 25.6* 25.4*  MCHC 31.2 31.3  RDW 17.2* 17.3*  PLT 212 196   Thyroid  Recent Labs  Lab 04/09/23 0429  TSH 0.831    BNP Recent Labs  Lab 04/08/23 1542  BNP 746.8*    DDimer No results for input(s): "DDIMER" in the last 168 hours.    Radiology    No results found.  Cardiac Studies      Patient Profile     63 y.o. male   Assessment & Plan    Acute on chronic HFpEF:   Continue diuresis.  I increased his spironolactone to 50 mg a day.  Continue Lasix and potassium supplement.  Will continue to monitor his electrolytes daily.  2.  Hypertension: I have increased hydralazine to 100 mg 3 times a day.  I increased his spironolactone to 50 mg .    3.  Persistent atrial fibrillation: continue eliquis   4.  Chronic kidney disease  5.  Hyperlipidemia: Continue atorvastatin.           For questions or updates, please contact [facility] Please consult [email or web address] for contact info under        Signed, Kristeen Miss, MD  04/14/2023, 11:09 AM

## 2023-04-14 NOTE — Plan of Care (Signed)
  Problem: Education: Goal: Knowledge of General Education information will improve Description: Including pain rating scale, medication(s)/side effects and non-pharmacologic comfort measures Outcome: Progressing   Problem: Skin Integrity: Goal: Risk for impaired skin integrity will decrease Outcome: Progressing   Problem: Education: Goal: Ability to demonstrate management of disease process will improve Outcome: Progressing   Problem: Cardiac: Goal: Ability to achieve and maintain adequate cardiopulmonary perfusion will improve Outcome: Progressing

## 2023-04-14 NOTE — Progress Notes (Signed)
 Progress Note   Patient: Justin Molina. [id number]:[id number] DOB: 03-Dec-1960 DOA: 04/08/2023     5 DOS: the patient was seen and examined on 04/14/2023   Brief hospital course: Justin Molina was admitted to the hospital with the working diagnosis of heart failure exacerbation.   63 y.o. 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  Recent hospitalization for CHF exacerbation 2/10-2/13, discharged on lasix 40mg  daily which was increased by his PCP about a week prior to admission to 80 mg daily with no significant improvement in his symptoms.  On his initial physical examination his blood pressure was 160/104, HR 53, RR 26 and 92 saturation 98%, lungs with no wheezing or rhonchi, heart with S1 and S2 present and regular with no gallops, abdomen with no distention and positive lower extremity edema.   Na 143, K 3.8 Cl 109, bicarbonate 26, glucose 83 bun 19 cr 1,88  BNP 746  Wbc 7,6 hgb 11.2 plt 212  Urine analysis SG 1,015, protein 30, glucose >500, small hgb and  negative leukocytes.   CT head with no acute intracranial abnormalities. Unchanged gliosis and encephalomalacia through the left hemisphere.   Chest radiograph with hypoinflation, positive cardiomegaly, bilateral hilar vascular congestion with cephalization of the vasculature, positive fluid in the right fissure, with small right pleural effusion.   EKG 95 bpm, left axis deviation, qtc 540, atrial fibrillation rhythm with no significant ST segment or  T wave changes.   Patient has been placed on IV furosemide for diuresis.   03/08 responding well to diuresis.  03/09 continue to adjust his medical regimen.   Assessment and Plan: * Acute on chronic diastolic CHF (congestive heart failure) (HCC) Echocardiogram with preserved LV  systolic function with EF 60 to 65%, mild LVH, RV systolic function preserved, no significant valvular disease. Trivial pericardial effusion. Mild LA dilatation.   Urine output is 2,495 ml Systolic blood pressure 140 mmHg range.  Diuresis with furosemide 60 mg IV daily.  Carvedilol, hydralazine and isosorbide (dose increased to 100 mg tid).  Spironolactone and SGLT 2 inh.  To consider transition to oral loop diuretic.  Limited pharmacologic options due to acutely reduced GFR  Chronic atrial fibrillation with RVR (HCC) Continue rate control with carvedilol and anticoagulation with apixaban.  Continue telemetry monitoring.   CKD (chronic kidney disease) stage 3, GFR 30-59 ml/min (HCC) AKI on CKD stage 3a. Hypokalemia and hypernatremia.   Continue to improved renal function with serum cr down to 1,56 with K at 3,5 and serum bicarbonate at 26  Na 143   Continue diuresis with furosemide, spironolactone (dose increased to 50 mg)  and SGLT 2 inh.   Anemia of chronic renal disease, stable.   Essential hypertension Continue blood pressure control with, hydralazine, isosorbide, amlodipine and carvedilol.   Increased dose of hydralazine to 100 mg po tid, isosorbide dose is 120 mg   Type 2 diabetes mellitus with hyperlipidemia (HCC) Continue glucose cover and monitoring with insulin sliding scale.  Fasting gluocse this morning 96 mg/dl.   Continue with statin therapy  History of CVA with residual deficit Continue blood pressure control, statin and anticoagulation with apixaban.  Left sided weakness.   Open wound of right lower extremity Continue local wound care.         Subjective: Patient is feeling better with improvement in dyspnea  and lower extremity edema, no chest pain, no nausea or vomiting, he has chronic left sided weakness and limited mobility.   Physical Exam: Vitals:   04/14/23 0415 04/14/23 0627 04/14/23 0740 04/14/23 1113  BP: (!) 142/90  (!) 154/97 (!) 150/97   Pulse: 88  76 94  Resp: 20  18 18   Temp: 97.8 F (36.6 C)  97.9 F (36.6 C) (!) 97.5 F (36.4 C)  TempSrc: Oral  Oral Oral  SpO2: 98%  95% 99%  Weight: 121.7 kg 121.7 kg    Height: 5\' 11"  (1.803 m)      Neurology awake and alert. Chronic left sided weakness ENT With mild pallor Cardiovascular with S1 and S2 present, irregularly irregular,  with no gallops, rubs or murmurs No JVD Trace lower extremity edema Respiratory with no rales or wheezing, no rhonchi Abdomen with no distention  Data Reviewed:    Family Communication: I spoke with patient's wife at the bedside, we talked in detail about patient's condition, plan of care and prognosis and all questions were addressed.   Disposition: Status is: Inpatient Remains inpatient appropriate because: IV diuresis and adjusting medical therapy   Planned Discharge Destination: Home     Author: Coralie Keens, MD 04/14/2023 1:24 PM  For on call review [email or web address].

## 2023-04-15 DIAGNOSIS — I482 Chronic atrial fibrillation, unspecified: Secondary | ICD-10-CM

## 2023-04-15 DIAGNOSIS — N183 Chronic kidney disease, stage 3 unspecified: Secondary | ICD-10-CM | POA: Diagnosis not present

## 2023-04-15 DIAGNOSIS — I5033 Acute on chronic diastolic (congestive) heart failure: Secondary | ICD-10-CM | POA: Diagnosis not present

## 2023-04-15 DIAGNOSIS — I10 Essential (primary) hypertension: Secondary | ICD-10-CM | POA: Diagnosis not present

## 2023-04-15 LAB — BASIC METABOLIC PANEL
Anion gap: 6 (ref 5–15)
BUN: 24 mg/dL — ABNORMAL HIGH (ref 8–23)
CO2: 29 mmol/L (ref 22–32)
Calcium: 8.5 mg/dL — ABNORMAL LOW (ref 8.9–10.3)
Chloride: 107 mmol/L (ref 98–111)
Creatinine, Ser: 1.8 mg/dL — ABNORMAL HIGH (ref 0.61–1.24)
GFR, Estimated: 42 mL/min — ABNORMAL LOW (ref >60)
Glucose, Bld: 113 mg/dL — ABNORMAL HIGH (ref 70–99)
Potassium: 3.6 mmol/L (ref 3.5–5.1)
Sodium: 142 mmol/L (ref 135–145)

## 2023-04-15 LAB — GLUCOSE, CAPILLARY
Glucose-Capillary: 104 mg/dL — ABNORMAL HIGH (ref 70–99)
Glucose-Capillary: 77 mg/dL (ref 70–99)
Glucose-Capillary: 78 mg/dL (ref 70–99)
Glucose-Capillary: 85 mg/dL (ref 70–99)
Glucose-Capillary: 97 mg/dL (ref 70–99)

## 2023-04-15 NOTE — Progress Notes (Signed)
 Progress Note  Patient Name: Justin Molina. Date of Encounter: 04/15/2023  Primary Cardiologist: None   Subjective   Patient seen and examined   Inpatient Medications    Scheduled Meds:  amLODipine  10 mg Oral Daily   apixaban  5 mg Oral BID   atorvastatin  80 mg Oral QHS   carvedilol  25 mg Oral BID WC   doxazosin  8 mg Oral QHS   empagliflozin  10 mg Oral Daily   furosemide  60 mg Intravenous Daily   hydrALAZINE  100 mg Oral TID   insulin aspart  0-9 Units Subcutaneous TID WC   isosorbide mononitrate  120 mg Oral Daily   polyethylene glycol  17 g Oral Daily   senna  1 tablet Oral QHS   spironolactone  50 mg Oral Daily   Continuous Infusions:  PRN Meds: acetaminophen **OR** acetaminophen, albuterol, polyvinyl alcohol   Vital Signs    Vitals:   04/14/23 2131 04/15/23 0027 04/15/23 0525 04/15/23 0730  BP: (!) 135/91 135/87 (!) 144/96 (!) 139/100  Pulse:   83 90  Resp:  18 20 18   Temp:  97.9 F (36.6 C) 97.9 F (36.6 C) 98.4 F (36.9 C)  TempSrc:  Oral Oral Oral  SpO2:  97% 99% 99%  Weight:   120.7 kg   Height:   5\' 11"  (1.803 m)     Intake/Output Summary (Last 24 hours) at 04/15/2023 0850 Last data filed at 04/15/2023 1610 Gross per 24 hour  Intake 520 ml  Output 2550 ml  Net -2030 ml   Filed Weights   04/14/23 0415 04/14/23 0627 04/15/23 0525  Weight: 121.7 kg 121.7 kg 120.7 kg    Telemetry    Atrial fibrillation - Personally Reviewed  ECG     - Personally Reviewed  Physical Exam    General: Comfortable Head: Atraumatic, normal size  Eyes: PEERLA, EOMI  Neck: Supple, normal JVD Cardiac: Normal S1, S2; RRR; no murmurs, rubs, or gallops Lungs: Clear to auscultation bilaterally Abd: Soft, nontender, no hepatomegaly  Ext: warm, no edema Musculoskeletal: No deformities, BUE and BLE strength normal and equal Skin: Warm and dry, no rashes   Neuro: Alert and oriented to person, place, time, and situation, CNII-XII grossly intact, no  focal deficits  Psych: Normal mood and affect   Labs    Chemistry Recent Labs  Lab 04/08/23 1542 04/09/23 0429 04/10/23 0306 04/13/23 0245 04/14/23 0357 04/15/23 0233  NA 143 143   < > 142 143 142  K 3.8 3.1*   < > 3.3* 3.5 3.6  CL 109 111   < > 109 109 107  CO2 26 23   < > 27 26 29   GLUCOSE 83 85   < > 92 96 113*  BUN 19 16   < > 19 19 24*  CREATININE 1.88* 1.81*   < > 1.65* 1.56* 1.80*  CALCIUM 8.2* 7.8*   < > 8.1* 8.6* 8.5*  PROT 8.0 6.9  --   --   --   --   ALBUMIN 3.6 3.0*  --   --   --   --   AST 24 14*  --   --   --   --   ALT 14 11  --   --   --   --   ALKPHOS 73 59  --   --   --   --   BILITOT 1.4* 0.8  --   --   --   --  GFRNONAA 40* 42*   < > 47* 50* 42*  ANIONGAP 8 9   < > 6 8 6    < > = values in this interval not displayed.     Hematology Recent Labs  Lab 04/08/23 1542 04/09/23 0429  WBC 7.6 4.9  RBC 4.38 4.25  HGB 11.2* 10.8*  HCT 35.9* 34.5*  MCV 82.0 81.2  MCH 25.6* 25.4*  MCHC 31.2 31.3  RDW 17.2* 17.3*  PLT 212 196    Cardiac EnzymesNo results for input(s): "TROPONINI" in the last 168 hours. No results for input(s): "TROPIPOC" in the last 168 hours.   BNP Recent Labs  Lab 04/08/23 1542  BNP 746.8*     DDimer No results for input(s): "DDIMER" in the last 168 hours.   Radiology    No results found.  Cardiac Studies   Echo from 01/2023 reviewed   Patient Profile     63 y.o. male with a history of chronic HFpEF, PFO, atrial fibrillation, hypertension, hyperlipidemia, CVA who presented on March 4 with CHF exacerbation.   Assessment & Plan    Acute on Chronic heart failure with preserved ejection fraction - clinically improving, shortness of breath improving. Plan to transition to oral diuretics by tomorrow.   Hypertension- blood pressure remains elevated despite current regimen which includes amlodipine 10 mg daily, carvedilol 25 mg twice daily, hydralazine 100 mg 3 times daily, Imdur 120 mg daily, spironolactone 50 mg daily  also on doxazosin 8 mg at bedtime.  Still not at target.  Since his hydralazine was increased yesterday with the spironolactone I will wait today and see his blood pressure response before making adjustments.  If needed we will go up on spironolactone to 50 mg twice daily to achieve a daily dosing of 100 mg.  Persistent atrial fibrillation-in A-fib today but is rate controlled.  Continue Coreg and Eliquis.  CKD - baseline is suspected to be between 1.5-1.6. Cr slightly elevated 1.8 today.   Hyperlipidemia -continue with current statin medication.    For questions or updates, please contact [facility] Please consult [email or web address] for contact info under Cardiology/STEMI.      Signed, Thomasene Ripple, DO  04/15/2023, 8:50 AM

## 2023-04-15 NOTE — Plan of Care (Signed)
  Problem: Education: Goal: Knowledge of General Education information will improve Description: Including pain rating scale, medication(s)/side effects and non-pharmacologic comfort measures Outcome: Progressing   Problem: Clinical Measurements: Goal: Ability to maintain clinical measurements within normal limits will improve Outcome: Progressing Goal: Will remain free from infection Outcome: Progressing   Problem: Activity: Goal: Risk for activity intolerance will decrease Outcome: Progressing   Problem: Safety: Goal: Ability to remain free from injury will improve Outcome: Progressing   Problem: Skin Integrity: Goal: Risk for impaired skin integrity will decrease Outcome: Progressing

## 2023-04-15 NOTE — Progress Notes (Signed)
 Progress Note   Patient: Justin Molina. WJX:[id number] DOB: 1960-07-16 DOA: 04/08/2023     6 DOS: the patient was seen and examined on 04/15/2023   Brief hospital course: Justin Molina was admitted to the hospital with the working diagnosis of heart failure exacerbation.   63 y.o. 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  Recent hospitalization for CHF exacerbation 2/10-2/13, discharged on lasix 40mg  daily which was increased by his PCP about a week prior to admission to 80 mg daily with no significant improvement in his symptoms.  On his initial physical examination his blood pressure was 160/104, HR 53, RR 26 and 92 saturation 98%, lungs with no wheezing or rhonchi, heart with S1 and S2 present and regular with no gallops, abdomen with no distention and positive lower extremity edema.   Na 143, K 3.8 Cl 109, bicarbonate 26, glucose 83 bun 19 cr 1,88  BNP 746  Wbc 7,6 hgb 11.2 plt 212  Urine analysis SG 1,015, protein 30, glucose >500, small hgb and  negative leukocytes.   CT head with no acute intracranial abnormalities. Unchanged gliosis and encephalomalacia through the left hemisphere.   Chest radiograph with hypoinflation, positive cardiomegaly, bilateral hilar vascular congestion with cephalization of the vasculature, positive fluid in the right fissure, with small right pleural effusion.   EKG 95 bpm, left axis deviation, qtc 540, atrial fibrillation rhythm with no significant ST segment or  T wave changes.   Patient has been placed on IV furosemide for diuresis.   03/08 responding well to diuresis.  03/09 continue to adjust his medical regimen.  03/10 continue adjusting blood pressure regimen, possible discharge home tomorrow.   Assessment and Plan: * Acute on  chronic diastolic CHF (congestive heart failure) (HCC) Echocardiogram with preserved LV systolic function with EF 60 to 65%, mild LVH, RV systolic function preserved, no significant valvular disease. Trivial pericardial effusion. Mild LA dilatation.   Urine output is 2,550 ml Systolic blood pressure 140 with diastolic in the 90 to 100 mmHg range.  Diuresis with furosemide 60 mg IV daily. Carvedilol, hydralazine and isosorbide (dose increased to 100 mg tid).  Spironolactone and SGLT 2 inh.  To consider transition to oral loop diuretic.  Limited pharmacologic options due to acutely reduced GFR  Atrial fibrillation, chronic (HCC) Continue rate control with carvedilol and anticoagulation with apixaban.  Continue telemetry monitoring.   CKD (chronic kidney disease) stage 3, GFR 30-59 ml/min (HCC) AKI on CKD stage 3a. Hypokalemia and hypernatremia.   Renal function today with serum cr at 1,8 with K at 3,6 and serum bicarbonate at 29  Na 142   Continue diuresis with furosemide, spironolactone and SGLT 2 inh.   Anemia of chronic renal disease, stable.   Essential hypertension Elevated diastolic blood pressure.  Difficult to control hypertension.  Currently on:  Hydralazine 100 mg tid  Isosorbide, 120 mg daily  Amlodipine 10 mg daily   Carvedilol 25 mg bid  Doxazosin 8 mg at bedtime  Spironolactone 50 mg daily   Type 2 diabetes mellitus with hyperlipidemia (HCC) Continue glucose cover and monitoring with insulin sliding scale.  Fasting gluocse this morning 113 mg/dl.   Continue with statin therapy  History of CVA with residual deficit Continue blood pressure control, statin and anticoagulation with apixaban.  Left sided weakness.   Open wound of  right lower extremity Continue local wound care.       Subjective: Patient with no chest pain or dyspnea, has been out of bed and ambulating with physical therapy.    Physical Exam: Vitals:   04/15/23 0027 04/15/23 0525  04/15/23 0730 04/15/23 1130  BP: 135/87 (!) 144/96 (!) 139/100 (!) 152/106  Pulse:  83 90 93  Resp: 18 20 18 18   Temp: 97.9 F (36.6 C) 97.9 F (36.6 C) 98.4 F (36.9 C) 98 F (36.7 C)  TempSrc: Oral Oral Oral Oral  SpO2: 97% 99% 99% 99%  Weight:  120.7 kg    Height:  5\' 11"  (1.803 m)     Neurology awake and alert, chronic left sided weakness  ENT with no pallor or icterus Cardiovascular with S1 and S2 present, irregularly irregular with no gallops, rubs or murmurs Respiratory with no rales or wheezing, no rhonchi Abdomen with no distention  Trace lower extremity edema  Data Reviewed:    Family Communication: I spoke with patient's wife at the bedside, we talked in detail about patient's condition, plan of care and prognosis and all questions were addressed.   Disposition: Status is: Inpatient Remains inpatient appropriate because: possible discharge  home tomorrow   Planned Discharge Destination: Home      Author: Coralie Keens, MD 04/15/2023 2:56 PM  For on call review [email or web address].

## 2023-04-15 NOTE — Plan of Care (Signed)
  Problem: Health Behavior/Discharge Planning: Goal: Ability to manage health-related needs will improve Outcome: Progressing   Problem: Clinical Measurements: Goal: Will remain free from infection Outcome: Progressing Goal: Respiratory complications will improve Outcome: Progressing Goal: Cardiovascular complication will be avoided Outcome: Progressing   Problem: Safety: Goal: Ability to remain free from injury will improve Outcome: Progressing   Problem: Activity: Goal: Capacity to carry out activities will improve Outcome: Progressing   Problem: Coping: Goal: Ability to adjust to condition or change in health will improve Outcome: Progressing   Problem: Health Behavior/Discharge Planning: Goal: Ability to identify and utilize available resources and services will improve Outcome: Progressing   Problem: Nutritional: Goal: Maintenance of adequate nutrition will improve Outcome: Progressing   Problem: Tissue Perfusion: Goal: Adequacy of tissue perfusion will improve Outcome: Progressing

## 2023-04-15 NOTE — Progress Notes (Signed)
 Physical Therapy Treatment Patient Details Name: Justin Molina. MRN: [id number] DOB: 1960-08-09 Today's Date: 04/15/2023   History of Present Illness Pt is a 63 y.o. M presenting to ED on 3/3 with orthopnea, dyspnea, and BLE edema, admitted for acute on chronic HFpEF. Recent admission for CHF exacerbation 2/10-2/13-2025. PMH includes HFpEF, HTN, HLD, CKD IIIA, A fib, ICH with R hemiparesis, venous insufficiency, NIDT2DM, obesity.   PT Comments  Pt engaged in gait training using upright rollator. Educated pt/wife on proper sequencing, mechanics, features, sizing recommendations, and where assistance would be required. Pt demonstrated improve mechanics and confidence with the upright rollator. He ambulated ~51ft with one standing rest break, decreased posture sway, and CGA at trunk for stability and minA on R of upright rollator for safety. Pt was receptive to VC/TC for gait correction. He displayed good safety awareness maintaining body close to the AD while ambulating and transferring to sit down and stand up from device, navigating obstacles, and identifying when to rest. Recommend continued use of hemi walker for short distance ambulation as pt is modI. The upright rollator is an alternative option to a manual w/c providing pt with energy conservation during mobility. Pt is making great progress towards his acute PT goals. Will continue to follow and advance appropriately.     If plan is discharge home, recommend the following: A little help with walking and/or transfers;A little help with bathing/dressing/bathroom;Assistance with cooking/housework;Assist for transportation   Can travel by private vehicle        Equipment Recommendations  None recommended by PT    Recommendations for Other Services       Precautions / Restrictions Precautions Precautions: Fall Recall of Precautions/Restrictions: Intact Precaution/Restrictions Comments: R hemi Required Braces or Orthoses: Other  Brace Other Brace: RLE AFO Restrictions Weight Bearing Restrictions Per Provider Order: No     Mobility  Bed Mobility               General bed mobility comments: Not assessed. Pt greeted in chair next to sink at start of session. He opted to sit EOB at end of session as his wife was in the recliner chair.    Transfers Overall transfer level: Needs assistance Equipment used:  Paediatric nurse) Transfers: Sit to/from Stand Sit to Stand: Contact guard assist           General transfer comment: Pt stood from chair, lowest bed height, and upright rollator seat. He powered up to stand by pushing with LUE and increase fwd flex. He was able to turn around with intermittent support on upright rollator. Good eccentric control with sitting. Pt required CGA for safety.    Ambulation/Gait Ambulation/Gait assistance: Contact guard assist, Min assist Gait Distance (Feet): 80 Feet Assistive device:  (Upright Rollator) Gait Pattern/deviations: Step-to pattern, Decreased stride length, Knee hyperextension - right, Trunk flexed, Decreased weight shift to right, Decreased stance time - right Gait velocity: reduced Gait velocity interpretation: <1.8 ft/sec, indicate of risk for recurrent falls   General Gait Details: Pt advanced RLE by circumduction, hitting inside of upright rollator. Cued pt to take a couple steps to the left for increased room and to aim RLE under the basket of the upright rollator. He demonstrated improved technique and reported increased confidence with mobility. Pt requires minA on R side of upright rollator for increased stability and support as B forearms were not fully supported by armrest pads d/t pt's height and inability to further raise the upright rollator to accomodate pt. Pt turned R/L  staying inside the AD, with slow controlled steps. He was able to turn around and sit down and get up from rollator with CGA at trunk and LUE support. His gait speed is reduced  compared to hemiwalker; however, pt reports less fatigue and appreciates being able to rest with BUE supported in standing or having the option to sit down. Pt demonstrated good safety awareness easily navigating obstacles in the hallway/room and self-identifying when he needed to rest.   Stairs             Wheelchair Mobility     Tilt Bed    Modified Rankin (Stroke Patients Only)       Balance Overall balance assessment: Needs assistance Sitting-balance support: Single extremity supported, Feet supported Sitting balance-Leahy Scale: Fair Sitting balance - Comments: Pt sat EOB with supervision.   Standing balance support: Reliant on assistive device for balance, Single extremity supported, During functional activity Standing balance-Leahy Scale: Fair Standing balance comment: Pt depends on LUE support for transfers and gait using AD. No overt LOB.                            Communication Communication Communication: Impaired Factors Affecting Communication: Difficulty expressing self (Baseline for pt secondary to CVA)  Cognition Arousal: Alert Behavior During Therapy: WFL for tasks assessed/performed   PT - Cognitive impairments: No apparent impairments                         Following commands: Intact      Cueing Cueing Techniques: Verbal cues, Tactile cues, Visual cues  Exercises      General Comments General comments (skin integrity, edema, etc.): Wife present throughout session. Reviewed with pt/wife that he would require assistance when using upright rollator as the four wheels provides increased motion, higher risk of instability, and he is less familiar with it. Discussed appropriate sizing of upright rollator, recommend bariatric for pt to have wider space to advance RLE and improved shoulder space when sitting on AD. Discussed the potential modifications that could be done to the upright rollator, such as breaks being moved to L side  given RUE hemiplegia. Continue to recommend pt utilize hemi walker for short distance ambulation given his modI, increased comfort level, and gait speed.      Pertinent Vitals/Pain Pain Assessment Pain Assessment: No/denies pain    Home Living                          Prior Function            PT Goals (current goals can now be found in the care plan section) Acute Rehab PT Goals Patient Stated Goal: Return Home PT Goal Formulation: With patient Time For Goal Achievement: 04/24/23 Progress towards PT goals: Progressing toward goals    Frequency    Min 2X/week      PT Plan      Co-evaluation              AM-PAC PT "6 Clicks" Mobility   Outcome Measure  Help needed turning from your back to your side while in a flat bed without using bedrails?: A Little Help needed moving from lying on your back to sitting on the side of a flat bed without using bedrails?: A Little Help needed moving to and from a bed to a chair (including a wheelchair)?: A Little Help  needed standing up from a chair using your arms (e.g., wheelchair or bedside chair)?: A Little Help needed to walk in hospital room?: A Little Help needed climbing 3-5 steps with a railing? : A Lot 6 Click Score: 17    End of Session Equipment Utilized During Treatment: Gait belt Activity Tolerance: Patient tolerated treatment well Patient left: in bed;with call bell/phone within reach;with family/visitor present Nurse Communication: Mobility status PT Visit Diagnosis: Other abnormalities of gait and mobility (R26.89);Unsteadiness on feet (R26.81) Hemiplegia - Right/Left: Right     Time: 0981-1914 PT Time Calculation (min) (ACUTE ONLY): 38 min  Charges:    $Gait Training: 38-52 mins PT General Charges $$ ACUTE PT VISIT: 1 Visit                     Cheri Guppy, PT, DPT Acute Rehabilitation Services Office: [phone] Secure Chat Preferred  Richardson Chiquito 04/15/2023, 4:08 PM

## 2023-04-15 NOTE — TOC Progression Note (Signed)
 Transition of Care (TOC) - Progression Note    Patient Details  Name: Justin Molina. MRN: [id number] Date of Birth: 05-01-1960  [facility] of [facility]) CM/SW Contact  Leone Haven, RN Phone Number: 04/15/2023, 4:58 PM  Clinical Narrative:    Patient is active with [facility] for [facility], HHPT, HHOT, will need resumption orders, conts on iv lasix. TOC following.   Expected Discharge Plan: Home w Home Health Services Barriers to Discharge: Continued Medical Work up  Expected Discharge Plan and Services In-house Referral: NA Discharge Planning Services: CM Consult Post Acute Care Choice: Home Health Living arrangements for the past 2 months: Apartment                 DME Arranged: N/A DME Agency: NA       HH Arranged: RN, PT, OT HH Agency: [facility] Date HH Agency Contacted: 04/10/23 Time HH Agency Contacted: 1633 Representative spoke with at [facility] Agency: Haywood Lasso   Social Determinants of Health (SDOH) Interventions SDOH Screenings   Food Insecurity: No Food Insecurity (04/09/2023)  Housing: Low Risk  (04/09/2023)  Transportation Needs: No Transportation Needs (04/09/2023)  Utilities: Not At Risk (04/09/2023)  Tobacco Use: Low Risk  (04/09/2023)    Readmission Risk Interventions    04/10/2023    4:30 PM  Readmission Risk Prevention Plan  Transportation Screening Complete  PCP or Specialist Appt within 3-5 Days Complete  HRI or Home Care Consult Complete  Palliative Care Screening Not Applicable  Medication Review (RN Care Manager) Complete

## 2023-04-16 ENCOUNTER — Ambulatory Visit: Payer: Medicare PPO

## 2023-04-16 ENCOUNTER — Other Ambulatory Visit (HOSPITAL_COMMUNITY): Payer: Self-pay

## 2023-04-16 DIAGNOSIS — I10 Essential (primary) hypertension: Secondary | ICD-10-CM | POA: Diagnosis not present

## 2023-04-16 DIAGNOSIS — I5033 Acute on chronic diastolic (congestive) heart failure: Secondary | ICD-10-CM | POA: Diagnosis not present

## 2023-04-16 DIAGNOSIS — I482 Chronic atrial fibrillation, unspecified: Secondary | ICD-10-CM | POA: Diagnosis not present

## 2023-04-16 DIAGNOSIS — N183 Chronic kidney disease, stage 3 unspecified: Secondary | ICD-10-CM | POA: Diagnosis not present

## 2023-04-16 LAB — BASIC METABOLIC PANEL
Anion gap: 9 (ref 5–15)
BUN: 25 mg/dL — ABNORMAL HIGH (ref 8–23)
CO2: 27 mmol/L (ref 22–32)
Calcium: 8.4 mg/dL — ABNORMAL LOW (ref 8.9–10.3)
Chloride: 105 mmol/L (ref 98–111)
Creatinine, Ser: 1.76 mg/dL — ABNORMAL HIGH (ref 0.61–1.24)
GFR, Estimated: 43 mL/min — ABNORMAL LOW (ref >60)
Glucose, Bld: 80 mg/dL (ref 70–99)
Potassium: 3.5 mmol/L (ref 3.5–5.1)
Sodium: 141 mmol/L (ref 135–145)

## 2023-04-16 LAB — GLUCOSE, CAPILLARY
Glucose-Capillary: 74 mg/dL (ref 70–99)
Glucose-Capillary: 115 mg/dL — ABNORMAL HIGH (ref 70–99)
Glucose-Capillary: 80 mg/dL (ref 70–99)

## 2023-04-16 LAB — MAGNESIUM: Magnesium: 2.2 mg/dL (ref 1.7–2.4)

## 2023-04-16 MED ORDER — EMPAGLIFLOZIN 10 MG PO TABS
10.0000 mg | ORAL_TABLET | Freq: Every day | ORAL | 0 refills | Status: AC
  Filled 2023-04-16: qty 30, 30d supply, fill #0

## 2023-04-16 MED ORDER — SPIRONOLACTONE 50 MG PO TABS
50.0000 mg | ORAL_TABLET | Freq: Every day | ORAL | 0 refills | Status: DC
  Filled 2023-04-16: qty 30, 30d supply, fill #0

## 2023-04-16 MED ORDER — HYDRALAZINE HCL 100 MG PO TABS
100.0000 mg | ORAL_TABLET | Freq: Three times a day (TID) | ORAL | 0 refills | Status: DC
  Filled 2023-04-16: qty 90, 30d supply, fill #0

## 2023-04-16 MED ORDER — AMLODIPINE BESYLATE 10 MG PO TABS
10.0000 mg | ORAL_TABLET | Freq: Every day | ORAL | 0 refills | Status: DC
  Filled 2023-04-16: qty 30, 30d supply, fill #0

## 2023-04-16 MED ORDER — SPIRONOLACTONE 50 MG PO TABS
25.0000 mg | ORAL_TABLET | Freq: Every day | ORAL | 0 refills | Status: DC
  Filled 2023-04-16: qty 30, 60d supply, fill #0

## 2023-04-16 MED ORDER — FUROSEMIDE 40 MG PO TABS
40.0000 mg | ORAL_TABLET | Freq: Every day | ORAL | Status: DC
  Administered 2023-04-16: 40 mg via ORAL
  Filled 2023-04-16: qty 1

## 2023-04-16 MED ORDER — ISOSORBIDE MONONITRATE ER 120 MG PO TB24
120.0000 mg | ORAL_TABLET | Freq: Every day | ORAL | 0 refills | Status: DC
  Filled 2023-04-16: qty 30, 30d supply, fill #0

## 2023-04-16 MED ORDER — ATORVASTATIN CALCIUM 80 MG PO TABS
80.0000 mg | ORAL_TABLET | Freq: Every day | ORAL | 0 refills | Status: AC
  Filled 2023-04-16: qty 30, 30d supply, fill #0

## 2023-04-16 MED ORDER — CARVEDILOL 25 MG PO TABS
25.0000 mg | ORAL_TABLET | Freq: Two times a day (BID) | ORAL | 0 refills | Status: DC
  Filled 2023-04-16: qty 60, 30d supply, fill #0

## 2023-04-16 MED ORDER — FUROSEMIDE 40 MG PO TABS
40.0000 mg | ORAL_TABLET | Freq: Every day | ORAL | 0 refills | Status: DC
  Filled 2023-04-16: qty 30, 30d supply, fill #0

## 2023-04-16 MED ORDER — DOXAZOSIN MESYLATE 4 MG PO TABS
8.0000 mg | ORAL_TABLET | Freq: Every day | ORAL | 0 refills | Status: DC
  Filled 2023-04-16: qty 60, 30d supply, fill #0

## 2023-04-16 NOTE — Discharge Summary (Addendum)
 Physician Discharge Summary   Patient: Justin Molina. MRN: [id number] DOB: 1960/02/08  Admit date:     04/08/2023  Discharge date: 04/16/23  Discharge Physician: York Ram Fergus Throne   PCP: Theodis Shove, DO   Recommendations at discharge:    Patient has been placed on aggressive blood pressure regimen with doxazosin, hydralazine and isosorbide.  Because acutely reduced GFR, will hold on spironolactone for now.  Diuretic therapy with furosemide 40 mg daily  Follow up with Dr Lorrene Reid in 7 to 10 days Follow up renal function and electrolytes in 7 days.  Follow up with Cardiology as scheduled.   I spoke with patient's wife over the phone at the bedside, we talked in detail about patient's condition, plan of care and prognosis and all questions were addressed.   Discharge Diagnoses: Principal Problem:   Acute on chronic diastolic CHF (congestive heart failure) (HCC) Active Problems:   Atrial fibrillation, chronic (HCC)   CKD (chronic kidney disease) stage 3, GFR 30-59 ml/min (HCC)   Essential hypertension   Type 2 diabetes mellitus with hyperlipidemia (HCC)   History of CVA with residual deficit   Open wound of right lower extremity   Persistent atrial fibrillation (HCC)  Resolved Problems:   * No resolved hospital problems. [facility] Course: Mr. Parker was admitted to the hospital with the working diagnosis of heart failure exacerbation.   63 y.o. 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  Recent hospitalization for CHF exacerbation 2/10-2/13, discharged on lasix 40mg  daily which was increased by his primary care provider about a week prior to admission to 80 mg daily with no significant improvement in his symptoms.  On his initial physical  examination his blood pressure was 160/104, HR 53, RR 26 and 92 saturation 98%, lungs with no wheezing or rhonchi, heart with S1 and S2 present and regular with no gallops, abdomen with no distention and positive lower extremity edema.   Na 143, K 3.8 Cl 109, bicarbonate 26, glucose 83 bun 19 cr 1,88  BNP 746  Wbc 7,6 hgb 11.2 plt 212  Urine analysis SG 1,015, protein 30, glucose >500, small hgb and  negative leukocytes.   CT head with no acute intracranial abnormalities. Unchanged gliosis and encephalomalacia through the left hemisphere.   Chest radiograph with hypoinflation, positive cardiomegaly, bilateral hilar vascular congestion with cephalization of the vasculature, positive fluid in the right fissure, with small right pleural effusion.   EKG 95 bpm, left axis deviation, qtc 540, atrial fibrillation rhythm with no significant ST segment or  T wave changes.   Patient has been placed on IV furosemide for diuresis.   03/08 responding well to diuresis.  03/09 continue to adjust his medical regimen.  03/10 continue adjusting blood pressure regimen, possible discharge home tomorrow.  03/11 patient with improved in volume status and blood pressure, he will need close follow up as outpatient.   Assessment and Plan: * Acute on chronic diastolic CHF (congestive heart failure) (HCC) Echocardiogram with preserved LV systolic function with EF 60 to 65%, mild LVH, RV systolic function preserved, no significant valvular disease. Trivial pericardial effusion. Mild LA dilatation.   Patient was placed on IV furosemide for diuresis, negative fluid balance was achieved, - 13,932 ml, with significant improvement in his symptoms.   Patient will continue loop diuretic with  furosemide 40 mg daily.  Continue medical therapy with carvedilol, hydralazine , isosorbide and SGLT 2 inh.  Considering low GFR and risk of hyperkalemia, will wait on spironolactone until renal function follow up as outpatient.    Patient will need close follow up as outpatient.   Atrial fibrillation, chronic (HCC) Continue rate control with carvedilol and anticoagulation with apixaban.   CKD (chronic kidney disease) stage 3, GFR 30-59 ml/min (HCC) AKI on CKD stage 3a. Hypokalemia and hypernatremia.   At the time of discharge his renal function has a serum cr of 1,7 with K at 3.5 and serum bicarbonate at 27  Na 141 Mg 2.2   Continue diuresis with furosemide and SGLT 2 inh.  Hold on spironolactone for now, I am concerned about hyperkalemia in the setting of reduced GFR.  Will need close follow up on renal function and electrolytes.   Anemia of chronic renal disease, stable.   Essential hypertension Elevated diastolic blood pressure.  Difficult to control hypertension.   During his hospitalization he was placed on:  Hydralazine 100 mg tid  Isosorbide, 120 mg daily  Amlodipine 10 mg daily   Carvedilol 25 mg bid  Doxazosin 8 mg at bedtime  Spironolactone 50 mg daily   Considering low GFR, will hold on spironolactone for now and will have recheck renal function as outpatient.   Type 2 diabetes mellitus with hyperlipidemia (HCC) Glucose remained stable, patient was placed on insulin sliding scale during his hospitalization.  On the day of discharge his fasting glucose was 80 mg/dl.   Continue with statin therapy  History of CVA with residual deficit Continue blood pressure control, statin and anticoagulation with apixaban.  Chronic Left sided weakness.   Open wound of right lower extremity Continue local wound care.          Consultants: cardiology  Procedures performed: none   Disposition: Home Diet recommendation:  Cardiac and Carb modified diet DISCHARGE MEDICATION: Allergies as of 04/16/2023       Reactions   Contrast Media [iodinated Contrast Media] Shortness Of Breath, Nausea And Vomiting, Swelling, Other (See Comments)   Facial swelling and felt flushed, too   Iodine Shortness  Of Breath, Nausea And Vomiting, Swelling, Other (See Comments)   Facial swelling and felt flushed, too        Medication List     STOP taking these medications    Lantus SoloStar 100 UNIT/ML Solostar Pen Generic drug: insulin glargine   Multaq 400 MG tablet Generic drug: dronedarone   potassium chloride 10 MEQ tablet Commonly known as: KLOR-CON M       TAKE these medications    acetaminophen 500 MG tablet Commonly known as: TYLENOL Take 1 tablet (500 mg total) by mouth every 6 (six) hours as needed. What changed:  how much to take reasons to take this   albuterol 108 (90 Base) MCG/ACT inhaler Commonly known as: VENTOLIN HFA Inhale 2 puffs into the lungs every 4 (four) hours as needed for wheezing or shortness of breath.   amLODipine 10 MG tablet Commonly known as: NORVASC Take 1 tablet (10 mg total) by mouth daily. What changed:  medication strength how much to take   atorvastatin 80 MG tablet Commonly known as: LIPITOR Take 1 tablet (80 mg total) by mouth at bedtime.   carvedilol 25 MG tablet Commonly known as: COREG Take 1 tablet (25 mg total) by mouth 2 (two) times daily with a meal. Patient needs office visit for more refills  doxazosin 4 MG tablet Commonly known as: CARDURA Take 2 tablets (8 mg total) by mouth at bedtime. What changed:  medication strength when to take this   Eliquis 5 MG Tabs tablet Generic drug: apixaban Take 1 tablet (5 mg total) by mouth 2 (two) times daily.   empagliflozin 10 MG Tabs tablet Commonly known as: JARDIANCE Take 1 tablet (10 mg total) by mouth daily.   fluticasone 50 MCG/ACT nasal spray Commonly known as: FLONASE Place 2 sprays into both nostrils daily as needed for allergies or rhinitis.   furosemide 40 MG tablet Commonly known as: LASIX Take 1 tablet (40 mg total) by mouth daily. What changed: how much to take   glucose blood test strip Commonly known as: Accu-Chek Aviva Plus Use as instructed for  TID and QHS blood glucose testing   hydrALAZINE 100 MG tablet Commonly known as: APRESOLINE Take 1 tablet (100 mg total) by mouth 3 (three) times daily. What changed:  medication strength how much to take   isosorbide mononitrate 120 MG 24 hr tablet Commonly known as: IMDUR Take 1 tablet (120 mg total) by mouth daily.   Lancets 28G Misc Check blood sugar TID & QHS   loratadine 10 MG tablet Commonly known as: CLARITIN Take 10 mg by mouth daily as needed for allergies or rhinitis.   nystatin cream Commonly known as: MYCOSTATIN Apply 1 Application topically daily as needed (yeast).   Ozempic (0.25 or 0.5 MG/DOSE) 2 MG/3ML Sopn Generic drug: Semaglutide(0.25 or 0.5MG /DOS) Inject 0.25 mg into the skin every Sunday.        Follow-up Information     Combs, Prince Solian, DO Follow up on 04/16/2023.   Specialty: Geriatric Medicine Why: 9:30 for hospital follow up Contact information: [address] [place] [address] [phone]                Discharge Exam: Filed Weights   04/14/23 0627 04/15/23 0525 04/16/23 0428  Weight: 121.7 kg 120.7 kg 124 kg   BP 138/74 (BP Location: Left Arm)   Pulse 77   Temp 97.6 F (36.4 C) (Oral)   Resp 16   Ht 5\' 11"  (1.803 m)   Wt 124 kg   SpO2 98%   BMI 38.13 kg/m   Neurology awake and alert ENT with no pallor Cardiovascular with S1 and S2 present, irregularly irregular with no gallops, rubs or murmurs No JVD No lower extremity edema Respiratory with no rales or wheezing, no rhonchi Abdomen with no distention   Condition at discharge: stable  The results of significant diagnostics from this hospitalization (including imaging, microbiology, ancillary and laboratory) are listed below for reference.   Imaging Studies: DG Chest 2 View Result Date: 04/08/2023 CLINICAL DATA:  Shortness of breath. Bilateral leg swelling. Weight gain. EXAM: CHEST - 2 VIEW COMPARISON:  03/18/2023 FINDINGS: Stable enlarged cardiac  silhouette and tortuous aorta. Clear lungs with normal vascularity. Interval small right pleural effusion. Unremarkable bones IMPRESSION: 1. Interval small right pleural effusion. 2. Stable cardiomegaly. Electronically Signed   By: Beckie Salts M.D.   On: 04/08/2023 18:07   CT Head Wo Contrast Result Date: 04/08/2023 CLINICAL DATA:  Subconjunctival hemorrhage EXAM: CT HEAD WITHOUT CONTRAST TECHNIQUE: Contiguous axial images were obtained from the base of the skull through the vertex without intravenous contrast. RADIATION DOSE REDUCTION: This exam was performed according to the departmental dose-optimization program which includes automated exposure control, adjustment of the mA and/or kV according to patient size and/or use of iterative reconstruction  technique. COMPARISON:  01/20/2023 FINDINGS: Brain: Unchanged gliosis and encephalomalacia throughout the left hemisphere. No acute hemorrhage. Dural thickening underlying the craniotomy site is unchanged. Chronic ischemic white matter changes. Vascular: No hyperdense vessel or unexpected vascular calcification. Skull: The visualized skull base, calvarium and extracranial soft tissues are normal. Sinuses/Orbits: No fluid levels or advanced mucosal thickening of the visualized paranasal sinuses. No mastoid or middle ear effusion. Normal orbits. Other: None. IMPRESSION: 1. No acute intracranial abnormality. 2. Unchanged gliosis and encephalomalacia throughout the left hemisphere. Electronically Signed   By: Deatra Robinson M.D.   On: 04/08/2023 15:53   DG Chest 2 View Result Date: 03/18/2023 CLINICAL DATA:  Short of breath EXAM: CHEST - 2 VIEW COMPARISON:  01/17/2023 FINDINGS: Frontal and lateral views of the chest are obtained on 3 images. The cardiac silhouette is enlarged but stable. There is persistent pulmonary vascular congestion, with no airspace disease, effusion, or pneumothorax. No acute bony abnormalities. IMPRESSION: 1. Stable enlarged cardiac silhouette  and chronic pulmonary vascular congestion. No overt edema. Electronically Signed   By: Sharlet Salina M.D.   On: 03/18/2023 17:19    Microbiology: Results for orders placed or performed during the hospital encounter of 03/18/23  Resp panel by RT-PCR (RSV, Flu A&B, Covid) Anterior Nasal Swab     Status: None   Collection Time: 03/18/23  8:16 PM   Specimen: Anterior Nasal Swab  Result Value Ref Range Status   SARS Coronavirus 2 by RT PCR NEGATIVE NEGATIVE Final    Comment: (NOTE) SARS-CoV-2 target nucleic acids are NOT DETECTED.  The SARS-CoV-2 RNA is generally detectable in upper respiratory specimens during the acute phase of infection. The lowest concentration of SARS-CoV-2 viral copies this assay can detect is 138 copies/mL. A negative result does not preclude SARS-Cov-2 infection and should not be used as the sole basis for treatment or other patient management decisions. A negative result may occur with  improper specimen collection/handling, submission of specimen other than nasopharyngeal swab, presence of viral mutation(s) within the areas targeted by this assay, and inadequate number of viral copies(<138 copies/mL). A negative result must be combined with clinical observations, patient history, and epidemiological information. The expected result is Negative.  Fact Sheet for Patients:  BloggerCourse.com  Fact Sheet for Healthcare Providers:  SeriousBroker.it  This test is no t yet approved or cleared by the [place] FDA and  has been authorized for detection and/or diagnosis of SARS-CoV-2 by FDA under an Emergency Use Authorization (EUA). This EUA will remain  in effect (meaning this test can be used) for the duration of the COVID-19 declaration under Section 564(b)(1) of the Act, 21 U.S.C.section 360bbb-3(b)(1), unless the authorization is terminated  or revoked sooner.       Influenza A by PCR NEGATIVE NEGATIVE  Final   Influenza B by PCR NEGATIVE NEGATIVE Final    Comment: (NOTE) The Xpert Xpress SARS-CoV-2/FLU/RSV plus assay is intended as an aid in the diagnosis of influenza from Nasopharyngeal swab specimens and should not be used as a sole basis for treatment. Nasal washings and aspirates are unacceptable for Xpert Xpress SARS-CoV-2/FLU/RSV testing.  Fact Sheet for Patients: BloggerCourse.com  Fact Sheet for Healthcare Providers: SeriousBroker.it  This test is not yet approved or cleared by the [place] FDA and has been authorized for detection and/or diagnosis of SARS-CoV-2 by FDA under an Emergency Use Authorization (EUA). This EUA will remain in effect (meaning this test can be used) for the duration of the COVID-19 declaration under Section  564(b)(1) of the Act, 21 U.S.C. section 360bbb-3(b)(1), unless the authorization is terminated or revoked.     Resp Syncytial Virus by PCR NEGATIVE NEGATIVE Final    Comment: (NOTE) Fact Sheet for Patients: BloggerCourse.com  Fact Sheet for Healthcare Providers: SeriousBroker.it  This test is not yet approved or cleared by the [place] FDA and has been authorized for detection and/or diagnosis of SARS-CoV-2 by FDA under an Emergency Use Authorization (EUA). This EUA will remain in effect (meaning this test can be used) for the duration of the COVID-19 declaration under Section 564(b)(1) of the Act, 21 U.S.C. section 360bbb-3(b)(1), unless the authorization is terminated or revoked.  Performed at [facility], [address] [place], [place] [address]     Labs: CBC: No results for input(s): "WBC", "NEUTROABS", "HGB", "HCT", "MCV", "PLT" in the last 168 hours. Basic Metabolic Panel: Recent Labs  Lab 04/10/23 0306 04/11/23 0313 04/12/23 0259 04/13/23 0245 04/14/23 0357 04/15/23 0233 04/16/23 0314  NA 147*  141 142 142 143 142 141  K 3.7 3.2* 4.1 3.3* 3.5 3.6 3.5  CL 107 108 105 109 109 107 105  CO2 30 29 28 27 26 29 27   GLUCOSE 85 78 98 92 96 113* 80  BUN 20 16 19 19 19  24* 25*  CREATININE 1.97* 1.70* 1.61* 1.65* 1.56* 1.80* 1.76*  CALCIUM 8.0* 7.7* 8.1* 8.1* 8.6* 8.5* 8.4*  MG 2.2 2.1 2.1 2.1  --   --  2.2   Liver Function Tests: No results for input(s): "AST", "ALT", "ALKPHOS", "BILITOT", "PROT", "ALBUMIN" in the last 168 hours. CBG: Recent Labs  Lab 04/15/23 1130 04/15/23 1513 04/15/23 2104 04/16/23 0613 04/16/23 0723  GLUCAP 85 97 104* 74 115*    Discharge time spent: greater than 30 minutes.  Signed: Coralie Keens, MD [facility] 04/16/2023

## 2023-04-16 NOTE — Progress Notes (Signed)
 Orthopedic Tech Progress Note Patient Details:  Justin Molina. 25-Dec-1960 [id number]  Ortho Devices Type of Ortho Device: Ace wrap, Unna boot Ortho Device/Splint Location: RLE Ortho Device/Splint Interventions: Ordered, Application   Post Interventions Patient Tolerated: Well Instructions Provided: Care of device  [name] 04/16/2023, 8:21 AM

## 2023-04-16 NOTE — Progress Notes (Signed)
 Progress Note  Patient Name: Justin Molina. Date of Encounter: 04/16/2023  Primary Cardiologist: None   Subjective   Patient seen and examined.  Inpatient Medications    Scheduled Meds:  amLODipine  10 mg Oral Daily   apixaban  5 mg Oral BID   atorvastatin  80 mg Oral QHS   carvedilol  25 mg Oral BID WC   doxazosin  8 mg Oral QHS   empagliflozin  10 mg Oral Daily   furosemide  60 mg Intravenous Daily   hydrALAZINE  100 mg Oral TID   insulin aspart  0-9 Units Subcutaneous TID WC   isosorbide mononitrate  120 mg Oral Daily   polyethylene glycol  17 g Oral Daily   senna  1 tablet Oral QHS   spironolactone  50 mg Oral Daily   Continuous Infusions:  PRN Meds: acetaminophen **OR** acetaminophen, albuterol, polyvinyl alcohol   Vital Signs    Vitals:   04/15/23 2018 04/16/23 0017 04/16/23 0428 04/16/23 0747  BP:  (!) 154/96 (!) 147/88 138/74  Pulse: 72 70 77   Resp:  16 18 16   Temp:  97.8 F (36.6 C) 97.9 F (36.6 C) 97.6 F (36.4 C)  TempSrc:  Oral Oral Oral  SpO2:  100% 98% 98%  Weight:   124 kg   Height:        Intake/Output Summary (Last 24 hours) at 04/16/2023 0859 Last data filed at 04/16/2023 0745 Gross per 24 hour  Intake 956 ml  Output 1975 ml  Net -1019 ml   Filed Weights   04/14/23 0627 04/15/23 0525 04/16/23 0428  Weight: 121.7 kg 120.7 kg 124 kg    Telemetry    Atrial fibrillation - Personally Reviewed  ECG     - Personally Reviewed  Physical Exam    General: Comfortable Head: Atraumatic, normal size  Eyes: PEERLA, EOMI  Neck: Supple, normal JVD Cardiac: Normal S1, S2; RRR; no murmurs, rubs, or gallops Lungs: Clear to auscultation bilaterally Abd: Soft, nontender, no hepatomegaly  Ext: warm, no edema Musculoskeletal: No deformities, BUE and BLE strength normal and equal Skin: Warm and dry, no rashes   Neuro: Alert and oriented to person, place, time, and situation, CNII-XII grossly intact, no focal deficits  Psych: Normal  mood and affect   Labs    Chemistry Recent Labs  Lab 04/14/23 0357 04/15/23 0233 04/16/23 0314  NA 143 142 141  K 3.5 3.6 3.5  CL 109 107 105  CO2 26 29 27   GLUCOSE 96 113* 80  BUN 19 24* 25*  CREATININE 1.56* 1.80* 1.76*  CALCIUM 8.6* 8.5* 8.4*  GFRNONAA 50* 42* 43*  ANIONGAP 8 6 9      Hematology No results for input(s): "WBC", "RBC", "HGB", "HCT", "MCV", "MCH", "MCHC", "RDW", "PLT" in the last 168 hours.   Cardiac EnzymesNo results for input(s): "TROPONINI" in the last 168 hours. No results for input(s): "TROPIPOC" in the last 168 hours.   BNP No results for input(s): "BNP", "PROBNP" in the last 168 hours.    DDimer No results for input(s): "DDIMER" in the last 168 hours.   Radiology    No results found.  Cardiac Studies   Echo from 01/2023 reviewed   Patient Profile     63 y.o. male with a history of chronic HFpEF, PFO, atrial fibrillation, hypertension, hyperlipidemia, CVA who presented on March 4 with CHF exacerbation.   Assessment & Plan    Acute on Chronic heart failure with preserved ejection  fraction - clinically improved - will transition to oral diuretics today.    Hypertension- blood pressure has had some improvement will continue on current regimen:  amlodipine 10 mg daily, carvedilol 25 mg twice daily, hydralazine 100 mg 3 times daily, Imdur 120 mg daily, spironolactone 50 mg daily also on doxazosin 8 mg at bedtime.     Persistent atrial fibrillation-in A-fib today but is rate controlled.  Continue Coreg and Eliquis.  CKD - baseline is suspected to be between 1.5-1.6. Cr slightly improved 1.76 today    Hyperlipidemia -continue with current statin medication.  Can be discharged from a CV standpoint.   For questions or updates, please contact [facility] Please consult [email or web address] for contact info under Cardiology/STEMI.      Signed, Thomasene Ripple, DO  04/16/2023, 8:59 AM

## 2023-04-16 NOTE — Progress Notes (Addendum)
 Pt's RN Pam stated to call wife to discuss discharge. Wife, Justin Molina called and she states that she wants to be present when pt's instructions are reviewed with him. Pt has aphasia (expressive aphasia). Pt will remain in room and wait for his wife to arrive to discuss his instructions.  Darel Ricketts,RN SWOT

## 2023-04-16 NOTE — Progress Notes (Signed)
 Occupational Therapy Treatment Patient Details Name: Justin Molina. MRN: [id number] DOB: [birth year] Today's Date: 04/16/2023   History of present illness Pt is a 63 y.o. M presenting to ED on 3/3 with orthopnea, dyspnea, and BLE edema, admitted for acute on chronic HFpEF. Recent admission for CHF exacerbation 2/10-2/13-2025. PMH includes HFpEF, HTN, HLD, CKD IIIA, A fib, ICH with R hemiparesis, venous insufficiency, NIDT2DM, obesity.   OT comments  Patient received in supine and eager to participate. Patient able to get to EOB without assistance and use of bed rail. Patient able to donn right AFO and left shoe seated on EOB with supervision. Patient able to stand at sink and perform grooming tasks and stood at toilet to void with use of hemi walker. Patient asked to perform ambulation in hallway at end of session with CGA. Discharge recommendations continue to appropriate. Acute OT to continue to follow to address established goals to facilitate DC to next venue of care.       If plan is discharge home, recommend the following:  A little help with walking and/or transfers;A little help with bathing/dressing/bathroom;Assistance with cooking/housework;Assist for transportation;Assistance with feeding   Equipment Recommendations  None recommended by OT;Other (comment)    Recommendations for Other Services      Precautions / Restrictions Precautions Precautions: Fall Recall of Precautions/Restrictions: Intact Precaution/Restrictions Comments: R hemi Required Braces or Orthoses: Other Brace Other Brace: RLE AFO Restrictions Weight Bearing Restrictions Per Provider Order: No       Mobility Bed Mobility Overal bed mobility: Needs Assistance Bed Mobility: Supine to Sit     Supine to sit: Used rails, Supervision     General bed mobility comments: increased time, patient insisting doing it for self    Transfers Overall transfer level: Needs assistance Equipment used:  Hemi-walker Transfers: Sit to/from Stand, Bed to chair/wheelchair/BSC Sit to Stand: Contact guard assist           General transfer comment: patient with contact gaurd using hemi walker while transferring     Balance Overall balance assessment: Needs assistance Sitting-balance support: Single extremity supported, Feet supported Sitting balance-Leahy Scale: Fair Sitting balance - Comments: Pt sat EOB with supervision.   Standing balance support: Reliant on assistive device for balance, Single extremity supported, During functional activity Standing balance-Leahy Scale: Fair Standing balance comment: Pt depends on LUE support for transfers and gait using AD. No overt LOB.                           ADL either performed or assessed with clinical judgement   ADL Overall ADL's : Needs assistance/impaired     Grooming: Oral care;Wash/dry face;Wash/dry hands;Contact guard assist;Standing Grooming Details (indicate cue type and reason): standing at sink                 Toilet Transfer: Electronics engineer Details (indicate cue type and reason): with use of hemi walker     Tub/ Shower Transfer: Contact guard Chief Technology Officer Details (indicate cue type and reason): pt states having walk in shower at home Functional mobility during ADLs: Contact guard assist      Extremity/Trunk Assessment              Vision       Perception     Praxis     Communication Communication Communication: Impaired Factors Affecting Communication: Difficulty expressing self   Cognition Arousal: Alert Behavior During Therapy: [facility] for  tasks assessed/performed                                 Following commands: Intact        Cueing   Cueing Techniques: Verbal cues, Tactile cues, Visual cues  Exercises      Shoulder Instructions       General Comments      Pertinent Vitals/ Pain       Pain  Assessment Pain Assessment: No/denies pain  Home Living                                          Prior Functioning/Environment              Frequency  Min 2X/week        Progress Toward Goals  OT Goals(current goals can now be found in the care plan section)  Progress towards OT goals: Progressing toward goals  Acute Rehab OT Goals Patient Stated Goal: get better OT Goal Formulation: With patient Time For Goal Achievement: 04/23/23 Potential to Achieve Goals: Good ADL Goals Pt Will Perform Grooming: standing;with set-up;with supervision Pt Will Transfer to Toilet: ambulating;with supervision Pt Will Perform Toileting - Clothing Manipulation and hygiene: with modified independence;with adaptive equipment;sitting/lateral leans;sit to/from stand;with caregiver independent in assisting Pt Will Perform Tub/Shower Transfer: Tub transfer;Shower transfer;with modified independence;ambulating Additional ADL Goal #1: pt will identify x3 CHF/energy conservation strategies in prep for ADLs Additional ADL Goal #2: pt will tolerate OOB activity x15 min in order to improve activity tolerance for ADLs  Plan      Co-evaluation                 AM-PAC OT "6 Clicks" Daily Activity     Outcome Measure   Help from another person eating meals?: A Little Help from another person taking care of personal grooming?: A Little Help from another person toileting, which includes using toliet, bedpan, or urinal?: A Little Help from another person bathing (including washing, rinsing, drying)?: A Little Help from another person to put on and taking off regular upper body clothing?: A Little Help from another person to put on and taking off regular lower body clothing?: A Little 6 Click Score: 18    End of Session Equipment Utilized During Treatment: Gait belt;Other (comment) (hemi walker)  OT Visit Diagnosis: Hemiplegia and hemiparesis;Other (comment) Hemiplegia -  Right/Left: Right Hemiplegia - dominant/non-dominant: Dominant Hemiplegia - caused by: Nontraumatic intracerebral hemorrhage   Activity Tolerance Patient tolerated treatment well   Patient Left with call bell/phone within reach;in chair;with chair alarm set   Nurse Communication Mobility status        Time: 1610-9604 OT Time Calculation (min): 21 min  Charges: OT General Charges $OT Visit: 1 Visit OT Treatments $Self Care/Home Management : 8-22 mins  Alfonse Flavors, OTA Acute Rehabilitation Services  Office [phone]   Dewain Penning 04/16/2023, 12:25 PM

## 2023-04-16 NOTE — Care Management Important Message (Signed)
 Important Message  Patient Details  Name: Justin Molina. MRN: [id number] Date of Birth: [birth year]   Important Message Given:  Yes - Medicare IM     Renie Ora 04/16/2023, 10:20 AM

## 2023-04-16 NOTE — Plan of Care (Signed)
  Problem: Education: Goal: Knowledge of General Education information will improve Description: Including pain rating scale, medication(s)/side effects and non-pharmacologic comfort measures Outcome: Progressing   Problem: Clinical Measurements: Goal: Ability to maintain clinical measurements within normal limits will improve Outcome: Progressing   Problem: Coping: Goal: Level of anxiety will decrease Outcome: Progressing   Problem: Safety: Goal: Ability to remain free from injury will improve Outcome: Progressing   Problem: Skin Integrity: Goal: Risk for impaired skin integrity will decrease Outcome: Progressing

## 2023-04-16 NOTE — TOC Transition Note (Signed)
 Transition of Care [facility]) - Discharge Note   Patient Details  Name: Justin Molina. MRN: [id number] Date of Birth: [birth year]  Transition of Care [facility]) CM/SW Contact:  Tom-Johnson, Hershal Coria, RN Phone Number: 04/16/2023, 1:42 PM   Clinical Narrative:     Patient is scheduled for discharge today.  Readmission Risk Assessment done. Home health info, hospital f/u and discharge instructions on AVS. Prescriptions sent to [facility] pharmacy and patient will receive meds prior discharge. Wife, Archie Patten to transport at discharge.  No further TOC needs noted.       Final next level of care: Home w Home Health Services Barriers to Discharge: Barriers Resolved   Patient Goals and CMS Choice Patient states their goals for this hospitalization and ongoing recovery are:: To return home CMS Medicare.gov Compare Post Acute Care list provided to:: Patient Represenative (must comment) Choice offered to / list presented to : Spouse      Discharge Placement                Patient to be transferred to facility by: Wife Name of family member notified: Tonya    Discharge Plan and Services Additional resources added to the After Visit Summary for   In-house Referral: NA Discharge Planning Services: CM Consult Post Acute Care Choice: Home Health          DME Arranged: N/A DME Agency: NA       HH Arranged: RN, PT, OT HH Agency: [facility] Date HH Agency Contacted: 04/10/23 Time HH Agency Contacted: 1633 Representative spoke with at [facility] Agency: Haywood Lasso  Social Drivers of Health (SDOH) Interventions SDOH Screenings   Food Insecurity: No Food Insecurity (04/09/2023)  Housing: Low Risk  (04/09/2023)  Transportation Needs: No Transportation Needs (04/09/2023)  Utilities: Not At Risk (04/09/2023)  Tobacco Use: Low Risk  (04/09/2023)     Readmission Risk Interventions    04/10/2023    4:30 PM  Readmission Risk Prevention Plan  Transportation Screening Complete  PCP or Specialist  Appt within 3-5 Days Complete  HRI or Home Care Consult Complete  Palliative Care Screening Not Applicable  Medication Review (RN Care Manager) Complete

## 2023-04-29 ENCOUNTER — Ambulatory Visit: Attending: Physician Assistant | Admitting: Physician Assistant

## 2023-04-30 NOTE — Progress Notes (Signed)
 This encounter was created in error - please disregard.

## 2023-05-08 ENCOUNTER — Ambulatory Visit: Admitting: Physician Assistant

## 2023-05-14 NOTE — Progress Notes (Unsigned)
 Cardiology Office Note    Patient Name: Justin Molina. Date of Encounter: 05/15/2023  Primary Care Provider:  Theodis Shove, DO Primary Cardiologist:  None Primary Electrophysiologist: None   Past Medical History    Past Medical History:  Diagnosis Date   Chronic renal insufficiency    Diabetes mellitus without complication (HCC)    Hypertension    Stroke [facility])    + History of Present Illness  Justin Bubb. is a 63 y.o. 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  Justin Molina was seen seen on 04/08/23 for CHF exacerbation.  He has a history of previous left MCA stroke in 2009 and right hemispheric CVA 05/11/2019.  He has a history of PFO with right-to-left shunt that was evaluated currently medical managed.  He has been dealing with RLE and RUE weakness since prior stroke and has been treated for wounds to his RLE and cellulitis.  He is wheelchair-bound and uses assistive devices.  He was admitted 03/2023 for CHF exacerbation and discharged with Lasix with titration by PCP and no significant improvement in symptoms.  He presented with complaint of shortness of breath and worsening lower extremity edema.  Chest x-ray was completed showing right-sided pleural effusion and BNP was 746.  He was started on Lasix 60 mg with significant improvement to peripheral edema and shortness of breath.  He was noted to have elevated BP of 160 over 104 kg showed atrial fibs T wave changes and left axis deviation.  He was not started on spironolactone due to risk of hyperkalemia but was continued on Lasix 40 mg daily.Echocardiogram with preserved LV systolic function with EF 60 to 65%, mild LVH, RV systolic function preserved, no significant valvular disease. Trivial pericardial effusion. Mild LA dilatation.  He was continued on Eliquis and carvedilol  for rate control.  He was also continued on amlodipine, carvedilol, hydralazine, Imdur and doxazosin for BP.  Justin Molina presents today with his wife for posthospital follow-up with his wife.  He reports since his hospitalization that he has been doing well with no chest pain or shortness of breath.  His heart rate today was stable at 85 bpm and blood pressure was 137/88.  He has been compliant with his current medication regimen and denies any adverse reactions.  During today's visit we discussed the pathophysiology of congestive heart failure and also reviewed his medications in detail.  Today's visit his wife expressed concern regarding his current medication therapy for managing CHF.  During today's visit we reviewed each medication individually and discussed the mechanism of action and its significance to CHF.  He reports having home PT and OT as well as wound therapy for his right leg wound which is healing.  He is hoping to get strong enough to go to the [facility] with his wife to resume activities. Patient denies chest pain, palpitations, dyspnea, PND, orthopnea, nausea, vomiting, dizziness, syncope, edema, weight gain, or early satiety. History of Present Illness   Review of Systems  Please see the history of present illness.    All other systems reviewed and are otherwise negative except as noted above.  Physical Exam    Wt Readings from Last 3 Encounters:  04/16/23 273 lb 5.9 oz (124 kg)  03/21/23 262 lb 1.6 oz (118.9 kg)  02/27/23 299 lb (135.6 kg)   VS: Vitals:  05/15/23 1516  BP: 137/88  Pulse: 85  Resp: 14  Temp: (!) 88 F (31.1 C)  SpO2: 97%  ,Body mass index is 38.13 kg/m. GEN: Well nourished, well developed in no acute distress Neck: No JVD; No carotid bruits Pulmonary: Clear to auscultation without rales, wheezing or rhonchi  Cardiovascular: Normal rate. Regular rhythm. Normal S1. Normal S2.   Murmurs: There is no murmur.  ABDOMEN: Soft, non-tender,  non-distended EXTREMITIES:  No edema; No deformity   EKG/LABS/ Recent Cardiac Studies   ECG personally reviewed by me today -atrial fibrillation with left axis deviation and rate of 83 bpm with no acute changes noted.  Risk Assessment/Calculations:    CHA2DS2-VASc Score = 5   This indicates a 7.2% annual risk of stroke. The patient's score is based upon: CHF History: 1 HTN History: 1 Diabetes History: 1 Stroke History: 2 Vascular Disease History: 0 Age Score: 0 Gender Score: 0         Lab Results  Component Value Date   WBC 4.9 04/09/2023   HGB 10.8 (L) 04/09/2023   HCT 34.5 (L) 04/09/2023   MCV 81.2 04/09/2023   PLT 196 04/09/2023   Lab Results  Component Value Date   CREATININE 1.76 (H) 04/16/2023   BUN 25 (H) 04/16/2023   NA 141 04/16/2023   K 3.5 04/16/2023   CL 105 04/16/2023   CO2 27 04/16/2023   No results found for: "CHOL", "HDL", "LDLCALC", "LDLDIRECT", "TRIG", "CHOLHDL"  Lab Results  Component Value Date   HGBA1C 7.5 (H) 01/18/2023   Assessment & Plan    1.  Chronic diastolic CHF: -Patient recently admitted for CHF exacerbation and today is euvolemic with +1 lower extremity edema present -NYHA class II -Continue current GDMT with Lasix 40 mg and as needed 20 mg for weight gain greater than 2 pounds in 24 hours or 5 pounds in 1 week -Continue Jardiance 10 mg, hydralazine 100 mg 3 times daily, Imdur 120 mg, carvedilol 25 mg twice daily -We will check BMET and BNP today and plan to add spironolactone if potassium and renal function is stable -Repeat 2D echo  2.  Chronic AF: -Patient currently rate controlled with carvedilol 25 mg twice daily -Continue Eliquis 5 mg twice daily  3.  Hypertension: Patient's blood pressure is stable at 137/88 -Continue Norvasc 10 mg daily, carvedilol 25 mg twice daily, hydralazine 100 mg 3 times daily, Imdur 120 mg daily  4.  History of CVA: -s/p  left MCA stroke in 2009 and right hemispheric CVA 05/11/2019 with  right-sided residual weakness -Continue current GDMT with Lipitor 80 mg and currently not on ASA 81 mg due to Eliquis  5.  DM type II: -Patient's hemoglobin A1c was 7.5 -Continue Jardiance 10 mg daily  6.  CKD stage III: -We will check BMET today -Ambulatory referral to nephrology  Disposition: Follow-up with None or APP in 3 months    Signed, Napoleon Form, Leodis Rains, NP 05/15/2023, 3:59 PM [facility]

## 2023-05-15 ENCOUNTER — Encounter: Payer: Self-pay | Admitting: Nurse Practitioner

## 2023-05-15 ENCOUNTER — Ambulatory Visit: Attending: Nurse Practitioner | Admitting: Nurse Practitioner

## 2023-05-15 ENCOUNTER — Ambulatory Visit: Admitting: Internal Medicine

## 2023-05-15 VITALS — BP 137/88 | HR 85 | Temp 98.0°F | Resp 14 | Ht 71.0 in | Wt 274.0 lb

## 2023-05-15 DIAGNOSIS — E1169 Type 2 diabetes mellitus with other specified complication: Secondary | ICD-10-CM

## 2023-05-15 DIAGNOSIS — I482 Chronic atrial fibrillation, unspecified: Secondary | ICD-10-CM | POA: Diagnosis not present

## 2023-05-15 DIAGNOSIS — I5032 Chronic diastolic (congestive) heart failure: Secondary | ICD-10-CM | POA: Diagnosis not present

## 2023-05-15 DIAGNOSIS — I6930 Unspecified sequelae of cerebral infarction: Secondary | ICD-10-CM

## 2023-05-15 DIAGNOSIS — N183 Chronic kidney disease, stage 3 unspecified: Secondary | ICD-10-CM | POA: Diagnosis not present

## 2023-05-15 DIAGNOSIS — E785 Hyperlipidemia, unspecified: Secondary | ICD-10-CM

## 2023-05-15 MED ORDER — FUROSEMIDE 40 MG PO TABS: 40.0000 mg | ORAL_TABLET | Freq: Every day | ORAL | 0 refills | Status: DC

## 2023-05-15 NOTE — Patient Instructions (Signed)
 Medication Instructions:  CAN TAKE AN ADDITIONAL HALF TABLET OF LASIX AS NEEDED *If you need a refill on your cardiac medications before your next appointment, please call your pharmacy*  Lab Work: TODAY-BMET & BNP If you have labs (blood work) drawn today and your tests are completely normal, you will receive your results only by: MyChart Message (if you have MyChart) OR A paper copy in the mail If you have any lab test that is abnormal or we need to change your treatment, we will call you to review the results.  Testing/Procedures: Your physician has requested that you have an echocardiogram. Echocardiography is a painless test that uses sound waves to create images of your heart. It provides your doctor with information about the size and shape of your heart and how well your heart's chambers and valves are working. This procedure takes approximately one hour. There are no restrictions for this procedure. Please do NOT wear cologne, perfume, aftershave, or lotions (deodorant is allowed). Please arrive 15 minutes prior to your appointment time.  Please note: We ask at that you not bring children with you during ultrasound (echo/ vascular) testing. Due to room size and safety concerns, children are not allowed in the ultrasound rooms during exams. Our front office staff cannot provide observation of children in our lobby area while testing is being conducted. An adult accompanying a patient to their appointment will only be allowed in the ultrasound room at the discretion of the ultrasound technician under special circumstances. We apologize for any inconvenience.  Follow-Up: At [facility], you and your health needs are our priority.  As part of our continuing mission to provide you with exceptional heart care, our providers are all part of one team.  This team includes your primary Cardiologist (physician) and Advanced Practice Providers or APPs (Physician Assistants and Nurse  Practitioners) who all work together to provide you with the care you need, when you need it.  Your next appointment:   3 month(s)  Provider:   Robin Searing, NP     Then, Nathaniel Man, MD will plan to see you again in 6 month(s).    We recommend signing up for the patient portal called "MyChart".  Sign up information is provided on this After Visit Summary.  MyChart is used to connect with patients for Virtual Visits (Telemedicine).  Patients are able to view lab/test results, encounter notes, upcoming appointments, etc.  Non-urgent messages can be sent to your provider as well.   To learn more about what you can do with MyChart, go to [email or web address].   Other Instructions       1st Floor: - Lobby - Registration  - Pharmacy  - Lab - Cafe  2nd Floor: - PV Lab - Diagnostic Testing (echo, CT, nuclear med)  3rd Floor: - Vacant  4th Floor: - TCTS (cardiothoracic surgery) - AFib Clinic - Structural Heart Clinic - Vascular Surgery  - Vascular Ultrasound  5th Floor: - HeartCare Cardiology (general and EP) - Clinical Pharmacy for coumadin, hypertension, lipid, weight-loss medications, and med management appointments    Valet parking services will be available as well.

## 2023-05-16 LAB — BASIC METABOLIC PANEL WITH GFR
BUN/Creatinine Ratio: 14 (ref 10–24)
BUN: 29 mg/dL — ABNORMAL HIGH (ref 8–27)
CO2: 24 mmol/L (ref 20–29)
Calcium: 9.1 mg/dL (ref 8.6–10.2)
Chloride: 103 mmol/L (ref 96–106)
Creatinine, Ser: 2.03 mg/dL — ABNORMAL HIGH (ref 0.76–1.27)
Glucose: 81 mg/dL (ref 70–99)
Potassium: 4.4 mmol/L (ref 3.5–5.2)
Sodium: 144 mmol/L (ref 134–144)
eGFR: 36 mL/min/{1.73_m2} — ABNORMAL LOW (ref >59)

## 2023-05-16 LAB — PRO B NATRIURETIC PEPTIDE: NT-Pro BNP: 3546 pg/mL — ABNORMAL HIGH (ref 0–210)

## 2023-05-27 ENCOUNTER — Other Ambulatory Visit: Payer: Self-pay

## 2023-05-27 DIAGNOSIS — I5032 Chronic diastolic (congestive) heart failure: Secondary | ICD-10-CM

## 2023-05-28 ENCOUNTER — Ambulatory Visit: Admitting: Student

## 2023-06-14 ENCOUNTER — Encounter: Admitting: Cardiology

## 2023-06-17 ENCOUNTER — Encounter: Admitting: Cardiology

## 2023-06-18 ENCOUNTER — Ambulatory Visit (HOSPITAL_COMMUNITY): Attending: Cardiovascular Disease

## 2023-06-24 ENCOUNTER — Other Ambulatory Visit (HOSPITAL_BASED_OUTPATIENT_CLINIC_OR_DEPARTMENT_OTHER)

## 2023-07-05 ENCOUNTER — Telehealth: Payer: Self-pay | Admitting: Nurse Practitioner

## 2023-07-05 DIAGNOSIS — Z79899 Other long term (current) drug therapy: Secondary | ICD-10-CM

## 2023-07-05 MED ORDER — POTASSIUM CHLORIDE CRYS ER 20 MEQ PO TBCR: 20.0000 meq | EXTENDED_RELEASE_TABLET | Freq: Every day | ORAL | 3 refills | Status: DC

## 2023-07-05 NOTE — Telephone Encounter (Signed)
 Spoke to wife and she is aware to increase lasix  to 80 mg for the next 3 days. He will take potassium  20 meq until he see Dr. Alease Amend. Continue to weigh daily abstain from excess salt in your diet   Potassium sent to pharmacy. She verbalized understanding

## 2023-07-05 NOTE — Telephone Encounter (Signed)
 Pt c/o swelling: STAT is pt has developed SOB within 24 hours  How much weight have you gained and in what time span?  About 10 lbs within the past 3 months  If swelling, where is the swelling located?  Ankles, mainly the right  Are you currently taking a fluid pill?  Yes, furosemide  (LASIX ) 40 MG tablet. 40 MG during the day + 20 MG at night  Are you currently SOB?  Not currently, but does occur when bending over.   Do you have a log of your daily weights (if so, list)?  No log available   Have you gained 3 pounds in a day or 5 pounds in a week?   Have you traveled recently?  3 weeks ago they went to a funeral in [place] . The flight was only 1.5 hours though.

## 2023-07-05 NOTE — Telephone Encounter (Signed)
 Spoke with wife per DPR and she states patient has gained 10 lbs since leaving the hospital. He has swelling in both feet and ankles. Some SOB. Denies any chest pain, headache, nausea or vomiting. She states she was advised to let provider know if he was gaining weight.   Reviewed weights in the chart Today's weight is 276. Last OV 4/9 weight 274 March 11 his weight 272.8  He is taking lasix  as prescribed.   Informed wife amlodipine  can cause swelling in his feet and ankles. Did advise wife to monitor weight daily.  If he gains 3lbs over night or 5lbs in aa week to give us  a call. Try elevating his legs and feet. She can also try compression stockings.  ED precautions discussed. Will forward to provider

## 2023-07-08 NOTE — Progress Notes (Signed)
   ADVANCED HEART FAILURE NEW PATIENT CLINIC NOTE  Referring Physician: Augustus Blood, *  Primary Care: Kelleen Patee Jannett Mems, DO Primary Cardiologist:  HPI: Justin Molina. is a 63 y.o. male with a PMH of HFpEF, pAF, PFO, CVA x2, resistant HTN who presents for initial visit for further evaluation and treatment of heart failure/cardiomyopathy.      Patient with a long standing history of resistant hypertension. He has several complications from this including multiple CVAs with residual deficits, CKD, HFpEF. He has recently been admitted multiple times for acute on chronic diastolic heart failure and was referred for further management.      SUBJECTIVE: Patient with some difficulty with word finding, able to answer simple questions, but majority of history obtained from wife at bedside. She reports that recently he has been gaining weight and is more swollen than previously. They increased his lasix  to 80mg  daily but there has been minimal improvement. He does note some appeitite suppression as well as orthopnea and PND. She reports that his blood  pressure at home is much better controlled, usually in the 130s/140s.   PMH, current medications, allergies, social history, and family history reviewed in epic.  PHYSICAL EXAM: Vitals:   07/10/23 1532  BP: (!) 172/94  Pulse: 95  SpO2: 95%   GENERAL: obese, no acute distress PULM:  Normal work of breathing, clear to auscultation bilaterally. Respirations are unlabored.  CARDIAC:  JVP: elevated to the mid neck at 90 degrees with grossly positive HJR         Irregular rate and rhythm, systolic murmur ABDOMEN: moderate distension NEUROLOGIC: Patient is oriented x3   DATA REVIEW  ECG: 07/10/23: afib, normal QRS    ECHO: 01/19/23: LVEF 60-65%, mild LVH, normal RV systolic function, RV size  CATH: None    Heart failure review: - Classification: Heart failure with preserved EF - Etiology: HTN related - NYHA Class:III   - Volume status: Hypervolemic    ASSESSMENT & PLAN:  Acute on chronic diastolic heart failure: Significantly volume overloaded, blood pressure elevated here but reports control at home. Disease almost certainly due to combination of resistant hypertension and now worsening renal disease. - Change lasix  to torsemide 60mg  BID for the next two weeks - Follow up in APP clinic - Continue hydralazine  100mg  TID, isosorbide  120mg  daily, carvedilol  25mg  BID, amlodipine  5mg  daily, cardura  4mg  daily - Increase Kcl to 20mEq BID - If renal function stable at follow up would start spironolactone  - Continue jardiance  10mg  daily - Not an advanced therapies candidate. After successful decongestion can likely be followed by general cardiology  CKD:  - Stage IIIa, related to HTN and multiple recent admissions - Will eventually need nephrology referral  CVA:  - related to known HTN - Continue apixaban  5mg  BID, atorvastatin  80mg  daily  Afib: Permanent  - Continue carvedilol  and apixaban   HTN: Reports better control at home, but elevated today. - Management and diuresis as above - Would likely benefit from addition of entresto if renal function allows  Follow up in 2 weeks, high risk of admission.   Arta Lark, MD Advanced Heart Failure Mechanical Circulatory Support 07/11/23

## 2023-07-10 ENCOUNTER — Ambulatory Visit (HOSPITAL_COMMUNITY)
Admission: RE | Admit: 2023-07-10 | Discharge: 2023-07-10 | Disposition: A | Discharge status: Home / Self Care | Source: Ambulatory Visit | Attending: Cardiology | Admitting: Cardiology

## 2023-07-10 VITALS — BP 172/94 | HR 95 | Wt 286.2 lb

## 2023-07-10 DIAGNOSIS — I1A0 Resistant hypertension: Secondary | ICD-10-CM | POA: Insufficient documentation

## 2023-07-10 DIAGNOSIS — Z7901 Long term (current) use of anticoagulants: Secondary | ICD-10-CM | POA: Diagnosis not present

## 2023-07-10 DIAGNOSIS — Z79899 Other long term (current) drug therapy: Secondary | ICD-10-CM | POA: Diagnosis not present

## 2023-07-10 DIAGNOSIS — Z8673 Personal history of transient ischemic attack (TIA), and cerebral infarction without residual deficits: Secondary | ICD-10-CM | POA: Diagnosis not present

## 2023-07-10 DIAGNOSIS — I4821 Permanent atrial fibrillation: Secondary | ICD-10-CM | POA: Insufficient documentation

## 2023-07-10 DIAGNOSIS — N1831 Chronic kidney disease, stage 3a: Secondary | ICD-10-CM | POA: Insufficient documentation

## 2023-07-10 DIAGNOSIS — I5032 Chronic diastolic (congestive) heart failure: Secondary | ICD-10-CM | POA: Insufficient documentation

## 2023-07-10 DIAGNOSIS — Z7984 Long term (current) use of oral hypoglycemic drugs: Secondary | ICD-10-CM | POA: Diagnosis not present

## 2023-07-10 DIAGNOSIS — I130 Hypertensive heart and chronic kidney disease with heart failure and stage 1 through stage 4 chronic kidney disease, or unspecified chronic kidney disease: Secondary | ICD-10-CM | POA: Diagnosis not present

## 2023-07-10 MED ORDER — POTASSIUM CHLORIDE CRYS ER 20 MEQ PO TBCR: 20.0000 meq | EXTENDED_RELEASE_TABLET | Freq: Two times a day (BID) | ORAL | 3 refills | Status: DC

## 2023-07-10 MED ORDER — TORSEMIDE 20 MG PO TABS: 60.0000 mg | ORAL_TABLET | Freq: Two times a day (BID) | ORAL | 3 refills | Status: DC

## 2023-07-10 NOTE — Patient Instructions (Signed)
 START Torsemide 60 mg ( 3 Tabs) Twice daily  START Potassium 20 mEq ( 1 Tab) Twice daily  Your physician recommends that you schedule a follow-up appointment as scheduled   If you have any questions or concerns before your next appointment please send us  a message through [place] or call our office at [phone].    TO LEAVE A MESSAGE FOR THE NURSE SELECT OPTION 2, PLEASE LEAVE A MESSAGE INCLUDING: YOUR NAME DATE OF BIRTH CALL BACK NUMBER REASON FOR CALL**this is important as we prioritize the call backs  YOU WILL RECEIVE A CALL BACK THE SAME DAY AS LONG AS YOU CALL BEFORE 4:00 PM  At the [facility], you and your health needs are our priority. As part of our continuing mission to provide you with exceptional heart care, we have created designated Provider Care Teams. These Care Teams include your primary Cardiologist (physician) and Advanced Practice Providers (APPs- Physician Assistants and Nurse Practitioners) who all work together to provide you with the care you need, when you need it.   You may see any of the following providers on your designated Care Team at your next follow up: Dr Jules Oar Dr Peder Bourdon Dr. Alwin Baars Dr. Arta Lark Amy Marijane Shoulders, NP Ruddy Corral, [place] [facility] [place], [place] Dennise Fitz, NP [place] Lee, NP Shawnee Dellen, NP Luster Salters, PharmD Bevely Brush, PharmD   Please be sure to bring in all your medications bottles to every appointment.    Thank you for choosing [facility]

## 2023-07-22 ENCOUNTER — Ambulatory Visit (INDEPENDENT_AMBULATORY_CARE_PROVIDER_SITE_OTHER): Admitting: Podiatry

## 2023-07-22 ENCOUNTER — Encounter: Payer: Self-pay | Admitting: Podiatry

## 2023-07-22 DIAGNOSIS — B351 Tinea unguium: Secondary | ICD-10-CM

## 2023-07-22 DIAGNOSIS — M79675 Pain in left toe(s): Secondary | ICD-10-CM

## 2023-07-22 DIAGNOSIS — M79674 Pain in right toe(s): Secondary | ICD-10-CM

## 2023-07-22 DIAGNOSIS — E1140 Type 2 diabetes mellitus with diabetic neuropathy, unspecified: Secondary | ICD-10-CM | POA: Diagnosis not present

## 2023-07-22 DIAGNOSIS — Z794 Long term (current) use of insulin: Secondary | ICD-10-CM

## 2023-07-22 NOTE — Progress Notes (Signed)
This patient returns to my office for at risk foot care.  This patient requires this care by a professional since this patient will be at risk due to having diabetes.He presents to the office with his wife of 38 years.  This patient is unable to cut nails himself since the patient cannot reach his nails.These nails are painful walking and wearing shoes.  This patient presents for at risk foot care today.  General Appearance  Alert, conversant and in no acute stress.  Vascular  Dorsalis pedis and posterior tibial  pulses are palpable  bilaterally.  Capillary return is within normal limits  bilaterally. Temperature is within normal limits  bilaterally.  Neurologic  Senn-Weinstein monofilament wire test  diminished bilaterally. Muscle power within normal limits bilaterally.  Nails Thick disfigured discolored nails with subungual debris  from hallux to fifth toes bilaterally. No evidence of bacterial infection or drainage bilaterally.  Orthopedic  No limitations of motion  feet .  No crepitus or effusions noted.  No bony pathology or digital deformities noted. Foot drop.  Skin  normotropic skin with no porokeratosis noted bilaterally.  No signs of infections or ulcers noted.     Onychomycosis  Pain in right toes  Pain in left toes  Foot drop.  Consent was obtained for treatment procedures.   Mechanical debridement of nails 1-5  bilaterally performed with a nail nipper.  Filed with dremel without incident. Patient requests and has been given a prescription of diabetic shoes with AFO   Return office visit     3 months                 Told patient to return for periodic foot care and evaluation due to potential at risk complications.   Helane Gunther DPM

## 2023-07-23 ENCOUNTER — Other Ambulatory Visit (HOSPITAL_BASED_OUTPATIENT_CLINIC_OR_DEPARTMENT_OTHER)

## 2023-07-29 NOTE — Progress Notes (Incomplete)
   ADVANCED HEART FAILURE NEW PATIENT CLINIC NOTE  Referring Physician: Judyth Isaiah Bottcher, *  Primary Care: Judyth Isaiah Bottcher, DO Primary Cardiologist:  HPI: Justin Molina. is a 63 y.o. male with a PMH of HFpEF, pAF, PFO, CVA x2, resistant HTN who presents for initial visit for further evaluation and treatment of heart failure/cardiomyopathy.      Patient with a long standing history of resistant hypertension. He has several complications from this including multiple CVAs with residual deficits, CKD, HFpEF. He has recently been admitted multiple times for acute on chronic diastolic heart failure and was referred for further management.      SUBJECTIVE: Patient with some difficulty with word finding, able to answer simple questions, but majority of history obtained from wife at bedside. She reports that recently he has been gaining weight and is more swollen than previously. They increased his lasix  to 80mg  daily but there has been minimal improvement. He does note some appeitite suppression as well as orthopnea and PND. She reports that his blood  pressure at home is much better controlled, usually in the 130s/140s.   PMH, current medications, allergies, social history, and family history reviewed in epic.  PHYSICAL EXAM: There were no vitals filed for this visit.  GENERAL: obese, no acute distress PULM:  Normal work of breathing, clear to auscultation bilaterally. Respirations are unlabored.  CARDIAC:  JVP: elevated to the mid neck at 90 degrees with grossly positive HJR         Irregular rate and rhythm, systolic murmur ABDOMEN: moderate distension NEUROLOGIC: Patient is oriented x3   DATA REVIEW  ECG: 07/10/23: afib, normal QRS    ECHO: 01/19/23: LVEF 60-65%, mild LVH, normal RV systolic function, RV size  CATH: None    Heart failure review: - Classification: Heart failure with preserved EF - Etiology: HTN related - NYHA Class:III  - Volume status:  Hypervolemic    ASSESSMENT & PLAN:  Acute on chronic diastolic heart failure: Significantly volume overloaded, blood pressure elevated here but reports control at home. Disease almost certainly due to combination of resistant hypertension and now worsening renal disease. - Change lasix  to torsemide  60mg  BID for the next two weeks - Follow up in APP clinic - Continue hydralazine  100mg  TID, isosorbide  120mg  daily, carvedilol  25mg  BID, amlodipine  5mg  daily, cardura  4mg  daily - Increase Kcl to 20mEq BID - If renal function stable at follow up would start spironolactone  - Continue jardiance  10mg  daily - Not an advanced therapies candidate. After successful decongestion can likely be followed by general cardiology  CKD:  - Stage IIIa, related to HTN and multiple recent admissions - Will eventually need nephrology referral  CVA:  - related to known HTN - Continue apixaban  5mg  BID, atorvastatin  80mg  daily  Afib: Permanent  - Continue carvedilol  and apixaban   HTN: Reports better control at home, but elevated today. - Management and diuresis as above - Would likely benefit from addition of entresto if renal function allows  Follow up in 2 weeks, high risk of admission.   Morene Brownie, MD Advanced [facility] Failure Mechanical Circulatory Support 07/29/23

## 2023-07-30 ENCOUNTER — Other Ambulatory Visit: Payer: Self-pay | Admitting: Nurse Practitioner

## 2023-07-30 ENCOUNTER — Encounter (HOSPITAL_COMMUNITY)

## 2023-07-30 DIAGNOSIS — I5032 Chronic diastolic (congestive) heart failure: Secondary | ICD-10-CM

## 2023-07-30 DIAGNOSIS — N183 Chronic kidney disease, stage 3 unspecified: Secondary | ICD-10-CM

## 2023-07-30 DIAGNOSIS — E1169 Type 2 diabetes mellitus with other specified complication: Secondary | ICD-10-CM

## 2023-07-30 DIAGNOSIS — I6930 Unspecified sequelae of cerebral infarction: Secondary | ICD-10-CM

## 2023-07-30 DIAGNOSIS — I482 Chronic atrial fibrillation, unspecified: Secondary | ICD-10-CM

## 2023-08-06 ENCOUNTER — Ambulatory Visit (HOSPITAL_BASED_OUTPATIENT_CLINIC_OR_DEPARTMENT_OTHER)
Admission: RE | Admit: 2023-08-06 | Discharge: 2023-08-06 | Disposition: A | Discharge status: Home / Self Care | Source: Ambulatory Visit | Attending: Nurse Practitioner | Admitting: Nurse Practitioner

## 2023-08-06 DIAGNOSIS — I5032 Chronic diastolic (congestive) heart failure: Secondary | ICD-10-CM | POA: Diagnosis present

## 2023-08-06 DIAGNOSIS — I482 Chronic atrial fibrillation, unspecified: Secondary | ICD-10-CM | POA: Diagnosis present

## 2023-08-07 ENCOUNTER — Ambulatory Visit: Payer: Self-pay | Admitting: Nurse Practitioner

## 2023-08-07 LAB — ECHOCARDIOGRAM COMPLETE
AR max vel: 3.04 cm2
AV Area VTI: 3.17 cm2
AV Area mean vel: 2.88 cm2
AV Mean grad: 2.5 mmHg
AV Peak grad: 3.8 mmHg
Ao pk vel: 0.97 m/s
Area-P 1/2: 5.35 cm2
Calc EF: 40.3 %
S' Lateral: 4.1 cm
Single Plane A2C EF: 45.1 %
Single Plane A4C EF: 45.1 %

## 2023-08-19 NOTE — Progress Notes (Signed)
 Dressing applied per provider orders. Patient tolerated procedure well. Patient given AVS.

## 2023-08-19 NOTE — Progress Notes (Signed)
 [facility]  Referred by: Bennetta Fonda Ade,*  Chief Complaint: Wound  PMH Includes: - stroke with R sided hemiparesis - DM2 - CHF - OSA  Initial History 03/18/23:  Here for evaluation of RLE ulcerations. The ulcerations have been present for 2 months. He has a history of ulcerations in 2018-2019 that took years to heal. He reports chronic edema in his legs since his stroke in 2009. His stroke primarily impacts his RLE and RUE. He denies any pain in the legs. He is able to walk with an assistive device.   Wife, Bascom, notes he is very SOB today which is unusual. He is also having more swelling in LE than usually.   Admitted 12/12 - 01/25/23 due to wound on RLE. Wound rapidly grown. Received 7 days of IV antibiotics. Transitioned to linezolid .   Relevant work up/results: CMP With Cr 1.95 01/25/23 A1c 7.5 01/18/23 CBC with Hgb 11.3 01/23/23  Interval history/progress: Initial visit - see above  03/25/23 After last visit, I sent him to the ED.  Admitted 12/10- 01/18/24 due to volume overload. He received IV lasix  and lost about 20 lbs. His breathing improved. He is happy with the progress. Reviewed discharge summary. His wrap was removed in the ED. He had some aqucaell in place today. He denies f/c/n/v.  Labs notable for Hgb 12.5 CMP with Cr 1.59 (improved from 2.o at admission) albumin 3.2  Echo:     05/13/23 Here for follow up. Last seen two months ago. Since last seen, he was admitted at [facility].  Admitted 3/3 - 04/16/23 due to HF exacerbation. He had increased edema and dyspnea. He required diuretics. He was diuresed. EF was 60-65%. He was net negative 14L with improvement in symptoms.  CBC with Hgb 10.8 04/09/23 BMP with Cr 1.76 albumin 3.0 at admission  He reports doing well. His wife has been wrapping his leg. She notes progress.   05/20/23 Wound improving. No concerns.   05/27/23 Wound is improving. Smaller. No concerns.   07-08-23 Last  seen 2 months ago Wound was healed. Recently last two weeks, started draining again and crusting over. He has gained ~ 15 lbs of fluid. He is weighing daily. No SOB.  No fevers or chills.   07-15-23 Wound is doing well.  His weight is down about 5-7 lbs Homehealth was unable to come out.  07/22/23  Returns with wounds healed   08-19-23 Wound with small scab present. Doing well. Not wearing compression today., endorses using it at home.   Vitals:   08/19/23 1411  BP: 149/89  Pulse:   Temp:        Exam: Pt is AA in NAD. Oriented x 3 Wound(s) exam: Right leg: Small scab. Lifted easily. Small granular ulcer. No infection.   ABI 1.15 in clinic  Wound 03/18/23 Venous Ulcer Pretibial Right (Active)  Date First Assessed/Time First Assessed: 03/18/23 1319   Pre-Existing Wound: Yes  Primary Wound Type: Venous Ulcer  Location: Pretibial  Wound Location Orientation: Right    Assessments 03/18/2023  1:21 PM 08/19/2023  2:07 PM  Wound Image      Site Assessment Red Dry  Peri-Wound Assessment Clean Clean  Drainage Amount Moderate None  Drainage Description Serosanguineous --  Wound Odor None None  Treatments Cleansed --  Wound Length (cm) 3 cm 0.1 cm  Wound Width (cm) 7 cm 0.1 cm  Wound Surface Area (cm^2) 21 cm^2 0.01 cm^2  Wound Depth (cm) 0.1 cm  0.1 cm  Wound Volume (cm^3) 2.1 cm^3 0.001 cm^3  Wound Healing % -- 100  Non-staged Wound Description Full thickness --     Active Orders  Date Order Priority Status Authorizing Provider  08/19/23 1624 Wound Care Venous Ulcer Right Pretibial Routine Active Fonda Charlie Morita, MD    - Wound cleansing::    Keep dry    - Dressing change frequency::    Do not change dressing for an entire week    - Primary wound dressing::    Other (Specify) (xeroform)    - Secondary wound dressing::    Other (Specify) (dry dressing)    - Edema control::    Unna boot to right lower extremity    - Additional Orders/Instructions::    Follow nutritous  diet    - Follow Up Appointment::    Return to clinic in 1 week  07/22/23 1412 Wound Care Venous Ulcer Right Pretibial Routine Active Gwendlyn Kimball Norfolk, MD    - Wound cleansing::    Other (Specify) (soap and water)    - Dressing change frequency::    Change dressing every day    - Primary wound dressing::    Other (Specify) (Xeroform)    - Secondary wound dressing::    Other (Specify) (dry dressing)    - Additional Orders/Instructions::    Follow nutritous diet    - Follow Up Appointment::    Return to clinic in 2 weeks  07/15/23 1509 Wound Care Venous Ulcer Right Pretibial Routine Active Fonda Charlie Morita, MD    - Wound cleansing::    Keep dry    - Dressing change frequency::    Do not change dressing for an entire week    - Primary wound dressing::    Other (Specify) (Silver alginate)    - Secondary wound dressing::    ABD pad    - Edema control::    Unna boot to right lower extremity    - Additional Orders/Instructions::    Follow nutritous diet    - Follow Up Appointment::    Return to clinic in 1 week  07/08/23 1448 Wound Care Venous Ulcer Right Pretibial Routine Active Fonda Charlie Morita, MD    - Wound cleansing::    Cleanse wound with wound cleanser    - Dressing change frequency::    Change dressing twice a week    - Primary wound dressing::    Calcium  alginate with silver    - Secondary wound dressing::    ABD pad    - Edema control::    Unna boot to right lower extremity    - Additional Orders/Instructions::    Follow nutritous diet    - Follow Up Appointment::    Return to clinic in 1 week  05/27/23 1646 Wound Care Venous Ulcer Right Pretibial Routine Active Fonda Charlie Morita, MD    - Wound cleansing::    Keep dry    - Dressing change frequency::    Do not change dressing for an entire week    - Primary wound dressing::    Other (Specify) (Silver Alginate)    - Secondary wound dressing::    Other (Specify) (dry dressing)    - Edema control::    Unna boot to  right lower extremity    - Additional Orders/Instructions::    Follow nutritous diet    - Follow Up Appointment::    Return to clinic in 1 week  05/20/23 1647 Wound Care Venous Ulcer Right Pretibial  Routine Active Fonda Charlie Morita, MD    - Wound cleansing::    Keep dry    - Dressing change frequency::    Do not change dressing for an entire week    - Primary wound dressing::    Other (Specify) (Silver Alginate)    - Secondary wound dressing::    Other (Specify) (dry dressing)    - Edema control::    Unna boot to right lower extremity    - Additional Orders/Instructions::    Follow nutritous diet    - Follow Up Appointment::    Return to clinic in 1 week  05/13/23 1712 Wound Care Venous Ulcer Right Pretibial Routine Active Fonda Charlie Morita, MD    - Wound cleansing::    Keep dry    - Dressing change frequency::    Change dressing twice a week    - Primary wound dressing::    Other (Specify) (Silver Alginate)    - Secondary wound dressing::    Other (Specify) (dry dressing)    - Edema control::    Unna boot to right lower extremity    - Additional Orders/Instructions::    Follow nutritous diet    - Follow Up Appointment::    Return to clinic in 1 week  03/25/23 1631 Wound Care Venous Ulcer Right Pretibial Routine Active Fonda Charlie Morita, MD    - Wound cleansing::    Keep dry    - Dressing change frequency::    Do not change dressing for an entire week    - Primary wound dressing::    Other (Specify) (Silver Alginate)    - Secondary wound dressing::    Other (Specify) (dry dressing)    - Edema control::    Unna boot to right lower extremity    - Additional Orders/Instructions::    Follow nutritous diet    - Follow Up Appointment::    Return to clinic in 1 week  03/18/23 1708 Wound Care Venous Ulcer Right Pretibial Routine Active Fonda Charlie Morita, MD    - Wound cleansing::    Keep dry    - Dressing change frequency::    Do not change dressing for an entire week    -  Primary wound dressing::    Other (Specify) (Silver Alginate)    - Secondary wound dressing::    Other (Specify) (dry dressing)    - Edema control::    Unna boot to right lower extremity    - Additional Orders/Instructions::    Follow nutritous diet    - Follow Up Appointment::    Return to clinic in 1 week     Inactive Orders  Date Order Priority Status Authorizing Provider  07/08/23 1442 Debridement Venous Ulcer Right Pretibial Routine Completed Fonda Charlie Morita, MD  05/13/23 1617 Debridement Venous Ulcer Right Pretibial Routine Completed Fonda Charlie Morita, MD  03/25/23 1434 Debridement Venous Ulcer Right Pretibial Routine Completed Fonda Charlie Morita, MD  03/18/23 1407 Debridement Venous Ulcer Right Pretibial Routine Completed Fonda Charlie Morita, MD    Assessment Encounter Diagnoses  Name Primary?  . Non-pressure chronic ulcer of other part of right lower leg with fat layer exposed    (CMD) Yes  . Venous insufficiency   . Lymphedema      Ulcer RLE with recurrence in setting of volume overload. His weight has remained down. Small spot still open. We will restart compression wrap. Measured for circaids again today. Gave # to call for [employer]  as his insurance will not  cover supplies   PLAN: Xeroform, unna boot Elevate leg Check weight Daily Check weights daily Continue torsemide  per cardiology Elevate legs  F/u 1 week sooner if needed    Orders Placed This Encounter  Procedures  . Wound Care Venous Ulcer Right Pretibial    Right Leg: Apply Xeroform, dry dressing, unna boot. Change weekly    Wound cleansing::   Keep dry    Dressing change frequency::   Do not change dressing for an entire week    Primary wound dressing::   Other (Specify)             xeroform    Secondary wound dressing::   Other (Specify)             dry dressing    Edema control::   Unna boot to right lower extremity    Additional Orders/Instructions::   Follow nutritous diet    Follow  Up Appointment::   Return to clinic in 1 week    Carlester Kasparek 08/19/23   Past Medical History:  Diagnosis Date  . Anxiety   . Aphasia   . Atrial fibrillation    (CMD)   . CKD (chronic kidney disease)   . Constipation   . Coronary arteriosclerosis   . Diabetes mellitus    (CMD)   . Heart failure with preserved ejection fraction    (CMD)   . Hyperlipidemia   . Hypertension   . Obesity   . OSA on CPAP   . Retinopathy   . Stroke    (CMD)   . Wheelchair dependence

## 2023-08-20 NOTE — Progress Notes (Deleted)
 Cardiology Office Note    Patient Name: Justin Molina. Date of Encounter: 08/20/2023  Primary Care Provider:  [facility] Primary Cardiologist:  None Primary Electrophysiologist: None   Past Medical History    Past Medical History:  Diagnosis Date   Chronic renal insufficiency    Diabetes mellitus without complication (HCC)    Hypertension    Stroke [facility])     History of Present Illness  Justin Molina. is a 63 y.o. 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  Justin Molina was last seen/9/25 for hospital follow-up CHF exacerbation.  He was found to have stable heart rate and BP during follow-up medications were reviewed with him and his wife during visit.  BMET was checked following visit with plan to titrate GDMT further however renal function had worsened since discharge and patient was referred to nephrology and AHF clinic.  He was seen by Dr. Zenaida on 07/10/23.  He contacted our office on 07/05/2023 with complaint of elevated weight and lower extremity edema Lasix  increased to 80 mg daily with minimal improvement.  BP during visit with Dr. Zenaida was 172/94.  He had Lasix  changed to torsemide  60 mg twice daily x 2 weeks potassium increased to 20 mEq twice daily.  Plan to start spironolactone  if renal function was stable.  He was advised to follow-up in 2 weeks at HF clinic.   Patient denies chest pain, palpitations, dyspnea, PND, orthopnea, nausea, vomiting, dizziness, syncope, edema, weight gain, or early satiety.   Discussed the use of AI scribe software for clinical note transcription with the patient, who gave verbal consent to proceed.  History of Present Illness    ***Notes:   Review of Systems  Please see the history of present illness.    All other systems reviewed and are otherwise negative except as  noted above.  Physical Exam    Wt Readings from Last 3 Encounters:  07/10/23 286 lb 3.2 oz (129.8 kg)  05/15/23 274 lb (124.3 kg)  04/16/23 273 lb 5.9 oz (124 kg)   CD:Uyzmz were no vitals filed for this visit.,There is no height or weight on file to calculate BMI. GEN: Well nourished, well developed in no acute distress Neck: No JVD; No carotid bruits Pulmonary: Clear to auscultation without rales, wheezing or rhonchi  Cardiovascular: Normal rate. Regular rhythm. Normal S1. Normal S2.   Murmurs: There is no murmur.  ABDOMEN: Soft, non-tender, non-distended EXTREMITIES:  No edema; No deformity   EKG/LABS/ Recent Cardiac Studies   ECG personally reviewed by me today - ***  Risk Assessment/Calculations:   {Does this patient have ATRIAL FIBRILLATION?:[phone]}      Lab Results  Component Value Date   WBC 4.9 04/09/2023   HGB 10.8 (L) 04/09/2023   HCT 34.5 (L) 04/09/2023   MCV 81.2 04/09/2023   PLT 196 04/09/2023   Lab Results  Component Value Date   CREATININE 2.03 (H) 05/15/2023   BUN 29 (H) 05/15/2023   NA 144 05/15/2023   K 4.4 05/15/2023   CL 103 05/15/2023   CO2 24 05/15/2023   No results found for: CHOL, HDL, LDLCALC, LDLDIRECT, TRIG, CHOLHDL  Lab Results  Component Value Date   HGBA1C 7.5 (H) 01/18/2023   Assessment & Plan    Assessment and Plan Assessment & Plan     1.Chronic diastolic CHF: -  Patient recently admitted for CHF exacerbation and today is  2.Chronic AF: -Patient currently rate controlled with carvedilol  25 mg twice daily -Continue Eliquis  5 mg twice daily    3.  Essential hypertension  4.  History of CVA: -s/p  left MCA stroke in 2009 and right hemispheric CVA 05/11/2019 with right-sided residual weakness -Continue current GDMT with Lipitor  80 mg and currently not on ASA 81 mg due to Eliquis   5.  DM type II:      Disposition: Follow-up with None or APP in *** months {Are you ordering a CV Procedure (e.g. stress  test, cath, DCCV, TEE, etc)?   Press F2        :[id number]}   Signed, Wyn Raddle, Jackee Shove, NP 08/20/2023, 12:22 PM [facility]

## 2023-08-21 ENCOUNTER — Ambulatory Visit: Admitting: Nurse Practitioner

## 2023-08-21 DIAGNOSIS — I482 Chronic atrial fibrillation, unspecified: Secondary | ICD-10-CM

## 2023-08-21 DIAGNOSIS — I5032 Chronic diastolic (congestive) heart failure: Secondary | ICD-10-CM

## 2023-08-21 DIAGNOSIS — I6930 Unspecified sequelae of cerebral infarction: Secondary | ICD-10-CM

## 2023-08-21 DIAGNOSIS — I10 Essential (primary) hypertension: Secondary | ICD-10-CM

## 2023-08-21 DIAGNOSIS — E1169 Type 2 diabetes mellitus with other specified complication: Secondary | ICD-10-CM

## 2023-09-22 NOTE — Progress Notes (Signed)
 Cardiology Office Note    Patient Name: Justin Molina. Date of Encounter: 09/22/2023  Primary Care Provider:  [facility] Primary Cardiologist:  None Primary Electrophysiologist: None   Past Medical History    Past Medical History:  Diagnosis Date   Chronic renal insufficiency    Diabetes mellitus without complication (HCC)    Hypertension    Stroke [facility])     History of Present Illness  Willian Molina. is a 63 y.o. 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   [name] was last seen/9/25 for hospital follow-up CHF exacerbation.  He was found to have stable heart rate and BP during follow-up medications were reviewed with him and his wife during visit.  BMET was checked following visit with plan to titrate GDMT further however renal function had worsened since discharge and patient was referred to nephrology and AHF clinic.  He was seen by Dr. Zenaida on 07/10/23.  He contacted our office on 07/05/2023 with complaint of elevated weight and lower extremity edema Lasix  increased to 80 mg daily with minimal improvement.  BP during visit with Dr. Zenaida was 172/94.  He had Lasix  changed to torsemide  60 mg twice daily x 2 weeks potassium increased to 20 mEq twice daily.  Plan to start spironolactone  if renal function was stable.  He was advised to follow-up in 2 weeks at HF clinic.  Patient denies chest pain, palpitations, dyspnea, PND, orthopnea, nausea, vomiting, dizziness, syncope, edema, weight gain, or early satiety.   Discussed the use of AI scribe software for clinical note transcription with the patient, who gave verbal consent to proceed.  History of Present Illness    ***Notes:   Review of Systems  Please see the history of present illness.    All other systems reviewed and are otherwise negative except as  noted above.  Physical Exam    Wt Readings from Last 3 Encounters:  07/10/23 286 lb 3.2 oz (129.8 kg)  05/15/23 274 lb (124.3 kg)  04/16/23 273 lb 5.9 oz (124 kg)   CD:Uyzmz were no vitals filed for this visit.,There is no height or weight on file to calculate BMI. GEN: Well nourished, well developed in no acute distress Neck: No JVD; No carotid bruits Pulmonary: Clear to auscultation without rales, wheezing or rhonchi  Cardiovascular: Normal rate. Regular rhythm. Normal S1. Normal S2.   Murmurs: There is no murmur.  ABDOMEN: Soft, non-tender, non-distended EXTREMITIES:  No edema; No deformity   EKG/LABS/ Recent Cardiac Studies   ECG personally reviewed by me today - ***  Risk Assessment/Calculations:   {Does this patient have ATRIAL FIBRILLATION?[phone]}      Lab Results  Component Value Date   WBC 4.9 04/09/2023   HGB 10.8 (L) 04/09/2023   HCT 34.5 (L) 04/09/2023   MCV 81.2 04/09/2023   PLT 196 04/09/2023   Lab Results  Component Value Date   CREATININE 2.03 (H) 05/15/2023   BUN 29 (H) 05/15/2023   NA 144 05/15/2023   K 4.4 05/15/2023   CL 103 05/15/2023   CO2 24 05/15/2023   No results found for: CHOL, HDL, LDLCALC, LDLDIRECT, TRIG, CHOLHDL  Lab Results  Component Value Date   HGBA1C 7.5 (H) 01/18/2023   Assessment & Plan    Assessment and Plan Assessment & Plan     1.Chronic diastolic CHF: -  Patient recently admitted for CHF exacerbation and today is   2.Chronic AF: -Patient currently rate controlled with carvedilol  25 mg twice daily -Continue Eliquis  5 mg twice daily     3.  Essential hypertension   4.  History of CVA: -s/p  left MCA stroke in 2009 and right hemispheric CVA 05/11/2019 with right-sided residual weakness -Continue current GDMT with Lipitor  80 mg and currently not on ASA 81 mg due to Eliquis    5.  DM type II:      Disposition: Follow-up with None or APP in *** months {Are you ordering a CV Procedure (e.g. stress  test, cath, DCCV, TEE, etc)?   Press F2        :[id number]}   Signed, Wyn Raddle, Jackee Shove, NP 09/22/2023, 1:31 PM [facility]

## 2023-09-23 ENCOUNTER — Encounter: Payer: Self-pay | Admitting: Nurse Practitioner

## 2023-09-23 ENCOUNTER — Ambulatory Visit: Attending: Nurse Practitioner | Admitting: Nurse Practitioner

## 2023-09-23 VITALS — BP 120/80 | HR 81 | Ht 71.0 in | Wt 285.0 lb

## 2023-09-23 DIAGNOSIS — I6930 Unspecified sequelae of cerebral infarction: Secondary | ICD-10-CM | POA: Diagnosis not present

## 2023-09-23 DIAGNOSIS — E785 Hyperlipidemia, unspecified: Secondary | ICD-10-CM

## 2023-09-23 DIAGNOSIS — E1169 Type 2 diabetes mellitus with other specified complication: Secondary | ICD-10-CM

## 2023-09-23 DIAGNOSIS — I5032 Chronic diastolic (congestive) heart failure: Secondary | ICD-10-CM | POA: Diagnosis not present

## 2023-09-23 DIAGNOSIS — I482 Chronic atrial fibrillation, unspecified: Secondary | ICD-10-CM

## 2023-09-23 DIAGNOSIS — I10 Essential (primary) hypertension: Secondary | ICD-10-CM

## 2023-09-23 MED ORDER — TORSEMIDE 20 MG PO TABS
40.0000 mg | ORAL_TABLET | Freq: Two times a day (BID) | ORAL | 3 refills | Status: DC
Start: 2023-09-23 — End: 2023-12-20

## 2023-09-23 NOTE — Patient Instructions (Addendum)
 Medication Instructions:  DECREASE Torsemide  to 40mg  Take 2 (20mg ) tablets twice a day; you can take an additional 20mg  as needed for shortness of breath *If you need a refill on your cardiac medications before your next appointment, please call your pharmacy*  Lab Work: None ordered If you have labs (blood work) drawn today and your tests are completely normal, you will receive your results only by: MyChart Message (if you have MyChart) OR A paper copy in the mail If you have any lab test that is abnormal or we need to change your treatment, we will call you to review the results.  Testing/Procedures: None ordered  Follow-Up: At [facility], you and your health needs are our priority.  As part of our continuing mission to provide you with exceptional heart care, our providers are all part of one team.  This team includes your primary Cardiologist (physician) and Advanced Practice Providers or APPs (Physician Assistants and Nurse Practitioners) who all work together to provide you with the care you need, when you need it.  Your next appointment:   3 month(s)  Provider:   Medford Nanas, MD  We recommend signing up for the patient portal called MyChart.  Sign up information is provided on this After Visit Summary.  MyChart is used to connect with patients for Virtual Visits (Telemedicine).  Patients are able to view lab/test results, encounter notes, upcoming appointments, etc.  Non-urgent messages can be sent to your provider as well.   To learn more about what you can do with MyChart, go to [email or web address].   Other Instructions

## 2023-09-25 ENCOUNTER — Other Ambulatory Visit: Payer: Self-pay | Admitting: Nephrology

## 2023-09-25 DIAGNOSIS — N1832 Chronic kidney disease, stage 3b: Secondary | ICD-10-CM

## 2023-09-30 ENCOUNTER — Other Ambulatory Visit

## 2023-10-02 ENCOUNTER — Ambulatory Visit: Attending: Family | Admitting: Physical Therapy

## 2023-10-21 ENCOUNTER — Ambulatory Visit: Admitting: Podiatry

## 2023-11-26 ENCOUNTER — Other Ambulatory Visit

## 2023-11-27 ENCOUNTER — Ambulatory Visit
Admission: RE | Admit: 2023-11-27 | Discharge: 2023-11-27 | Disposition: A | Discharge status: Home / Self Care | Source: Ambulatory Visit | Attending: Nephrology | Admitting: Nephrology

## 2023-11-27 DIAGNOSIS — N1832 Chronic kidney disease, stage 3b: Secondary | ICD-10-CM

## 2023-12-01 ENCOUNTER — Emergency Department (HOSPITAL_COMMUNITY)

## 2023-12-01 ENCOUNTER — Encounter (HOSPITAL_COMMUNITY): Payer: Self-pay | Admitting: Emergency Medicine

## 2023-12-01 ENCOUNTER — Other Ambulatory Visit: Payer: Self-pay

## 2023-12-01 ENCOUNTER — Inpatient Hospital Stay (HOSPITAL_COMMUNITY)
Admission: EM | Admit: 2023-12-01 | Discharge: 2023-12-06 | DRG: 638 | Disposition: A | Discharge status: Skilled Nursing Facility | Attending: Family Medicine | Admitting: Family Medicine

## 2023-12-01 DIAGNOSIS — I447 Left bundle-branch block, unspecified: Secondary | ICD-10-CM | POA: Diagnosis present

## 2023-12-01 DIAGNOSIS — Z7985 Long-term (current) use of injectable non-insulin antidiabetic drugs: Secondary | ICD-10-CM

## 2023-12-01 DIAGNOSIS — Z5941 Food insecurity: Secondary | ICD-10-CM

## 2023-12-01 DIAGNOSIS — Z1152 Encounter for screening for COVID-19: Secondary | ICD-10-CM | POA: Diagnosis not present

## 2023-12-01 DIAGNOSIS — E785 Hyperlipidemia, unspecified: Secondary | ICD-10-CM | POA: Diagnosis present

## 2023-12-01 DIAGNOSIS — E1122 Type 2 diabetes mellitus with diabetic chronic kidney disease: Secondary | ICD-10-CM | POA: Diagnosis present

## 2023-12-01 DIAGNOSIS — I4430 Unspecified atrioventricular block: Secondary | ICD-10-CM | POA: Diagnosis present

## 2023-12-01 DIAGNOSIS — I69320 Aphasia following cerebral infarction: Secondary | ICD-10-CM

## 2023-12-01 DIAGNOSIS — I4819 Other persistent atrial fibrillation: Secondary | ICD-10-CM | POA: Diagnosis not present

## 2023-12-01 DIAGNOSIS — Z91041 Radiographic dye allergy status: Secondary | ICD-10-CM | POA: Diagnosis not present

## 2023-12-01 DIAGNOSIS — I4811 Longstanding persistent atrial fibrillation: Secondary | ICD-10-CM | POA: Diagnosis present

## 2023-12-01 DIAGNOSIS — E860 Dehydration: Secondary | ICD-10-CM | POA: Diagnosis present

## 2023-12-01 DIAGNOSIS — Q2112 Patent foramen ovale: Secondary | ICD-10-CM

## 2023-12-01 DIAGNOSIS — I4891 Unspecified atrial fibrillation: Secondary | ICD-10-CM | POA: Insufficient documentation

## 2023-12-01 DIAGNOSIS — Z7984 Long term (current) use of oral hypoglycemic drugs: Secondary | ICD-10-CM

## 2023-12-01 DIAGNOSIS — E876 Hypokalemia: Secondary | ICD-10-CM | POA: Diagnosis present

## 2023-12-01 DIAGNOSIS — I480 Paroxysmal atrial fibrillation: Secondary | ICD-10-CM | POA: Diagnosis present

## 2023-12-01 DIAGNOSIS — N400 Enlarged prostate without lower urinary tract symptoms: Secondary | ICD-10-CM | POA: Diagnosis present

## 2023-12-01 DIAGNOSIS — Z794 Long term (current) use of insulin: Secondary | ICD-10-CM | POA: Diagnosis not present

## 2023-12-01 DIAGNOSIS — T383X6A Underdosing of insulin and oral hypoglycemic [antidiabetic] drugs, initial encounter: Secondary | ICD-10-CM | POA: Diagnosis present

## 2023-12-01 DIAGNOSIS — G4733 Obstructive sleep apnea (adult) (pediatric): Secondary | ICD-10-CM | POA: Diagnosis present

## 2023-12-01 DIAGNOSIS — I130 Hypertensive heart and chronic kidney disease with heart failure and stage 1 through stage 4 chronic kidney disease, or unspecified chronic kidney disease: Secondary | ICD-10-CM | POA: Diagnosis present

## 2023-12-01 DIAGNOSIS — E1100 Type 2 diabetes mellitus with hyperosmolarity without nonketotic hyperglycemic-hyperosmolar coma (NKHHC): Secondary | ICD-10-CM | POA: Insufficient documentation

## 2023-12-01 DIAGNOSIS — Z79899 Other long term (current) drug therapy: Secondary | ICD-10-CM | POA: Diagnosis not present

## 2023-12-01 DIAGNOSIS — I2510 Atherosclerotic heart disease of native coronary artery without angina pectoris: Secondary | ICD-10-CM | POA: Diagnosis present

## 2023-12-01 DIAGNOSIS — R739 Hyperglycemia, unspecified: Principal | ICD-10-CM

## 2023-12-01 DIAGNOSIS — E870 Hyperosmolality and hypernatremia: Secondary | ICD-10-CM | POA: Diagnosis present

## 2023-12-01 DIAGNOSIS — N1832 Chronic kidney disease, stage 3b: Secondary | ICD-10-CM | POA: Diagnosis present

## 2023-12-01 DIAGNOSIS — Z91148 Patient's other noncompliance with medication regimen for other reason: Secondary | ICD-10-CM

## 2023-12-01 DIAGNOSIS — N179 Acute kidney failure, unspecified: Secondary | ICD-10-CM | POA: Diagnosis present

## 2023-12-01 DIAGNOSIS — Z7901 Long term (current) use of anticoagulants: Secondary | ICD-10-CM

## 2023-12-01 DIAGNOSIS — I1A0 Resistant hypertension: Secondary | ICD-10-CM | POA: Diagnosis present

## 2023-12-01 DIAGNOSIS — Z789 Other specified health status: Secondary | ICD-10-CM

## 2023-12-01 DIAGNOSIS — R410 Disorientation, unspecified: Secondary | ICD-10-CM | POA: Diagnosis present

## 2023-12-01 DIAGNOSIS — I5032 Chronic diastolic (congestive) heart failure: Secondary | ICD-10-CM | POA: Diagnosis present

## 2023-12-01 DIAGNOSIS — E1165 Type 2 diabetes mellitus with hyperglycemia: Principal | ICD-10-CM | POA: Diagnosis present

## 2023-12-01 HISTORY — DX: Unspecified atrial fibrillation: I48.91

## 2023-12-01 HISTORY — DX: Paroxysmal atrial fibrillation: I48.0

## 2023-12-01 LAB — URINALYSIS, W/ REFLEX TO CULTURE (INFECTION SUSPECTED)
Bacteria, UA: NONE SEEN
Bilirubin Urine: NEGATIVE
Glucose, UA: >=500 mg/dL — AB
Ketones, ur: 5 mg/dL — AB
Leukocytes,Ua: NEGATIVE
Nitrite: NEGATIVE
Protein, ur: 100 mg/dL — AB
Specific Gravity, Urine: 1.024 (ref 1.005–1.030)
pH: 6 (ref 5.0–8.0)

## 2023-12-01 LAB — CBC WITH DIFFERENTIAL/PLATELET
Abs Immature Granulocytes: 0.01 K/uL (ref 0.00–0.07)
Basophils Absolute: 0 K/uL (ref 0.0–0.1)
Basophils Relative: 1 %
Eosinophils Absolute: 0 K/uL (ref 0.0–0.5)
Eosinophils Relative: 1 %
HCT: 55.9 % — ABNORMAL HIGH (ref 39.0–52.0)
Hemoglobin: 18.6 g/dL — ABNORMAL HIGH (ref 13.0–17.0)
Immature Granulocytes: 0 %
Lymphocytes Relative: 21 %
Lymphs Abs: 1.1 K/uL (ref 0.7–4.0)
MCH: 27.4 pg (ref 26.0–34.0)
MCHC: 33.3 g/dL (ref 30.0–36.0)
MCV: 82.2 fL (ref 80.0–100.0)
Monocytes Absolute: 0.4 K/uL (ref 0.1–1.0)
Monocytes Relative: 7 %
Neutro Abs: 3.9 K/uL (ref 1.7–7.7)
Neutrophils Relative %: 70 %
Platelets: 136 K/uL — ABNORMAL LOW (ref 150–400)
RBC: 6.8 MIL/uL — ABNORMAL HIGH (ref 4.22–5.81)
RDW: 16.4 % — ABNORMAL HIGH (ref 11.5–15.5)
WBC: 5.5 K/uL (ref 4.0–10.5)
nRBC: 0 % (ref 0.0–0.2)

## 2023-12-01 LAB — GLUCOSE, CAPILLARY
Glucose-Capillary: 201 mg/dL — ABNORMAL HIGH (ref 70–99)
Glucose-Capillary: 206 mg/dL — ABNORMAL HIGH (ref 70–99)
Glucose-Capillary: 211 mg/dL — ABNORMAL HIGH (ref 70–99)

## 2023-12-01 LAB — I-STAT VENOUS BLOOD GAS, ED
Acid-Base Excess: 6 mmol/L — ABNORMAL HIGH (ref 0.0–2.0)
Bicarbonate: 33.2 mmol/L — ABNORMAL HIGH (ref 20.0–28.0)
Calcium, Ion: 1.14 mmol/L — ABNORMAL LOW (ref 1.15–1.40)
HCT: 55 % — ABNORMAL HIGH (ref 39.0–52.0)
Hemoglobin: 18.7 g/dL — ABNORMAL HIGH (ref 13.0–17.0)
O2 Saturation: 57 %
Potassium: 3.9 mmol/L (ref 3.5–5.1)
Sodium: 153 mmol/L — ABNORMAL HIGH (ref 135–145)
TCO2: 35 mmol/L — ABNORMAL HIGH (ref 22–32)
pCO2, Ven: 53.4 mmHg (ref 44–60)
pH, Ven: 7.401 (ref 7.25–7.43)
pO2, Ven: 31 mmHg — CL (ref 32–45)

## 2023-12-01 LAB — BASIC METABOLIC PANEL WITH GFR
Anion gap: 14 (ref 5–15)
Anion gap: 16 — ABNORMAL HIGH (ref 5–15)
BUN: 45 mg/dL — ABNORMAL HIGH (ref 8–23)
BUN: 45 mg/dL — ABNORMAL HIGH (ref 8–23)
CO2: 26 mmol/L (ref 22–32)
CO2: 28 mmol/L (ref 22–32)
Calcium: 9.3 mg/dL (ref 8.9–10.3)
Calcium: 8.8 mg/dL — ABNORMAL LOW (ref 8.9–10.3)
Chloride: 117 mmol/L — ABNORMAL HIGH (ref 98–111)
Chloride: 112 mmol/L — ABNORMAL HIGH (ref 98–111)
Creatinine, Ser: 2.76 mg/dL — ABNORMAL HIGH (ref 0.61–1.24)
Creatinine, Ser: 2.94 mg/dL — ABNORMAL HIGH (ref 0.61–1.24)
GFR, Estimated: 25 mL/min — ABNORMAL LOW (ref >60)
GFR, Estimated: 23 mL/min — ABNORMAL LOW (ref >60)
Glucose, Bld: 148 mg/dL — ABNORMAL HIGH (ref 70–99)
Glucose, Bld: 206 mg/dL — ABNORMAL HIGH (ref 70–99)
Potassium: 3.4 mmol/L — ABNORMAL LOW (ref 3.5–5.1)
Potassium: 3.8 mmol/L (ref 3.5–5.1)
Sodium: 157 mmol/L — ABNORMAL HIGH (ref 135–145)
Sodium: 156 mmol/L — ABNORMAL HIGH (ref 135–145)

## 2023-12-01 LAB — PROTIME-INR
INR: 1.2 (ref 0.8–1.2)
Prothrombin Time: 15.6 s — ABNORMAL HIGH (ref 11.4–15.2)

## 2023-12-01 LAB — I-STAT CG4 LACTIC ACID, ED
Lactic Acid, Venous: 1.6 mmol/L (ref 0.5–1.9)
Lactic Acid, Venous: 1.5 mmol/L (ref 0.5–1.9)

## 2023-12-01 LAB — I-STAT CHEM 8, ED
BUN: 50 mg/dL — ABNORMAL HIGH (ref 8–23)
Calcium, Ion: 1.13 mmol/L — ABNORMAL LOW (ref 1.15–1.40)
Chloride: 109 mmol/L (ref 98–111)
Creatinine, Ser: 2.8 mg/dL — ABNORMAL HIGH (ref 0.61–1.24)
Glucose, Bld: 605 mg/dL (ref 70–99)
HCT: 55 % — ABNORMAL HIGH (ref 39.0–52.0)
Hemoglobin: 18.7 g/dL — ABNORMAL HIGH (ref 13.0–17.0)
Potassium: 3.9 mmol/L (ref 3.5–5.1)
Sodium: 154 mmol/L — ABNORMAL HIGH (ref 135–145)
TCO2: 30 mmol/L (ref 22–32)

## 2023-12-01 LAB — OSMOLALITY: Osmolality: 347 mosm/kg (ref 275–295)

## 2023-12-01 LAB — RESP PANEL BY RT-PCR (RSV, FLU A&B, COVID)  RVPGX2
Influenza A by PCR: NEGATIVE
Influenza B by PCR: NEGATIVE
Resp Syncytial Virus by PCR: NEGATIVE
SARS Coronavirus 2 by RT PCR: NEGATIVE

## 2023-12-01 LAB — COMPREHENSIVE METABOLIC PANEL WITH GFR
ALT: 18 U/L (ref 0–44)
AST: 23 U/L (ref 15–41)
Albumin: 3.8 g/dL (ref 3.5–5.0)
Alkaline Phosphatase: 98 U/L (ref 38–126)
Anion gap: 15 (ref 5–15)
BUN: 47 mg/dL — ABNORMAL HIGH (ref 8–23)
CO2: 27 mmol/L (ref 22–32)
Calcium: 9.3 mg/dL (ref 8.9–10.3)
Chloride: 107 mmol/L (ref 98–111)
Creatinine, Ser: 3 mg/dL — ABNORMAL HIGH (ref 0.61–1.24)
GFR, Estimated: 23 mL/min — ABNORMAL LOW (ref >60)
Glucose, Bld: 606 mg/dL (ref 70–99)
Potassium: 4.2 mmol/L (ref 3.5–5.1)
Sodium: 149 mmol/L — ABNORMAL HIGH (ref 135–145)
Total Bilirubin: 1.1 mg/dL (ref 0.0–1.2)
Total Protein: 8.3 g/dL — ABNORMAL HIGH (ref 6.5–8.1)

## 2023-12-01 LAB — CBG MONITORING, ED
Glucose-Capillary: 423 mg/dL — ABNORMAL HIGH (ref 70–99)
Glucose-Capillary: 345 mg/dL — ABNORMAL HIGH (ref 70–99)
Glucose-Capillary: 188 mg/dL — ABNORMAL HIGH (ref 70–99)
Glucose-Capillary: 125 mg/dL — ABNORMAL HIGH (ref 70–99)
Glucose-Capillary: 156 mg/dL — ABNORMAL HIGH (ref 70–99)
Glucose-Capillary: 140 mg/dL — ABNORMAL HIGH (ref 70–99)

## 2023-12-01 LAB — CK: Total CK: 187 U/L (ref 49–397)

## 2023-12-01 LAB — BRAIN NATRIURETIC PEPTIDE: B Natriuretic Peptide: 165.5 pg/mL — ABNORMAL HIGH (ref 0.0–100.0)

## 2023-12-01 LAB — TROPONIN I (HIGH SENSITIVITY)
Troponin I (High Sensitivity): 58 ng/L — ABNORMAL HIGH (ref <18)
Troponin I (High Sensitivity): 59 ng/L — ABNORMAL HIGH (ref <18)

## 2023-12-01 MED ORDER — DILTIAZEM HCL-DEXTROSE 125-5 MG/125ML-% IV SOLN (PREMIX)
5.0000 mg/h | INTRAVENOUS | Status: DC
  Administered 2023-12-01: 5 mg/h via INTRAVENOUS
  Administered 2023-12-01: 15 mg/h via INTRAVENOUS
  Filled 2023-12-01 (×4): qty 125

## 2023-12-01 MED ORDER — DEXTROSE 50 % IV SOLN: 0.0000 mL | INTRAVENOUS | Status: DC | PRN

## 2023-12-01 MED ORDER — ISOSORBIDE MONONITRATE ER 60 MG PO TB24
120.0000 mg | ORAL_TABLET | Freq: Every day | ORAL | Status: DC
  Administered 2023-12-01 – 2023-12-06 (×6): 120 mg via ORAL
  Filled 2023-12-01 (×5): qty 2
  Filled 2023-12-01: qty 4

## 2023-12-01 MED ORDER — APIXABAN 5 MG PO TABS
5.0000 mg | ORAL_TABLET | Freq: Two times a day (BID) | ORAL | Status: DC
  Administered 2023-12-01 – 2023-12-06 (×11): 5 mg via ORAL
  Filled 2023-12-01 (×11): qty 1

## 2023-12-01 MED ORDER — POTASSIUM CHLORIDE 10 MEQ/100ML IV SOLN
10.0000 meq | INTRAVENOUS | Status: AC
  Administered 2023-12-01 (×2): 10 meq via INTRAVENOUS
  Filled 2023-12-01 (×2): qty 100

## 2023-12-01 MED ORDER — CARVEDILOL 25 MG PO TABS
25.0000 mg | ORAL_TABLET | Freq: Two times a day (BID) | ORAL | Status: DC
  Administered 2023-12-01 – 2023-12-06 (×11): 25 mg via ORAL
  Filled 2023-12-01 (×9): qty 1
  Filled 2023-12-01: qty 2
  Filled 2023-12-01: qty 1

## 2023-12-01 MED ORDER — DOXAZOSIN MESYLATE 8 MG PO TABS
8.0000 mg | ORAL_TABLET | Freq: Every day | ORAL | Status: DC
  Administered 2023-12-01 – 2023-12-06 (×6): 8 mg via ORAL
  Filled 2023-12-01 (×7): qty 1

## 2023-12-01 MED ORDER — INSULIN REGULAR(HUMAN) IN NACL 100-0.9 UT/100ML-% IV SOLN
INTRAVENOUS | Status: AC
  Administered 2023-12-01: 11.5 [IU]/h via INTRAVENOUS
  Administered 2023-12-02: 3.8 [IU]/h via INTRAVENOUS
  Administered 2023-12-02: 4 [IU]/h via INTRAVENOUS
  Filled 2023-12-01 (×2): qty 100

## 2023-12-01 MED ORDER — DEXTROSE IN LACTATED RINGERS 5 % IV SOLN: INTRAVENOUS | Status: AC

## 2023-12-01 MED ORDER — SODIUM CHLORIDE 0.9 % IV BOLUS
1000.0000 mL | Freq: Once | INTRAVENOUS | Status: AC
  Administered 2023-12-01: 1000 mL via INTRAVENOUS

## 2023-12-01 MED ORDER — POTASSIUM CHLORIDE 10 MEQ/100ML IV SOLN
10.0000 meq | INTRAVENOUS | Status: AC
  Administered 2023-12-01 – 2023-12-02 (×6): 10 meq via INTRAVENOUS
  Filled 2023-12-01 (×3): qty 100

## 2023-12-01 MED ORDER — AMLODIPINE BESYLATE 10 MG PO TABS
10.0000 mg | ORAL_TABLET | Freq: Every day | ORAL | Status: DC
Start: 2023-12-01 — End: 2023-12-02
  Administered 2023-12-01 – 2023-12-02 (×2): 10 mg via ORAL
  Filled 2023-12-01: qty 1
  Filled 2023-12-01: qty 2

## 2023-12-01 MED ORDER — EMPAGLIFLOZIN 10 MG PO TABS
10.0000 mg | ORAL_TABLET | Freq: Every day | ORAL | Status: DC
  Administered 2023-12-01 – 2023-12-02 (×2): 10 mg via ORAL
  Filled 2023-12-01 (×2): qty 1

## 2023-12-01 MED ORDER — LOSARTAN POTASSIUM 25 MG PO TABS
25.0000 mg | ORAL_TABLET | Freq: Every day | ORAL | Status: DC
  Administered 2023-12-01 – 2023-12-06 (×6): 25 mg via ORAL
  Filled 2023-12-01 (×6): qty 1

## 2023-12-01 MED ORDER — LACTATED RINGERS IV SOLN
INTRAVENOUS | Status: AC
Start: 2023-12-01 — End: 2023-12-02

## 2023-12-01 MED ORDER — INSULIN ASPART 100 UNIT/ML IJ SOLN: 10.0000 [IU] | Freq: Once | INTRAMUSCULAR | Status: DC

## 2023-12-01 MED ORDER — INSULIN ASPART 100 UNIT/ML IJ SOLN
10.0000 [IU] | Freq: Once | INTRAMUSCULAR | Status: AC
Start: 2023-12-01 — End: 2023-12-01
  Administered 2023-12-01: 10 [IU] via SUBCUTANEOUS

## 2023-12-01 MED ORDER — ATORVASTATIN CALCIUM 80 MG PO TABS
80.0000 mg | ORAL_TABLET | Freq: Every day | ORAL | Status: DC
  Administered 2023-12-01 – 2023-12-06 (×6): 80 mg via ORAL
  Filled 2023-12-01 (×7): qty 1

## 2023-12-01 NOTE — Hospital Course (Addendum)
 [name]. 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 His hospital course is detailed below:  HHS Patient with a week of poor p.o. and  high glucose readings on his glucometer, with acute onset of confusion shortly prior to admission.  Glucose 605, pH within normal limits, elevated anion gap, urinary ketones, who appeared to be hypovolemic with hemoconcentration and hypernatremia.  Insulin  drip started with IV fluid administration, and transitioned to long-acting insulin *** when patient's anion gap had resolved.  Patient had ***improvement in hypernatremia and hemoconcentration with IV fluid administration.   A-fib with RVR Heart rate to 177 with EMS. Patient started on diltiazem drip in the ED, with improvement in rate control.  Home Eliquis  continued.  Cardiology consulted and recommended ***  Other chronic conditions were medically managed with home medications and formulary alternatives as necessary (***)  PCP Follow-up Recommendations: Consider referral to cardiology for cardioversion Consider referral to Endocrinology for diabetes management

## 2023-12-01 NOTE — Plan of Care (Signed)
  Problem: Education: Goal: Ability to describe self-care measures that may prevent or decrease complications (Diabetes Survival Skills Education) will improve Outcome: Not Progressing Goal: Individualized Educational Video(s) Outcome: Not Progressing   Problem: Coping: Goal: Ability to adjust to condition or change in health will improve Outcome: Not Progressing   Problem: Fluid Volume: Goal: Ability to maintain a balanced intake and output will improve Outcome: Not Progressing   Problem: Health Behavior/Discharge Planning: Goal: Ability to identify and utilize available resources and services will improve Outcome: Not Progressing Goal: Ability to manage health-related needs will improve Outcome: Not Progressing   Problem: Nutritional: Goal: Maintenance of adequate nutrition will improve Outcome: Not Progressing Goal: Progress toward achieving an optimal weight will improve Outcome: Not Progressing   Problem: Skin Integrity: Goal: Risk for impaired skin integrity will decrease Outcome: Not Progressing   Problem: Tissue Perfusion: Goal: Adequacy of tissue perfusion will improve Outcome: Not Progressing   Problem: Education: Goal: Ability to describe self-care measures that may prevent or decrease complications (Diabetes Survival Skills Education) will improve Outcome: Not Progressing Goal: Individualized Educational Video(s) Outcome: Not Progressing

## 2023-12-01 NOTE — H&P (Addendum)
 Hospital Admission History and Physical Service Pager: [phone]  Patient name: Justin Molina. Medical record number: [id number] Date of Birth: Jan 18, 1961 Age: 63 y.o. Gender: male  Primary Care Provider: [facility] Consultants: Cardiology Code Status: Full Preferred Emergency Contact: spouse, Juell Radney Information     Name Relation Home Work Mobile   Roemer,Tonya Spouse [phone]  [phone]   Papa,narkeya Daughter   [phone]   Nham,Renesha Daughter [phone]  [phone]      Other Contacts   None on File      Chief Complaint: Confusion, decreased p.o.  Assessment and Plan: Demont Linford. is a 63 y.o. 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 and new confusion this morning, hyperglycemia, elevated anion gap, tachycardic, hemoconcentrated/dehydrated secondary to HHS.  Notably and in atrial fibrillation rhythm with RVR, HR to 170 in the ED. HHS most likely due to lack on insulin  use for the past couple months. No signs of infection or other stressor to cause HHS otherwise. Afib with RVR likely due to electrolyte and volume changes secondary to HHS.  Assessment & Plan Hyperosmolar hyperglycemic state (HHS) (HCC) - Admit to FM TS, Pruitt, progressive - Endo tool - N.p.o. - LR or D5 LR determined by CBG at a rate of 125 ml/hr, maintained rate of 125 mL/hr despite CHF given EF of 50%, no signs of pulmonary edema on CXR, and no SOB.  - Every 4 hours BMP until gap closes x 2 - Vital signs every 4 hours - A1c Hypernatremia Most likely due to dehydration with HHS.  - Will choose LR compared to NS and continue to monitor on BMP q4hr Paroxysmal A-fib (HCC) Atrial fibrillation with RVR (HCC) Chronic heart failure with preserved ejection fraction (HFpEF, >= 50%) (HCC) - Diltiazem drip -  cardiology consulted, will see patient tomorrow  - Continuous telemetry - Strict I's and O's - Daily weights - BNP - continue eliquis  5 mg - continue coreg  25 mg BID, jardiance  10 mg every day -Holding torsemide  while giving IV fluid for HHS, please see med list below for discrepancy between prescriber and patient usage Chronic health problem HTN: continue home losartan 25 mg daily, imdur  120 mg daily, doxazosin  8 mg and amlodipine  10 mg daily; holding hydralazine  100 mg TID with patient's BP not significantly elevated without meds this morning HLD: continue lipitor  80 mg every day  FEN/GI: N.p.o. VTE Prophylaxis: On home eliquis    Disposition: Progressive  History of Present Illness:  Justin Molina. is a 63 y.o. male presenting with a week of poor p.o. and new onset confusion.  Wife brought him in this morning noting that he has been feeling generally ill.  No nausea, vomiting, diarrhea.  No recent sicknesses.  His home blood glucose have been reading  high the past week.  He has not been on insulin  since the spring.  It seems like his A1c were well-controlled and his outpatient doctor discontinued the insulin , but his wife thought he should still be on it.  In the ED, he received 1 L normal saline, 10 units insulin , and was started on a diltiazem drip.  Heart rate elevated to 170, found to be in A-fib with RVR.  Glucose significantly elevated above 600, hypernatremic, ketones in his urine.  pH within normal limits though.  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   Pertinent Past Surgical History: Remainder reviewed in history tab.   Pertinent Social History: Tobacco use: No Alcohol  use: social, last drink Wednesday Other Substance use: none Lives with wife  Pertinent Family History: Remainder  reviewed in history tab.   Important Outpatient Medications: Amlodipine  10 mg daily Eliquis  5 mg twice daily Lipitor  80 mg nightly Coreg  25 mg twice daily Doxazosin  8 mg nightly Jardiance  10 mg daily Hydralazine  100 mg, patient taking twice daily Imdur  120 mg daily Losartan 25 mg daily Potassium supplementation, wife reports patient is taking 10 mill equivalents Torsemide , instructed in last heart care visit in August to take 40 mg twice daily, but patient reportedly taking 60 mg twice daily Remainder reviewed in medication history.   Objective: BP (!) 155/96   Pulse 83   Temp 98.2 F (36.8 C) (Oral)   Resp (!) 23   Ht 5' 11 (1.803 m)   Wt 129 kg   SpO2 98%   BMI 39.66 kg/m  Exam: General: Well-appearing male lying in bed CV: Tachycardic, irregular rhythm, no JVD, hepatojugular reflux visualized, bilateral tibial edema Pulm: Clear to auscultation bilaterally, patient breathing comfortably on room air, lung exam is limited by patient body habitus Abdomen: Soft, nontender, nondistended Ext: right extremity wrapped in ACE wrap, mildly edematous   Labs:  CBC BMET  Recent Labs  Lab 12/01/23 1148  WBC 5.5  HGB 18.6*  HCT 55.9*  PLT 136*   Recent Labs  Lab 12/01/23 1148  NA 149*  K 4.2  CL 107  CO2 27  BUN 47*  CREATININE 3.00*  GLUCOSE 606*  CALCIUM  9.3    Anion gap 15   EKG: Tachycardic, atrial fibrillation versus questionable atrial flutter   Imaging Studies Performed:  DG Chest Port 1 View Result Date: 12/01/2023 CLINICAL DATA:  Weakness. EXAM: PORTABLE CHEST 1 VIEW COMPARISON:  04/08/2023 FINDINGS: Cardiomegaly is stable. Mediastinal contours are unchanged. No pulmonary edema. Minor bibasilar atelectasis. No confluent opacity. No pleural fluid. No pneumothorax. Right shoulder arthropathy. IMPRESSION: Stable cardiomegaly. Minor bibasilar atelectasis. Electronically Signed   By: Andrea Gasman M.D.   On: 12/01/2023 12:13      Alena Morrison,  Elio, MD 12/01/2023, 3:57 PM PGY-1, [facility]  FPTS Intern pager: [phone], text pages welcome Secure chat group [facility] Teaching Service

## 2023-12-01 NOTE — Assessment & Plan Note (Addendum)
-   Diltiazem drip - cardiology consulted, will see patient tomorrow  - Continuous telemetry - Strict I's and O's - Daily weights - BNP - continue eliquis  5 mg - continue coreg  25 mg BID, jardiance  10 mg every day -Holding torsemide  while giving IV fluid for HHS, please see med list below for discrepancy between prescriber and patient usage

## 2023-12-01 NOTE — Plan of Care (Signed)
 FMTS Interim Progress Note  S: Patient seen on overnight rounds, lying in bed comfortably. Patient was awake and alert. Communicates in 1 word/short phrases due to aphasia from prior stroke. He is doing well tonight, denies any pain.   O: BP (!) 127/94   Pulse 63   Temp 98.1 F (36.7 C) (Oral)   Resp 20   Ht 5' 11 (1.803 m)   Wt 129 kg   SpO2 96%   BMI 39.66 kg/m   General: NAD Cardiovascular: RRR, no m/r/g  Respiratory: CTAB, normal work of breathing on room air  Abdomen: soft, non-distended, non-tender to palpation   A/P: HHS On Endotool, most recent anion gap 14. Potassium 3.4.  - Potassium repleted - Continue Endotool until anion gap 12 or less x2, will then begin transition to subcutaneous   Hypernatremia - Continue LR - Will continue to monitor with q4h BMP  Paroxysmal A-fib HFpEF Rate controled.  - Diltiazem drip - Cardiology to see in AM  Rest of plan per day team.    Lennie Raguel MATSU, DO 12/01/2023, 9:22 PM PGY-1, [facility] Family Medicine Service pager [phone]

## 2023-12-01 NOTE — Assessment & Plan Note (Addendum)
-   Admit to FM TS, Pruitt, progressive - Endo tool - N.p.o. - LR or D5 LR determined by CBG at a rate of 125 ml/hr, maintained rate of 125 mL/hr despite CHF given EF of 50%, no signs of pulmonary edema on CXR, and no SOB.  - Every 4 hours BMP until gap closes x 2 - Vital signs every 4 hours - A1c

## 2023-12-01 NOTE — ED Triage Notes (Signed)
 Pt arrives via EMS from home where EMS was called due to hyperglycemia. Pt has not had insulin  for awhile. Glucometer reading High for 5 days. Pt afib RVR (177) on EMS arrival. Wife reports lethargy. Hx of CVA with right side deficits.

## 2023-12-01 NOTE — Assessment & Plan Note (Signed)
 HTN: continue home losartan 25 mg daily, imdur  120 mg daily, doxazosin  8 mg and amlodipine  10 mg daily; holding hydralazine  100 mg TID with patient's BP not significantly elevated without meds this morning HLD: continue lipitor  80 mg every day

## 2023-12-01 NOTE — Care Management (Signed)
 Food and utility resources added to AVS

## 2023-12-01 NOTE — ED Notes (Signed)
 Spoke with Dr. Lennie with [facility] about patient BG 150's and gap 18. Per Dr. Lennie they would like to see the gap come down to 12 twice before discontinuing the insulin  gtt. Will continue to monitor patient.

## 2023-12-01 NOTE — Assessment & Plan Note (Signed)
 Most likely due to dehydration with HHS.  - Will choose LR compared to NS and continue to monitor on BMP q4hr

## 2023-12-01 NOTE — ED Provider Notes (Signed)
 [place] EMERGENCY DEPARTMENT AT [facility] Provider Note   CSN: [id number] Arrival date & time: 12/01/23  1054     Patient presents with: Hyperglycemia and Atrial Fibrillation   Justin Molina. is a 63 y.o. male.   63 year old male with prior medical history as detailed below presents for evaluation.  Patient's wife called EMS.  Per wife, patient was not acting his normal self.  He appeared to be confused.  He is much improved now in the ED.  She reports that apparently the patient had been out of his insulin  for quite some time.  Glucose checks at home have been reading high for at least 5 days.  Patient was noted to be in A-fib with RVR with a heart rate in the 170s on initial EMS evaluation.  Patient with history of stroke.  He has chronic right-sided deficits.  He has aphasia at baseline.  On initial evaluation the patient is comfortable and answers questions as best he can.  He does not appear to be in distress.  Wife, who is at bedside now, reports that patient is now at his baseline.  Prior medical history includes hemiplegia of right side, chronic aphasia, prior CVA, hypertension, diabetes, chronic kidney disease, hyperlipidemia, chronic diastolic heart failure.  The history is provided by the patient, the spouse and medical records.       Prior to Admission medications   Medication Sig Start Date End Date Taking? Authorizing Provider  acetaminophen  (TYLENOL ) 500 MG tablet Take 1 tablet (500 mg total) by mouth every 6 (six) hours as needed. 12/26/14   Patsey Satchel, NP  albuterol  (VENTOLIN  HFA) 108 (90 Base) MCG/ACT inhaler Inhale 2 puffs into the lungs every 4 (four) hours as needed for wheezing or shortness of breath. 09/24/22   [provider]  amLODipine  (NORVASC ) 10 MG tablet Take 10 mg by mouth daily.    [provider]  atorvastatin  (LIPITOR ) 80 MG tablet Take 1 tablet (80 mg total) by mouth at bedtime. 04/16/23   Arrien,  Mauricio Daniel, MD  carvedilol  (COREG ) 25 MG tablet Take 1 tablet (25 mg total) by mouth 2 (two) times daily with a meal. Patient needs office visit for more refills 04/16/23 07/09/24  Arrien, Elidia Sieving, MD  doxazosin  (CARDURA ) 4 MG tablet Take 2 tablets (8 mg total) by mouth at bedtime. Patient taking differently: Take 4 mg by mouth daily. 04/16/23   Arrien, Elidia Sieving, MD  ELIQUIS  5 MG TABS tablet Take 1 tablet (5 mg total) by mouth 2 (two) times daily. 11/17/21   Lynwood Lenis, PA-C  empagliflozin  (JARDIANCE ) 10 MG TABS tablet Take 1 tablet (10 mg total) by mouth daily. 04/16/23   Arrien, Mauricio Daniel, MD  fluticasone OREN) 50 MCG/ACT nasal spray Place 2 sprays into both nostrils daily as needed for allergies or rhinitis. 09/24/22   [provider]  glucose blood (ACCU-CHEK AVIVA PLUS) test strip Use as instructed for TID and QHS blood glucose testing 05/23/15   Jegede, Olugbemiga E, MD  hydrALAZINE  (APRESOLINE ) 100 MG tablet Take 1 tablet (100 mg total) by mouth 3 (three) times daily. Patient taking differently: Take 100 mg by mouth 2 (two) times daily. 04/16/23   Arrien, Mauricio Daniel, MD  isosorbide  mononitrate (IMDUR ) 120 MG 24 hr tablet Take 1 tablet (120 mg total) by mouth daily. 04/16/23 07/09/24  Noralee Elidia Sieving, MD  Lancets 28G MISC Check blood sugar TID & QHS 05/23/15   Jegede, Olugbemiga E, MD  LANTUS LYNFORD  100 UNIT/ML Solostar Pen Inject 6 Units into the skin daily. As needed Patient not taking: Reported on 09/23/2023 05/12/23   [provider]  loratadine  (CLARITIN ) 10 MG tablet Take 10 mg by mouth daily as needed for allergies or rhinitis. 11/06/22   [provider]  nystatin cream (MYCOSTATIN) Apply 1 Application topically daily as needed (yeast).    [provider]  OZEMPIC, 0.25 OR 0.5 MG/DOSE, 2 MG/3ML SOPN Inject 0.25 mg into the skin every Sunday. 12/27/22   [provider]  potassium chloride  SA (KLOR-CON  M) 20 MEQ  tablet Take 1 tablet (20 mEq total) by mouth 2 (two) times daily. Patient taking differently: Take 20 mEq by mouth daily. 07/10/23   Zenaida Morene PARAS, MD  torsemide  (DEMADEX ) 20 MG tablet Take 2 tablets (40 mg total) by mouth 2 (two) times daily. Can take an additional tablet prn shortness of breath 09/23/23   Wyn Jackee VEAR Mickey., NP    Allergies: Contrast media [iodinated contrast media] and Iodine    Review of Systems  All other systems reviewed and are negative.   Updated Vital Signs BP (!) 144/109   Pulse 72   Temp 98.2 F (36.8 C) (Oral)   Resp (!) 22   Ht 5' 11 (1.803 m)   Wt 129 kg   SpO2 96%   BMI 39.66 kg/m   Physical Exam Vitals and nursing note reviewed.  Constitutional:      General: He is not in acute distress.    Appearance: Normal appearance. He is well-developed.  HENT:     Head: Normocephalic and atraumatic.     Mouth/Throat:     Mouth: Mucous membranes are dry.  Eyes:     Conjunctiva/sclera: Conjunctivae normal.     Pupils: Pupils are equal, round, and reactive to light.  Cardiovascular:     Rate and Rhythm: Tachycardia present. Rhythm irregular.     Heart sounds: Normal heart sounds.  Pulmonary:     Effort: Pulmonary effort is normal. No respiratory distress.     Breath sounds: Normal breath sounds.  Abdominal:     General: There is no distension.     Palpations: Abdomen is soft.     Tenderness: There is no abdominal tenderness.  Musculoskeletal:        General: No deformity. Normal range of motion.     Cervical back: Normal range of motion and neck supple.  Skin:    General: Skin is warm and dry.  Neurological:     General: No focal deficit present.     Mental Status: He is alert and oriented to person, place, and time.     (all labs ordered are listed, but only abnormal results are displayed) Labs Reviewed  RESP PANEL BY RT-PCR (RSV, FLU A&B, COVID)  [id number]  CBC WITH DIFFERENTIAL/PLATELET  COMPREHENSIVE METABOLIC PANEL WITH GFR   PROTIME-INR  URINALYSIS, W/ REFLEX TO CULTURE (INFECTION SUSPECTED)  CK  I-STAT VENOUS BLOOD GAS, ED  I-STAT CHEM 8, ED  I-STAT CG4 LACTIC ACID, ED  TROPONIN I (HIGH SENSITIVITY)    EKG: None  Radiology: No results found.   Procedures   Medications Ordered in the ED - No data to display                                  Medical Decision Making Patient presents with noted hyperglycemia.  Patient apparently has been out of his  insulin  for quite some time per his wife.  Patient has also not been taking p.o. fluids well.  Patient was somewhat confused today which caused his family to bring him to the ED for evaluation.  Patient is mentating well on arrival.  He is at his baseline mental status.  He is in A-fib with RVR initial heart rate into the 170s.  Cardizem drip started for rate control.  Labs suggest acute kidney injury with a creatinine of 3.0.  Baseline creatinine is 2.  Additionally glucose is 600.  Sodium is in the 157 range.  Anion gap is 15.  Patient would benefit from admission for electrolyte correction, administration of IV fluids for suspected dehydration, rate control for his A-fib.  Medicine team made aware of case  Amount and/or Complexity of Data Reviewed Labs: ordered. Radiology: ordered.  Risk Prescription drug management. Decision regarding hospitalization.   CRITICAL CARE Performed by: Maude JAYSON Galloway   Total critical care time: 30 minutes  Critical care time was exclusive of separately billable procedures and treating other patients.  Critical care was necessary to treat or prevent imminent or life-threatening deterioration.  Critical care was time spent personally by me on the following activities: development of treatment plan with patient and/or surrogate as well as nursing, discussions with consultants, evaluation of patient's response to treatment, examination of patient, obtaining history from patient or surrogate, ordering and  performing treatments and interventions, ordering and review of laboratory studies, ordering and review of radiographic studies, pulse oximetry and re-evaluation of patient's condition.      Final diagnoses:  Hyperglycemia  Hypernatremia  AKI (acute kidney injury)  Atrial fibrillation with RVR [facility])    ED Discharge Orders     None          Galloway Maude JAYSON, MD 12/01/23 1330

## 2023-12-02 DIAGNOSIS — I5032 Chronic diastolic (congestive) heart failure: Secondary | ICD-10-CM | POA: Diagnosis not present

## 2023-12-02 DIAGNOSIS — I480 Paroxysmal atrial fibrillation: Secondary | ICD-10-CM | POA: Diagnosis not present

## 2023-12-02 DIAGNOSIS — N1832 Chronic kidney disease, stage 3b: Secondary | ICD-10-CM

## 2023-12-02 DIAGNOSIS — I4819 Other persistent atrial fibrillation: Secondary | ICD-10-CM

## 2023-12-02 DIAGNOSIS — N179 Acute kidney failure, unspecified: Secondary | ICD-10-CM | POA: Diagnosis not present

## 2023-12-02 LAB — BASIC METABOLIC PANEL WITH GFR
Anion gap: 11 (ref 5–15)
Anion gap: 11 (ref 5–15)
BUN: 43 mg/dL — ABNORMAL HIGH (ref 8–23)
BUN: 42 mg/dL — ABNORMAL HIGH (ref 8–23)
CO2: 27 mmol/L (ref 22–32)
CO2: 29 mmol/L (ref 22–32)
Calcium: 8.9 mg/dL (ref 8.9–10.3)
Calcium: 9 mg/dL (ref 8.9–10.3)
Chloride: 116 mmol/L — ABNORMAL HIGH (ref 98–111)
Chloride: 118 mmol/L — ABNORMAL HIGH (ref 98–111)
Creatinine, Ser: 2.86 mg/dL — ABNORMAL HIGH (ref 0.61–1.24)
Creatinine, Ser: 2.81 mg/dL — ABNORMAL HIGH (ref 0.61–1.24)
GFR, Estimated: 24 mL/min — ABNORMAL LOW (ref >60)
GFR, Estimated: 24 mL/min — ABNORMAL LOW (ref >60)
Glucose, Bld: 263 mg/dL — ABNORMAL HIGH (ref 70–99)
Glucose, Bld: 150 mg/dL — ABNORMAL HIGH (ref 70–99)
Potassium: 3.8 mmol/L (ref 3.5–5.1)
Potassium: 3.7 mmol/L (ref 3.5–5.1)
Sodium: 154 mmol/L — ABNORMAL HIGH (ref 135–145)
Sodium: 158 mmol/L — ABNORMAL HIGH (ref 135–145)

## 2023-12-02 LAB — GLUCOSE, CAPILLARY
Glucose-Capillary: 130 mg/dL — ABNORMAL HIGH (ref 70–99)
Glucose-Capillary: 155 mg/dL — ABNORMAL HIGH (ref 70–99)
Glucose-Capillary: 156 mg/dL — ABNORMAL HIGH (ref 70–99)
Glucose-Capillary: 164 mg/dL — ABNORMAL HIGH (ref 70–99)
Glucose-Capillary: 167 mg/dL — ABNORMAL HIGH (ref 70–99)
Glucose-Capillary: 175 mg/dL — ABNORMAL HIGH (ref 70–99)
Glucose-Capillary: 176 mg/dL — ABNORMAL HIGH (ref 70–99)
Glucose-Capillary: 176 mg/dL — ABNORMAL HIGH (ref 70–99)
Glucose-Capillary: 186 mg/dL — ABNORMAL HIGH (ref 70–99)
Glucose-Capillary: 199 mg/dL — ABNORMAL HIGH (ref 70–99)
Glucose-Capillary: 216 mg/dL — ABNORMAL HIGH (ref 70–99)
Glucose-Capillary: 229 mg/dL — ABNORMAL HIGH (ref 70–99)
Glucose-Capillary: 250 mg/dL — ABNORMAL HIGH (ref 70–99)
Glucose-Capillary: 254 mg/dL — ABNORMAL HIGH (ref 70–99)
Glucose-Capillary: 280 mg/dL — ABNORMAL HIGH (ref 70–99)

## 2023-12-02 LAB — HEMOGLOBIN A1C
Hgb A1c MFr Bld: 9.4 % — ABNORMAL HIGH (ref 4.8–5.6)
Mean Plasma Glucose: 223 mg/dL

## 2023-12-02 MED ORDER — INSULIN ASPART 100 UNIT/ML IJ SOLN
0.0000 [IU] | Freq: Three times a day (TID) | INTRAMUSCULAR | Status: DC
  Administered 2023-12-02: 3 [IU] via SUBCUTANEOUS
  Administered 2023-12-03: 15 [IU] via SUBCUTANEOUS
  Administered 2023-12-03 – 2023-12-04 (×3): 8 [IU] via SUBCUTANEOUS
  Administered 2023-12-04: 5 [IU] via SUBCUTANEOUS
  Administered 2023-12-04: 3 [IU] via SUBCUTANEOUS
  Administered 2023-12-05: 5 [IU] via SUBCUTANEOUS
  Administered 2023-12-05: 11 [IU] via SUBCUTANEOUS
  Administered 2023-12-05 – 2023-12-06 (×2): 5 [IU] via SUBCUTANEOUS
  Administered 2023-12-06: 11 [IU] via SUBCUTANEOUS
  Administered 2023-12-06: 3 [IU] via SUBCUTANEOUS
  Filled 2023-12-02: qty 8

## 2023-12-02 MED ORDER — POTASSIUM CHLORIDE 10 MEQ/100ML IV SOLN
10.0000 meq | INTRAVENOUS | Status: AC
  Administered 2023-12-02 (×2): 10 meq via INTRAVENOUS
  Filled 2023-12-02: qty 100

## 2023-12-02 MED ORDER — DILTIAZEM HCL ER COATED BEADS 180 MG PO CP24
180.0000 mg | ORAL_CAPSULE | Freq: Every day | ORAL | Status: DC
  Administered 2023-12-02 – 2023-12-03 (×2): 180 mg via ORAL
  Filled 2023-12-02 (×2): qty 1

## 2023-12-02 MED ORDER — INSULIN GLARGINE-YFGN 100 UNIT/ML ~~LOC~~ SOLN
15.0000 [IU] | SUBCUTANEOUS | Status: DC
  Administered 2023-12-02: 15 [IU] via SUBCUTANEOUS
  Filled 2023-12-02 (×2): qty 0.15

## 2023-12-02 NOTE — Assessment & Plan Note (Addendum)
 HR stable on dilt drip. - Cardiology consulted, appreciate recommendations - Transition from dilt drip to PO Diltiazem 180 mg daily - Discontinue amlodipine  - Continue Carvedilol  25mg  BID - Continuous cardiac monitoring - Strict I's and O's, daily weights - BNP - Continue eliquis  5 mg - Holding torsemide  while giving IV fluid for HHS, please see med list below for discrepancy between prescriber and patient usage

## 2023-12-02 NOTE — TOC CM/SW Note (Signed)
 Transition of Care (TOC) CM/SW Note    CSW attached financial resources to patients AVS.

## 2023-12-02 NOTE — Assessment & Plan Note (Addendum)
 Glucose stable in the 150s on insulin  drip. Anion gap now closed. - Transitioned off endotool - Started on 15 units basal, moderate SSI - Resumed diet - AM BMP, Mg - Hgb A1c pending - Holding home jardiance  for now

## 2023-12-02 NOTE — Assessment & Plan Note (Addendum)
 Na 158, which has remained stable. Most likely due to dehydration with HHS. Will continue to monitor. - Continue LR @ 174mL/hr  - AM BMP

## 2023-12-02 NOTE — Discharge Instructions (Signed)
 Financial Resources  Agency Name: Nurse, adult Counseling Address: [address]  [place] [address] Phone: [phone] Website: [email or web address]://[email or web address].org/financial-[email or web address]/ Service(s) Offered: Assists individuals and families, avoid bankruptcy or  foreclosure, achieve financial goals, and regain a sense  of financial well-being.  Agency Name: [facility] Address: [address] [place] [address] Phone: [phone] Website: StockBudget.de Service(s) Offered: Provides medical, food/nutrition, child care, low-income  energy, [employer] and emergency assistance.  Agency Name: [facility] Address: [address] [place]. [place] [address] Phone: [phone] Website: http://greensborourbanministry.org/ Service(s) Offered: Food assistance hours are from 9:30 a.m. to 4:30 p.m.  Monday through Friday. Those who need to come after  4:30 p.m. should call [phone] or [phone], to make  arrangements.

## 2023-12-02 NOTE — Progress Notes (Addendum)
 Daily Progress Note Intern Pager: [phone]  Patient name: Justin Molina. Medical record number: [id number] Date of birth: 12-07-1960 Age: 63 y.o. Gender: male  Primary Care Provider: [facility], [facility] Consultants: Cardiology Code Status: Full code  Pt Overview and Major Events to Date:  10/26-admitted  Assessment and Plan:  Justin Strine. is a 63 y.o.male with PMHx T2DM, HTN, CKD, HLD, aphasia, hemiplegia and hemiparesis affecting the right side, OSA, A-fib, CHF (EF 50-55%), BPH, CAD who was admitted for HHS secondary to insulin  non-compliance and also found to have A-fib with RVR. Assessment & Plan Hyperosmolar hyperglycemic state (HHS) (HCC) Glucose stable in the 150s on insulin  drip. Anion gap now closed. - Transitioned off endotool - Started on 15 units basal, moderate SSI - Resumed diet - AM BMP, Mg - Hgb A1c pending - Holding home jardiance  for now Hypernatremia Na 158, which has remained stable. Most likely due to dehydration with HHS. Will continue to monitor. - Continue LR @ 153mL/hr  - AM BMP Paroxysmal A-fib (HCC) Atrial fibrillation with RVR (HCC) Chronic heart failure with preserved ejection fraction (HFpEF, >= 50%) (HCC) HR stable on dilt drip. - Cardiology consulted, appreciate recommendations - Transition from dilt drip to PO Diltiazem 180 mg daily - Discontinue amlodipine  - Continue Carvedilol  25mg  BID - Continuous cardiac monitoring - Strict I's and O's, daily weights - BNP - Continue eliquis  5 mg - Holding torsemide  while giving IV fluid for HHS, please see med list below for discrepancy between prescriber and patient usage Chronic health problem HTN: continue home losartan 25 mg daily, imdur  120 mg daily, doxazosin  8 mg; holding hydralazine  100 mg TID with patient's BP not significantly elevated without meds this morning; stopped home Amlodipine  10 mg daily per cardiology HLD: continue lipitor  80 mg every day   FEN/GI: Carb  modified PPx: Eliquis  Dispo:Home pending clinical improvement .  Subjective:  Patient resting comfortably in bed with wife at bedside.  States he is doing well.  Denies any shortness of breath or edema.  Feels improved from admission.  Objective: Temp:  [97.5 F (36.4 C)-98.6 F (37 C)] 97.5 F (36.4 C) (10/27 0807) Pulse Rate:  [54-149] 79 (10/27 0807) Resp:  [11-36] 26 (10/27 0343) BP: (112-181)/(77-123) 120/86 (10/27 0343) SpO2:  [90 %-100 %] 98 % (10/27 0549) Weight:  [122.4 kg-129 kg] 122.4 kg (10/26 2150)  Physical Exam: General: Alert, well-appearing male in NAD.  HEENT: No sign of trauma, EOM grossly intact. Neck: Supple, normal ROM, no LAD, no thyromegaly, no focal tenderness Cardiovascular: Irregular rhythm, normal rate. No m/r/g appreciated. Pulmonary: Normal WOB. CTAB with no w/c/r present. Abdomen: Soft, non-tender, non-distended. Extremities: 1+ non-pitting edema   Laboratory: Most recent CBC Lab Results  Component Value Date   WBC 5.5 12/01/2023   HGB 18.6 (H) 12/01/2023   HCT 55.9 (H) 12/01/2023   MCV 82.2 12/01/2023   PLT 136 (L) 12/01/2023   Most recent BMP    Latest Ref Rng & Units 12/02/2023    4:06 AM  BMP  Glucose 70 - 99 mg/dL 736   BUN 8 - 23 mg/dL 43   Creatinine 9.38 - 1.24 mg/dL 7.13   Sodium 864 - 854 mmol/L 154   Potassium 3.5 - 5.1 mmol/L 3.8   Chloride 98 - 111 mmol/L 116   CO2 22 - 32 mmol/L 27   Calcium  8.9 - 10.3 mg/dL 8.9     Imaging/Diagnostic Tests: No new imaging   Jerrie Gathers, DO 12/02/2023,  8:16 AM  PGY-1, [facility] FPTS Intern pager: [phone], text pages welcome Secure chat group [facility] Teaching Service

## 2023-12-02 NOTE — Plan of Care (Signed)
  Problem: Education: Goal: Ability to describe self-care measures that may prevent or decrease complications (Diabetes Survival Skills Education) will improve 12/02/2023 0521 by Baldwin Doran BIRCH, RN Outcome: Not Progressing 12/01/2023 2227 by Baldwin Doran BIRCH, RN Outcome: Not Progressing Goal: Individualized Educational Video(s) 12/02/2023 0521 by Baldwin Doran BIRCH, RN Outcome: Not Progressing 12/01/2023 2227 by Baldwin Doran D, RN Outcome: Not Progressing   Problem: Coping: Goal: Ability to adjust to condition or change in health will improve 12/02/2023 0521 by Baldwin Doran BIRCH, RN Outcome: Not Progressing 12/01/2023 2227 by Baldwin Doran BIRCH, RN Outcome: Not Progressing   Problem: Fluid Volume: Goal: Ability to maintain a balanced intake and output will improve 12/02/2023 0521 by Baldwin Doran BIRCH, RN Outcome: Not Progressing 12/01/2023 2227 by Baldwin Doran BIRCH, RN Outcome: Not Progressing   Problem: Health Behavior/Discharge Planning: Goal: Ability to identify and utilize available resources and services will improve 12/02/2023 0521 by Baldwin Doran BIRCH, RN Outcome: Not Progressing 12/01/2023 2227 by Baldwin Doran BIRCH, RN Outcome: Not Progressing Goal: Ability to manage health-related needs will improve 12/02/2023 0521 by Baldwin Doran BIRCH, RN Outcome: Not Progressing 12/01/2023 2227 by Baldwin Doran BIRCH, RN Outcome: Not Progressing   Problem: Metabolic: Goal: Ability to maintain appropriate glucose levels will improve 12/02/2023 0521 by Baldwin Doran BIRCH, RN Outcome: Not Progressing 12/01/2023 2227 by Baldwin Doran D, RN Outcome: Not Progressing   Problem: Nutritional: Goal: Maintenance of adequate nutrition will improve 12/02/2023 0521 by Baldwin Doran BIRCH, RN Outcome: Not Progressing 12/01/2023 2227 by Baldwin Doran BIRCH, RN Outcome: Not Progressing Goal: Progress toward achieving an optimal weight will improve 12/02/2023 0521 by  Baldwin Doran BIRCH, RN Outcome: Not Progressing 12/01/2023 2227 by Baldwin Doran D, RN Outcome: Not Progressing   Problem: Skin Integrity: Goal: Risk for impaired skin integrity will decrease 12/02/2023 0521 by Baldwin Doran BIRCH, RN Outcome: Not Progressing 12/01/2023 2227 by Baldwin Doran D, RN Outcome: Not Progressing   Problem: Cardiac: Goal: Ability to maintain an adequate cardiac output will improve 12/02/2023 0521 by Baldwin Doran BIRCH, RN Outcome: Not Progressing 12/01/2023 2227 by Baldwin Doran BIRCH, RN Outcome: Not Progressing

## 2023-12-02 NOTE — Assessment & Plan Note (Addendum)
 HTN: continue home losartan 25 mg daily, imdur  120 mg daily, doxazosin  8 mg; holding hydralazine  100 mg TID with patient's BP not significantly elevated without meds this morning; stopped home Amlodipine  10 mg daily per cardiology HLD: continue lipitor  80 mg every day

## 2023-12-02 NOTE — Consult Note (Addendum)
 Cardiology Consultation   Patient ID: Charlie Catha Raddle. MRN: [id number]; DOB: 12/13/1960  Admit date: 12/01/2023 Date of Consult: 12/02/2023  PCP:  [facility]   [facility] Providers Cardiologist:  None      Patient Profile: Curby Carswell. is a 63 y.o. male with a hx of paroxysmal atrial fibrillation, HFpEF, history CVA x 2, resistant hypertension, PFO, OSA, HLD, type 2 diabetes, CKD who is being seen 12/02/2023 for the evaluation of atrial fibrillation at the request of Dr. Orie.  History of Present Illness: Mr. Alkhatib is a 63 year old male with above medical history.  Patient had previously been followed by [facility] in [place] .  Has a known history of atrial fibrillation.  While in [place] , treated with Multaq  and Eliquis .  Established care with heart care in 04/2023.  At that time, patient had been admitted with CHF exacerbation.  An echocardiogram in 01/2023 showed EF 60-65%, no regional wall motion abnormalities, mild LVH, normal RV systolic function, no significant valvular abnormalities.  Patient was treated for acute on chronic HFpEF with IV diuretics.  Patient was hypertensive and BP medications were adjusted.  His Multaq  was held due to recurrent CHF exacerbations.  Instead he was rate controlled with carvedilol .  Per review of EKGs and cardiology office appointment notes, appears patient has been in atrial fibrillation since this time.  Patient was referred to advanced heart failure and seen by Dr. Zenaida on 07/10/23.  He had issues with fluid retention despite adherence with Lasix .  Was transition from Lasix  to torsemide .  Echocardiogram 08/06/2023 showed EF 50-55%, no regional wall motion abnormalities, mild LVH, normal RV systolic function, no significant valvular abnormalities.   Patient presented to the ED on 10/26 for hyperglycemia.  Reportedly, patient had not had insulin  for quite some time.  Patient developed confusion and his  wife called EMS.  Initial vital signs showed heart rate 124 bpm, BP 144/109, respirations 22 breaths/min, oxygen 96% on room air.  Found to have blood glucose of 605.  Creatinine 3.0, potassium 4.2, Na 149.  Lactic acid 1.6.  COVID, flu, RSV negative.  Chest x-ray with stable cardiomegaly, minor bibasilar atelectasis. EKG showed atrial fibrillation with heart rate 140 bpm, incomplete LBBB.   Patient was ultimately admitted to the internal medicine teaching service for hyperosmolar hyperglycemic state. Started on LR and insulin  gtt. Atrial fibrillation was managed with IV diltiazem and home PO cardizem.   Patient denies chest pain, palpitations.  Denies shortness of breath.  His wife is at bedside.  Wife reports that patient has an excellent job taking his blood thinner.  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  She monitors his fluid intake but he continues to have occasional lower extremity swelling.  Patient recently established with [facility] for CKD management.  Past Medical History:  Diagnosis Date   Chronic renal insufficiency    Diabetes mellitus without complication (HCC)    Hypertension    Stroke [facility])     Past Surgical History:  Procedure Laterality Date   BRAIN SURGERY     GASTROSTOMY TUBE PLACEMENT     HERNIA REPAIR     TRACHEOSTOMY       Scheduled Meds:  amLODipine   10 mg Oral Daily   apixaban   5 mg Oral BID   atorvastatin   80 mg Oral QHS   carvedilol   25 mg Oral BID WC   doxazosin   8  mg Oral QHS   empagliflozin   10 mg Oral Daily   isosorbide  mononitrate  120 mg Oral Daily   losartan  25 mg Oral Daily   Continuous Infusions:  dextrose  5% lactated ringers  125 mL/hr at 12/01/23 1739   diltiazem (CARDIZEM) infusion 15 mg/hr (12/01/23 1947)   insulin  2.8 Units/hr (12/02/23 1012)   lactated ringers  125 mL/hr at 12/01/23 1639   PRN Meds: dextrose   Allergies:    Allergies   Allergen Reactions   Contrast Media [Iodinated Contrast Media] Shortness Of Breath, Nausea And Vomiting, Swelling and Other (See Comments)    Facial swelling and felt flushed, too   Iodine Shortness Of Breath, Nausea And Vomiting, Swelling and Other (See Comments)    Facial swelling and felt flushed, too    Social History:   Social History   Socioeconomic History   Marital status: Married    Spouse name: Not on file   Number of children: Not on file   Years of education: Not on file   Highest education level: Not on file  Occupational History   Not on file  Tobacco Use   Smoking status: Never   Smokeless tobacco: Never  Vaping Use   Vaping status: Never Used  Substance and Sexual Activity   Alcohol  use: No   Drug use: No   Sexual activity: Not on file  Other Topics Concern   Not on file  Social History Narrative   Not on file   Social Drivers of Health   Financial Resource Strain: Not on file  Food Insecurity: Food Insecurity Present (12/01/2023)   Hunger Vital Sign    Worried About Running Out of Food in the Last Year: Sometimes true    Ran Out of Food in the Last Year: Never true  Transportation Needs: No Transportation Needs (12/01/2023)   PRAPARE - Administrator, Civil Service (Medical): No    Lack of Transportation (Non-Medical): No  Physical Activity: Not on file  Stress: Not on file  Social Connections: Not on file  Intimate Partner Violence: Not At Risk (12/02/2023)   Humiliation, Afraid, Rape, and Kick questionnaire    Fear of Current or Ex-Partner: No    Emotionally Abused: No    Physically Abused: No    Sexually Abused: No    Family History:   Family History  Problem Relation Age of Onset   Anuerysm Father        Brain     ROS:  Please see the history of present illness.  All other ROS reviewed and negative.     Physical Exam/Data: Vitals:   12/02/23 0343 12/02/23 0549 12/02/23 0807 12/02/23 0854  BP: 120/86   (!) 120/94   Pulse: 89  79 82  Resp: (!) 26   (!) 25  Temp: 98.4 F (36.9 C)  (!) 97.5 F (36.4 C) 98.4 F (36.9 C)  TempSrc: Oral  Oral Oral  SpO2: 94% 98%  93%  Weight:      Height:        Intake/Output Summary (Last 24 hours) at 12/02/2023 1022 Last data filed at 12/02/2023 1008 Gross per 24 hour  Intake 4058.77 ml  Output 1350 ml  Net 2708.77 ml      12/01/2023    9:50 PM 12/01/2023   11:01 AM 09/23/2023    1:43 PM  Last 3 Weights  Weight (lbs) 269 lb 13.5 oz 284 lb 6.3 oz 285 lb  Weight (kg) 122.4 kg 129 kg 129.275  kg     Body mass index is 37.64 kg/m.  General:  Well nourished, well developed, in no acute distress. Laying in the bed with head elevated  HEENT: normal Neck: no JVD Cardiac:  normal S1, S2; irregular rate and rhythm. No murmurs  Lungs:  clear to auscultation bilaterally, no wheezing, rhonchi or rales.Normal WOB on room air  Abd: soft, nontender  Ext: no edema in BLE. RLE wound wrapped  Musculoskeletal:  No deformities  Skin: warm and dry  Neuro:  CNs 2-12 intact, no focal abnormalities noted Psych:  Normal affect   Telemetry:  Telemetry was personally reviewed and demonstrates:  Atrial fibrillation, HR in the 70s-80s  Relevant CV Studies: Cardiac Studies & Procedures   ______________________________________________________________________________________________   STRESS TESTS  MYOCARDIAL PERFUSION IMAGING 03/03/2012   ECHOCARDIOGRAM  ECHOCARDIOGRAM COMPLETE 08/06/2023  Narrative ECHOCARDIOGRAM REPORT    Patient Name:   Keyshun Elpers. Date of Exam: 08/06/2023 Medical Rec #:  [id number]           Height:       71.0 in Accession #:    [id number]          Weight:       286.2 lb Date of Birth:  11-06-1960          BSA:          2.455 m Patient Age:    62 years            BP:           137/88 mmHg Patient Gender: M                   HR:           83 bpm. Exam Location:  [place]  Procedure: 2D Echo, Cardiac Doppler and Color Doppler (Both  Spectral and Color Flow Doppler were utilized during procedure).  Indications:    I50.42 Chronic combined systolic (congestive) and diastolic (congestive) heart failure; I48.2 Chronic atrial fibrillation  History:        Patient has prior history of Echocardiogram examinations, most recent 01/19/2023. CHF and Cardiomyopathy, Stroke and CKD; Risk Factors:Hypertension, Diabetes, Dyslipidemia, Non-Smoker and Sleep Apnea.  Sonographer:    Alan Greenhouse RDMS, RVT, RDCS Referring Phys: [phone] JACKEE VEAR RADDLE DICK   Sonographer Comments: Image acquisition challenging due to patient body habitus. Global longitudinal strain was attempted. IMPRESSIONS   1. Left ventricular ejection fraction, by estimation, is 50 to 55%. The left ventricle has low normal function. The left ventricle has no regional wall motion abnormalities. There is mild left ventricular hypertrophy. Left ventricular diastolic parameters are indeterminate. The average left ventricular global longitudinal strain is -17.6 %. The global longitudinal strain is normal. 2. Right ventricular systolic function is normal. The right ventricular size is normal. 3. The mitral valve is normal in structure. No evidence of mitral valve regurgitation. No evidence of mitral stenosis. 4. The aortic valve is normal in structure. Aortic valve regurgitation is not visualized. No aortic stenosis is present. 5. The inferior vena cava is normal in size with greater than 50% respiratory variability, suggesting right atrial pressure of 3 mmHg.  Comparison(s): Echocardiogram done 01/19/23 showed an EF of 60-65%.  FINDINGS Left Ventricle: Left ventricular ejection fraction, by estimation, is 50 to 55%. The left ventricle has low normal function. The left ventricle has no regional wall motion abnormalities. The average left ventricular global longitudinal strain is -17.6 %. Strain was performed and the global longitudinal strain  is normal. The left ventricular  internal cavity size was normal in size. There is mild left ventricular hypertrophy. Left ventricular diastolic parameters are indeterminate.  Right Ventricle: The right ventricular size is normal. No increase in right ventricular wall thickness. Right ventricular systolic function is normal.  Left Atrium: Left atrial size was normal in size.  Right Atrium: Right atrial size was normal in size.  Pericardium: There is no evidence of pericardial effusion.  Mitral Valve: The mitral valve is normal in structure. No evidence of mitral valve regurgitation. No evidence of mitral valve stenosis.  Tricuspid Valve: The tricuspid valve is normal in structure. Tricuspid valve regurgitation is mild . No evidence of tricuspid stenosis.  Aortic Valve: The aortic valve is normal in structure. Aortic valve regurgitation is not visualized. No aortic stenosis is present. Aortic valve mean gradient measures 2.5 mmHg. Aortic valve peak gradient measures 3.8 mmHg. Aortic valve area, by VTI measures 3.17 cm.  Pulmonic Valve: The pulmonic valve was normal in structure. Pulmonic valve regurgitation is not visualized. No evidence of pulmonic stenosis.  Aorta: The aortic root is normal in size and structure.  Venous: The inferior vena cava is normal in size with greater than 50% respiratory variability, suggesting right atrial pressure of 3 mmHg.  IAS/Shunts: No atrial level shunt detected by color flow Doppler.   LEFT VENTRICLE PLAX 2D LVIDd:         5.30 cm     Diastology LVIDs:         4.10 cm     LV e' medial:    4.76 cm/s LV PW:         1.40 cm     LV E/e' medial:  20.3 LV IVS:        1.00 cm     LV e' lateral:   6.08 cm/s LVOT diam:     2.10 cm     LV E/e' lateral: 15.9 LV SV:         53 LV SV Index:   22          2D Longitudinal Strain LVOT Area:     3.46 cm    2D Strain GLS Avg:     -17.6 %  LV Volumes (MOD) LV vol d, MOD A2C: 68.1 ml LV vol d, MOD A4C: 53.2 ml LV vol s, MOD A2C: 37.4 ml LV  vol s, MOD A4C: 29.2 ml LV SV MOD A2C:     30.7 ml LV SV MOD A4C:     53.2 ml LV SV MOD BP:      24.9 ml  RIGHT VENTRICLE RV S prime:     7.02 cm/s TAPSE (M-mode): 1.1 cm  LEFT ATRIUM             Index        RIGHT ATRIUM           Index LA diam:        4.00 cm 1.63 cm/m   RA Area:     21.40 cm LA Vol (A2C):   62.5 ml 25.45 ml/m  RA Volume:   50.70 ml  20.65 ml/m LA Vol (A4C):   92.8 ml 37.79 ml/m LA Biplane Vol: 80.1 ml 32.62 ml/m AORTIC VALVE AV Area (Vmax):    3.04 cm AV Area (Vmean):   2.88 cm AV Area (VTI):     3.17 cm AV Vmax:           97.17 cm/s AV Vmean:  71.200 cm/s AV VTI:            0.167 m AV Peak Grad:      3.8 mmHg AV Mean Grad:      2.5 mmHg LVOT Vmax:         85.40 cm/s LVOT Vmean:        59.150 cm/s LVOT VTI:          0.153 m LVOT/AV VTI ratio: 0.92  AORTA Ao Root diam: 3.65 cm Ao Asc diam:  4.30 cm  MITRAL VALVE MV Area (PHT): 5.35 cm    SHUNTS MV Decel Time: 142 msec    Systemic VTI:  0.15 m MV E velocity: 96.62 cm/s  Systemic Diam: 2.10 cm MV A velocity: 43.67 cm/s MV E/A ratio:  2.21  Lamar Fitch MD Electronically signed by Lamar Fitch MD Signature Date/Time: 08/07/2023/5:02:26 PM    Final          ______________________________________________________________________________________________       Laboratory Data: High Sensitivity Troponin:   Recent Labs  Lab 12/01/23 1148 12/01/23 1341  TROPONINIHS 58* 59*     Chemistry Recent Labs  Lab 12/01/23 2241 12/02/23 0406 12/02/23 0737  NA 156* 154* 158*  K 3.8 3.8 3.7  CL 112* 116* 118*  CO2 28 27 29   GLUCOSE 206* 263* 150*  BUN 45* 43* 42*  CREATININE 2.94* 2.86* 2.81*  CALCIUM  8.8* 8.9 9.0  GFRNONAA 23* 24* 24*  ANIONGAP 16* 11 11    Recent Labs  Lab 12/01/23 1148  PROT 8.3*  ALBUMIN 3.8  AST 23  ALT 18  ALKPHOS 98  BILITOT 1.1   Lipids No results for input(s): CHOL, TRIG, HDL, LABVLDL, LDLCALC, CHOLHDL in the last 168  hours.  Hematology Recent Labs  Lab 12/01/23 1140 12/01/23 1148  WBC  --  5.5  RBC  --  6.80*  HGB 18.7*  18.7* 18.6*  HCT 55.0*  55.0* 55.9*  MCV  --  82.2  MCH  --  27.4  MCHC  --  33.3  RDW  --  16.4*  PLT  --  136*   Thyroid  No results for input(s): TSH, FREET4 in the last 168 hours.  BNP Recent Labs  Lab 12/01/23 1741  BNP 165.5*    DDimer No results for input(s): DDIMER in the last 168 hours.  Radiology/Studies:  DG Chest Port 1 View Result Date: 12/01/2023 CLINICAL DATA:  Weakness. EXAM: PORTABLE CHEST 1 VIEW COMPARISON:  04/08/2023 FINDINGS: Cardiomegaly is stable. Mediastinal contours are unchanged. No pulmonary edema. Minor bibasilar atelectasis. No confluent opacity. No pleural fluid. No pneumothorax. Right shoulder arthropathy. IMPRESSION: Stable cardiomegaly. Minor bibasilar atelectasis. Electronically Signed   By: Andrea Gasman M.D.   On: 12/01/2023 12:13     Assessment and Plan:  Longstanding persistent atrial fibrillation  - Patient had previously been treated with Multaq  while living in [place] .  This was discontinued in 04/2023 due to recurrent CHF exacerbations.  Appears he has been in atrial fibrillation since that time on review of EKGs and office notes. - Patient initially tachycardic.  Now, heart rate in the 70s-80s -Echo from 08/2023 showed EF 50-55%, no regional wall motion abnormalities, mild LVH, normal RV systolic function, no significant valvular abnormalities.   - Transition from IV diltiazem to p.o. Dilt 180 mg daily - Stop amlodipine  with addition of diltiazem  - Continue carvedilol  25 mg twice daily - Patient reports excellent compliance with Eliquis  5 mg twice daily. Does not seem to have  ever had a cardioversion. could be a decent candidate for cardioversion. Consider this as an outpatient   Chronic HFpEF - Echo from 08/2023 showed EF 50-55% - Diuretics currently held given AKI.  Suspect AKI due to dehydration from hyperosmolar  hyperglycemic state - Patient has been on jardiace 10 mg daily - hold while being treated for HHS, but can resume afterwards   - Monitor volume status closely while receiving diuretics  Acute on chronic CKD stage IIIb - Per chart review, appears that patient's baseline creatinine around 1.7-1.8.  Tells me he recently established with [facility].  Had renal ultrasound on 11/27/2023 that showed a 1.4 cm hypoechoic mass in the upper pole of the right kidney, possible cyst but recommending CT or MRI for better evaluation.  There was also mild increased echogenicity of the renal cortices without significant thinning, consistent with chronic renal disease - Creatinine as high as 3.0 on arrival.  Has improved slightly to 2.81. - Continue IV fluids and hold diuretics   HTN  - BP currently well controlled  - Stop amlodipine  as patient now on diltiazem  - Continue imdur  120 mg daily, losartan 25 mg daily (started by nephrology), diltiazem 180 mg daily, carvedilol  25 mg BID  - Was also on hydralazine  PTA. BP currently well controlled without   History of CVA  - Continue lipitor  80 mg daily, eliquis  5 mg BID   Otherwise per primary  - Type 2 DM, HHS    Risk Assessment/Risk Scores:  CHA2DS2-VASc Score = 5   This indicates a 7.2% annual risk of stroke. The patient's score is based upon: CHF History: 1 HTN History: 1 Diabetes History: 1 Stroke History: 2 Vascular Disease History: 0 Age Score: 0 Gender Score: 0    For questions or updates, please contact [facility] Please consult [email or web address] for contact info under   Signed, Rollo FABIENE Louder, PA-C  12/02/2023 10:22 AM  I have seen and examined the patient along with Rollo FABIENE Louder, PA-C .  I have reviewed the chart, notes and new data.  I agree with PA/NP's note.  Key new complaints: Denies any dyspnea.  Lying completely flat in bed without respiratory difficulty.  Denies pain.  Has had prodigious urination all  day and all night at home. Key examination changes: Severely obese, no jugular venous distention, clear lungs, no peripheral edema, mucosal membranes continue to look rather dry, irregular rhythm, rate controlled Key new findings / data: Slight improvement in creatinine, but still way above baseline creatinine of around 1.6-1.8.  Markedly hyponatremic and severely hyperosmolar. Although his echocardiogram was interpreted as normal atrial size, he actually has moderate biatrial dilation on my review.  PLAN: Continue IV fluids to correct hyperosmolar state. Needs aggressive correction of hyperglycemia.  Dehydration secondary to excessive glucosuria in the setting of hyperglycemia and SGLT2 inhibitor use.  Continue to hold the Jardiance  until he is volume replete and is no longer on IV insulin . Ideally, in the long-term majority of glucose control will be achieved with SGLT2 inhibitor and GLP-1 agonist.  Avoid Actos. Amlodipine  replaced with diltiazem.  Transition from IV diltiazem to oral diltiazem. Continue Eliquis .   Undrea Archbold, MD, FACC [facility] [phone] 12/02/2023, 12:21 PM

## 2023-12-02 NOTE — Plan of Care (Signed)
   Problem: Education: Goal: Ability to describe self-care measures that may prevent or decrease complications (Diabetes Survival Skills Education) will improve Outcome: Progressing

## 2023-12-02 NOTE — Inpatient Diabetes Management (Addendum)
 Inpatient Diabetes Program Recommendations  AACE/ADA: New Consensus Statement on Inpatient Glycemic Control   Target Ranges:  Prepandial:   less than 140 mg/dL      Peak postprandial:   less than 180 mg/dL (1-2 hours)      Critically ill patients:  140 - 180 mg/dL   Lab Results  Component Value Date   GLUCAP 175 (H) 12/02/2023   HGBA1C 7.5 (H) 01/18/2023    Latest Reference Range & Units 12/02/23 06:59 12/02/23 08:03 12/02/23 09:01 12/02/23 10:10 12/02/23 11:19 12/02/23 12:41  Glucose-Capillary 70 - 99 mg/dL 832 (H) 843 (H) 869 (H) 176 (H) 199 (H) 175 (H)   Review of Glycemic Control  Diabetes history: DM2  Outpatient Diabetes medications:  Lantus 6 units daily  Ozempic 0.25mg  weekly  Jardiance  10mg  daily   Current orders for Inpatient glycemic control:  IV Insulin    Inpatient Diabetes Program Recommendations:   A1C pending.   Orders placed by MD to transition from IV Insulin  to Semglee 15 units daily and Novolog  0-15 units TID.   Sent to bedside RN a reminder to administer Semglee 2 hours prior to discontinuing IV Insulin .   Diabetes Coordinator will continue to follow.   Lavanda Search, RN, MSN, [facility]  Inpatient Diabetes Coordinator  Pager [phone] (8a-5p)

## 2023-12-03 ENCOUNTER — Telehealth: Payer: Self-pay | Admitting: Podiatry

## 2023-12-03 DIAGNOSIS — I480 Paroxysmal atrial fibrillation: Secondary | ICD-10-CM | POA: Diagnosis not present

## 2023-12-03 LAB — BASIC METABOLIC PANEL WITH GFR
Anion gap: 11 (ref 5–15)
BUN: 40 mg/dL — ABNORMAL HIGH (ref 8–23)
CO2: 28 mmol/L (ref 22–32)
Calcium: 9 mg/dL (ref 8.9–10.3)
Chloride: 115 mmol/L — ABNORMAL HIGH (ref 98–111)
Creatinine, Ser: 2.87 mg/dL — ABNORMAL HIGH (ref 0.61–1.24)
GFR, Estimated: 24 mL/min — ABNORMAL LOW (ref >60)
Glucose, Bld: 238 mg/dL — ABNORMAL HIGH (ref 70–99)
Potassium: 3.8 mmol/L (ref 3.5–5.1)
Sodium: 154 mmol/L — ABNORMAL HIGH (ref 135–145)

## 2023-12-03 LAB — GLUCOSE, CAPILLARY
Glucose-Capillary: 392 mg/dL — ABNORMAL HIGH (ref 70–99)
Glucose-Capillary: 295 mg/dL — ABNORMAL HIGH (ref 70–99)
Glucose-Capillary: 254 mg/dL — ABNORMAL HIGH (ref 70–99)
Glucose-Capillary: 246 mg/dL — ABNORMAL HIGH (ref 70–99)

## 2023-12-03 LAB — CBC
HCT: 53 % — ABNORMAL HIGH (ref 39.0–52.0)
Hemoglobin: 16.9 g/dL (ref 13.0–17.0)
MCH: 26.9 pg (ref 26.0–34.0)
MCHC: 31.9 g/dL (ref 30.0–36.0)
MCV: 84.4 fL (ref 80.0–100.0)
Platelets: 122 K/uL — ABNORMAL LOW (ref 150–400)
RBC: 6.28 MIL/uL — ABNORMAL HIGH (ref 4.22–5.81)
RDW: 17.5 % — ABNORMAL HIGH (ref 11.5–15.5)
WBC: 6.8 K/uL (ref 4.0–10.5)
nRBC: 0 % (ref 0.0–0.2)

## 2023-12-03 LAB — MAGNESIUM: Magnesium: 2.6 mg/dL — ABNORMAL HIGH (ref 1.7–2.4)

## 2023-12-03 MED ORDER — DILTIAZEM HCL ER COATED BEADS 120 MG PO CP24
120.0000 mg | ORAL_CAPSULE | Freq: Once | ORAL | Status: AC
  Administered 2023-12-03: 120 mg via ORAL
  Filled 2023-12-03: qty 1

## 2023-12-03 MED ORDER — DILTIAZEM HCL ER COATED BEADS 180 MG PO CP24: 360.0000 mg | ORAL_CAPSULE | Freq: Once | ORAL | Status: DC

## 2023-12-03 MED ORDER — DILTIAZEM HCL ER COATED BEADS 180 MG PO CP24
300.0000 mg | ORAL_CAPSULE | Freq: Every day | ORAL | Status: DC
  Administered 2023-12-04 – 2023-12-06 (×3): 300 mg via ORAL
  Filled 2023-12-03 (×3): qty 1

## 2023-12-03 MED ORDER — INSULIN GLARGINE-YFGN 100 UNIT/ML ~~LOC~~ SOLN
20.0000 [IU] | SUBCUTANEOUS | Status: DC
  Administered 2023-12-03: 20 [IU] via SUBCUTANEOUS
  Filled 2023-12-03 (×2): qty 0.2

## 2023-12-03 MED ORDER — DILTIAZEM HCL ER COATED BEADS 180 MG PO CP24: 180.0000 mg | ORAL_CAPSULE | Freq: Once | ORAL | Status: DC

## 2023-12-03 MED ORDER — POLYETHYLENE GLYCOL 3350 17 G PO PACK
17.0000 g | PACK | Freq: Every day | ORAL | Status: DC | PRN
  Administered 2023-12-03: 17 g via ORAL
  Filled 2023-12-03: qty 1

## 2023-12-03 MED ORDER — CAPSAICIN 0.075 % EX CREA
TOPICAL_CREAM | Freq: Two times a day (BID) | CUTANEOUS | Status: DC | PRN
  Filled 2023-12-03: qty 57

## 2023-12-03 NOTE — Plan of Care (Signed)
   Problem: Education: Goal: Knowledge of General Education information will improve Description: Including pain rating scale, medication(s)/side effects and non-pharmacologic comfort measures Outcome: Progressing   Problem: Pain Managment: Goal: General experience of comfort will improve and/or be controlled Outcome: Progressing   Problem: Safety: Goal: Ability to remain free from injury will improve Outcome: Progressing

## 2023-12-03 NOTE — Assessment & Plan Note (Addendum)
 Na 154, which has remained stable. Most likely due to dehydration with HHS. Will continue to monitor. - AM BMP

## 2023-12-03 NOTE — Telephone Encounter (Signed)
 Wife, Tonya, called to ask if the patient had an appointment today. After I confirmed that the patient did have an appointment, she asked what time it was, stating that she knew he had one scheduled. When informed that I could not disclose any information because she is not listed on the DPR, she became agitated. She then stated that she was calling to report the patient was in the emergency department and asked somewhat hateful if that could be noted. I told her yes. She then asked me to please hold a minute, and upon returning to the call, said, "Thank you, have a good day," and disconnected.

## 2023-12-03 NOTE — Assessment & Plan Note (Addendum)
 Remains in Afib, HR to 130s while I was examining him. K 3.8, Mg 2.6. - Cardiology consulted, appreciate recommendations - Increase PO Diltiazem to 300 mg daily - Continue Carvedilol  25mg  BID - Continuous cardiac monitoring - Strict I's and O's, daily weights - Continue eliquis  5 mg - Holding home Torsemide  40mg  BID

## 2023-12-03 NOTE — Assessment & Plan Note (Addendum)
 HTN - continue home Losartan 25 mg daily, Imdur  120 mg daily, Doxazosin  8 mg; holding home Hydralazine  100 mg TID as BP remains well-controlled without it; stopped home Amlodipine  10 mg daily per cardiology HLD - continue Lipitor  80 mg every day

## 2023-12-03 NOTE — Progress Notes (Addendum)
 Progress Note  Patient Name: Justin Molina. Date of Encounter: 12/03/2023 [facility] Cardiologist: Lonni LITTIE Nanas, MD   Interval Summary   Reported feeling better today. Denied chest pain and shortness of breath.   Vital Signs Vitals:   12/02/23 1604 12/02/23 2038 12/02/23 2341 12/03/23 0357  BP: 121/82 133/86 (!) 131/97 134/87  Pulse: 99 85 73 97  Resp:  20 15 20   Temp: (!) 97.5 F (36.4 C) 98.8 F (37.1 C) 99.2 F (37.3 C) 98.3 F (36.8 C)  TempSrc: Oral Oral Oral Oral  SpO2:  97% 96% 94%  Weight:      Height:        Intake/Output Summary (Last 24 hours) at 12/03/2023 0837 Last data filed at 12/03/2023 0640 Gross per 24 hour  Intake 240 ml  Output 1400 ml  Net -1160 ml      12/01/2023    9:50 PM 12/01/2023   11:01 AM 09/23/2023    1:43 PM  Last 3 Weights  Weight (lbs) 269 lb 13.5 oz 284 lb 6.3 oz 285 lb  Weight (kg) 122.4 kg 129 kg 129.275 kg      Telemetry/ECG  AF/AFL, HR progressive elevating this am avg ~105 though during interview ~130 - Personally Reviewed  Physical Exam  GEN: No acute distress.   Cardiac: Irregularly, irregular, no murmurs, rubs, or gallops.  Respiratory: Clear to auscultation bilaterally. GI: Soft, nontender, non-distended  MS: No edema  Assessment & Plan  Justin Nanna. is a 63 y.o. male with a hx of paroxysmal atrial fibrillation, HFpEF, history CVA x 2, resistant hypertension, PFO, OSA, HLD, type 2 diabetes, CKD who presented to the ED on 10/26 for confusion and hyperglycemia after running out of insulin  for several days. Patient found to be in AF RVR on EMS arrival. On ED arrival AF RVR HR 124 and BG 605. Patient was started on IV fluids, insulin , and dilt. Patient admitted to IM service for HHS. Cardiology consulted.   Longstanding persistent atrial fibrillation.  Transitioned yesterday from IV to PO dilt.  Patient remains in coarse AF with possible AFL episodes. Rates ~105 though ~130 during  interview.  Chad Vasc Score 5  Asymptomatic with RVR, will consider increasing dilt later this afternoon. He has not received his am medications yet and has only received one dose of oral dilt since transition, would like to see his heart rate after medication administration prior to titration. He has not had a DCCV previously, could consider pursuing though I wonder as he has been in AF for some time if he is now in permanent AF and his rates are more physiologic and reactive to his HHS.   Continue dilt 180 mg Continue coreg  25 mg BID Continue Eliqius 5mg  BID  Chronic HFpEF Seen recently by AHF team, not a candidate for advanced therapies.  On admission no evidence of volume overload.  On exam appears, euvolemic  Continue coreg  as above Continue losartan as below Jardiance  on hold 2/2 dehydration, AKI on CKD, and HHS. Defer MRA 2/2 renal dysfunction  Hypertension BP: 134/87 PTA amlodipine  dc 2/2 dilt initiation. PTA hydralazine  on hold, agree as BP continues to remain well controlled  Continue imdur  120 mg Continue losartan 25 mg Conitnue dilt as above Continue coreg  as above  Per primary Hx of CVA Hypernatremia Hypermagnesemia T2DM with hyperglycemia/ HHS AKI on CKD CKD OSA    For questions or updates, please contact [facility] Please consult [email or web address] for contact info under  Signed, Justin LOISE Salen, PA-C   I have seen and examined the patient along with Justin LOISE Salen, PA-C .  I have reviewed the chart, notes and new data.  I agree with PA/NP's note.  Key new complaints: More alert and interactive today.  Has expressive aphasia but was able to recall and speak his date of birth. Key examination changes: No signs of hypervolemia.  Remains in atrial fibrillation with variable AV block and moderate RVR. Key new findings / data: Remains markedly hypernatremic/hyperosmolar, although glucose control is substantially improved.  PLAN: Remains free  water deficient/hyperosmolar.  Encouraged drinking more water. Increase the dose of diltiazem. Continue carvedilol  and Eliquis . SGLT2 inhibitor currently on hold, but in the long run is a good option for him. GLP-1 agonist is also likely to be very beneficial.  Should not be given Actos. Would consider MRA with caution since his GFR is probably around 20.  Would wait until his electrolyte imbalances have stabilized. Although his echocardiogram report states that he has normal atrial sizes, on my review he has moderate biatrial dilation.  He has been in atrial fibrillation probably for a very lengthy period of time.  We would reevaluate potential for return to sinus rhythm as an outpatient, after complete resolution of the current acute illness.  Marchello Rothgeb, MD, FACC [facility] [phone] 12/03/2023, 11:11 AM

## 2023-12-03 NOTE — Assessment & Plan Note (Addendum)
 Fasting blood glucose remains elevated (761-607). Patient transitioned off endotool yesterday evening so will increase basal insulin  for now and monitor. Can increase more tomorrow if needed.  - Increase basal to 20 units, moderate SSI - AM BMP, Mg - Hgb A1c 9.4 - Holding home Jardiance  10mg  daily - PT/OT eval

## 2023-12-03 NOTE — Evaluation (Signed)
 Physical Therapy Evaluation Patient Details Name: Justin Molina. MRN: [id number] DOB: 05-Jan-1961 Today's Date: 12/03/2023  History of Present Illness  The pt is a 63 yo male presenting 10/26 with hyperglycemia, work up revealed afib with RVR to 177 on EMS arrival. PMH includes: CVA with R-sided deficits and aphasia, HTN, DM II, CKD, HLD, and CHF.  Clinical Impression  Pt in bed upon arrival of PT, agreeable to evaluation at this time. Prior to admission the pt was mobilizing without assistance but use of hemiwalker. The pt lives in a condo with his spouse, receives some assist for bathing but is otherwise fairly independent with mobility and ADLs without hx of falls. The pt currently requires increased assist to complete all aspects of mobility and was limited to small lateral steps for transfer to and from [facility] and recliner. The pt benefits from assist at trunk for wt shift to advance RLE, but pt able to advance RLE through use of circumduction and increased hip flexion for small steps. Pt currently needing more assist than wife is able to provide as pt needing assist of 2 to safely complete transfers, will attempt to progress strength, power, stability, and endurance to allow pt to return home with family assist.          If plan is discharge home, recommend the following: Two people to help with walking and/or transfers;Two people to help with bathing/dressing/bathroom   Can travel by private vehicle   No    Equipment Recommendations None recommended by PT  Recommendations for Other Services       Functional Status Assessment Patient has had a recent decline in their functional status and demonstrates the ability to make significant improvements in function in a reasonable and predictable amount of time.     Precautions / Restrictions Precautions Precautions: Fall Recall of Precautions/Restrictions: Intact Precaution/Restrictions Comments: R hemi, aphasia Restrictions Weight  Bearing Restrictions Per Provider Order: No      Mobility  Bed Mobility Overal bed mobility: Needs Assistance Bed Mobility: Supine to Sit     Supine to sit: Mod assist, Min assist, +2 for physical assistance     General bed mobility comments: modA for LE movement and trunk elevation, pt assisting to scoot to EOB but unable to complete without minA    Transfers Overall transfer level: Needs assistance Equipment used: 2 person hand held assist Transfers: Sit to/from Stand, Bed to chair/wheelchair/BSC Sit to Stand: Mod assist, +2 physical assistance, Max assist   Step pivot transfers: Mod assist, +2 physical assistance       General transfer comment: mod-maxA pulling on therapists to rise to standing, strong posterior lean with no anterior wt shift until standing. once standing, modA to steady and manage small lateral steps. R knee hyperextended, no buckling. bed-chair, chair-BSC, BSC-chair    Ambulation/Gait Ambulation/Gait assistance: Mod assist, +2 physical assistance Gait Distance (Feet): 3 Feet (x3) Assistive device: 2 person hand held assist Gait Pattern/deviations: Step-to pattern, Decreased stride length, Decreased stance time - right, Knee hyperextension - right, Decreased dorsiflexion - right, Decreased weight shift to right, Knee flexed in stance - right       General Gait Details: small lateral steps with modA of 2 to facilitate. no overt buckling but R knee hyperextended     Balance Overall balance assessment: Needs assistance Sitting-balance support: No upper extremity supported, Feet supported Sitting balance-Leahy Scale: Fair     Standing balance support: Bilateral upper extremity supported, During functional activity Standing balance-Leahy Scale: Poor  Standing balance comment: dependent on UE support and trunk support                             Pertinent Vitals/Pain Pain Assessment Pain Assessment: No/denies pain    Home Living  Family/patient expects to be discharged to:: Private residence Living Arrangements: Spouse/significant other Available Help at Discharge: Family;Available 24 hours/day Type of Home: Apartment Home Access: Level entry       Home Layout: One level Home Equipment: Tub bench;Wheelchair - manual;BSC/3in1;Wheelchair - power;Hospital bed;Other (comment) (hemiwalker, R AFO)      Prior Function Prior Level of Function : Independent/Modified Independent             Mobility Comments: ModI using hemiwalker, able to manage standing balance without UE support. no falls, not active with PT currently ADLs Comments: independent with dressing, assist from spouse for bathing, pt does laundry     Extremity/Trunk Assessment   Upper Extremity Assessment Upper Extremity Assessment: Defer to OT evaluation;RUE deficits/detail RUE Deficits / Details: chronic R hemi with decreased extension in grip, limited ROM throughout with no active movement noted    Lower Extremity Assessment Lower Extremity Assessment: RLE deficits/detail RLE Deficits / Details: no active DF (pt typically with AFO but does not currently have), grossly 2-/5 for knee flexion and hip flexion, 2+/5 for abd/adduction. RLE Sensation: history of peripheral neuropathy    Cervical / Trunk Assessment Cervical / Trunk Assessment: Kyphotic  Communication   Communication Communication: Impaired Factors Affecting Communication: Difficulty expressing self    Cognition Arousal: Alert Behavior During Therapy: WFL for tasks assessed/performed   PT - Cognitive impairments: Difficult to assess Difficult to assess due to: Impaired communication                     PT - Cognition Comments: pt with hx of aphasia, able to answer some simple questions, wife reports answers inconsistently Following commands: Impaired Following commands impaired: Follows one step commands inconsistently, Follows one step commands with increased  time     Cueing Cueing Techniques: Verbal cues, Gestural cues, Tactile cues     General Comments General comments (skin integrity, edema, etc.): HR in afib rhythm, 105-125bpm, VSS on RA, wife present and supportive        Assessment/Plan    PT Assessment Patient needs continued PT services  PT Problem List Decreased strength;Decreased activity tolerance;Decreased balance;Decreased mobility;Decreased coordination;Obesity       PT Treatment Interventions DME instruction;Gait training;Stair training;Functional mobility training;Therapeutic activities;Therapeutic exercise;Balance training;Patient/family education    PT Goals (Current goals can be found in the Care Plan section)  Acute Rehab PT Goals Patient Stated Goal: per wife to return home PT Goal Formulation: With patient/family Time For Goal Achievement: 12/17/23 Potential to Achieve Goals: Good    Frequency Min 2X/week        AM-PAC PT 6 Clicks Mobility  Outcome Measure Help needed turning from your back to your side while in a flat bed without using bedrails?: A Lot Help needed moving from lying on your back to sitting on the side of a flat bed without using bedrails?: A Lot Help needed moving to and from a bed to a chair (including a wheelchair)?: Total Help needed standing up from a chair using your arms (e.g., wheelchair or bedside chair)?: Total Help needed to walk in hospital room?: Total Help needed climbing 3-5 steps with a railing? : Total 6 Click Score: 8  End of Session Equipment Utilized During Treatment: Gait belt Activity Tolerance: Patient tolerated treatment well Patient left: in chair;with call bell/phone within reach;with chair alarm set;with family/visitor present Nurse Communication: Mobility status;Need for lift equipment (stedy) PT Visit Diagnosis: Unsteadiness on feet (R26.81);Other abnormalities of gait and mobility (R26.89);Muscle weakness (generalized) (M62.81);Difficulty in walking, not  elsewhere classified (R26.2);Hemiplegia and hemiparesis Hemiplegia - Right/Left: Right Hemiplegia - caused by:  (chronic)    Time: 1112-1205 PT Time Calculation (min) (ACUTE ONLY): 53 min   Charges:   PT Evaluation $PT Eval Moderate Complexity: 1 Mod PT Treatments $Therapeutic Exercise: 8-22 mins $Therapeutic Activity: 23-37 mins PT General Charges $$ ACUTE PT VISIT: 1 Visit         Izetta Call, PT, DPT   Acute Rehabilitation Department Office [phone] Secure Chat Communication Preferred  Izetta JULIANNA Call 12/03/2023, 1:32 PM

## 2023-12-03 NOTE — Progress Notes (Signed)
 Daily Progress Note Intern Pager: [phone]  Patient name: Justin Molina. Medical record number: [id number] Date of birth: 01/14/1961 Age: 63 y.o. Gender: male  Primary Care Provider: [facility] Consultants: Cardiology Code Status: Full code  Pt Overview and Major Events to Date:  10/26 - Admitted  Assessment and Plan:  Justin Catha Raddle. is a 63 y.o.male with PMHx T2DM, HTN, CKD, HLD, aphasia, hemiplegia and hemiparesis affecting the right side, OSA, A-fib, CHF (EF 50-55%), BPH, CAD who was admitted for HHS secondary to insulin  non-compliance and also found to have A-fib with RVR. Continuing diltiazem for rate control and adjusting insulin  for glucose control.   Itching sensation likely 2/2 neuropathy or continued bed rest. PT/OT ordered for today. Normal sensation in both LE. Will continue to monitor for any changes. Assessment & Plan Hyperosmolar hyperglycemic state (HHS) (HCC) Fasting blood glucose remains elevated (761-607). Patient transitioned off endotool yesterday evening so will increase basal insulin  for now and monitor. Can increase more tomorrow if needed.  - Increase basal to 20 units, moderate SSI - AM BMP, Mg - Hgb A1c 9.4 - Holding home Jardiance  10mg  daily - PT/OT eval Hypernatremia Na 154, which has remained stable. Most likely due to dehydration with HHS. Will continue to monitor. - AM BMP Paroxysmal A-fib (HCC) Atrial fibrillation with RVR (HCC) Chronic heart failure with preserved ejection fraction (HFpEF, >= 50%) (HCC) Remains in Afib, HR to 130s while I was examining him. K 3.8, Mg 2.6. - Cardiology consulted, appreciate recommendations - Increase PO Diltiazem to 300 mg daily - Continue Carvedilol  25mg  BID - Continuous cardiac monitoring - Strict I's and O's, daily weights - Continue eliquis  5 mg - Holding home Torsemide  40mg  BID Chronic health problem HTN - continue home Losartan 25 mg daily, Imdur  120 mg daily, Doxazosin  8  mg; holding home Hydralazine  100 mg TID as BP remains well-controlled without it; stopped home Amlodipine  10 mg daily per cardiology HLD - continue Lipitor  80 mg every day  FEN/GI: Carb modified PPx: Eliquis  Dispo:Home pending clinical improvement .  Subjective:  Patient states he is doing well. Per wife at bedside, patient reported an itching sensation in his legs around 5pm last night and facial reaction. Per patient, his legs were itchy and he had an urge to move his legs. Denies any pain, numbness, weakness or tingling. Nothing like this has ever happened before.  Objective: Temp:  [97.3 F (36.3 C)-99.2 F (37.3 C)] 98.3 F (36.8 C) (10/28 0357) Pulse Rate:  [71-99] 97 (10/28 0357) Resp:  [15-28] 20 (10/28 0357) BP: (112-134)/(82-97) 134/87 (10/28 0357) SpO2:  [93 %-97 %] 94 % (10/28 0357)  Physical Exam: General: Alert, well-appearing male in NAD.  Cardiovascular: Irregular rhythm, tachycardic, no m/r/g appreciated. Radial/pedal pulse +2 bilaterally Pulmonary: Normal WOB. CTAB with no w/c/r present.  Extremities: Warm and well-perfused   Laboratory: Most recent CBC Lab Results  Component Value Date   WBC 6.8 12/03/2023   HGB 16.9 12/03/2023   HCT 53.0 (H) 12/03/2023   MCV 84.4 12/03/2023   PLT 122 (L) 12/03/2023   Most recent BMP    Latest Ref Rng & Units 12/03/2023    4:09 AM  BMP  Glucose 70 - 99 mg/dL 761   BUN 8 - 23 mg/dL 40   Creatinine 9.38 - 1.24 mg/dL 7.12   Sodium 864 - 854 mmol/L 154   Potassium 3.5 - 5.1 mmol/L 3.8   Chloride 98 - 111 mmol/L 115   CO2  22 - 32 mmol/L 28   Calcium  8.9 - 10.3 mg/dL 9.0     Other pertinent labs: Mg 2.6   Imaging/Diagnostic Tests: No new imaging  Jerrie Gathers, DO 12/03/2023, 7:50 AM  PGY-63, [facility] FPTS Intern pager: [phone], text pages welcome Secure chat group [facility] Teaching Service

## 2023-12-03 NOTE — TOC Initial Note (Addendum)
 Transition of Care (TOC) - Initial/Assessment Note    Patient Details  Name: Justin Molina. MRN: [id number] Date of Birth: 1960/05/19  [facility]) CM/SW Contact:    Justin Molina, LCSWA Phone Number: 12/03/2023, 3:30 PM  Clinical Narrative:                  CSW received consult for possible SNF placement at time of discharge. Due to patients current orientation (only oriented x2) CSW spoke with patients spouse Justin Molina regarding PT recommendation of SNF placement for patient at time of discharge. Patients spouse reports PTA patient comes from home with her. Patients spouse expressed understanding of PT recommendation and is agreeable to SNF placement for patient at time of discharge. Patients spouse reports preference for [facility] . Patients spouse gave CSW permission to fax out initial referral for SNF placement.CSW discussed insurance authorization process.  All questions answered. No further questions reported at this time. CSW to continue to follow and assist with patients discharge planning needs.   Update- Justin Molina with [facility] informed CSW that facility does not accept [employer].  Expected Discharge Plan: Skilled Nursing Facility Barriers to Discharge: Continued Medical Work up   Patient Goals and CMS Choice   CMS Medicare.gov Compare Post Acute Care list provided to:: Other (Comment Required) (patients spouse) Choice offered to / list presented to : Spouse      Expected Discharge Plan and Services In-house Referral: Clinical Social Work     Living arrangements for the past 2 months: Apartment                                      Prior Living Arrangements/Services Living arrangements for the past 2 months: Apartment Lives with:: Spouse Patient language and need for interpreter reviewed:: Yes        Need for Family Participation in Patient Care: Yes (Comment) Care giver support system in place?: Yes (comment)   Criminal Activity/Legal  Involvement Pertinent to Current Situation/Hospitalization: No - Comment as needed  Activities of Daily Living   ADL Screening (condition at time of admission) Independently performs ADLs?: No Does the patient have a NEW difficulty with bathing/dressing/toileting/self-feeding that is expected to last >3 days?: No Does the patient have a NEW difficulty with getting in/out of bed, walking, or climbing stairs that is expected to last >3 days?: No Does the patient have a NEW difficulty with communication that is expected to last >3 days?: No Is the patient deaf or have difficulty hearing?: No Does the patient have difficulty seeing, even when wearing glasses/contacts?: No Does the patient have difficulty concentrating, remembering, or making decisions?: Yes  Permission Sought/Granted Permission sought to share information with : Case Manager, Magazine Features Editor, Family Supports                Emotional Assessment       Orientation: : Oriented to Self, Oriented to Place Alcohol  / Substance Use: Not Applicable Psych Involvement: No (comment)  Admission diagnosis:  Hypernatremia [E87.0] Hyperglycemia [R73.9] AKI (acute kidney injury) [N17.9] Atrial fibrillation with RVR (HCC) [I48.91] Paroxysmal A-fib (HCC) [I48.0] Patient Active Problem List   Diagnosis Date Noted   Paroxysmal A-fib (HCC) 12/01/2023   Atrial fibrillation with RVR (HCC) 12/01/2023   Hyperosmolar hyperglycemic state (HHS) (HCC) 12/01/2023   Chronic health problem 12/01/2023   Hypernatremia 12/01/2023   Essential hypertension 04/12/2023   Persistent atrial  fibrillation (HCC) 04/11/2023   Acute CHF (congestive heart failure) (HCC) 04/09/2023   Class 2 obesity 03/19/2023   Open wound of right lower extremity 03/19/2023   Migraine headaches 03/19/2023   Acute on chronic diastolic CHF (congestive heart failure) (HCC) 03/18/2023   Sepsis (HCC) 01/17/2023   Cellulitis 01/17/2023   Hypokalemia  12/27/2022   Chronic heart failure with preserved ejection fraction (HFpEF, >= 50%) (HCC) 12/12/2022   Benign prostatic hyperplasia 01/05/2022   Coronary atherosclerosis 12/14/2021   Constipation 12/14/2021   Unspecified dementia, unspecified severity, without behavioral disturbance, psychotic disturbance, mood disturbance, and anxiety (HCC) 05/01/2021   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   Hemiplegia and hemiparesis following cerebral infarction affecting right dominant side (HCC) 01/04/2021   Aphasia following cerebral infarction 01/04/2021   Atrial fibrillation, chronic (HCC) 01/04/2021   Class 3 severe obesity due to excess calories with body mass index (BMI) of 45.0 to 49.9 in adult (HCC) 10/03/2017   Aphasia 07/02/2017   OSA (obstructive sleep apnea) 01/16/2016   CKD (chronic kidney disease) stage 3, GFR 30-59 ml/min (HCC) 08/24/2014   HLD (hyperlipidemia) 08/24/2014   Anxiety 08/24/2014   Type 2 diabetes mellitus with hyperlipidemia (HCC) 08/12/2014   Diabetic neuropathy (HCC) 08/12/2014   HTN (hypertension) 08/12/2014   History of CVA with residual deficit 08/12/2014   PCP:  [facility]:   [id number] - [place], [place] - [address] [place] [address] Phone: [phone] Fax: [phone]     Social Drivers of Health (SDOH) Social History: SDOH Screenings   Food Insecurity: Food Insecurity Present (12/01/2023)  Housing: Low Risk  (12/01/2023)  Transportation Needs: No Transportation Needs (12/01/2023)  Utilities: At Risk (12/01/2023)  Tobacco Use: Low Risk  (12/01/2023)   SDOH Interventions:     Readmission Risk Interventions    04/10/2023    4:30 PM  Readmission Risk Prevention Plan  Transportation Screening Complete  PCP or Specialist Appt within 3-5 Days Complete  HRI or Home Care Consult Complete  Palliative Care Screening Not  Applicable  Medication Review (RN Care Manager) Complete

## 2023-12-03 NOTE — [place] FL2 (Addendum)
 [place]  MEDICAID FL2 LEVEL OF CARE FORM     IDENTIFICATION  Patient Name: Justin Molina. Birthdate: 1960-08-19 Sex: male Admission Date (Current Location): 12/01/2023  [facility] and [place] Number:  Producer, Television/film/video and Address:  [facility], [address], [place], [place] [address]      Provider Number: [id number]  Attending Physician Name and Address:  Orie Milda CROME, MD  Relative Name and Phone Number:  Tommy Goostree (spouse) [phone]    Current Level of Care: Hospital Recommended Level of Care: Skilled Nursing Facility Prior Approval Number:    Date Approved/Denied:   PASRR Number: [id number]  Discharge Plan: SNF    Current Diagnoses: Patient Active Problem List   Diagnosis Date Noted   Paroxysmal A-fib (HCC) 12/01/2023   Atrial fibrillation with RVR (HCC) 12/01/2023   Hyperosmolar hyperglycemic state (HHS) (HCC) 12/01/2023   Chronic health problem 12/01/2023   Hypernatremia 12/01/2023   Essential hypertension 04/12/2023   Persistent atrial fibrillation (HCC) 04/11/2023   Acute CHF (congestive heart failure) (HCC) 04/09/2023   Class 2 obesity 03/19/2023   Open wound of right lower extremity 03/19/2023   Migraine headaches 03/19/2023   Acute on chronic diastolic CHF (congestive heart failure) (HCC) 03/18/2023   Sepsis (HCC) 01/17/2023   Cellulitis 01/17/2023   Hypokalemia 12/27/2022   Chronic heart failure with preserved ejection fraction (HFpEF, >= 50%) (HCC) 12/12/2022   Benign prostatic hyperplasia 01/05/2022   Coronary atherosclerosis 12/14/2021   Constipation 12/14/2021   Unspecified dementia, unspecified severity, without behavioral disturbance, psychotic disturbance, mood disturbance, and anxiety (HCC) 05/01/2021   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   Hemiplegia and hemiparesis following cerebral infarction affecting right dominant side (HCC) 01/04/2021   Aphasia following cerebral  infarction 01/04/2021   Atrial fibrillation, chronic (HCC) 01/04/2021   Class 3 severe obesity due to excess calories with body mass index (BMI) of 45.0 to 49.9 in adult (HCC) 10/03/2017   Aphasia 07/02/2017   OSA (obstructive sleep apnea) 01/16/2016   CKD (chronic kidney disease) stage 3, GFR 30-59 ml/min (HCC) 08/24/2014   HLD (hyperlipidemia) 08/24/2014   Anxiety 08/24/2014   Type 2 diabetes mellitus with hyperlipidemia (HCC) 08/12/2014   Diabetic neuropathy (HCC) 08/12/2014   HTN (hypertension) 08/12/2014   History of CVA with residual deficit 08/12/2014    Orientation RESPIRATION BLADDER Height & Weight     Self, Place  Normal Incontinent Weight: 269 lb 13.5 oz (122.4 kg) Height:  5' 11 (180.3 cm)  BEHAVIORAL SYMPTOMS/MOOD NEUROLOGICAL BOWEL Continent  NUTRITION STATUS        Diet (Please see discharge summary)  AMBULATORY STATUS COMMUNICATION OF NEEDS Skin   Extensive Assist Verbally Other (Comment) (WDL, Wound/Incision LDAs)                       Personal Care Assistance Level of Assistance  Bathing, Feeding, Dressing Bathing Assistance: Maximum assistance  Feeding Assistance: Limited assistance Dressing Assistance: Maximum assistance     Functional Limitations Info  Sight, Hearing, Speech Sight Info: Adequate Hearing Info: Adequate Speech Info: Adequate    SPECIAL CARE FACTORS FREQUENCY  PT (By licensed PT), OT (By licensed OT)     PT Frequency: 5x min weekly OT Frequency: 5x min weekly            Contractures Contractures Info: Not present    Additional Factors Info  Code Status, Allergies, Insulin  Sliding Scale Code Status Info: FULL Allergies Info: Contrast Media (iodinated Contrast  Media),Iodine   Insulin  Sliding Scale Info: insulin  aspart (novoLOG ) injection 0-15 Units 3 times daily with meals,insulin  glargine-yfgn (SEMGLEE) injection 20 Units every 24 hours       Current Medications (12/03/2023):  This is the current hospital active  medication list Current Facility-Administered Medications  Medication Dose Route Frequency Provider Last Rate Last Admin   apixaban  (ELIQUIS ) tablet 5 mg  5 mg Oral BID Boyina, Akhila, MD   5 mg at 12/03/23 0911   atorvastatin  (LIPITOR ) tablet 80 mg  80 mg Oral QHS Boyina, Akhila, MD   80 mg at 12/02/23 2041   capsicum (ZOSTRIX) 0.075 % cream   Topical BID PRN Pruett, Milda CROME, MD       carvedilol  (COREG ) tablet 25 mg  25 mg Oral BID WC Boyina, Akhila, MD   25 mg at 12/03/23 0910   dextrose  50 % solution 0-50 mL  0-50 mL Intravenous PRN Nicholas Bar, MD       diltiazem (CARDIZEM CD) 24 hr capsule 180 mg  180 mg Oral Daily Johnson, Kathleen R, PA-C   180 mg at 12/03/23 0910   [START ON 12/04/2023] diltiazem (CARDIZEM CD) 24 hr capsule 300 mg  300 mg Oral Daily Croitoru, Mihai, MD       doxazosin  (CARDURA ) tablet 8 mg  8 mg Oral QHS Boyina, Akhila, MD   8 mg at 12/02/23 2042   insulin  aspart (novoLOG ) injection 0-15 Units  0-15 Units Subcutaneous TID WC Bhagat, Virali, DO   8 Units at 12/03/23 1240   insulin  glargine-yfgn (SEMGLEE) injection 20 Units  20 Units Subcutaneous Q24H Alena Morrison, Reagan, MD   20 Units at 12/03/23 1435   isosorbide  mononitrate (IMDUR ) 24 hr tablet 120 mg  120 mg Oral Daily Boyina, Akhila, MD   120 mg at 12/03/23 0910   losartan (COZAAR) tablet 25 mg  25 mg Oral Daily Boyina, Akhila, MD   25 mg at 12/03/23 0911   polyethylene glycol (MIRALAX  / GLYCOLAX ) packet 17 g  17 g Oral Daily PRN Pruett, Milda CROME, MD   17 g at 12/03/23 1249     Discharge Medications: Please see discharge summary for a list of discharge medications.  Relevant Imaging Results:  Relevant Lab Results:   Additional Information [id number]  Isaiah Public, LCSWA

## 2023-12-03 NOTE — Inpatient Diabetes Management (Signed)
 Inpatient Diabetes Program Recommendations  AACE/ADA: New Consensus Statement on Inpatient Glycemic Control (2015)  Target Ranges:  Prepandial:   less than 140 mg/dL      Peak postprandial:   less than 180 mg/dL (1-2 hours)      Critically ill patients:  140 - 180 mg/dL   Lab Results  Component Value Date   GLUCAP 295 (H) 12/03/2023   HGBA1C 9.4 (H) 12/01/2023    Review of Glycemic Control  Latest Reference Range & Units 12/02/23 14:52 12/02/23 15:57 12/02/23 20:49 12/03/23 08:39 12/03/23 12:07  Glucose-Capillary 70 - 99 mg/dL 823 (H) 835 (H) 719 (H) 392 (H) 295 (H)   Diabetes history: DM 2 Outpatient Diabetes medications:  Jardiance  10 mg daily Patients wife states he has not been taking insulin  or Ozempic at home Current orders for Inpatient glycemic control:  Novolog  0-15 units tid with meals Semglee 20 units daily Inpatient Diabetes Program Recommendations:    Spoke with patient's wife at bedside.  She states that patient was on Ozempic until about 3 months ago.  She has noticed that blood sugars have been high and she states that she has been trying to get MD to call in both Lantus and Ozempic.  She states that she finally got new prescription for Ozempic on Friday but patient has not restarted yet.  They also called in prescription for Lantus but she does not know what dose.  Reviewed chart and noted that in March of 2025- patient was discharged on Ozempic and Jardiance  (and Lantus was discontinued) due to blood sugars being fairly well controlled.  I explained to wife that patient not being on the Ozempic for the last few months likely contributed to increased blood sugars and need for insulin .  I reviewed CGM data on phone and verified that over the past 90 days blood sugars have averaged in the 200-300's. Will bring patient a new CGM for discharge as wife says they did not bring replacement sensor (his ran out today).    For Discharge, consider being conservative with basal  insulin  dose since patient is also restarting Ozempic.  Wife states that she wants a new PCP and is interested in getting referral for endocrinology.  Will follow up on 10/29.   Thanks,  Randall Bullocks, RN, BC-ADM Inpatient Diabetes Coordinator Pager [phone]  (8a-5p)

## 2023-12-04 DIAGNOSIS — I480 Paroxysmal atrial fibrillation: Secondary | ICD-10-CM | POA: Diagnosis not present

## 2023-12-04 LAB — BASIC METABOLIC PANEL WITH GFR
Anion gap: 12 (ref 5–15)
BUN: 38 mg/dL — ABNORMAL HIGH (ref 8–23)
CO2: 24 mmol/L (ref 22–32)
Calcium: 8.6 mg/dL — ABNORMAL LOW (ref 8.9–10.3)
Chloride: 116 mmol/L — ABNORMAL HIGH (ref 98–111)
Creatinine, Ser: 2.52 mg/dL — ABNORMAL HIGH (ref 0.61–1.24)
GFR, Estimated: 28 mL/min — ABNORMAL LOW (ref >60)
Glucose, Bld: 166 mg/dL — ABNORMAL HIGH (ref 70–99)
Potassium: 3.5 mmol/L (ref 3.5–5.1)
Sodium: 152 mmol/L — ABNORMAL HIGH (ref 135–145)

## 2023-12-04 LAB — GLUCOSE, CAPILLARY
Glucose-Capillary: 180 mg/dL — ABNORMAL HIGH (ref 70–99)
Glucose-Capillary: 222 mg/dL — ABNORMAL HIGH (ref 70–99)
Glucose-Capillary: 290 mg/dL — ABNORMAL HIGH (ref 70–99)
Glucose-Capillary: 217 mg/dL — ABNORMAL HIGH (ref 70–99)
Glucose-Capillary: 208 mg/dL — ABNORMAL HIGH (ref 70–99)

## 2023-12-04 LAB — MAGNESIUM: Magnesium: 2.4 mg/dL (ref 1.7–2.4)

## 2023-12-04 MED ORDER — INSULIN GLARGINE-YFGN 100 UNIT/ML ~~LOC~~ SOLN
24.0000 [IU] | Freq: Every day | SUBCUTANEOUS | Status: DC
  Administered 2023-12-04 – 2023-12-05 (×2): 24 [IU] via SUBCUTANEOUS
  Filled 2023-12-04 (×2): qty 0.24

## 2023-12-04 NOTE — Assessment & Plan Note (Addendum)
 HR improved with increase in Diltiazem. K 3.5, Mg 2.4. - Cardiology consulted, appreciate recommendations - Continue PO Diltiazem to 300 mg daily - Continue Carvedilol  25mg  BID - Continuous cardiac monitoring - Strict I's and O's, daily weights - Continue eliquis  5 mg - Holding home Torsemide  40mg  BID

## 2023-12-04 NOTE — Progress Notes (Signed)
 Daily Progress Note Intern Pager: [phone]  Patient name: Justin Molina. Medical record number: [id number] Date of birth: 1960-06-02 Age: 63 y.o. Gender: male  Primary Care Provider: [facility] Consultants: Cardiology Code Status: Full code  Pt Overview and Major Events to Date:  10/26 - Admitted   Assessment and Plan:  Charlie Catha Raddle. is a 63 y.o.male with PMHx T2DM, HTN, CKD, HLD, aphasia, hemiplegia and hemiparesis affecting the right side, OSA, A-fib, CHF (EF 50-55%), BPH, CAD who was admitted for HHS secondary to insulin  non-compliance and also found to have A-fib with RVR. Continuing diltiazem for rate control and adjusting insulin  for glucose control.  Assessment & Plan Hyperosmolar hyperglycemic state (HHS) (HCC) Fasting blood glucose improving but remains elevated (166-254). Per inpatient diabetes coordinator, patient was on Ozempic until about 3 months ago which is when his blood sugars started to rise. This was confirmed with CGM data. - Increase basal to 24 units, moderate SSI - AM BMP, Mg - Holding home Jardiance  10mg  daily - PT/OT eval Hypernatremia Na 152, which has remained stable. Most likely due to dehydration with HHS. Will continue to monitor. - AM BMP Paroxysmal A-fib (HCC) Atrial fibrillation with RVR (HCC) Chronic heart failure with preserved ejection fraction (HFpEF, >= 50%) (HCC) HR improved with increase in Diltiazem. K 3.5, Mg 2.4. - Cardiology consulted, appreciate recommendations - Continue PO Diltiazem to 300 mg daily - Continue Carvedilol  25mg  BID - Continuous cardiac monitoring - Strict I's and O's, daily weights - Continue eliquis  5 mg - Holding home Torsemide  40mg  BID Chronic health problem HTN - continue home Losartan 25 mg daily, Imdur  120 mg daily, Doxazosin  8 mg; holding home Hydralazine  100 mg TID as BP remains well-controlled without it; stopped home Amlodipine  10 mg daily per cardiology HLD - continue Lipitor   80 mg every day   FEN/GI: Carb modified PPx: Eliquis  Dispo:SNF pending clinical improvement . And SNF placement  Subjective:  Patient states he is doing well.  Denies any chest pain or shortness of breath.  Objective: Temp:  [97.2 F (36.2 C)-98.5 F (36.9 C)] 98.5 F (36.9 C) (10/29 0500) Pulse Rate:  [74-111] 85 (10/29 0500) Resp:  [20-23] 23 (10/28 1657) BP: (112-123)/(72-93) 119/82 (10/29 0500) SpO2:  [95 %-97 %] 97 % (10/29 0100) Weight:  [122.9 kg] 122.9 kg (10/29 0500)  Physical Exam: General: Alert, well-appearing male in NAD.  Cardiovascular: Irregular rhythm, no m/r/g appreciated. Hepatojugular reflux visualized Pulmonary: Normal WOB. CTAB with no w/c/r present. Abdomen: Soft, non-tender, non-distended. Extremities: Warm and well-perfused, trace pretibial edema   Laboratory: Most recent CBC Lab Results  Component Value Date   WBC 6.8 12/03/2023   HGB 16.9 12/03/2023   HCT 53.0 (H) 12/03/2023   MCV 84.4 12/03/2023   PLT 122 (L) 12/03/2023   Most recent BMP    Latest Ref Rng & Units 12/04/2023    4:10 AM  BMP  Glucose 70 - 99 mg/dL 833   BUN 8 - 23 mg/dL 38   Creatinine 9.38 - 1.24 mg/dL 7.47   Sodium 864 - 854 mmol/L 152   Potassium 3.5 - 5.1 mmol/L 3.5   Chloride 98 - 111 mmol/L 116   CO2 22 - 32 mmol/L 24   Calcium  8.9 - 10.3 mg/dL 8.6     Imaging/Diagnostic Tests: No new imaging  Jerrie Gathers, DO 12/04/2023, 7:36 AM  PGY-1, [facility] FPTS Intern pager: [phone], text pages welcome Secure chat group [facility]  Teaching Service

## 2023-12-04 NOTE — Progress Notes (Signed)
  Progress Note  Patient Name: Justin Molina. Date of Encounter: 12/04/2023 [facility] Cardiologist: Justin LITTIE Nanas, MD   Interval Summary   Feels well, no dyspnea, no edema. Appears alert and engaged, unchanged expressive aphasia. Ventricular rate is now well controlled. Improved creatinine and sodium levels.  Vital Signs Vitals:   12/03/23 1657 12/03/23 2015 12/04/23 0100 12/04/23 0500  BP: 119/79 123/87 115/87 119/82  Pulse:  74 88 85  Resp: (!) 23     Temp: (!) 97.4 F (36.3 C) (!) 97.2 F (36.2 C) (!) 97.4 F (36.3 C) 98.5 F (36.9 C)  TempSrc: Oral Oral Oral Oral  SpO2:  95% 97%   Weight:    122.9 kg  Height:        Intake/Output Summary (Last 24 hours) at 12/04/2023 0645 Last data filed at 12/04/2023 0519 Gross per 24 hour  Intake 240 ml  Output 3975 ml  Net -3735 ml      12/04/2023    5:00 AM 12/01/2023    9:50 PM 12/01/2023   11:01 AM  Last 3 Weights  Weight (lbs) 271 lb 269 lb 13.5 oz 284 lb 6.3 oz  Weight (kg) 122.925 kg 122.4 kg 129 kg      Telemetry/ECG  AFib 70-90s - Personally Reviewed  Physical Exam  GEN: No acute distress.   Neck: No JVD Cardiac: irregular, no murmurs, rubs, or gallops.  Respiratory: Clear to auscultation bilaterally. GI: Soft, nontender, non-distended  MS: No edema  Assessment & Plan  Justin Molina. is a 63 y.o. male with a hx of paroxysmal atrial fibrillation, HFpEF, history CVA x 2, resistant hypertension, PFO, OSA, HLD, type 2 diabetes, CKD who presented to the ED on 10/26 for confusion and hyperglycemia after running out of insulin  for several days. Patient found to be in AF RVR on EMS arrival. On ED arrival AF RVR HR 124 and BG 605. Patient was started on IV fluids, insulin , and dilt. Patient admitted to IM service for HHS. Cardiology consulted.    Longstanding persistent atrial fibrillation.  Patient is now rate controlled on the current doses of diltiazem and carvedilol . ChadsVasc  Score 5   Continue dilt 300 mg Continue coreg  25 mg BID Continue Eliqius 5mg  BID   Chronic HFpEF Seen recently by AHF team, not a candidate for advanced therapies.  Does not have any signs of hypervolemia. Current weight of 271 lb should probably be considered the lower bracket for his dry weight range.   Continue coreg  as above Continue losartan as below Jardiance  on hold 2/2 dehydration, AKI on CKD, and HHS. Defer MRA 2/2 renal dysfunction   Hypertension BP: 119/82 PTA amlodipine  dc 2/2 dilt initiation. PTA hydralazine  on hold, agree as BP continues to remain well controlled  Continue imdur  120 mg Continue losartan 25 mg Conitnue dilt as above Continue coreg  as above  Hyperlipidemia Continue lipitor  80 mg   AKI on CKD Improving, not yet at baseline  Hypernatremia Improving  Hypermagnesemia Resolved  T2DM with hyperglycemia/ HHS AKI on CKD CKD OSA CVA w residual expressive aphasia   For questions or updates, please contact [facility] Please consult [email or web address] for contact info under       Justin Balding, MD, [facility] [phone] office [phone] pager

## 2023-12-04 NOTE — Progress Notes (Signed)
 Physical Therapy Treatment Patient Details Name: Justin Molina. MRN: [id number] DOB: 1960-08-22 Today's Date: 12/04/2023   History of Present Illness The pt is a 63 yo male presenting 10/26 with hyperglycemia, work up revealed afib with RVR to 177 on EMS arrival. PMH includes: CVA with R-sided deficits and aphasia, HTN, DM II, CKD, HLD, and CHF.    PT Comments  The pt was agreeable to session, able to make good progress with ambulation when in hallway with fewer obstacles. Pt with good use of hemi-walker and demos improved standing balance both with and without UE support. Continues to require increased assist to rise to standing, will continue to benefit from skilled PT to progress functional strength and mobility for transfers to allow for eventual return home with wife assist.     If plan is discharge home, recommend the following: Two people to help with walking and/or transfers;Two people to help with bathing/dressing/bathroom   Can travel by private vehicle     No  Equipment Recommendations  None recommended by PT    Recommendations for Other Services       Precautions / Restrictions Precautions Precautions: Fall Recall of Precautions/Restrictions: Intact Precaution/Restrictions Comments: R hemi, aphasia Restrictions Weight Bearing Restrictions Per Provider Order: No     Mobility  Bed Mobility Overal bed mobility: Needs Assistance             General bed mobility comments: sitting on BSC upon arrival    Transfers Overall transfer level: Needs assistance Equipment used: Hemi-walker Transfers: Sit to/from Stand, Bed to chair/wheelchair/BSC Sit to Stand: Mod assist, +2 physical assistance   Step pivot transfers: Mod assist, +2 physical assistance       General transfer comment: modA of 2 to complete hip clearance and steady during rise, pt with posterior lean. improved with use of hemi-walker. modA to steady with lateral steps     Ambulation/Gait Ambulation/Gait assistance: +2 physical assistance, Min assist, +2 safety/equipment (chair follow) Gait Distance (Feet): 125 Feet Assistive device: Hemi-walker Gait Pattern/deviations: Step-to pattern, Decreased stride length, Decreased stance time - right, Knee hyperextension - right, Decreased dorsiflexion - right, Decreased weight shift to right, Knee flexed in stance - right Gait velocity: decreased Gait velocity interpretation: <1.31 ft/sec, indicative of household ambulator   General Gait Details: increased RLE circumduction, chronic R foot drop, no overt buckling and good ability to pace with HR stable in 80s   Stairs             Wheelchair Mobility     Tilt Bed    Modified Rankin (Stroke Patients Only)       Balance Overall balance assessment: Needs assistance Sitting-balance support: No upper extremity supported, Feet supported Sitting balance-Leahy Scale: Fair     Standing balance support: Bilateral upper extremity supported, During functional activity Standing balance-Leahy Scale: Poor Standing balance comment: dependent on UE support and trunk support                            Communication Communication Communication: Impaired Factors Affecting Communication: Difficulty expressing self  Cognition Arousal: Alert Behavior During Therapy: WFL for tasks assessed/performed   PT - Cognitive impairments: Difficult to assess Difficult to assess due to: Impaired communication                     PT - Cognition Comments: pt with hx of aphasia, able to answer some simple questions, wife reports answers inconsistently. follows  commands well Following commands: Impaired Following commands impaired: Follows one step commands with increased time    Cueing Cueing Techniques: Verbal cues, Gestural cues, Tactile cues  Exercises      General Comments General comments (skin integrity, edema, etc.): VSS on RA       Pertinent Vitals/Pain Pain Assessment Pain Assessment: No/denies pain     PT Goals (current goals can now be found in the care plan section) Acute Rehab PT Goals Patient Stated Goal: per wife to return home PT Goal Formulation: With patient/family Time For Goal Achievement: 12/17/23 Potential to Achieve Goals: Good Progress towards PT goals: Progressing toward goals    Frequency    Min 2X/week       AM-PAC PT 6 Clicks Mobility   Outcome Measure  Help needed turning from your back to your side while in a flat bed without using bedrails?: A Lot Help needed moving from lying on your back to sitting on the side of a flat bed without using bedrails?: A Lot Help needed moving to and from a bed to a chair (including a wheelchair)?: A Lot Help needed standing up from a chair using your arms (e.g., wheelchair or bedside chair)?: A Lot Help needed to walk in hospital room?: A Little Help needed climbing 3-5 steps with a railing? : A Lot 6 Click Score: 13    End of Session Equipment Utilized During Treatment: Gait belt Activity Tolerance: Patient tolerated treatment well Patient left: in chair;with call bell/phone within reach;with chair alarm set;with family/visitor present Nurse Communication: Mobility status PT Visit Diagnosis: Unsteadiness on feet (R26.81);Other abnormalities of gait and mobility (R26.89);Muscle weakness (generalized) (M62.81);Difficulty in walking, not elsewhere classified (R26.2);Hemiplegia and hemiparesis Hemiplegia - Right/Left: Right Hemiplegia - caused by:  (chronic)     Time: 8796-8763 PT Time Calculation (min) (ACUTE ONLY): 33 min  Charges:    $Gait Training: 8-22 mins PT General Charges $$ ACUTE PT VISIT: 1 Visit                     Izetta Call, PT, DPT   Acute Rehabilitation Department Office [phone] Secure Chat Communication Preferred    Izetta JULIANNA Call 12/04/2023, 2:13 PM

## 2023-12-04 NOTE — Plan of Care (Signed)
   Problem: Coping: Goal: Ability to adjust to condition or change in health will improve Outcome: Progressing

## 2023-12-04 NOTE — Assessment & Plan Note (Signed)
 Na 152, which has remained stable. Most likely due to dehydration with HHS. Will continue to monitor. - AM BMP

## 2023-12-04 NOTE — TOC Progression Note (Addendum)
 Transition of Care (TOC) - Progression Note    Patient Details  Name: Justin Molina. MRN: [id number] Date of Birth: 15-Feb-1960  Transition of [facility] CM/SW Contact  Isaiah Public, LCSWA Phone Number: 12/04/2023, 11:21 AM  Clinical Narrative:     CSW provided patients SNF bed offers at bedside. Patients spouse who was at bedside request to review and for CSW to call back with SNF choice. CSW to follow up this afternoon. CSW following to start insurance authorization for patient.  CSW request for Jon CMA to start insurance authorization for SNF for patient.   Update- CSW received call back from patients spouse who informed CSW that patient accepts SNF bed offer with [facility]. CSW informed Jon CMA who will add patients facility choice to patients insurance authorization request.  Expected Discharge Plan: Skilled Nursing Facility Barriers to Discharge: Continued Medical Work up               Expected Discharge Plan and Services In-house Referral: Clinical Social Work     Living arrangements for the past 2 months: Apartment                                       Social Drivers of Health (SDOH) Interventions SDOH Screenings   Food Insecurity: Food Insecurity Present (12/01/2023)  Housing: Low Risk  (12/01/2023)  Transportation Needs: No Transportation Needs (12/01/2023)  Utilities: At Risk (12/01/2023)  Tobacco Use: Low Risk  (12/01/2023)    Readmission Risk Interventions    04/10/2023    4:30 PM  Readmission Risk Prevention Plan  Transportation Screening Complete  PCP or Specialist Appt within 3-5 Days Complete  HRI or Home Care Consult Complete  Palliative Care Screening Not Applicable  Medication Review (RN Care Manager) Complete

## 2023-12-04 NOTE — Plan of Care (Signed)

## 2023-12-04 NOTE — Assessment & Plan Note (Addendum)
 Fasting blood glucose improving but remains elevated (166-254). Per inpatient diabetes coordinator, patient was on Ozempic until about 3 months ago which is when his blood sugars started to rise. This was confirmed with CGM data. - Increase basal to 24 units, moderate SSI - AM BMP, Mg - Holding home Jardiance  10mg  daily - PT/OT eval

## 2023-12-04 NOTE — Evaluation (Signed)
 Occupational Therapy Evaluation Patient Details Name: Justin Molina. MRN: [id number] DOB: 06-18-1960 Today's Date: 12/04/2023   History of Present Illness   The pt is a 63 yo male presenting 10/26 with hyperglycemia, work up revealed afib with RVR to 177 on EMS arrival. PMH includes: CVA with R-sided deficits and aphasia, HTN, DM II, CKD, HLD, and CHF.     Clinical Impressions Pt presents with decline in function and safety with ADLs and ADL mobility with impaired strength, balance, endurance and R UE weakness and chronic R hemi with decreased extension in grip, limited ROM throughout with no active movement. PTA pt lives with his wife and was Ind with bathing, dressing, Sup for safety from spouse for bathing, pt does laundry, Mod I using hemiwalker, no falls, no splints or orthotics for R UE. Pt currently requires mod A with UB ADLs, max A with LB ADLs, mod A with toileitng , mod A +2 STS/mobility using hemiwalker. OT will follow acutely to maximize level of function and safety     If plan is discharge home, recommend the following:   A lot of help with bathing/dressing/bathroom;A lot of help with walking and/or transfers;Assist for transportation;Help with stairs or ramp for entrance;Direct supervision/assist for medications management     Functional Status Assessment   Patient has had a recent decline in their functional status and demonstrates the ability to make significant improvements in function in a reasonable and predictable amount of time.     Equipment Recommendations   None recommended by OT     Recommendations for Other Services         Precautions/Restrictions   Precautions Precautions: Fall Recall of Precautions/Restrictions: Intact Precaution/Restrictions Comments: R hemi, aphasia Restrictions Weight Bearing Restrictions Per Provider Order: No     Mobility Bed Mobility Overal bed mobility: Needs Assistance Bed Mobility: Supine to Sit      Supine to sit: Mod assist, +2 for physical assistance     General bed mobility comments: NT assisted OT with sitting EOB    Transfers Overall transfer level: Needs assistance Equipment used: Hemi-walker Transfers: Sit to/from Stand, Bed to chair/wheelchair/BSC Sit to Stand: Mod assist, +2 physical assistance     Step pivot transfers: Mod assist, +2 physical assistance     General transfer comment: modA of 2 to complete hip clearance and steady during rise, pt with posterior lean. improved with use of hemi-walker. modA to steady with lateral steps      Balance Overall balance assessment: Needs assistance Sitting-balance support: No upper extremity supported, Feet supported Sitting balance-Leahy Scale: Fair     Standing balance support: Bilateral upper extremity supported, During functional activity Standing balance-Leahy Scale: Poor                             ADL either performed or assessed with clinical judgement   ADL Overall ADL's : Needs assistance/impaired Eating/Feeding: Set up;Sitting   Grooming: Wash/dry hands;Wash/dry face;Contact guard assist;Sitting   Upper Body Bathing: Moderate assistance   Lower Body Bathing: Maximal assistance   Upper Body Dressing : Moderate assistance   Lower Body Dressing: Maximal assistance   Toilet Transfer: Moderate assistance;+2 for physical assistance   Toileting- Clothing Manipulation and Hygiene: Moderate assistance       Functional mobility during ADLs: Moderate assistance;+2 for safety/equipment (hemiwalker)       Vision Ability to See in Adequate Light: 0 Adequate Patient Visual Report: No change from baseline  Perception         Praxis         Pertinent Vitals/Pain Pain Assessment Pain Assessment: No/denies pain     Extremity/Trunk Assessment Upper Extremity Assessment Upper Extremity Assessment: Generalized weakness;Left hand dominant;RUE deficits/detail RUE Deficits / Details:  chronic R hemi with decreased extension in grip, limited ROM throughout with no active movement noted   Lower Extremity Assessment Lower Extremity Assessment: Defer to PT evaluation       Communication Communication Communication: Impaired Factors Affecting Communication: Difficulty expressing self   Cognition Arousal: Alert Behavior During Therapy: [facility] for tasks assessed/performed                                 Following commands: Impaired Following commands impaired: Follows one step commands with increased time     Cueing  General Comments   Cueing Techniques: Verbal cues;Gestural cues;Tactile cues  VSS on RA   Exercises     Shoulder Instructions      Home Living Family/patient expects to be discharged to:: Private residence Living Arrangements: Spouse/significant other Available Help at Discharge: Family;Available 24 hours/day Type of Home: Apartment Home Access: Level entry     Home Layout: One level     Bathroom Shower/Tub: Chief Strategy Officer: Handicapped height     Home Equipment: Tub bench;Wheelchair - manual;BSC/3in1;Wheelchair - power;Hospital bed;Other (comment)          Prior Functioning/Environment Prior Level of Function : Independent/Modified Independent             Mobility Comments: ModI using hemiwalker, able to manage standing balance without UE support. no falls, not active with PT currently ADLs Comments: independent with bathing, dressing, Sup for safety from spouse for bathing, pt does laundry    OT Problem List: Decreased strength;Decreased range of motion;Decreased coordination;Impaired tone;Decreased activity tolerance;Impaired balance (sitting and/or standing);Impaired sensation;Impaired UE functional use   OT Treatment/Interventions:        OT Goals(Current goals can be found in the care plan section)   Acute Rehab OT Goals Patient Stated Goal: none stated OT Goal Formulation: With  patient/family Time For Goal Achievement: 12/18/23 Potential to Achieve Goals: Good ADL Goals Pt Will Perform Grooming: with supervision;with set-up;sitting Pt Will Perform Upper Body Bathing: with min assist;sitting Pt Will Perform Lower Body Bathing: with mod assist;with min assist;sitting/lateral leans Pt Will Perform Upper Body Dressing: with min assist;sitting Pt Will Perform Lower Body Dressing: with mod assist;with min assist;sitting/lateral leans Pt Will Transfer to Toilet: with mod assist;with min assist;ambulating Pt Will Perform Toileting - Clothing Manipulation and hygiene: with min assist;sitting/lateral leans;sit to/from stand   OT Frequency:  Min 2X/week    Co-evaluation              AM-PAC OT 6 Clicks Daily Activity     Outcome Measure Help from another person eating meals?: A Little Help from another person taking care of personal grooming?: A Little Help from another person toileting, which includes using toliet, bedpan, or urinal?: A Lot Help from another person bathing (including washing, rinsing, drying)?: A Lot Help from another person to put on and taking off regular upper body clothing?: A Lot Help from another person to put on and taking off regular lower body clothing?: A Lot 6 Click Score: 14   End of Session Equipment Utilized During Treatment: Gait belt;Other (comment) (BSC, hemiwalker) Nurse Communication: Mobility status  Activity Tolerance:  Patient left: in chair;with call bell/phone within reach;with family/visitor present  OT Visit Diagnosis: Unsteadiness on feet (R26.81);Other abnormalities of gait and mobility (R26.89);Muscle weakness (generalized) (M62.81);Hemiplegia and hemiparesis Hemiplegia - Right/Left: Right                Time: 8856-8760 OT Time Calculation (min): 56 min Charges:  OT General Charges $OT Visit: 1 Visit OT Evaluation $OT Eval Moderate Complexity: 1 Mod OT Treatments $Self Care/Home Management : 8-22  mins $Therapeutic Activity: 8-22 mins    Jacques Karna Loose 12/04/2023, 3:21 PM

## 2023-12-04 NOTE — Assessment & Plan Note (Signed)
 HTN - continue home Losartan 25 mg daily, Imdur  120 mg daily, Doxazosin  8 mg; holding home Hydralazine  100 mg TID as BP remains well-controlled without it; stopped home Amlodipine  10 mg daily per cardiology HLD - continue Lipitor  80 mg every day

## 2023-12-04 NOTE — Inpatient Diabetes Management (Signed)
 Inpatient Diabetes Program Recommendations  AACE/ADA: New Consensus Statement on Inpatient Glycemic Control (2015)  Target Ranges:  Prepandial:   less than 140 mg/dL      Peak postprandial:   less than 180 mg/dL (1-2 hours)      Critically ill patients:  140 - 180 mg/dL   Lab Results  Component Value Date   GLUCAP 222 (H) 12/04/2023   HGBA1C 9.4 (H) 12/01/2023    Review of Glycemic Control  Latest Reference Range & Units 12/03/23 12:07 12/03/23 15:47 12/03/23 21:56 12/04/23 07:48 12/04/23 12:43  Glucose-Capillary 70 - 99 mg/dL 704 (H) 745 (H) 753 (H) 180 (H) 222 (H)   Diabetes history: DM 2 Outpatient Diabetes medications:  Jardiance  10 mg daily  Patients wife states he has not been taking insulin  or Ozempic at home  Current orders for Inpatient glycemic control:  Novolog  0-15 units tid with meals Semglee 24 units daily  Inpatient Diabetes Program Recommendations:    Blood sugars improved today.  Wife requested sample sensor (Freestyle libre 3) as patients ran out yesterday.  Provided patient with sample Freestyle Libre 3 that can be placed by wife at discharge.   Thanks,  Randall Bullocks, RN, BC-ADM Inpatient Diabetes Coordinator Pager [phone]  (8a-5p)

## 2023-12-04 NOTE — Plan of Care (Signed)

## 2023-12-05 ENCOUNTER — Other Ambulatory Visit (HOSPITAL_COMMUNITY): Payer: Self-pay

## 2023-12-05 ENCOUNTER — Telehealth (HOSPITAL_COMMUNITY): Payer: Self-pay

## 2023-12-05 DIAGNOSIS — I480 Paroxysmal atrial fibrillation: Secondary | ICD-10-CM | POA: Diagnosis not present

## 2023-12-05 LAB — BASIC METABOLIC PANEL WITH GFR
Anion gap: 13 (ref 5–15)
BUN: 41 mg/dL — ABNORMAL HIGH (ref 8–23)
CO2: 23 mmol/L (ref 22–32)
Calcium: 8.3 mg/dL — ABNORMAL LOW (ref 8.9–10.3)
Chloride: 111 mmol/L (ref 98–111)
Creatinine, Ser: 2.34 mg/dL — ABNORMAL HIGH (ref 0.61–1.24)
GFR, Estimated: 30 mL/min — ABNORMAL LOW (ref >60)
Glucose, Bld: 152 mg/dL — ABNORMAL HIGH (ref 70–99)
Potassium: 3.5 mmol/L (ref 3.5–5.1)
Sodium: 147 mmol/L — ABNORMAL HIGH (ref 135–145)

## 2023-12-05 LAB — GLUCOSE, CAPILLARY
Glucose-Capillary: 209 mg/dL — ABNORMAL HIGH (ref 70–99)
Glucose-Capillary: 335 mg/dL — ABNORMAL HIGH (ref 70–99)
Glucose-Capillary: 221 mg/dL — ABNORMAL HIGH (ref 70–99)
Glucose-Capillary: 167 mg/dL — ABNORMAL HIGH (ref 70–99)

## 2023-12-05 LAB — MAGNESIUM: Magnesium: 2.2 mg/dL (ref 1.7–2.4)

## 2023-12-05 MED ORDER — POTASSIUM CHLORIDE CRYS ER 20 MEQ PO TBCR
40.0000 meq | EXTENDED_RELEASE_TABLET | Freq: Once | ORAL | Status: AC
  Administered 2023-12-05: 40 meq via ORAL
  Filled 2023-12-05: qty 2

## 2023-12-05 MED ORDER — INSULIN GLARGINE-YFGN 100 UNIT/ML ~~LOC~~ SOLN
26.0000 [IU] | Freq: Every day | SUBCUTANEOUS | Status: DC
  Administered 2023-12-06: 26 [IU] via SUBCUTANEOUS
  Filled 2023-12-05 (×2): qty 0.26

## 2023-12-05 MED ORDER — HYDRALAZINE HCL 50 MG PO TABS
50.0000 mg | ORAL_TABLET | Freq: Four times a day (QID) | ORAL | Status: DC
  Administered 2023-12-05 – 2023-12-06 (×6): 50 mg via ORAL
  Filled 2023-12-05 (×6): qty 1

## 2023-12-05 MED ORDER — TORSEMIDE 20 MG PO TABS: 20.0000 mg | ORAL_TABLET | Freq: Every day | ORAL | Status: DC

## 2023-12-05 NOTE — Assessment & Plan Note (Signed)
 Na 147, continues to improve with correction of sugar. - AM BMP

## 2023-12-05 NOTE — Progress Notes (Signed)
 Daily Progress Note Intern Pager: [phone]  Patient name: Justin Molina. Medical record number: [id number] Date of birth: 1961-01-06 Age: 63 y.o. Gender: male  Primary Care Provider: [facility] Consultants: Cardiology Code Status: Full  Pt Overview and Major Events to Date:  10/26 - admitted  Assessment and Plan:  This is a 63 yo male presenting with HHS secondary to cost prohibition of diabetes medications, also found to be in Afib with RVR. His PMH includes T2DM, HTN, CKD, HLD, aphasia, hemiplegia, hemiparesis affecting the right side, OSA, A-fib, CHF, BPH and CAD.  At this time he is medically stable for discharge to skilled nursing facility. Assessment & Plan Paroxysmal A-fib (HCC) Atrial fibrillation with RVR (HCC) Chronic heart failure with preserved ejection fraction (HFpEF, >= 50%) (HCC) Heart rate remains stable in the 60s to 70s on current dose of diltiazem.  K this morning 3.5, repleted. Medically stable, continue to monitor. - Cardiology consulted, appreciate recommendations - Continue PO Diltiazem to 300 mg daily - Continue Carvedilol  25mg  BID - Continuous cardiac monitoring - Strict I's and O's, daily weights - Continue eliquis  5 mg -hold torsemide  per cardiology recs Hyperosmolar hyperglycemic state (HHS) (HCC) Fasting blood sugar this morning was 152 which is improved from yesterday.  Continue to titrate his insulin  as appropriate plan to discharge him on Ozempic which is what he was taking at home. - Increase basal to 24 units, moderate SSI - AM BMP, Mg - Holding home Jardiance  10mg  daily, plan to restart when AKI has improved - PT/OT eval Hypernatremia Na 147, continues to improve with correction of sugar. - AM BMP Chronic health problem HTN - continue home Losartan 25 mg daily, Imdur  120 mg daily, Doxazosin  8 mg; holding home Hydralazine  100 mg TID as BP remains well-controlled without it; stopped home Amlodipine  10 mg daily per  cardiology HLD - continue Lipitor  80 mg every day  FEN/GI: Carb modified PPx: Eliquis  Dispo:SNF insurance authorization.    Subjective:  Patient is feeling better this morning, denies abdominal pain, SOB. Wife is concerned this morning because overnight she woke up to them placing a condom cath and they did not explain it to her before hand. She expressed that she would like explanation of this treatments before hand, especially if they involve touching him as he does not always understand.  Objective: Temp:  [97.3 F (36.3 C)-98.5 F (36.9 C)] 97.8 F (36.6 C) (10/30 0428) Pulse Rate:  [60-73] 68 (10/30 0428) Resp:  [14-20] 14 (10/30 0428) BP: (110-143)/(78-99) 134/99 (10/30 0428) SpO2:  [94 %-97 %] 96 % (10/30 0428) Weight:  [123.9 kg] 123.9 kg (10/30 0428) Physical Exam: General: Well appearing, no distress Cardiovascular: RRR, no m/r/g Respiratory: CTAB, no increased WOB Abdomen: flat, soft, nontender Extremities: warm, well perfused  Laboratory: Most recent CBC Lab Results  Component Value Date   WBC 6.8 12/03/2023   HGB 16.9 12/03/2023   HCT 53.0 (H) 12/03/2023   MCV 84.4 12/03/2023   PLT 122 (L) 12/03/2023   Most recent BMP    Latest Ref Rng & Units 12/05/2023    2:26 AM  BMP  Glucose 70 - 99 mg/dL 847   BUN 8 - 23 mg/dL 41   Creatinine 9.38 - 1.24 mg/dL 7.65   Sodium 864 - 854 mmol/L 147   Potassium 3.5 - 5.1 mmol/L 3.5   Chloride 98 - 111 mmol/L 111   CO2 22 - 32 mmol/L 23   Calcium  8.9 - 10.3 mg/dL 8.3  Justin Lukes, DO 12/05/2023, 7:37 AM  PGY-2, [facility] FPTS Intern pager: [phone], text pages welcome Secure chat group [facility] Teaching Service

## 2023-12-05 NOTE — Assessment & Plan Note (Addendum)
 Fasting blood sugar this morning was 152 which is improved from yesterday.  Continue to titrate his insulin  as appropriate plan to discharge him on Ozempic which is what he was taking at home. - Increase basal to 24 units, moderate SSI - AM BMP, Mg - Holding home Jardiance  10mg  daily, plan to restart when AKI has improved - PT/OT eval

## 2023-12-05 NOTE — Assessment & Plan Note (Signed)
 HTN - continue home Losartan 25 mg daily, Imdur  120 mg daily, Doxazosin  8 mg; holding home Hydralazine  100 mg TID as BP remains well-controlled without it; stopped home Amlodipine  10 mg daily per cardiology HLD - continue Lipitor  80 mg every day

## 2023-12-05 NOTE — TOC Progression Note (Addendum)
 Transition of Care (TOC) - Progression Note    Patient Details  Name: Justin Molina. MRN: [id number] Date of Birth: 10-31-1960  Transition of Care [facility]) CM/SW Contact  Justin Molina, LCSWA Phone Number: 12/05/2023, 12:39 PM  Clinical Narrative:     Jon CMA informed CSW that patients insurance authorization is not managed directly by [facility] facility will need to start auth. CSW informed Justin Molina with [facility] who confirmed facility will start insurance authorization for patient. CSW updated patients spouse Justin Molina.CSW will continue to follow.  Expected Discharge Plan: Skilled Nursing Facility Barriers to Discharge: Continued Medical Work up               Expected Discharge Plan and Services In-house Referral: Clinical Social Work     Living arrangements for the past 2 months: Apartment                                       Social Drivers of Health (SDOH) Interventions SDOH Screenings   Food Insecurity: Food Insecurity Present (12/01/2023)  Housing: Low Risk  (12/01/2023)  Transportation Needs: No Transportation Needs (12/01/2023)  Utilities: At Risk (12/01/2023)  Tobacco Use: Low Risk  (12/01/2023)    Readmission Risk Interventions    04/10/2023    4:30 PM  Readmission Risk Prevention Plan  Transportation Screening Complete  PCP or Specialist Appt within 3-5 Days Complete  HRI or Home Care Consult Complete  Palliative Care Screening Not Applicable  Medication Review (RN Care Manager) Complete

## 2023-12-05 NOTE — Assessment & Plan Note (Addendum)
 Heart rate remains stable in the 60s to 70s on current dose of diltiazem.  K this morning 3.5, repleted. Medically stable, continue to monitor. - Cardiology consulted, appreciate recommendations - Continue PO Diltiazem to 300 mg daily - Continue Carvedilol  25mg  BID - Continuous cardiac monitoring - Strict I's and O's, daily weights - Continue eliquis  5 mg -hold torsemide  per cardiology recs

## 2023-12-05 NOTE — Telephone Encounter (Signed)
 Pharmacy Patient Advocate Encounter  Insurance verification completed.    The patient is insured through [employer]. Patient has Medicare and is not eligible for a copay card, but may be able to apply for patient assistance or Medicare RX Payment Plan (Patient Must reach out to their plan, if eligible for payment plan), if available.    Ran test claim for Mounjaro 2.5mg /0.58ml and the current 30 day co-pay is $0.   This test claim was processed through [employer]- copay amounts may vary at other pharmacies due to [employer], or as the patient moves through the different stages of their insurance plan.

## 2023-12-05 NOTE — Progress Notes (Addendum)
 Progress Note  Patient Name: Justin Molina. Date of Encounter: 12/05/2023 [facility] Cardiologist: Lonni LITTIE Nanas, MD   Interval Summary   Reported feeling good today, he is hungry.  Denied HF symptoms.   Vital Signs Vitals:   12/04/23 1937 12/04/23 2244 12/05/23 0028 12/05/23 0428  BP: 124/84 (!) 143/78 (!) 141/83 (!) 134/99  Pulse: 71  60 68  Resp: 20  18 14   Temp: 97.7 F (36.5 C)  (!) 97.3 F (36.3 C) 97.8 F (36.6 C)  TempSrc: Oral  Oral Oral  SpO2: 97%  97% 96%  Weight:    123.9 kg  Height:        Intake/Output Summary (Last 24 hours) at 12/05/2023 0747 Last data filed at 12/05/2023 0433 Gross per 24 hour  Intake 1260 ml  Output 1900 ml  Net -640 ml      12/05/2023    4:28 AM 12/04/2023    5:00 AM 12/01/2023    9:50 PM  Last 3 Weights  Weight (lbs) 273 lb 2.4 oz 271 lb 269 lb 13.5 oz  Weight (kg) 123.9 kg 122.925 kg 122.4 kg      Telemetry/ECG  Rate controlled AF/AFL HR ~ 80 - Personally Reviewed  Physical Exam  GEN: No acute distress.   Neck: mild JVD with HJR Cardiac: Irregularly, irregular, no murmurs, rubs, or gallops.  Respiratory: Clear to auscultation bilaterally. GI: Soft, nontender, non-distended  MS: No edema  Assessment & Plan  Indigo Chaddock. is a 63 y.o. male with a hx of paroxysmal atrial fibrillation, HFpEF, history CVA x 2, resistant hypertension, PFO, OSA, HLD, type 2 diabetes, CKD who presented to the ED on 10/26 for confusion and hyperglycemia after running out of insulin  for several days. Patient found to be in AF RVR on EMS arrival. On ED arrival AF RVR HR 124 and BG 605. Patient was started on IV fluids, insulin , and dilt. Patient admitted to IM service for HHS. Cardiology consulted.    Longstanding persistent atrial fibrillation.  Patient remains in rate controlled AF  ChadsVasc Score 5  Patient has never had a DCCV, he has been compliant with his anticoagulation. Will refer him to AF clinic after  discharge about possible rhythm control. He was previously on multaq  though discontinued 04/2023 due to recurrent CHF exacerbations.    Continue dilt 300 mg Continue coreg  25 mg BID Continue Eliqius 5mg  BID   Chronic HFpEF Seen recently by AHF team, not a candidate for advanced therapies.  On exam appears, euvolemic though mild JVD. Will hold off on re-starting PTA torsemide  today, though should probably restart by discharge pending renal function.     Continue coreg  as above Continue losartan as below Jardiance  on hold 2/2 dehydration, AKI on CKD, and HHS. All improving, could consider re-starting though will defer to primary as they are managing his AKI and HHS. Defer MRA 2/2 renal dysfunction though improving, would like to re-start jardiance  first.    Hypertension BP: 134/99 PTA amlodipine  dc 2/2 dilt initiation. PTA hydralazine  has been on hold as he has been maintaining controlled BP, however BP starting to increase, during interview BP was ~170/120.   Restart hydralazine  50 mg TID, can uptitrate tomorrow to home 100 mg if BP still elevated though if renal function improved would like to start MRA for HF as recommended by HF team. Continue imdur  120 mg Continue losartan 25 mg Conitnue dilt as above Continue coreg  as above   Hyperlipidemia Continue lipitor  80 mg  AKI on CKD Improving, not yet at baseline   Hypernatremia Improving   Per primary T2DM with hyperglycemia/ HHS AKI on CKD BPH on cardura  OSA CVA w residual expressive aphasia   For questions or updates, please contact [facility] Please consult [email or web address] for contact info under       Signed, Leontine LOISE Salen, PA-C   I have seen and examined the patient along with Leontine LOISE Salen, PA-C .  I have reviewed the chart, notes and new data.  I agree with PA/NP's note.  Key new complaints: No CV complaints Key examination changes: rate controlled AFib, BP relatively high, no clinical signs of  hypervolemia at current weight of 273 lb Key new findings / data: Improving Na and creat, still not quite at baseline  PLAN: Current weight is similar to office visits in March and April when he was doing well. Discussed the importance of daily weight monitoring and the fact that rapid weight changes are indicative of fluid shift. Should acknowledge and report wt change of more than 3 lb in 24h or more than 5 lb per week. Should hold Jardiance  when dehydrated (which I would currently define as a weight of 270 lb or lower). Agree with adding hydralazine  back today.  Demitria Hay, MD, FACC [facility] [phone] 12/05/2023, 10:36 AM

## 2023-12-05 NOTE — Plan of Care (Signed)
  Problem: Coping: Goal: Ability to adjust to condition or change in health will improve Outcome: Progressing   Problem: Health Behavior/Discharge Planning: Goal: Ability to manage health-related needs will improve Outcome: Progressing   Problem: Skin Integrity: Goal: Risk for impaired skin integrity will decrease Outcome: Progressing   Problem: Health Behavior/Discharge Planning: Goal: Ability to manage health-related needs will improve Outcome: Progressing   Problem: Activity: Goal: Risk for activity intolerance will decrease Outcome: Progressing   Problem: Elimination: Goal: Will not experience complications related to bowel motility Outcome: Progressing   Problem: Skin Integrity: Goal: Risk for impaired skin integrity will decrease Outcome: Progressing

## 2023-12-05 NOTE — Discharge Summary (Shared)
 Family Medicine Teaching [facility] Discharge Summary  Patient name: Justin Molina. Medical record number: [id number] Date of birth: [birth year] Age: 63 y.o. Gender: male Date of Admission: 12/01/2023  Date of Discharge: 12/06/23 Admitting Physician: Areta Saliva, MD  Primary Care Provider: [facility] Consultants: Cardiology  Indication for Hospitalization: AMS  Discharge Diagnoses/Problem List:  Principal Problem for Admission: HHS Other Problems addressed during stay:  Principal Problem:   Paroxysmal A-fib (HCC) Active Problems:   Chronic heart failure with preserved ejection fraction (HFpEF, >= 50%) (HCC)   Atrial fibrillation with RVR (HCC)   Hyperosmolar hyperglycemic state (HHS) (HCC)   Chronic health problem   Hypernatremia  Brief Hospital Course:  Justin Molina. is a 63 y.o.male with PMH T2DM, HTN, CKD, HLD, aphasia, hemiplegia and hemiparesis affecting the right side, OSA, A-fib, CHF (EF 50-55%), BPH, CAD who was admitted to the [facility] Teaching Service at [facility] for HHS and Afib with RVR. His hospital course is detailed below:  HHS Patient with a week of poor p.o. and  high glucose readings on his glucometer, with acute onset of confusion shortly prior to admission.  Glucose 605, pH within normal limits, elevated anion gap, urinary ketones, who appeared to be hypovolemic with hemoconcentration and hypernatremia.  Insulin  drip started with IV fluid administration, and transitioned to long-acting insulin  when patient's blood sugars were below 200.  Patient had improvement in hypernatremia and hemoconcentration with IV fluid administration. Insulin  regimen was titrated to maintain BG between 120-180 prior to discharge. Discharged with reduced insulin  dose, home ozempic and jardiance .  A-fib with RVR Heart rate to 177 with EMS. Patient started on diltiazem drip in the ED, with improvement in rate control.  Home Eliquis  continued.  Cardiology consulted  and recommended transition to PO diltiazem, which was titrated to a final dose of 300 mg daily with appropriate rate control. He will continue to follow with them in the outpatient setting. Home amlodipine  was stopped.  Other chronic conditions were medically managed with home medications and formulary alternatives as necessary   PCP Follow-up Recommendations: Consider referral to cardiology for cardioversion Consider referral to Endocrinology for diabetes management Titrate insulin  as appropriate once discharged to SNF Call cardiology if weight exceeds 280 lbs to discuss resuming Torsemide      Results/Tests Pending at Time of Discharge:  Unresulted Labs (From admission, onward)     Start     Ordered   12/06/23 0500  Basic metabolic panel with GFR  Tomorrow morning,   R       Question:  Specimen collection method  Answer:  Lab=Lab collect   12/05/23 0928   12/06/23 0500  Magnesium  Tomorrow morning,   R       Question:  Specimen collection method  Answer:  Lab=Lab collect   12/05/23 0928             Disposition: SNF  Discharge Condition: stable  Discharge Exam:  Vitals:   12/05/23 1030 12/05/23 1124  BP: 134/86 118/76  Pulse:  76  Resp:  (!) 21  Temp:  97.6 F (36.4 C)  SpO2:  98%    General: Alert, well-appearing male in NAD.  Cardiovascular: Irregular, no m/r/g appreciated. Radial/pedal pulse +2 bilaterally Pulmonary: Normal WOB. CTAB with no w/c/r present. Abdomen: Soft, non-tender, non-distended. Extremities: Warm and well-perfused. Bilateral LE edema, L > R  Significant Procedures: none  Significant Labs and Imaging:  No results for input(s): WBC, HGB, HCT, PLT in the last 48 hours.  Recent Labs  Lab 12/05/23 0226 12/06/23 0425  NA 147* 141  K 3.5 3.4*  CL 111 105  CO2 23 24  GLUCOSE 152* 196*  BUN 41* 40*  CREATININE 2.34* 2.28*  CALCIUM  8.3* 8.1*  MG 2.2 2.2    Discharge Medications:  Allergies as of 12/06/2023       Reactions    Contrast Media [iodinated Contrast Media] Shortness Of Breath, Nausea And Vomiting, Swelling, Other (See Comments)   Facial swelling and felt flushed, too   Iodine Shortness Of Breath, Nausea And Vomiting, Swelling, Other (See Comments)   Facial swelling and felt flushed, too        Medication List     PAUSE taking these medications    torsemide  20 MG tablet Wait to take this until your doctor or other care provider tells you to start again. Commonly known as: DEMADEX  Take 2 tablets (40 mg total) by mouth 2 (two) times daily. Can take an additional tablet prn shortness of breath What changed:  how much to take additional instructions       TAKE these medications    acetaminophen  500 MG tablet Commonly known as: TYLENOL  Take 1 tablet (500 mg total) by mouth every 6 (six) hours as needed. What changed:  how much to take when to take this reasons to take this   albuterol  108 (90 Base) MCG/ACT inhaler Commonly known as: VENTOLIN  HFA Inhale 2 puffs into the lungs every 4 (four) hours as needed for wheezing or shortness of breath.   amLODipine  10 MG tablet Commonly known as: NORVASC  Take 10 mg by mouth daily.   atorvastatin  80 MG tablet Commonly known as: LIPITOR  Take 1 tablet (80 mg total) by mouth at bedtime.   carvedilol  25 MG tablet Commonly known as: COREG  Take 1 tablet (25 mg total) by mouth 2 (two) times daily with a meal. Patient needs office visit for more refills   diltiazem 300 MG 24 hr capsule Commonly known as: CARDIZEM CD Take 1 capsule (300 mg total) by mouth daily.   doxazosin  8 MG tablet Commonly known as: CARDURA  Take 8 mg by mouth at bedtime.   Eliquis  5 MG Tabs tablet Generic drug: apixaban  Take 1 tablet (5 mg total) by mouth 2 (two) times daily.   fluticasone 50 MCG/ACT nasal spray Commonly known as: FLONASE Place 2 sprays into both nostrils daily as needed for allergies or rhinitis.   glucose blood test strip Commonly known as:  Accu-Chek Aviva Plus Use as instructed for TID and QHS blood glucose testing   hydrALAZINE  50 MG tablet Commonly known as: APRESOLINE  Take 1 tablet (50 mg total) by mouth every 6 (six) hours. What changed:  medication strength how much to take when to take this   isosorbide  mononitrate 120 MG 24 hr tablet Commonly known as: IMDUR  Take 1 tablet (120 mg total) by mouth daily.   Jardiance  10 MG Tabs tablet Generic drug: empagliflozin  Take 1 tablet (10 mg total) by mouth daily.   Lancets 28G Misc Check blood sugar TID & QHS   Lantus SoloStar 100 UNIT/ML Solostar Pen Generic drug: insulin  glargine Inject 12 Units into the skin daily. As needed What changed: how much to take   loratadine  10 MG tablet Commonly known as: CLARITIN  Take 10 mg by mouth daily as needed for allergies or rhinitis.   losartan 25 MG tablet Commonly known as: COZAAR Take 25 mg by mouth daily.   nystatin cream Commonly known as: MYCOSTATIN Apply 1  Application topically daily as needed (yeast).   Ozempic (0.25 or 0.5 MG/DOSE) 2 MG/3ML Sopn Generic drug: Semaglutide(0.25 or 0.5MG /DOS) Inject 0.25 mg into the skin every Sunday.   potassium chloride  SA 20 MEQ tablet Commonly known as: KLOR-CON  M Take 1 tablet (20 mEq total) by mouth daily. What changed: when to take this        Discharge Instructions: Please refer to Patient Instructions section of EMR for full details.  Patient was counseled important signs and symptoms that should prompt return to medical care, changes in medications, dietary instructions, activity restrictions, and follow up appointments.   Follow-Up Appointments:  Contact information for follow-up providers     [facility] Follow up.   Why: [employer] 3.456 Google reviews Dentist in [place], [employer] Directions Reviews Save Share Call  Address: [address] [place], [place], [place] [address] Get  There: 13 min  5 min Phone: [phone] Hours:  Closed  Opens 8:30 AM Mon Contact information: Call them monday to inquire about utility help             Contact information for after-discharge care     Destination     [facility] .   Service: Skilled Nursing Contact information: [address] [place]  [address] [phone]                     Jerrie Gathers, DO 12/06/2023, 12:54 PM PGY-1, [facility]   UL Attestation I have reviewed the information in the above Discharge Summary and attest that it is accurate and reflects the care provided by our team.  Lucie Pinal, DO PGY-2, Family Medicine

## 2023-12-06 DIAGNOSIS — I480 Paroxysmal atrial fibrillation: Secondary | ICD-10-CM | POA: Diagnosis not present

## 2023-12-06 LAB — BASIC METABOLIC PANEL WITH GFR
Anion gap: 12 (ref 5–15)
BUN: 40 mg/dL — ABNORMAL HIGH (ref 8–23)
CO2: 24 mmol/L (ref 22–32)
Calcium: 8.1 mg/dL — ABNORMAL LOW (ref 8.9–10.3)
Chloride: 105 mmol/L (ref 98–111)
Creatinine, Ser: 2.28 mg/dL — ABNORMAL HIGH (ref 0.61–1.24)
GFR, Estimated: 31 mL/min — ABNORMAL LOW (ref >60)
Glucose, Bld: 196 mg/dL — ABNORMAL HIGH (ref 70–99)
Potassium: 3.4 mmol/L — ABNORMAL LOW (ref 3.5–5.1)
Sodium: 141 mmol/L (ref 135–145)

## 2023-12-06 LAB — MAGNESIUM: Magnesium: 2.2 mg/dL (ref 1.7–2.4)

## 2023-12-06 LAB — GLUCOSE, CAPILLARY
Glucose-Capillary: 184 mg/dL — ABNORMAL HIGH (ref 70–99)
Glucose-Capillary: 246 mg/dL — ABNORMAL HIGH (ref 70–99)
Glucose-Capillary: 330 mg/dL — ABNORMAL HIGH (ref 70–99)

## 2023-12-06 MED ORDER — POTASSIUM CHLORIDE CRYS ER 20 MEQ PO TBCR: 20.0000 meq | EXTENDED_RELEASE_TABLET | Freq: Every day | ORAL | Status: AC

## 2023-12-06 MED ORDER — LANTUS SOLOSTAR 100 UNIT/ML ~~LOC~~ SOPN: 12.0000 [IU] | PEN_INJECTOR | Freq: Every day | SUBCUTANEOUS | Status: DC

## 2023-12-06 MED ORDER — DILTIAZEM HCL ER COATED BEADS 300 MG PO CP24: 300.0000 mg | ORAL_CAPSULE | Freq: Every day | ORAL | Status: DC

## 2023-12-06 MED ORDER — POTASSIUM CHLORIDE CRYS ER 20 MEQ PO TBCR
40.0000 meq | EXTENDED_RELEASE_TABLET | Freq: Once | ORAL | Status: AC
  Administered 2023-12-06: 40 meq via ORAL
  Filled 2023-12-06: qty 2

## 2023-12-06 MED ORDER — EMPAGLIFLOZIN 10 MG PO TABS
10.0000 mg | ORAL_TABLET | Freq: Every day | ORAL | Status: DC
  Administered 2023-12-06: 10 mg via ORAL
  Filled 2023-12-06: qty 1

## 2023-12-06 MED ORDER — HYDRALAZINE HCL 50 MG PO TABS: 50.0000 mg | ORAL_TABLET | Freq: Four times a day (QID) | ORAL | Status: DC

## 2023-12-06 NOTE — Assessment & Plan Note (Addendum)
 HR stable at 60-70 on current dose of diltiazem. Good UOP but weight up 2 kg today and patient c/o of LE edema, plan as below. Cr improving 2.34 > 2.28. Medically stable, awaiting SNF authoriazation. - K 3.4, repleted - Cardiology consulted, appreciate recommendations - Continue PO Diltiazem 300 mg daily - Continue Carvedilol  25mg  BID - Restart hydralazine  50 mg TID - Continue holding torsemide , patient can reach out to cardiology if home weight exceeds 280 lbs to restart - Continuous cardiac monitoring - Strict I's and O's, daily weights - Continue Eliquis  5 mg daily - Ted hose ordered

## 2023-12-06 NOTE — Assessment & Plan Note (Addendum)
 HTN - continue home Losartan 25 mg daily, Imdur  120 mg daily, Doxazosin  8 mg daily; restarted home Hydralazine  at 50 mg q6hrs; stopped home Amlodipine  10 mg daily per cardiology HLD - continue Lipitor  80 mg every day

## 2023-12-06 NOTE — Progress Notes (Signed)
 AVS printed and placed in chart.  PTAR/SNF.  RN updated

## 2023-12-06 NOTE — Progress Notes (Signed)
 Physical Therapy Treatment Patient Details Name: Justin Molina. MRN: [id number] DOB: October 17, 1960 Today's Date: 12/06/2023   History of Present Illness The pt is a 63 yo male presenting 10/26 with hyperglycemia, work up revealed afib with RVR to 177 on EMS arrival. PMH includes: CVA with R-sided deficits and aphasia, HTN, DM II, CKD, HLD, and CHF.    PT Comments  Pt has progressed well toward goals.  Emphasis today on standing exercise, bed transitions without assist, STS's with/without an armrest and without the chair stationary. Then progression of gait stability/stamina and overall quality.     If plan is discharge home, recommend the following: A little help with walking and/or transfers;A little help with bathing/dressing/bathroom;Assistance with cooking/housework (prn assist)   Can travel by private vehicle     Yes  Equipment Recommendations  None recommended by PT    Recommendations for Other Services       Precautions / Restrictions Precautions Precautions: Fall Recall of Precautions/Restrictions: Intact Precaution/Restrictions Comments: R hemi, aphasia     Mobility  Bed Mobility Overal bed mobility: Needs Assistance Bed Mobility: Supine to Sit     Supine to sit: Supervision     General bed mobility comments: inefficient, extra time and labored, but up from flat HOB without assist.    Transfers Overall transfer level: Needs assistance Equipment used: Hemi-walker Transfers: Sit to/from Stand, Bed to chair/wheelchair/BSC Sit to Stand: Contact guard assist (from bed and a non-stationary arm chair where pt could not use the back of his legs for stability.  Needs work, but accomplished without contact assist.)   Step pivot transfers: Contact guard assist            Ambulation/Gait Ambulation/Gait assistance: Contact guard assist Gait Distance (Feet): 120 Feet (with standing rests x2) Assistive device: Hemi-walker Gait Pattern/deviations: Step-to  pattern, Decreased step length - right, Decreased stance time - right, Decreased dorsiflexion - right Gait velocity: decreased Gait velocity interpretation: <1.8 ft/sec, indicate of risk for recurrent falls   General Gait Details: increased RLE circumduction, chronic R foot drop, no overt buckling and good ability to pace with HR stable at low to upper 80's   Stairs             Wheelchair Mobility     Tilt Bed    Modified Rankin (Stroke Patients Only)       Balance Overall balance assessment: Needs assistance Sitting-balance support: No upper extremity supported, Feet supported Sitting balance-Leahy Scale:  (to good.)     Standing balance support: Single extremity supported, No upper extremity supported, During functional activity Standing balance-Leahy Scale: Poor (to fair)                              Communication Communication Communication: Impaired Factors Affecting Communication: Difficulty expressing self (verbally)  Cognition Arousal: Alert Behavior During Therapy: WFL for tasks assessed/performed   PT - Cognitive impairments: No apparent impairments Difficult to assess due to: Impaired communication                     PT - Cognition Comments: pt with hx of aphasia, able to answer some simple questions, wife reports answers inconsistently. follows commands well.  answered yes/no questions well.   Following commands impaired: Follows one step commands inconsistently    Cueing Cueing Techniques: Verbal cues, Gestural cues, Tactile cues  Exercises General Exercises - Lower Extremity Hip ABduction/ADduction: Left, 10 reps, Standing Hip Flexion/Marching:  AAROM Mini-Sqauts: AROM, Standing (12 reps)    General Comments General comments (skin integrity, edema, etc.): VSS with noisy PLETH registering lower 90's, HR low to mid 80's.  Pt about 85% on normal baseline.      Pertinent Vitals/Pain      Home Living                           Prior Function            PT Goals (current goals can now be found in the care plan section) Acute Rehab PT Goals PT Goal Formulation: With patient/family Time For Goal Achievement: 12/17/23 Potential to Achieve Goals: Good Progress towards PT goals: Progressing toward goals    Frequency    Min 2X/week      PT Plan      Co-evaluation              AM-PAC PT 6 Clicks Mobility   Outcome Measure  Help needed turning from your back to your side while in a flat bed without using bedrails?: A Little Help needed moving from lying on your back to sitting on the side of a flat bed without using bedrails?: A Little Help needed moving to and from a bed to a chair (including a wheelchair)?: A Little Help needed standing up from a chair using your arms (e.g., wheelchair or bedside chair)?: A Little Help needed to walk in hospital room?: A Little Help needed climbing 3-5 steps with a railing? : A Lot 6 Click Score: 17    End of Session Equipment Utilized During Treatment: Gait belt Activity Tolerance: Patient tolerated treatment well Patient left: in chair;with call bell/phone within reach;with chair alarm set;with family/visitor present Nurse Communication: Mobility status PT Visit Diagnosis: Unsteadiness on feet (R26.81);Other abnormalities of gait and mobility (R26.89);Muscle weakness (generalized) (M62.81);Difficulty in walking, not elsewhere classified (R26.2);Hemiplegia and hemiparesis Hemiplegia - Right/Left: Right Hemiplegia - dominant/non-dominant: Dominant     Time: 8897-8862 PT Time Calculation (min) (ACUTE ONLY): 35 min  Charges:    $Gait Training: 8-22 mins $Therapeutic Activity: 8-22 mins PT General Charges $$ ACUTE PT VISIT: 1 Visit                     12/06/2023  India HERO., PT Acute Rehabilitation Services [phone]  (office)   Justin Molina 12/06/2023, 11:57 AM

## 2023-12-06 NOTE — Plan of Care (Signed)
 Problem: Education: Goal: Ability to describe self-care measures that may prevent or decrease complications (Diabetes Survival Skills Education) will improve 12/06/2023 1901 by Norville President, RN Outcome: Progressing 12/06/2023 1901 by Norville President, RN Outcome: Progressing Goal: Individualized Educational Video(s) 12/06/2023 1901 by Norville President, RN Outcome: Progressing 12/06/2023 1901 by Norville President, RN Outcome: Progressing   Problem: Coping: Goal: Ability to adjust to condition or change in health will improve 12/06/2023 1901 by Norville President, RN Outcome: Progressing 12/06/2023 1901 by Norville President, RN Outcome: Progressing   Problem: Fluid Volume: Goal: Ability to maintain a balanced intake and output will improve 12/06/2023 1901 by Norville President, RN Outcome: Progressing 12/06/2023 1901 by Norville President, RN Outcome: Progressing   Problem: Health Behavior/Discharge Planning: Goal: Ability to identify and utilize available resources and services will improve 12/06/2023 1901 by Norville President, RN Outcome: Progressing 12/06/2023 1901 by Norville President, RN Outcome: Progressing Goal: Ability to manage health-related needs will improve 12/06/2023 1901 by Norville President, RN Outcome: Progressing 12/06/2023 1901 by Norville President, RN Outcome: Progressing   Problem: Metabolic: Goal: Ability to maintain appropriate glucose levels will improve 12/06/2023 1901 by Norville President, RN Outcome: Progressing 12/06/2023 1901 by Norville President, RN Outcome: Progressing   Problem: Nutritional: Goal: Maintenance of adequate nutrition will improve 12/06/2023 1901 by Norville President, RN Outcome: Progressing 12/06/2023 1901 by Norville President, RN Outcome: Progressing Goal: Progress toward achieving an optimal weight will improve 12/06/2023 1901 by Norville President, RN Outcome: Progressing 12/06/2023 1901 by Norville President, RN Outcome: Progressing   Problem: Skin Integrity: Goal: Risk for  impaired skin integrity will decrease 12/06/2023 1901 by Norville President, RN Outcome: Progressing 12/06/2023 1901 by Norville President, RN Outcome: Progressing   Problem: Tissue Perfusion: Goal: Adequacy of tissue perfusion will improve 12/06/2023 1901 by Norville President, RN Outcome: Progressing 12/06/2023 1901 by Norville President, RN Outcome: Progressing   Problem: Education: Goal: Ability to describe self-care measures that may prevent or decrease complications (Diabetes Survival Skills Education) will improve 12/06/2023 1901 by Norville President, RN Outcome: Progressing 12/06/2023 1901 by Norville President, RN Outcome: Progressing Goal: Individualized Educational Video(s) 12/06/2023 1901 by Norville President, RN Outcome: Progressing 12/06/2023 1901 by Norville President, RN Outcome: Progressing   Problem: Cardiac: Goal: Ability to maintain an adequate cardiac output will improve 12/06/2023 1901 by Norville President, RN Outcome: Progressing 12/06/2023 1901 by Norville President, RN Outcome: Progressing   Problem: Health Behavior/Discharge Planning: Goal: Ability to identify and utilize available resources and services will improve 12/06/2023 1901 by Norville President, RN Outcome: Progressing 12/06/2023 1901 by Norville President, RN Outcome: Progressing Goal: Ability to manage health-related needs will improve 12/06/2023 1901 by Norville President, RN Outcome: Progressing 12/06/2023 1901 by Norville President, RN Outcome: Progressing   Problem: Fluid Volume: Goal: Ability to achieve a balanced intake and output will improve 12/06/2023 1901 by Norville President, RN Outcome: Progressing 12/06/2023 1901 by Norville President, RN Outcome: Progressing   Problem: Metabolic: Goal: Ability to maintain appropriate glucose levels will improve 12/06/2023 1901 by Norville President, RN Outcome: Progressing 12/06/2023 1901 by Norville President, RN Outcome: Progressing   Problem: Nutritional: Goal: Maintenance of adequate nutrition will  improve 12/06/2023 1901 by Norville President, RN Outcome: Progressing 12/06/2023 1901 by Norville President, RN Outcome: Progressing Goal: Maintenance of adequate weight for body size and type will improve 12/06/2023 1901 by Norville President, RN Outcome: Progressing 12/06/2023 1901 by Norville President, RN Outcome: Progressing   Problem: Respiratory: Goal: Will regain and/or maintain adequate ventilation 12/06/2023 1901 by Norville President, RN Outcome: Progressing 12/06/2023  1901 by Norville President, RN Outcome: Progressing   Problem: Urinary Elimination: Goal: Ability to achieve and maintain adequate renal perfusion and functioning will improve 12/06/2023 1901 by Norville President, RN Outcome: Progressing 12/06/2023 1901 by Norville President, RN Outcome: Progressing   Problem: Education: Goal: Knowledge of General Education information will improve Description: Including pain rating scale, medication(s)/side effects and non-pharmacologic comfort measures 12/06/2023 1901 by Norville President, RN Outcome: Progressing 12/06/2023 1901 by Norville President, RN Outcome: Progressing   Problem: Health Behavior/Discharge Planning: Goal: Ability to manage health-related needs will improve 12/06/2023 1901 by Norville President, RN Outcome: Progressing 12/06/2023 1901 by Norville President, RN Outcome: Progressing   Problem: Clinical Measurements: Goal: Ability to maintain clinical measurements within normal limits will improve 12/06/2023 1901 by Norville President, RN Outcome: Progressing 12/06/2023 1901 by Norville President, RN Outcome: Progressing Goal: Will remain free from infection 12/06/2023 1901 by Norville President, RN Outcome: Progressing 12/06/2023 1901 by Norville President, RN Outcome: Progressing Goal: Diagnostic test results will improve 12/06/2023 1901 by Norville President, RN Outcome: Progressing 12/06/2023 1901 by Norville President, RN Outcome: Progressing Goal: Respiratory complications will improve 12/06/2023 1901 by  Norville President, RN Outcome: Progressing 12/06/2023 1901 by Norville President, RN Outcome: Progressing Goal: Cardiovascular complication will be avoided 12/06/2023 1901 by Norville President, RN Outcome: Progressing 12/06/2023 1901 by Norville President, RN Outcome: Progressing   Problem: Activity: Goal: Risk for activity intolerance will decrease 12/06/2023 1901 by Norville President, RN Outcome: Progressing 12/06/2023 1901 by Norville President, RN Outcome: Progressing   Problem: Nutrition: Goal: Adequate nutrition will be maintained 12/06/2023 1901 by Norville President, RN Outcome: Progressing 12/06/2023 1901 by Norville President, RN Outcome: Progressing   Problem: Coping: Goal: Level of anxiety will decrease 12/06/2023 1901 by Norville President, RN Outcome: Progressing 12/06/2023 1901 by Norville President, RN Outcome: Progressing   Problem: Elimination: Goal: Will not experience complications related to bowel motility 12/06/2023 1901 by Norville President, RN Outcome: Progressing 12/06/2023 1901 by Norville President, RN Outcome: Progressing Goal: Will not experience complications related to urinary retention 12/06/2023 1901 by Norville President, RN Outcome: Progressing 12/06/2023 1901 by Norville President, RN Outcome: Progressing   Problem: Pain Managment: Goal: General experience of comfort will improve and/or be controlled 12/06/2023 1901 by Norville President, RN Outcome: Progressing 12/06/2023 1901 by Norville President, RN Outcome: Progressing   Problem: Safety: Goal: Ability to remain free from injury will improve 12/06/2023 1901 by Norville President, RN Outcome: Progressing 12/06/2023 1901 by Norville President, RN Outcome: Progressing   Problem: Skin Integrity: Goal: Risk for impaired skin integrity will decrease 12/06/2023 1901 by Norville President, RN Outcome: Progressing 12/06/2023 1901 by Norville President, RN Outcome: Progressing

## 2023-12-06 NOTE — Progress Notes (Addendum)
 Progress Note  Patient Name: Justin Molina. Date of Encounter: 12/06/2023 Justin Molina Cardiologist: Justin LITTIE Nanas, MD   Interval Summary   Feeling good today, no cardiac complaints. Ready to go home.   Vital Signs Vitals:   12/05/23 2004 12/05/23 2318 12/06/23 0426 12/06/23 0731  BP: 125/81 123/74 127/74 129/66  Pulse: 60 71 (!) 59 70  Resp: 20 20 20 20   Temp: (!) 97.5 F (36.4 C) 97.8 F (36.6 C) 99 F (37.2 C) 97.7 F (36.5 C)  TempSrc: Oral Oral Oral Oral  SpO2: 95% 92% 97% 96%  Weight:   126.2 kg   Height:        Intake/Output Summary (Last 24 hours) at 12/06/2023 0757 Last data filed at 12/06/2023 0540 Gross per 24 hour  Intake 520 ml  Output 1800 ml  Net -1280 ml      12/06/2023    4:26 AM 12/05/2023    4:28 AM 12/04/2023    5:00 AM  Last 3 Weights  Weight (lbs) 278 lb 3.5 oz 273 lb 2.4 oz 271 lb  Weight (kg) 126.2 kg 123.9 kg 122.925 kg      Telemetry/ECG  Rate controlled AF avg HR ~ 63 - Personally Reviewed  Physical Exam  GEN: No acute distress.   Cardiac: Irregularly, irregular, no murmurs, rubs, or gallops.  Respiratory: Clear to auscultation bilaterally. GI: Soft, nontender, non-distended  MS: No edema  Assessment & Plan  Justin Molina. is a 63 y.o. male with a hx of paroxysmal atrial fibrillation, HFpEF, history CVA x 2, resistant hypertension, PFO, OSA, HLD, type 2 diabetes, CKD who presented to the ED on 10/26 for confusion and hyperglycemia after running out of insulin  for several days. Patient found to be in AF RVR on EMS arrival. On ED arrival AF RVR HR 124 and BG 605. Patient was started on IV fluids, insulin , and dilt. Patient admitted to IM service for HHS. Cardiology consulted.    Longstanding persistent atrial fibrillation.  Patient remains in rate controlled AF ChadsVasc Score 5   Patient has never had a DCCV, he has been compliant with his anticoagulation. Will refer him to AF clinic after discharge  about possible rhythm control. He was previously on multaq  though discontinued 04/2023 due to recurrent CHF exacerbations.    Continue dilt 300 mg Continue coreg  25 mg BID Continue Eliqius 5mg  BID   Chronic HFpEF Seen recently by AHF team, not a candidate for advanced therapies.  On exam appears, euvolemic though mild JVD. Will hold off on re-starting PTA torsemide  today, though should probably restart by discharge pending renal function.     Continue coreg  as above Continue losartan 25 mg Jardiance  on hold 2/2 dehydration, AKI on CKD, and HHS. All improving, could consider re-starting though will defer to primary as they are managing his AKI and HHS. Defer MRA 2/2 renal dysfunction though improving, would like to re-start jardiance  first.    Hypertension BP: 129/66 PTA amlodipine  dc 2/2 dilt initiation.     continue hydralazine  50 mg TID, would not up-titrate to PTA dose given BP readings.  Continue imdur  120 mg Continue losartan 25 mg Conitnue dilt as above Continue coreg  as above   Hyperlipidemia Continue lipitor  80 mg   AKI on CKD Improving, not yet at baseline   Hypernatremia Improved   Per primary T2DM with hyperglycemia/ HHS AKI on CKD BPH on cardura  OSA CVA w residual expressive aphasia  Patient has scheduled appointment with Justin Molina on  11/17, can address AF management then.    For questions or updates, please contact Justin Molina Please consult [email or web address] for contact info under       Signed, Justin LOISE Salen, PA-C   I have seen and examined the patient along with Justin LOISE Salen, PA-C .  I have reviewed the chart, notes and new data.  I agree with PA/NP's note.  Key new complaints: No cardiovascular problems Key examination changes: Atrial fibrillation with controlled ventricular response, no peripheral edema Key new findings / data: Renal function parameters have improved a little bit more and he is no longer hyponatremic.  Mild  hypokalemia noted.  PLAN: Will restart the Jardiance  today, but continue to hold his loop diuretic.  Justin Molina will sign off.   Medication Recommendations:    Apixaban  5 mg twice daily Atorvastatin  80 mg daily Carvedilol  25 mg twice daily Diltiazem sustained-release 300 mg daily (new medication) Doxazosin  8 mg at bedtime daily Isosorbide  mononitrate 120 mg daily Hydralazine  50 mg 3 times daily (new dose) Losartan 25 mg daily Jardiance  10 mg daily Amlodipine  has been stopped  His previous previous diuretic torsemide  40 mg twice daily) and potassium supplement KCl 20 mEq twice daily are currently on hold but will need to be reinstated once kidney function normalizes.  Other recommendations (labs, testing, etc): Daily weight monitoring.  Target dry weight seems to be 275-280 pounds.  He should call us  if his weight on his home scale exceeds 280 pounds and we can resume his torsemide . Follow up as an outpatient: He has an appointment with Justin Molina 12/23/2023 when we can repeat his basic metabolic panel.   Justin Botelho, MD, FACC Justin Molina [phone] 12/06/2023, 10:51 AM

## 2023-12-06 NOTE — Plan of Care (Signed)

## 2023-12-06 NOTE — TOC Transition Note (Signed)
 Transition of Care [facility]) - Discharge Note   Patient Details  Name: Justin Molina. MRN: [id number] Date of Birth: 1960/02/26  Transition of Care [facility]) CM/SW Contact:  Isaiah Public, LCSWA Phone Number: 12/06/2023, 1:10 PM   Clinical Narrative:     Patient will DC to: [facility]  Anticipated DC date: 12/06/2023  Family notified: Tonya  Transport by: [facility]  ?  Per MD patient ready for DC to [facility] . RN, patient, patient's family, and facility notified of DC. Discharge Summary sent to facility. RN given number for report tele# [phone]. If you cannot reach anyone at the telephone number please dial [phone] and then page 400 hall RN. DC packet on chart. Ambulance transport requested for patient.  CSW signing off.   Final next level of care: Skilled Nursing Facility Barriers to Discharge: No Barriers Identified   Patient Goals and CMS Choice   CMS Medicare.gov Compare Post Acute Care list provided to:: Patient Represenative (must comment) (spouse) Choice offered to / list presented to : Spouse      Discharge Placement              Patient chooses bed at:  [facility]) Patient to be transferred to facility by: [facility] Name of family member notified: Tonya Patient and family notified of of transfer: 12/06/23  Discharge Plan and Services Additional resources added to the After Visit Summary for   In-house Referral: Clinical Social Work                                   Social Drivers of Health (SDOH) Interventions SDOH Screenings   Food Insecurity: Food Insecurity Present (12/01/2023)  Housing: Low Risk  (12/01/2023)  Transportation Needs: No Transportation Needs (12/01/2023)  Utilities: At Risk (12/01/2023)  Tobacco Use: Low Risk  (12/01/2023)     Readmission Risk Interventions    04/10/2023    4:30 PM  Readmission Risk Prevention Plan  Transportation Screening Complete  PCP or Specialist Appt within 3-5 Days  Complete  HRI or Home Care Consult Complete  Palliative Care Screening Not Applicable  Medication Review (RN Care Manager) Complete

## 2023-12-06 NOTE — Assessment & Plan Note (Addendum)
 Glucose improving. Plan to discharge with Ozempic. - Continue basal 26 units, moderate SSI - AM BMP, Mg - Restarted home Jardiance  10mg  daily - PT/OT eval

## 2023-12-06 NOTE — Plan of Care (Signed)

## 2023-12-06 NOTE — Progress Notes (Signed)
 Daily Progress Note Intern Pager: [phone]  Patient name: Justin Molina. Medical record number: [id number] Date of birth: 10-21-1960 Age: 63 y.o. Gender: male  Primary Care Provider: [facility] Consultants: Cardiology Code Status: Full code  Pt Overview and Major Events to Date:  10/26-admitted  Assessment and Plan:  Justin Joslyn. is a 63 y.o.male with PMHx T2DM, HTN, CKD, HLD, aphasia, hemiplegia, hemiparesis affecting the R side, OSA, A-fib, CHF, BPH and CAD who presented with HHS, also found to be in Afib with RVR. Improving and stable for discharge to SNF. Assessment & Plan Paroxysmal A-fib (HCC) Atrial fibrillation with RVR (HCC) Chronic heart failure with preserved ejection fraction (HFpEF, >= 50%) (HCC) HR stable at 60-70 on current dose of diltiazem. Good UOP but weight up 2 kg today and patient c/o of LE edema, plan as below. Cr improving 2.34 > 2.28. Medically stable, awaiting SNF authoriazation. - K 3.4, repleted - Cardiology consulted, appreciate recommendations - Continue PO Diltiazem 300 mg daily - Continue Carvedilol  25mg  BID - Restart hydralazine  50 mg TID - Continue holding torsemide , patient can reach out to cardiology if home weight exceeds 280 lbs to restart - Continuous cardiac monitoring - Strict I's and O's, daily weights - Continue Eliquis  5 mg daily - Ted hose ordered Hyperosmolar hyperglycemic state (HHS) (HCC) Glucose improving. Plan to discharge with Ozempic. - Continue basal 26 units, moderate SSI - AM BMP, Mg - Restarted home Jardiance  10mg  daily - PT/OT eval Hypernatremia Na 144, stable. - AM BMP Chronic health problem HTN - continue home Losartan 25 mg daily, Imdur  120 mg daily, Doxazosin  8 mg daily; restarted home Hydralazine  at 50 mg q6hrs; stopped home Amlodipine  10 mg daily per cardiology HLD - continue Lipitor  80 mg every day   FEN/GI: Carb modified PPx: Eliquis  Dispo:SNF pending authorization.    Subjective:  Patient states that he is doing well.  Wife is at bedside.  No further concerns from either.  Objective: Temp:  [97.5 F (36.4 C)-99 F (37.2 C)] 97.7 F (36.5 C) (10/31 0731) Pulse Rate:  [59-76] 70 (10/31 0731) Resp:  [20-21] 20 (10/31 0731) BP: (93-134)/(66-91) 129/66 (10/31 0731) SpO2:  [92 %-98 %] 96 % (10/31 0731) Weight:  [126.2 kg] 126.2 kg (10/31 0426)  Physical Exam: General: Alert, well-appearing male in NAD.  Cardiovascular: Irregular, no m/r/g appreciated. Radial/pedal pulse +2 bilaterally Pulmonary: Normal WOB. CTAB with no w/c/r present. Abdomen: Soft, non-tender, non-distended. Extremities: Warm and well-perfused. Bilateral LE edema, L > R   Laboratory: Most recent CBC Lab Results  Component Value Date   WBC 6.8 12/03/2023   HGB 16.9 12/03/2023   HCT 53.0 (H) 12/03/2023   MCV 84.4 12/03/2023   PLT 122 (L) 12/03/2023   Most recent BMP    Latest Ref Rng & Units 12/06/2023    4:25 AM  BMP  Glucose 70 - 99 mg/dL 803   BUN 8 - 23 mg/dL 40   Creatinine 9.38 - 1.24 mg/dL 7.71   Sodium 864 - 854 mmol/L 141   Potassium 3.5 - 5.1 mmol/L 3.4   Chloride 98 - 111 mmol/L 105   CO2 22 - 32 mmol/L 24   Calcium  8.9 - 10.3 mg/dL 8.1     Other pertinent labs: Mg 2.2   Imaging/Diagnostic Tests: No new imaging  Jerrie Gathers, DO 12/06/2023, 7:53 AM  PGY-63, [facility] FPTS Intern pager: [phone], text pages welcome Secure chat group [facility] Teaching Service

## 2023-12-06 NOTE — Assessment & Plan Note (Addendum)
 Na 144, stable. - AM BMP

## 2023-12-06 NOTE — Care Management Important Message (Signed)
 Important Message  Patient Details  Name: Justin Molina. MRN: [id number] Date of Birth: 1960/09/26   Important Message Given:  Yes - Medicare IM     Justin Molina 12/06/2023, 1:08 PM

## 2023-12-06 NOTE — TOC Progression Note (Signed)
 Transition of Care (TOC) - Progression Note    Patient Details  Name: Justin Molina. MRN: [id number] Date of Birth: 1960/05/12  Transition of Care [facility]) CM/SW Contact  Luise JAYSON Pan, [place] Phone Number: 12/06/2023, 10:52 AM  Clinical Narrative:   Per Brittany with [facility], shara has been started and is still pending at this time.  CSW will continue to follow.    Expected Discharge Plan: Skilled Nursing Facility Barriers to Discharge: Continued Medical Work up               Expected Discharge Plan and Services In-house Referral: Clinical Social Work     Living arrangements for the past 2 months: Apartment                                       Social Drivers of Health (SDOH) Interventions SDOH Screenings   Food Insecurity: Food Insecurity Present (12/01/2023)  Housing: Low Risk  (12/01/2023)  Transportation Needs: No Transportation Needs (12/01/2023)  Utilities: At Risk (12/01/2023)  Tobacco Use: Low Risk  (12/01/2023)    Readmission Risk Interventions    04/10/2023    4:30 PM  Readmission Risk Prevention Plan  Transportation Screening Complete  PCP or Specialist Appt within 3-5 Days Complete  HRI or Home Care Consult Complete  Palliative Care Screening Not Applicable  Medication Review (RN Care Manager) Complete

## 2023-12-06 NOTE — Progress Notes (Signed)
 Pt discharged to SNF via [facility] transport. Pt alert and oriented x4, calm, and in no distress. VS stable prior to discharge. IV lined removed with catheter tip intact. Discharge instructions/packet sent to Emeline Breed, RN. Wife at the bedside has all personal belongings. Pt left the unit without distress.

## 2023-12-06 NOTE — TOC Progression Note (Signed)
 Transition of Care (TOC) - Progression Note    Patient Details  Name: Justin Molina. MRN: [id number] Date of Birth: [birth year]  Transition of Care [facility]) CM/SW Contact  Isaiah Public, LCSWA Phone Number: 12/06/2023, 12:47 PM  Clinical Narrative:     Zane with [facility] informed CSW that patients insurance authorization has been approved for SNF. CSW informed MD. CSW will continue to follow.  Expected Discharge Plan: Skilled Nursing Facility Barriers to Discharge: Continued Medical Work up               Expected Discharge Plan and Services In-house Referral: Clinical Social Work     Living arrangements for the past 2 months: Apartment                                       Social Drivers of Health (SDOH) Interventions SDOH Screenings   Food Insecurity: Food Insecurity Present (12/01/2023)  Housing: Low Risk  (12/01/2023)  Transportation Needs: No Transportation Needs (12/01/2023)  Utilities: At Risk (12/01/2023)  Tobacco Use: Low Risk  (12/01/2023)    Readmission Risk Interventions    04/10/2023    4:30 PM  Readmission Risk Prevention Plan  Transportation Screening Complete  PCP or Specialist Appt within 3-5 Days Complete  HRI or Home Care Consult Complete  Palliative Care Screening Not Applicable  Medication Review (RN Care Manager) Complete

## 2023-12-10 ENCOUNTER — Ambulatory Visit: Admitting: Podiatry

## 2023-12-18 ENCOUNTER — Emergency Department (HOSPITAL_COMMUNITY)

## 2023-12-18 ENCOUNTER — Inpatient Hospital Stay (HOSPITAL_COMMUNITY)
Admission: EM | Admit: 2023-12-18 | Discharge: 2023-12-20 | DRG: 291 | Disposition: A | Discharge status: Home Health | Attending: Family Medicine | Admitting: Family Medicine

## 2023-12-18 ENCOUNTER — Other Ambulatory Visit: Payer: Self-pay

## 2023-12-18 DIAGNOSIS — N189 Chronic kidney disease, unspecified: Secondary | ICD-10-CM | POA: Diagnosis present

## 2023-12-18 DIAGNOSIS — E1122 Type 2 diabetes mellitus with diabetic chronic kidney disease: Secondary | ICD-10-CM | POA: Diagnosis present

## 2023-12-18 DIAGNOSIS — M79604 Pain in right leg: Secondary | ICD-10-CM | POA: Diagnosis present

## 2023-12-18 DIAGNOSIS — Z794 Long term (current) use of insulin: Secondary | ICD-10-CM | POA: Diagnosis not present

## 2023-12-18 DIAGNOSIS — I482 Chronic atrial fibrillation, unspecified: Secondary | ICD-10-CM | POA: Diagnosis present

## 2023-12-18 DIAGNOSIS — Z789 Other specified health status: Secondary | ICD-10-CM

## 2023-12-18 DIAGNOSIS — Z91041 Radiographic dye allergy status: Secondary | ICD-10-CM | POA: Diagnosis not present

## 2023-12-18 DIAGNOSIS — I5033 Acute on chronic diastolic (congestive) heart failure: Secondary | ICD-10-CM | POA: Insufficient documentation

## 2023-12-18 DIAGNOSIS — I50813 Acute on chronic right heart failure: Principal | ICD-10-CM

## 2023-12-18 DIAGNOSIS — Z7984 Long term (current) use of oral hypoglycemic drugs: Secondary | ICD-10-CM | POA: Diagnosis not present

## 2023-12-18 DIAGNOSIS — I130 Hypertensive heart and chronic kidney disease with heart failure and stage 1 through stage 4 chronic kidney disease, or unspecified chronic kidney disease: Secondary | ICD-10-CM | POA: Diagnosis not present

## 2023-12-18 DIAGNOSIS — I2510 Atherosclerotic heart disease of native coronary artery without angina pectoris: Secondary | ICD-10-CM | POA: Diagnosis present

## 2023-12-18 DIAGNOSIS — L98499 Non-pressure chronic ulcer of skin of other sites with unspecified severity: Secondary | ICD-10-CM | POA: Diagnosis not present

## 2023-12-18 DIAGNOSIS — E119 Type 2 diabetes mellitus without complications: Secondary | ICD-10-CM

## 2023-12-18 DIAGNOSIS — Z888 Allergy status to other drugs, medicaments and biological substances status: Secondary | ICD-10-CM

## 2023-12-18 DIAGNOSIS — I5082 Biventricular heart failure: Secondary | ICD-10-CM | POA: Diagnosis present

## 2023-12-18 DIAGNOSIS — N400 Enlarged prostate without lower urinary tract symptoms: Secondary | ICD-10-CM | POA: Diagnosis present

## 2023-12-18 DIAGNOSIS — I6930 Unspecified sequelae of cerebral infarction: Secondary | ICD-10-CM

## 2023-12-18 DIAGNOSIS — I455 Other specified heart block: Secondary | ICD-10-CM | POA: Insufficient documentation

## 2023-12-18 DIAGNOSIS — Z7901 Long term (current) use of anticoagulants: Secondary | ICD-10-CM | POA: Diagnosis not present

## 2023-12-18 DIAGNOSIS — I509 Heart failure, unspecified: Secondary | ICD-10-CM | POA: Diagnosis present

## 2023-12-18 DIAGNOSIS — E785 Hyperlipidemia, unspecified: Secondary | ICD-10-CM | POA: Diagnosis present

## 2023-12-18 DIAGNOSIS — G4733 Obstructive sleep apnea (adult) (pediatric): Secondary | ICD-10-CM | POA: Diagnosis present

## 2023-12-18 DIAGNOSIS — I69351 Hemiplegia and hemiparesis following cerebral infarction affecting right dominant side: Secondary | ICD-10-CM | POA: Diagnosis not present

## 2023-12-18 DIAGNOSIS — E1169 Type 2 diabetes mellitus with other specified complication: Secondary | ICD-10-CM | POA: Diagnosis not present

## 2023-12-18 DIAGNOSIS — Z79899 Other long term (current) drug therapy: Secondary | ICD-10-CM

## 2023-12-18 DIAGNOSIS — I872 Venous insufficiency (chronic) (peripheral): Secondary | ICD-10-CM | POA: Diagnosis present

## 2023-12-18 DIAGNOSIS — I69320 Aphasia following cerebral infarction: Secondary | ICD-10-CM

## 2023-12-18 DIAGNOSIS — S81801D Unspecified open wound, right lower leg, subsequent encounter: Secondary | ICD-10-CM

## 2023-12-18 DIAGNOSIS — J9601 Acute respiratory failure with hypoxia: Secondary | ICD-10-CM | POA: Diagnosis present

## 2023-12-18 DIAGNOSIS — J90 Pleural effusion, not elsewhere classified: Secondary | ICD-10-CM | POA: Insufficient documentation

## 2023-12-18 DIAGNOSIS — M79661 Pain in right lower leg: Secondary | ICD-10-CM | POA: Diagnosis not present

## 2023-12-18 LAB — CBC WITH DIFFERENTIAL/PLATELET
Abs Immature Granulocytes: 0.01 K/uL (ref 0.00–0.07)
Basophils Absolute: 0 K/uL (ref 0.0–0.1)
Basophils Relative: 0 %
Eosinophils Absolute: 0.1 K/uL (ref 0.0–0.5)
Eosinophils Relative: 3 %
HCT: 41.3 % (ref 39.0–52.0)
Hemoglobin: 13.9 g/dL (ref 13.0–17.0)
Immature Granulocytes: 0 %
Lymphocytes Relative: 28 %
Lymphs Abs: 1.3 K/uL (ref 0.7–4.0)
MCH: 27.5 pg (ref 26.0–34.0)
MCHC: 33.7 g/dL (ref 30.0–36.0)
MCV: 81.6 fL (ref 80.0–100.0)
Monocytes Absolute: 0.5 K/uL (ref 0.1–1.0)
Monocytes Relative: 12 %
Neutro Abs: 2.6 K/uL (ref 1.7–7.7)
Neutrophils Relative %: 57 %
Platelets: 163 K/uL (ref 150–400)
RBC: 5.06 MIL/uL (ref 4.22–5.81)
RDW: 15.9 % — ABNORMAL HIGH (ref 11.5–15.5)
WBC: 4.6 K/uL (ref 4.0–10.5)
nRBC: 0 % (ref 0.0–0.2)

## 2023-12-18 LAB — COMPREHENSIVE METABOLIC PANEL WITH GFR
ALT: 10 U/L (ref 0–44)
AST: 15 U/L (ref 15–41)
Albumin: 3.1 g/dL — ABNORMAL LOW (ref 3.5–5.0)
Alkaline Phosphatase: 77 U/L (ref 38–126)
Anion gap: 12 (ref 5–15)
BUN: 22 mg/dL (ref 8–23)
CO2: 25 mmol/L (ref 22–32)
Calcium: 8.1 mg/dL — ABNORMAL LOW (ref 8.9–10.3)
Chloride: 105 mmol/L (ref 98–111)
Creatinine, Ser: 1.92 mg/dL — ABNORMAL HIGH (ref 0.61–1.24)
GFR, Estimated: 39 mL/min — ABNORMAL LOW (ref >60)
Glucose, Bld: 99 mg/dL (ref 70–99)
Potassium: 3.8 mmol/L (ref 3.5–5.1)
Sodium: 142 mmol/L (ref 135–145)
Total Bilirubin: 0.8 mg/dL (ref 0.0–1.2)
Total Protein: 7.2 g/dL (ref 6.5–8.1)

## 2023-12-18 LAB — TROPONIN I (HIGH SENSITIVITY)
Troponin I (High Sensitivity): 32 ng/L — ABNORMAL HIGH (ref <18)
Troponin I (High Sensitivity): 32 ng/L — ABNORMAL HIGH (ref <18)

## 2023-12-18 LAB — GLUCOSE, CAPILLARY: Glucose-Capillary: 169 mg/dL — ABNORMAL HIGH (ref 70–99)

## 2023-12-18 LAB — BRAIN NATRIURETIC PEPTIDE: B Natriuretic Peptide: 441.5 pg/mL — ABNORMAL HIGH (ref 0.0–100.0)

## 2023-12-18 LAB — RESP PANEL BY RT-PCR (RSV, FLU A&B, COVID)  RVPGX2
Influenza A by PCR: NEGATIVE
Influenza B by PCR: NEGATIVE
Resp Syncytial Virus by PCR: NEGATIVE
SARS Coronavirus 2 by RT PCR: NEGATIVE

## 2023-12-18 LAB — CBG MONITORING, ED: Glucose-Capillary: 96 mg/dL (ref 70–99)

## 2023-12-18 MED ORDER — CARVEDILOL 25 MG PO TABS
25.0000 mg | ORAL_TABLET | Freq: Two times a day (BID) | ORAL | Status: DC
  Administered 2023-12-19 – 2023-12-20 (×3): 25 mg via ORAL
  Filled 2023-12-18 (×3): qty 1

## 2023-12-18 MED ORDER — AMLODIPINE BESYLATE 10 MG PO TABS
10.0000 mg | ORAL_TABLET | Freq: Every day | ORAL | Status: DC
  Administered 2023-12-19 – 2023-12-20 (×2): 10 mg via ORAL
  Filled 2023-12-18 (×2): qty 1

## 2023-12-18 MED ORDER — ACETAMINOPHEN 325 MG PO TABS: 650.0000 mg | ORAL_TABLET | Freq: Four times a day (QID) | ORAL | Status: DC | PRN

## 2023-12-18 MED ORDER — ALBUTEROL SULFATE (2.5 MG/3ML) 0.083% IN NEBU: 3.0000 mL | INHALATION_SOLUTION | RESPIRATORY_TRACT | Status: DC | PRN

## 2023-12-18 MED ORDER — APIXABAN 5 MG PO TABS
5.0000 mg | ORAL_TABLET | Freq: Two times a day (BID) | ORAL | Status: DC
  Administered 2023-12-19 – 2023-12-20 (×4): 5 mg via ORAL
  Filled 2023-12-18 (×4): qty 1

## 2023-12-18 MED ORDER — LOSARTAN POTASSIUM 25 MG PO TABS
25.0000 mg | ORAL_TABLET | Freq: Every day | ORAL | Status: DC
  Administered 2023-12-19 – 2023-12-20 (×2): 25 mg via ORAL
  Filled 2023-12-18 (×2): qty 1

## 2023-12-18 MED ORDER — ISOSORBIDE MONONITRATE ER 60 MG PO TB24
120.0000 mg | ORAL_TABLET | Freq: Every day | ORAL | Status: DC
  Administered 2023-12-19 – 2023-12-20 (×2): 120 mg via ORAL
  Filled 2023-12-18 (×2): qty 2

## 2023-12-18 MED ORDER — FUROSEMIDE 10 MG/ML IJ SOLN
80.0000 mg | Freq: Once | INTRAMUSCULAR | Status: AC
  Administered 2023-12-19: 80 mg via INTRAVENOUS
  Filled 2023-12-18: qty 8

## 2023-12-18 MED ORDER — DILTIAZEM HCL ER COATED BEADS 180 MG PO CP24
300.0000 mg | ORAL_CAPSULE | Freq: Every day | ORAL | Status: DC
  Administered 2023-12-19 – 2023-12-20 (×2): 300 mg via ORAL
  Filled 2023-12-18 (×2): qty 1

## 2023-12-18 MED ORDER — ACETAMINOPHEN 650 MG RE SUPP
650.0000 mg | Freq: Four times a day (QID) | RECTAL | Status: DC | PRN
Start: 2023-12-18 — End: 2023-12-20

## 2023-12-18 MED ORDER — EMPAGLIFLOZIN 10 MG PO TABS
10.0000 mg | ORAL_TABLET | Freq: Every day | ORAL | Status: DC
  Administered 2023-12-19 – 2023-12-20 (×2): 10 mg via ORAL
  Filled 2023-12-18 (×2): qty 1

## 2023-12-18 MED ORDER — ATORVASTATIN CALCIUM 80 MG PO TABS
80.0000 mg | ORAL_TABLET | Freq: Every day | ORAL | Status: DC
  Administered 2023-12-19 (×2): 80 mg via ORAL
  Filled 2023-12-18 (×2): qty 1

## 2023-12-18 MED ORDER — INSULIN ASPART 100 UNIT/ML IJ SOLN
0.0000 [IU] | Freq: Three times a day (TID) | INTRAMUSCULAR | Status: DC
  Administered 2023-12-19: 1 [IU] via SUBCUTANEOUS
  Administered 2023-12-19: 2 [IU] via SUBCUTANEOUS
  Administered 2023-12-19 – 2023-12-20 (×3): 1 [IU] via SUBCUTANEOUS
  Filled 2023-12-18: qty 2
  Filled 2023-12-18 (×4): qty 1

## 2023-12-18 MED ORDER — FUROSEMIDE 10 MG/ML IJ SOLN
80.0000 mg | Freq: Once | INTRAMUSCULAR | Status: AC
  Administered 2023-12-18: 80 mg via INTRAVENOUS
  Filled 2023-12-18: qty 8

## 2023-12-18 NOTE — Assessment & Plan Note (Addendum)
-   Admit to FMTS, attending Dr. McDiarmid  - Vital signs per floor - Pain control: Tylenol  1000 mg Q6H PRN  - Bowel Reg: MiraLax  and Senna - AM Labs: CBC, BMP, Mg  - S/p 80 mg lasix  w/ good respond - Redose 80 mg lasix  in AM then transition to P.O when able - Daily weight - Strict I/O - Continue his current GDMT (Carvedilol , Losartan, Jardiance ) - Fall precautions - Delirium precautions - PT/OT consult

## 2023-12-18 NOTE — Hospital Course (Addendum)
 Justin Molina. is a 63 y.o.male with a history of T2DM, HTN, CKD, HLD, aphasia, hemiplegia and hemiparesis affecting the right side, OSA, A-fib, CHF (EF 50-55%), BPH, CAD who was admitted to the [facility] Teaching Service at [facility] for CHF exacerbation. His hospital course is detailed below:  Acute exacerbation of CHF Patient was recently D/C from our service 11/2023 for A fib RVR and HHS. At discharge, he was instructed to hold his Torsemide  until he could follow-up with cardiology.  Patient unable to follow-up.  Prior to presentation, was having orthopnea and worsening lower extremity edema; reported nearly 20 pound weight gain in prior 2 days.  At admission, also had O2 requirement and dyspnea.  Was diuresed with IV Lasix  and weaned to room air.  Continue to receive diuresis with improvement in symptoms; transitioned to p.o. diuresis with torsemide  40 mg daily on 11/14 and discharged on same.  Continued on home GDMT (Losartan 25 mg daily, Jardiance  10 mg daily, Imdur  120 mg daily); carvedilol  was deceased from 25 mg BID to 12.5 mg BID given a few brief sinus pauses seen overnight. Cards followup 11/17   Right lower extremity pain Nonhealing leg wounds Had right leg pain and R>L edema on admission; had been compliant with Eliquis  at home, but given findings concern for potential DVT.  Bilateral lower extremity ultrasound was done 11/13, which showed no evidence of DVT bilaterally.  He had been receiving Unna boot to the right leg outpatient, which was resumed inpatient.  Other chronic conditions were medically managed with home medications and formulary alternatives as necessary (T2DM, A-fib, HTN, HLD)  PCP Follow-up Recommendations: Ensure follow-up with wound care for nonhealing leg wounds Consider outpatient sleep study Consider referral to Endocrinology (wife asking about this inpatient) Placed HHPT order at discharge; also placed [facility] order at wife's request for help with medication management  (unsure if this will be approved by insurance, wife was asking about assistance with care in the home)

## 2023-12-18 NOTE — ED Notes (Signed)
 Assuming pt care, pt bib ems coming from home for sob, bilat leg swelling, pt aaox4, expressive aphasia and RT side weakness from previous stroke, uses a walker to ambulate, skin warm/dry, pt denies pain/discomfort, wife at bedside, call bell within reach.

## 2023-12-18 NOTE — H&P (Addendum)
 Hospital Admission History and Physical Service Pager: [phone]  Patient name: Justin Molina. Medical record number: [id number] Date of Birth: 05-19-1960 Age: 63 y.o. Gender: male  Primary Care Provider: [facility]  Consultants: None Code Status: Full Code  which was confirmed with family if patient unable to confirm   Preferred Emergency Contact:     Name Relation Home Work Mobile    [place] Spouse [phone]   [phone]    Ante,narkeya Daughter     [phone]    Noecker,Renesha Daughter [phone]   [phone]    Chief Complaint: SOB , increase Oxygen requirement   Differential and Medical Decision Making:  Justin Kloss. is a 63 y.o. male with PMH T2DM, HTN, CKD, HLD, aphasia, hemiplegia and hemiparesis affecting the right side, OSA, A-fib, CHF (EF 50-55%), BPH, CAD presenting with SOB increase oxygen requirement.   Differential for this patient's presentation of this includes  CHF exacerbation/volume overload state/pleural effusion: Pt w/ increase oxygen requirement. CXR show fluid on the LLL. In ED labs showed BNP of 441.5 and troponin 32. Creatinine is baseline 1.9. Patient was given IV Lasix  80 ml   Acute Respiratory failure should not be excluded considering pt present w/ SOB and Hx of CHF, however pt oriented x4, no confusion, and not tachycardia - HR in 60s  Infection : Less likely. Tm 98.48F since arrive to ED. Denies s/s of infection. Normal WBC of 4.6 Assessment & Plan Acute exacerbation of CHF (congestive heart failure) (HCC) - Admit to FMTS, attending Dr. McDiarmid  - Vital signs per floor - Pain control: Tylenol  1000 mg Q6H PRN  - Bowel Reg: MiraLax  and Senna - AM Labs: CBC, BMP, Mg  - S/p 80 mg lasix  w/ good respond - Redose 80 mg lasix  in AM then transition to P.O when able - Daily weight - Strict I/O - Continue his current GDMT (Carvedilol , Losartan, Jardiance ) - Fall precautions - Delirium precautions - PT/OT  consult  Right leg pain Right leg pain and edema R>L. Pt on Eliquis  5 mg BID at home  - Pending Bilateral LE US  to rule out DVT Type 2 diabetes mellitus (HCC) Last A1c 9.3 -Hold ozempic -Continue jardiance  -SSI, adjust as needed Chronic health problem A-fib : continue eliquis  5 mg, coreg  25 mg BID, Diltiazem 300 mg HTN: continue home losartan 25 mg daily, imdur  120 mg daily, doxazosin  8 mg and amlodipine  10 mg daily; holding hydralazine  100 mg TID with patient's BP not significantly elevated without meds this morning HLD: continue lipitor  80 mg every day  FEN/GI: Heart diet VTE Prophylaxis: Home Eliquis   Disposition: Tele   History of Present Illness:  Justin Blankenhorn. is a 63 y.o. male with PMH T2DM, HTN, CKD, HLD, aphasia, hemiplegia and hemiparesis affecting the right side, OSA, A-fib, CHF (EF 50-55%), BPH, CAD presenting with SOB increase oxygen requirement.  Patient was recently D/C from our service last month for A fib RVR and HHS to SNF.   At discharge he was instructed to hold his Torsemide  until his f/u.  Patient with expressive aphasia, history primary provided by spouse. Per his wife, he was initially doing well after discharge. However, since then patient was unable to lay down flat w/ worsening lower extremities edema. Was doing well for a couple of days after discharge. He was discharged home on Monday 11/10 since then spouse noticed worsening fatigue, orthopnea and leg edema. Reported nearly 20 lb weight gain in last 24-48 hrs.   In  the ED, he is hemodynamically stable, required 2L of Oxygen to maintain Oxy sat greater than 92%. CXR show fluid on LLL. Pertinent labs include sCr 1.92, BNP 441.5 and Trop 32  Review Of Systems: Per HPI   Pertinent Past Medical History: T2DM HTN CKD HLD Aphasia Hemiplegia  Hemiparesis affecting the right side OSA A-fib CHF (EF 50-55%) BPH CAD  Remainder reviewed in history tab.   Pertinent Past Surgical History: Brain  surgery PEG tube placement Hernia Repair Tracheostomy Remainder reviewed in history tab.  Pertinent Social History: Tobacco use: No Alcohol  use: Denies Other Substance use: Denies Lives with Wife  Pertinent Family History: None contributing  Important Outpatient Medications: Amlodipine  10 mg daily Atorvastatin  80 mg daily Diltiazem 300 mg daily Eliquis  5 mg twice daily Lipitor  80 mg nightly Coreg  25 mg twice daily Doxazosin  8 mg nightly Jardiance  10 mg daily Hydralazine  100 mg, patient taking twice daily Imdur  120 mg daily Losartan 25 mg daily Potassium supplementation 20 mEq  Torsemide  60 BID : Paused since discharged on 12/06/23 Remainder reviewed in medication history.   Objective: BP 132/83 (BP Location: Left Arm)   Pulse 69   Temp 98.4 F (36.9 C) (Oral)   Resp (!) 21   Wt 131.6 kg   SpO2 99%   BMI 40.46 kg/m  Exam: Physical Exam Cardiovascular:     Rate and Rhythm: Normal rate. Rhythm irregular.     Pulses: Normal pulses.  Pulmonary:     Effort: Pulmonary effort is normal.     Breath sounds: Rhonchi present.     Comments: On 2L Rayle Abdominal:     Palpations: Abdomen is soft.  Neurological:     Mental Status: He is alert and oriented to person, place, and time.     Comments: R side weakness  Psychiatric:        Mood and Affect: Mood normal.        Behavior: Behavior normal.        Thought Content: Thought content normal.        Judgment: Judgment normal.      Labs:  CBC BMET  Recent Labs  Lab 12/18/23 1639  WBC 4.6  HGB 13.9  HCT 41.3  PLT 163   Recent Labs  Lab 12/18/23 1639  NA 142  K 3.8  CL 105  CO2 25  BUN 22  CREATININE 1.92*  GLUCOSE 99  CALCIUM  8.1*     EKG: rated controled A-fib    Imaging Studies Performed:  CLINICAL DATA:  Shortness of breath.   EXAM: CHEST - 2 VIEW   COMPARISON:  Chest pain of the 12/01/2023.   FINDINGS: Shallow inspiration. No focal consolidation, pleural effusion or pneumothorax. Mild  cardiomegaly. No acute osseous pathology.   IMPRESSION: 1. No active cardiopulmonary disease. 2. Mild cardiomegaly.     Electronically Signed   By: Vanetta Chou M.D.   On: 12/18/2023 17:01   Suzen Houston NOVAK, DO 12/18/2023, 7:08 PM PGY-1, [facility]  FPTS Intern pager: [phone], text pages welcome Secure chat group [facility] Teaching Service   Upper Level Addendum: I have seen and evaluated this patient along with Dr. Coralee and reviewed the above note, making necessary revisions as appropriate. I agree with the medical decision making and physical exam as noted above. Gladis Church, DO PGY-3 [facility] Family [facility] Residency

## 2023-12-18 NOTE — ED Notes (Signed)
 Report called to [facility] for RM 6E 15

## 2023-12-18 NOTE — ED Notes (Signed)
 Will page out to FM at 7pm

## 2023-12-18 NOTE — ED Provider Notes (Signed)
 [place] EMERGENCY DEPARTMENT AT [facility] Provider Note   CSN: [id number] Arrival date & time: 12/18/23  1332     History No chief complaint on file.   HPI: Justin Molina. is a 63 y.o. male with history perinent for HTN, A-fib, CAD, HFpEF, OSA, T2DM, prior stroke with aphasia who presents complaining of shortness of breath, bilateral lower extremity swelling. Patient arrived via EMS from home.  History provided by patient.  No interpreter required during this encounter.  Patient reports that he has had shortness of breath for approximately 1 week, however it is worsened over the past several days.  Reports that he is not on baseline oxygen, however was placed on oxygen and route with EMS which improved his shortness of breath.  Reports that he had worsening bilateral lower extremity swelling over the past several days.  Reports that he is not currently on a diuretic.  Denies fever, chills, chest pain, nausea, vomiting, diarrhea, abdominal pain  Patient's recorded medical, surgical, social, medication list and allergies were reviewed in the Snapshot window as part of the initial history.   Prior to Admission medications   Medication Sig Start Date End Date Taking? Authorizing Provider  acetaminophen  (TYLENOL ) 500 MG tablet Take 1 tablet (500 mg total) by mouth every 6 (six) hours as needed. Patient taking differently: Take 1,000 mg by mouth daily as needed for mild pain (pain score 1-3) or moderate pain (pain score 4-6). 12/26/14  Yes Pickering, Vrinda, NP  albuterol  (VENTOLIN  HFA) 108 (90 Base) MCG/ACT inhaler Inhale 2 puffs into the lungs every 4 (four) hours as needed for wheezing or shortness of breath. 09/24/22  Yes [provider]  amLODipine  (NORVASC ) 10 MG tablet Take 10 mg by mouth daily.   Yes [provider]  atorvastatin  (LIPITOR ) 80 MG tablet Take 1 tablet (80 mg total) by mouth at bedtime. 04/16/23  Yes Arrien, Mauricio Daniel, MD  diltiazem  (CARDIZEM CD) 300 MG 24 hr capsule Take 1 capsule (300 mg total) by mouth daily. 12/06/23  Yes Bhagat, Virali, DO  doxazosin  (CARDURA ) 4 MG tablet Take 4 mg by mouth at bedtime.   Yes [provider]  ELIQUIS  5 MG TABS tablet Take 1 tablet (5 mg total) by mouth 2 (two) times daily. 11/17/21  Yes Lynwood Lenis, PA-C  empagliflozin  (JARDIANCE ) 10 MG TABS tablet Take 1 tablet (10 mg total) by mouth daily. 04/16/23  Yes Arrien, Mauricio Daniel, MD  fluticasone [facility]) 50 MCG/ACT nasal spray Place 2 sprays into both nostrils daily as needed for allergies or rhinitis. 09/24/22  Yes [provider]  hydrALAZINE  (APRESOLINE ) 50 MG tablet Take 1 tablet (50 mg total) by mouth every 6 (six) hours. 12/06/23  Yes Bhagat, Virali, DO  isosorbide  mononitrate (IMDUR ) 120 MG 24 hr tablet Take 1 tablet (120 mg total) by mouth daily. 04/16/23 07/09/24 Yes Arrien, Elidia Sieving, MD  loratadine  (CLARITIN ) 10 MG tablet Take 10 mg by mouth daily as needed for allergies or rhinitis. 11/06/22  Yes [provider]  losartan (COZAAR) 25 MG tablet Take 25 mg by mouth daily. 09/24/23  Yes [provider]  nystatin cream (MYCOSTATIN) Apply 1 Application topically daily as needed (yeast).   Yes [provider]  potassium chloride  SA (KLOR-CON  M) 20 MEQ tablet Take 1 tablet (20 mEq total) by mouth daily. 12/06/23  Yes Bhagat, Virali, DO  carvedilol  (COREG ) 12.5 MG tablet Take 1 tablet (12.5 mg total) by mouth 2 (two) times daily with a  meal. 12/20/23   Cleotilde Perkins, DO  glucose blood (ACCU-CHEK AVIVA PLUS) test strip Use as instructed for TID and QHS blood glucose testing 05/23/15   Jegede, Olugbemiga E, MD  Lancets 28G MISC Check blood sugar TID & QHS 05/23/15   Jegede, Olugbemiga E, MD  OZEMPIC, 0.25 OR 0.5 MG/DOSE, 2 MG/3ML SOPN Inject 0.25 mg into the skin every Sunday. Patient not taking: Reported on 12/01/2023 12/27/22   [provider]  torsemide  (DEMADEX ) 20 MG tablet Take 2  tablets (40 mg total) by mouth daily. Can take an additional tablet prn shortness of breath 12/20/23   Cleotilde Perkins, DO     Allergies: Contrast media [iodinated contrast media] and Iodine   Review of Systems   ROS as per HPI  Physical Exam Updated Vital Signs BP 136/83 (BP Location: Right Arm)   Pulse 72   Temp 98.3 F (36.8 C) (Oral)   Resp 20   Ht 5' 11 (1.803 m)   Wt 129 kg   SpO2 96%   BMI 39.68 kg/m  Physical Exam Vitals and nursing note reviewed.  Constitutional:      General: He is not in acute distress.    Appearance: He is well-developed.  HENT:     Head: Normocephalic and atraumatic.  Eyes:     Conjunctiva/sclera: Conjunctivae normal.  Cardiovascular:     Rate and Rhythm: Normal rate and regular rhythm.     Heart sounds: No murmur heard. Pulmonary:     Effort: Pulmonary effort is normal. No respiratory distress.     Breath sounds: Rales present.  Abdominal:     Palpations: Abdomen is soft.     Tenderness: There is no abdominal tenderness.  Musculoskeletal:        General: No swelling.     Cervical back: Neck supple.     Right lower leg: Edema (3+) present.     Left lower leg: Edema (3+) present.  Skin:    General: Skin is warm and dry.     Capillary Refill: Capillary refill takes less than 2 seconds.  Neurological:     Mental Status: He is alert.  Psychiatric:        Mood and Affect: Mood normal.     ED Course/ Medical Decision Making/ A&P    Procedures Procedures   Medications Ordered in ED Medications  potassium chloride  10 mEq in 100 mL IVPB ( Intravenous Canceled Entry 12/19/23 1400)  furosemide  (LASIX ) injection 80 mg (80 mg Intravenous Given 12/18/23 1707)  furosemide  (LASIX ) injection 80 mg (80 mg Intravenous Given 12/19/23 0511)  potassium chloride  SA (KLOR-CON  M) CR tablet 40 mEq (40 mEq Oral Given 12/19/23 0826)  magnesium sulfate IVPB 2 g 50 mL (2 g Intravenous New Bag/Given 12/19/23 0824)  potassium chloride  10 mEq in 100 mL  IVPB (10 mEq Intravenous New Bag/Given 12/19/23 1356)  potassium chloride  (KLOR-CON ) packet 40 mEq (40 mEq Oral Given 12/19/23 2051)  potassium chloride  SA (KLOR-CON  M) CR tablet 40 mEq (40 mEq Oral Given 12/20/23 0905)    Medical Decision Making:   Justin Catha Raddle. is a 63 y.o. male who presents for shortness of breath as per above.  Physical exam is pertinent for 3+ bilateral lower extremity swelling.   The differential includes but is not limited to heart failure, ACS, arrhythmia, pericardial tamponade, pericarditis, myocarditis, pneumonia, pneumothorax, esophageal, tear, perforated abdominal viscous, pulmonary embolism, aortic dissection, costochondritis, musculoskeletal chest wall pain, GERD.  Independent historian: None  External data reviewed: Notes:  Reviewed notes from patient's recent admission, does not appear that patient was on oxygen at that time or upon his discharge on 10/21  Labs: Ordered, pending at the time of handoff  Radiology: Ordered, Independent interpretation, Details: No focal airspace opacification, cardiomediastinal silhouette gentian, pneumothorax, pleural effusion, bony derangement, patient does have increased interstitial markings bilaterally concerning for pulmonary edema, and All images reviewed independently.  Agree with radiology report at this time.    DG Chest 2 View Result Date: 12/18/2023 CLINICAL DATA:  Shortness of breath. EXAM: CHEST - 2 VIEW COMPARISON:  Chest pain of the 12/01/2023. FINDINGS: Shallow inspiration. No focal consolidation, pleural effusion or pneumothorax. Mild cardiomegaly. No acute osseous pathology. IMPRESSION: 1. No active cardiopulmonary disease. 2. Mild cardiomegaly. Electronically Signed   By: Vanetta Chou M.D.   On: 12/18/2023 17:01    EKG/Medicine tests: Ordered, Independent interpretation, EKG Interpretation Date/Time:  Wednesday December 18 2023 15:19:52 EST Ventricular Rate:  60 PR Interval:    QRS  Duration:  121 QT Interval:  466 QTC Calculation: 466 R Axis:   -55  Text Interpretation: Atrial fibrillation, LBBB  Interventions: Lasix   See the EMR for full details regarding lab and imaging results.   Patient alert, well-appearing on exam.  Does have oxygen requirement of 3 L here in the emergency department, which on review of patient's prior discharge summary does not appear to be baseline, though unclear from triage note.  Patient's ability to provide his history is limited by his expressive aphasia.  Patient does have evidence of volume overload with 3+ pitting edema bilaterally as well as rales on exam.  Do feel that labs, chest x-ray, as well as Lasix  are indicated.  Patient has previously been on 20 of p.o. torsemide , therefore 80 IV Lasix  ordered.  At the time of handoff, patient continues to require oxygen for hypoxic respiratory failure, however labs are pending.  Plan at the time of signout, follow-up labs, anticipate admission.  Presentation is most consistent with acute complicated illness  Discussion of management or test interpretations with external provider(s): None by the time of handoff  Risk Drugs:Prescription drug management Treatment: Pending at the time of handoff, anticipate admission  Disposition: HANDOFF: At the time of signout, the patients labs had not yet been completed. I transferred care of the patient at the time of signout to Dr. Patt. I informed the incoming care provider of the patient's history, status, and management plan. I addressed all of their concerns and/or questions to the best of my ability. Please refer to the incoming care provider's note for details regarding the remainder of the patient's ED course and disposition.  MDM generated using voice dictation software and may contain dictation errors.  Please contact me for any clarification or with any questions.  Clinical Impression:  1. Acute on chronic right-sided congestive heart failure  (HCC)   2. Atrial fibrillation, chronic (HCC)   3. Hemiplegia and hemiparesis following cerebral infarction affecting right dominant side (HCC)   4. History of CVA with residual deficit   5. Open wound of right lower extremity, subsequent encounter      Admit   Final Clinical Impression(s) / ED Diagnoses Final diagnoses:  Acute on chronic right-sided congestive heart failure (HCC)  Atrial fibrillation, chronic (HCC)  Hemiplegia and hemiparesis following cerebral infarction affecting right dominant side (HCC)  History of CVA with residual deficit  Open wound of right lower extremity, subsequent encounter    Rx / DC Orders ED Discharge Orders  Ordered    carvedilol  (COREG ) 12.5 MG tablet  2 times daily with meals        12/20/23 1136    torsemide  (DEMADEX ) 20 MG tablet  Daily       Note to Pharmacy: Decrease in dose   12/20/23 1136    Home Health        12/20/23 1647    Face-to-face encounter (required for Medicare/Medicaid patients)       Comments: I Damien Pinal certify that this patient is under my care and that I, or a nurse practitioner or physician's assistant working with me, had a face-to-face encounter that meets the physician face-to-face encounter requirements with this patient on 12/20/2023. The encounter with the patient was in whole, or in part for the following medical condition(s) which is the primary reason for home health care (List medical condition): chronic right sided hemiplegia, acute heart failure exacerbation   12/20/23 1647             Rogelia Jerilynn RAMAN, MD 12/20/23 2350

## 2023-12-18 NOTE — Assessment & Plan Note (Signed)
 Last A1c 9.3 -Hold ozempic -Continue jardiance  -SSI, adjust as needed

## 2023-12-18 NOTE — ED Triage Notes (Signed)
 Pt BIB [facility] from home with wife. EMS reported patient discharge from rehab the diuretic was restarted. He has bilateral swelling the the right more than the left.Reported rales in RLL, decrease breath sounds and distended abdomen. Patient oxygen at baseline. He was placed on 96% and decided to place him on oxygen 2Liter and oxygen went to 100. Pt have hx of stroke, Afib, DM, right sided weakness and expressive aphasia.  He is on Eliquis . Ems reported while moving him to the stretcher it role out from behind the patient they guided him to the floor. They reported he did not hit his head and no loc. Vital signs: Bp 157/96, pulse 70 Blood sugar 250. EMS said they were told he was taken off insuline.

## 2023-12-18 NOTE — Assessment & Plan Note (Addendum)
 Right leg pain and edema R>L. Pt on Eliquis  5 mg BID at home  - Pending Bilateral LE US  to rule out DVT

## 2023-12-18 NOTE — ED Provider Notes (Addendum)
  Physical Exam  BP 132/83 (BP Location: Left Arm)   Pulse 69   Temp 98.4 F (36.9 C) (Oral)   Resp (!) 21   Wt 131.6 kg   SpO2 99%   BMI 40.46 kg/m   Physical Exam  Procedures  Procedures  ED Course / MDM    Medical Decision Making Care assumed at 4 pm.  Patient is here with leg swelling and hypoxia.  Signed out pending labs and admission  6:51 PM Labs showed BNP of 400 and troponin 32.  Creatinine is baseline 1.9.  Patient was given IV Lasix .  Family practice to admit for heart failure exacerbation  Problems Addressed: Acute on chronic right-sided congestive heart failure [facility]): chronic illness or injury with exacerbation, progression, or side effects of treatment  Amount and/or Complexity of Data Reviewed Labs: ordered. Radiology: ordered.  Risk Prescription drug management.          Patt Alm Macho, MD 12/18/23 CLAIR    Patt Alm Macho, MD 12/18/23 [phone]

## 2023-12-18 NOTE — Assessment & Plan Note (Addendum)
 A-fib : continue eliquis  5 mg, coreg  25 mg BID, Diltiazem 300 mg HTN: continue home losartan 25 mg daily, imdur  120 mg daily, doxazosin  8 mg and amlodipine  10 mg daily; holding hydralazine  100 mg TID with patient's BP not significantly elevated without meds this morning HLD: continue lipitor  80 mg every day

## 2023-12-19 ENCOUNTER — Encounter (HOSPITAL_COMMUNITY): Payer: Self-pay

## 2023-12-19 ENCOUNTER — Inpatient Hospital Stay (HOSPITAL_COMMUNITY)

## 2023-12-19 DIAGNOSIS — Z794 Long term (current) use of insulin: Secondary | ICD-10-CM | POA: Diagnosis not present

## 2023-12-19 DIAGNOSIS — I5033 Acute on chronic diastolic (congestive) heart failure: Secondary | ICD-10-CM | POA: Diagnosis not present

## 2023-12-19 DIAGNOSIS — L98499 Non-pressure chronic ulcer of skin of other sites with unspecified severity: Secondary | ICD-10-CM

## 2023-12-19 DIAGNOSIS — I872 Venous insufficiency (chronic) (peripheral): Secondary | ICD-10-CM

## 2023-12-19 DIAGNOSIS — J90 Pleural effusion, not elsewhere classified: Secondary | ICD-10-CM | POA: Insufficient documentation

## 2023-12-19 DIAGNOSIS — E1169 Type 2 diabetes mellitus with other specified complication: Secondary | ICD-10-CM

## 2023-12-19 DIAGNOSIS — M79661 Pain in right lower leg: Secondary | ICD-10-CM

## 2023-12-19 LAB — BASIC METABOLIC PANEL WITH GFR
Anion gap: 11 (ref 5–15)
Anion gap: 9 (ref 5–15)
BUN: 20 mg/dL (ref 8–23)
BUN: 21 mg/dL (ref 8–23)
CO2: 23 mmol/L (ref 22–32)
CO2: 26 mmol/L (ref 22–32)
Calcium: 8 mg/dL — ABNORMAL LOW (ref 8.9–10.3)
Calcium: 8 mg/dL — ABNORMAL LOW (ref 8.9–10.3)
Chloride: 107 mmol/L (ref 98–111)
Chloride: 106 mmol/L (ref 98–111)
Creatinine, Ser: 1.82 mg/dL — ABNORMAL HIGH (ref 0.61–1.24)
Creatinine, Ser: 1.94 mg/dL — ABNORMAL HIGH (ref 0.61–1.24)
GFR, Estimated: 41 mL/min — ABNORMAL LOW (ref >60)
GFR, Estimated: 38 mL/min — ABNORMAL LOW (ref >60)
Glucose, Bld: 144 mg/dL — ABNORMAL HIGH (ref 70–99)
Glucose, Bld: 99 mg/dL (ref 70–99)
Potassium: 3.2 mmol/L — ABNORMAL LOW (ref 3.5–5.1)
Potassium: 3.7 mmol/L (ref 3.5–5.1)
Sodium: 141 mmol/L (ref 135–145)
Sodium: 141 mmol/L (ref 135–145)

## 2023-12-19 LAB — GLUCOSE, CAPILLARY
Glucose-Capillary: 132 mg/dL — ABNORMAL HIGH (ref 70–99)
Glucose-Capillary: 163 mg/dL — ABNORMAL HIGH (ref 70–99)
Glucose-Capillary: 146 mg/dL — ABNORMAL HIGH (ref 70–99)
Glucose-Capillary: 130 mg/dL — ABNORMAL HIGH (ref 70–99)
Glucose-Capillary: 143 mg/dL — ABNORMAL HIGH (ref 70–99)

## 2023-12-19 LAB — MAGNESIUM: Magnesium: 1.8 mg/dL (ref 1.7–2.4)

## 2023-12-19 MED ORDER — POTASSIUM CHLORIDE 10 MEQ/100ML IV SOLN
10.0000 meq | INTRAVENOUS | Status: AC
  Administered 2023-12-19 (×3): 10 meq via INTRAVENOUS
  Filled 2023-12-19 (×3): qty 100

## 2023-12-19 MED ORDER — POTASSIUM CHLORIDE 20 MEQ PO PACK
40.0000 meq | PACK | Freq: Once | ORAL | Status: AC
  Administered 2023-12-19: 40 meq via ORAL
  Filled 2023-12-19: qty 2

## 2023-12-19 MED ORDER — POTASSIUM CHLORIDE 10 MEQ/100ML IV SOLN
10.0000 meq | Freq: Once | INTRAVENOUS | Status: AC
  Administered 2023-12-19: 10 meq via INTRAVENOUS
  Filled 2023-12-19: qty 100

## 2023-12-19 MED ORDER — MAGNESIUM SULFATE 2 GM/50ML IV SOLN
2.0000 g | Freq: Once | INTRAVENOUS | Status: AC
  Administered 2023-12-19: 2 g via INTRAVENOUS
  Filled 2023-12-19: qty 50

## 2023-12-19 MED ORDER — POTASSIUM CHLORIDE CRYS ER 20 MEQ PO TBCR
40.0000 meq | EXTENDED_RELEASE_TABLET | Freq: Once | ORAL | Status: AC
  Administered 2023-12-19: 40 meq via ORAL
  Filled 2023-12-19: qty 2

## 2023-12-19 NOTE — Progress Notes (Signed)
 Patient's wife states that EMTs dropped patient in kitchen while transporting patient out of the house. She states it took 5 people to get him back on the transport bed. She says he did not hit his head but he did fall on his coccyx. Patient states he is not in pain any where. He states that he was not hurting anywhere at the time of the fall.

## 2023-12-19 NOTE — Assessment & Plan Note (Addendum)
 Out 3L yesterday s/p IV Lasix  80 mg. CXR this am with small bilateral pleural effusions. Dry weight potentially ~126.2kg (weight at time of discharge from last hosp); 128.3kg today. - Additional IV Lasix  80 mg today - Transition to po torsemide  80 mg as able, possibly tomorrow 11/14 - Pain control: Tylenol  650 mg Q6H PRN  - Continue home GDMT: Carvedilol  25 mg BID, Losartan 25 mg daily, Jardiance  10 mg daily, Imdur  120 mg daily - AM Labs: BMP, Mag - Repleting electrolytes as indicated; given K Cl 40 mEq po and 40 mEq IV, Mag 2 g IV - Repeat BMP at 1700 to assess K - Daily weights and strict I/O - Fall precautions - Delirium precautions - PT/OT consult  - Patient needs PCP; TOC consulted to assist with this

## 2023-12-19 NOTE — Progress Notes (Signed)
 OT Cancellation Note  Patient Details Name: Justin Molina. MRN: [id number] DOB: [birth year]   Cancelled Treatment:    Reason Eval/Treat Not Completed: Medical issues which prohibited therapy. Awaiting doppler to rule out DVT. OT will follow up next available time as appropriate   Jacques Karna Loose 12/19/2023, 10:21 AM

## 2023-12-19 NOTE — Assessment & Plan Note (Addendum)
 A-fib : continue eliquis  5 mg BID, coreg  25 mg BID, Diltiazem 300 mg daily HTN: continue home losartan 25 mg daily, imdur  120 mg daily, doxazosin  8 mg and amlodipine  10 mg daily; holding hydralazine  100 mg TID HLD: continue lipitor  80 mg every day

## 2023-12-19 NOTE — Progress Notes (Signed)
 [employer] in place on RLE.

## 2023-12-19 NOTE — Plan of Care (Signed)
   Problem: Education: Goal: Ability to describe self-care measures that may prevent or decrease complications (Diabetes Survival Skills Education) will improve Outcome: Progressing Goal: Individualized Educational Video(s) Outcome: Progressing   Problem: Coping: Goal: Ability to adjust to condition or change in health will improve Outcome: Progressing

## 2023-12-19 NOTE — Progress Notes (Signed)
 Daily Progress Note Intern Pager: [phone]  Patient name: Justin Molina. Medical record number: [id number] Date of birth: 10-03-1960 Age: 63 y.o. Gender: male  Primary Care Provider: [facility] Consultants: None Code Status: Full  Pt Overview and Major Events to Date:  - 11/12: Admitted  Medical Decision Making:  Charlie Catha Raddle. is a 63 y.o. male with PMH T2DM, HTN, CKD, HLD, expressive aphasia, hemiplegia and hemiparesis affecting the right side, OSA, A-fib, CHF (EF 50-55%), BPH, CAD. Admitted with dyspnea and hypoxia 2/2 acute CHF exacerbation. Treating with diuresis. Assessment & Plan Acute exacerbation of CHF (congestive heart failure) (HCC) Bilateral pleural effusion Out 3L yesterday s/p IV Lasix  80 mg. CXR this am with small bilateral pleural effusions. Dry weight potentially ~126.2kg (weight at time of discharge from last hosp); 128.3kg today. - Additional IV Lasix  80 mg today - Transition to po torsemide  80 mg as able, possibly tomorrow 11/14 - Pain control: Tylenol  650 mg Q6H PRN  - Continue home GDMT: Carvedilol  25 mg BID, Losartan 25 mg daily, Jardiance  10 mg daily, Imdur  120 mg daily - AM Labs: BMP, Mag - Repleting electrolytes as indicated; given K Cl 40 mEq po and 40 mEq IV, Mag 2 g IV - Repeat BMP at 1700 to assess K - Daily weights and strict I/O - Fall precautions - Delirium precautions - PT/OT consult  - Patient needs PCP; TOC consulted to assist with this Right leg pain Right leg pain and edema R>L. Pt on Eliquis  5 mg BID at home  - Pending Bilateral LE US  to rule out DVT - Had been getting Unna boots serially; will put Right leg Unna boot post-US  - Follows with wound care outpatient Type 2 diabetes mellitus (HCC) Last A1c 9.4. - SSI, adjust as needed - Continue Jardiance  - Holding Ozempic Chronic health problem A-fib : continue eliquis  5 mg BID, coreg  25 mg BID, Diltiazem 300 mg daily HTN: continue home losartan 25 mg daily,  imdur  120 mg daily, doxazosin  8 mg and amlodipine  10 mg daily; holding hydralazine  100 mg TID HLD: continue lipitor  80 mg every day   FEN/GI: Heart healthy PPx: Eliquis  5 mg twice daily Dispo: Home pending continued diuresis.  Subjective:  Patient seen alongside wife; patient has known expressive aphasia and wife helped relay history.  Patient reports he is feeling better today and breathing better.  Per wife's report, his bilateral lower extremity edema is improved.  No other concerns.  Objective: Temp:  [98.1 F (36.7 C)-98.4 F (36.9 C)] 98.3 F (36.8 C) (11/13 0747) Pulse Rate:  [60-86] 74 (11/13 0431) Resp:  [18-22] 18 (11/13 0747) BP: (125-141)/(74-94) 141/94 (11/13 0747) SpO2:  [97 %-100 %] 98 % (11/13 0431) Weight:  [128.3 kg-131.6 kg] 128.3 kg (11/13 0431) Physical Exam: General: Patient lying back in bed with head of bed elevated, no acute distress Cardiovascular: Regular rate and rhythm, no murmurs/rubs/gallops. Respiratory: Normal work of breathing on room air. Clear to auscultation bilaterally; no wheezes, crackles. Abdomen: Bowel sounds present and normoactive bilaterally. Soft, nondistended, nontender. Extremities: Skin warm, dry.  1+ pitting right lower extremity edema, nonpitting left lower extremity edema.  Bilateral lower extremities with old skin changes, scabbed over small wounds. Neuro: Alert and appropriately responding to questions with yes or no answers; known history of expressive aphasia, per wife not off of baseline.  Laboratory: Most recent CBC Lab Results  Component Value Date   WBC 4.6 12/18/2023   HGB 13.9 12/18/2023   HCT  41.3 12/18/2023   MCV 81.6 12/18/2023   PLT 163 12/18/2023   Most recent BMP    Latest Ref Rng & Units 12/19/2023    3:57 AM  BMP  Glucose 70 - 99 mg/dL 855   BUN 8 - 23 mg/dL 20   Creatinine 9.38 - 1.24 mg/dL 8.17   Sodium 864 - 854 mmol/L 141   Potassium 3.5 - 5.1 mmol/L 3.2   Chloride 98 - 111 mmol/L 107   CO2  22 - 32 mmol/L 23   Calcium  8.9 - 10.3 mg/dL 8.0   Mag: 1.8  CXR 88/86:  1. Bilateral small layering pleural effusions. 2. Stable cardiomegaly and central vascular prominence without overt edema.  Larraine Palma, MD 12/19/2023, 8:24 AM  PGY-1, [facility] FPTS Intern pager: [phone], text pages welcome Secure chat group [facility] Teaching Service

## 2023-12-19 NOTE — Evaluation (Signed)
 Physical Therapy Evaluation Patient Details Name: Justin Molina. MRN: [id number] DOB: [birth year] Today's Date: 12/19/2023  History of Present Illness  63 yo male adm 12/18/23 with SOB, LB edema, CHF exacerbation. PMH: admission 10/26 with Afib/RVR. CVA with Rt weakness and aphasia, HTN, T2DM, neuropathy, obesity, BPH, CKD, HLD, CHF.  Clinical Impression  Pt very pleasant and eager to get OOb and change wet gown. Pt communicating with single word responses and gestures majority of session. Pt with baseline hemiplegia and aphasia able to perform transfers with CGA-min assist and walk with hemi-walker. Pt reports being active with HHPT and wife available to assist at D/C. Pt with decreased transfers and activity tolerance who will benefit from acute therapy to maximize mobility. Pt requesting to leave feet down to eat but educated for importance of elevation at rest due to edema and for daily weights. SPO2 >94% on RA throughout. HHPT appropriate.        If plan is discharge home, recommend the following: A little help with walking and/or transfers;A little help with bathing/dressing/bathroom;Assistance with cooking/housework   Can travel by private vehicle   Yes    Equipment Recommendations None recommended by PT  Recommendations for Other Services       Functional Status Assessment Patient has had a recent decline in their functional status and/or demonstrates limited ability to make significant improvements in function in a reasonable and predictable amount of time     Precautions / Restrictions Precautions Precautions: Fall Recall of Precautions/Restrictions: Intact Precaution/Restrictions Comments: R hemiplegia, aphasia      Mobility  Bed Mobility   Bed Mobility: Supine to Sit     Supine to sit: Supervision     General bed mobility comments: HOB 30 degrees, with rail, increased time    Transfers Overall transfer level: Needs assistance   Transfers: Sit to/from  Stand, Bed to chair/wheelchair/BSC Sit to Stand: Contact guard assist, Min assist           General transfer comment: CGA to rise from bed at home height, pivot to chair with CGA with hemiwalker. min assist to rise from recliner with Lt armrest    Ambulation/Gait Ambulation/Gait assistance: Contact guard assist Gait Distance (Feet): 100 Feet Assistive device: Hemi-walker Gait Pattern/deviations: Step-to pattern, Decreased step length - right, Decreased stance time - right, Decreased dorsiflexion - right   Gait velocity interpretation: <1.8 ft/sec, indicate of risk for recurrent falls   General Gait Details: RLE circumduction and hip hike, chronic Rt foot drop, reliant on LUE support with guarding for safety and assist for lines  Stairs            Wheelchair Mobility     Tilt Bed    Modified Rankin (Stroke Patients Only)       Balance Overall balance assessment: Needs assistance Sitting-balance support: No upper extremity supported, Feet supported Sitting balance-Leahy Scale: Good     Standing balance support: Single extremity supported, No upper extremity supported, During functional activity Standing balance-Leahy Scale: Poor Standing balance comment: LUE support in standing                             Pertinent Vitals/Pain Pain Assessment Pain Assessment: No/denies pain    Home Living Family/patient expects to be discharged to:: Private residence Living Arrangements: Spouse/significant other Available Help at Discharge: Family;Available 24 hours/day Type of Home: Apartment Home Access: Level entry       Home Layout: One level Home  Equipment: Tub bench;Wheelchair - manual;BSC/3in1;Wheelchair - power;Hospital bed;Other (comment) Additional Comments: hemiwalker at baseline, PLOF and home setup from prior admission as pt unable to provide and spouse not present    Prior Function Prior Level of Function : Independent/Modified Independent              Mobility Comments: ModI using hemiwalker, able to manage standing balance without UE support. no falls ADLs Comments: independent with bathing, dressing, Sup for safety from spouse for bathing, pt does laundry     Extremity/Trunk Assessment   Upper Extremity Assessment RUE Deficits / Details: chronic Rt hemiplegia without AROM    Lower Extremity Assessment RLE Deficits / Details: no active DF (pt typically with AFO but does not currently have), grossly 2-/5 for knee flexion and hip flexion    Cervical / Trunk Assessment Cervical / Trunk Assessment: Kyphotic  Communication   Communication Communication: Impaired Factors Affecting Communication: Difficulty expressing self    Cognition Arousal: Alert Behavior During Therapy: WFL for tasks assessed/performed   PT - Cognitive impairments: No apparent impairments, Difficult to assess Difficult to assess due to: Impaired communication                     PT - Cognition Comments: pt with hx of aphasia, able to answer some simple questions, responding approrpriately to cues and commands Following commands: Intact Following commands impaired: Only follows one step commands consistently     Cueing Cueing Techniques: Verbal cues     General Comments      Exercises     Assessment/Plan    PT Assessment Patient needs continued PT services  PT Problem List Decreased strength;Decreased activity tolerance;Decreased balance;Decreased mobility;Decreased coordination;Obesity       PT Treatment Interventions DME instruction;Gait training;Stair training;Functional mobility training;Therapeutic activities;Therapeutic exercise;Balance training;Patient/family education    PT Goals (Current goals can be found in the Care Plan section)  Acute Rehab PT Goals Patient Stated Goal: pt agreeable to return home PT Goal Formulation: With patient Time For Goal Achievement: 01/02/24 Potential to Achieve Goals: Good     Frequency Min 1X/week     Co-evaluation               AM-PAC PT 6 Clicks Mobility  Outcome Measure Help needed turning from your back to your side while in a flat bed without using bedrails?: A Little Help needed moving from lying on your back to sitting on the side of a flat bed without using bedrails?: A Little Help needed moving to and from a bed to a chair (including a wheelchair)?: A Little Help needed standing up from a chair using your arms (e.g., wheelchair or bedside chair)?: A Little Help needed to walk in hospital room?: A Little Help needed climbing 3-5 steps with a railing? : A Lot 6 Click Score: 17    End of Session Equipment Utilized During Treatment: Gait belt Activity Tolerance: Patient tolerated treatment well Patient left: in chair;with call bell/phone within reach;with chair alarm set;with nursing/sitter in room Nurse Communication: Mobility status PT Visit Diagnosis: Unsteadiness on feet (R26.81);Other abnormalities of gait and mobility (R26.89);Muscle weakness (generalized) (M62.81);Difficulty in walking, not elsewhere classified (R26.2);Hemiplegia and hemiparesis Hemiplegia - Right/Left: Right    Time: 8760-8696 PT Time Calculation (min) (ACUTE ONLY): 24 min   Charges:   PT Evaluation $PT Eval Moderate Complexity: 1 Mod PT Treatments $Therapeutic Activity: 8-22 mins PT General Charges $$ ACUTE PT VISIT: 1 Visit  Lenoard SQUIBB, PT Acute Rehabilitation Services Office: [phone]   Lenoard KATHEE Docker 12/19/2023, 1:34 PM

## 2023-12-19 NOTE — Progress Notes (Signed)
 Orthopedic Tech Progress Note Patient Details:  Drelyn Pistilli. 08/08/1960 [id number]  Ortho Devices Type of Ortho Device: Nonie boot Ortho Device/Splint Location: RLE Ortho Device/Splint Interventions: Ordered, Application, Adjustment   Post Interventions Patient Tolerated: Fair Instructions Provided: Adjustment of device, Care of device  Latrelle Bazar F Karriem Muench 12/19/2023, 5:08 PM

## 2023-12-19 NOTE — Progress Notes (Signed)
 Heart Failure Navigator Progress Note  Assessed for Heart & Vascular TOC clinic readiness.  Patient does not meet criteria due to patient has a scheduled [facility] appointment on 12/23/2023. No HF TOC. .   Navigator will sign off at this time.   Stephane Haddock, BSN, Scientist, Clinical (histocompatibility And Immunogenetics) Only

## 2023-12-19 NOTE — Consult Note (Addendum)
 WOC Nurse Consult Note: Reason for Consult: Request unna boot on right leg after DVT ultrasound. The exam was being performed when I visited his room. Pressure Injury POA: NA No open wounds, multiple discolor scars in both legs. Periwound: edema bilateral legs, dorsal pulses presents. Dressing procedure/placement/frequency: Cleanse the skin with warm water and wash cloth, pat dry. Apply a moisturizing barrier cream before Unna boot. Change Unna boot twice a week per ortho tech.  NURSING, please call Ortho Tech with new orders.  [facility] Nursing, call Ortho Tech at [phone]   [facility] team will not plan to follow further. Please reconsult if further assistance is needed. Thank-you,  Lela Holm MSN, RN, CNS.  (Phone [phone])

## 2023-12-19 NOTE — Assessment & Plan Note (Addendum)
 Right leg pain and edema R>L. Pt on Eliquis  5 mg BID at home  - Pending Bilateral LE US  to rule out DVT - Had been getting Unna boots serially; will put Right leg Unna boot post-US  - Follows with wound care outpatient

## 2023-12-19 NOTE — Evaluation (Signed)
 Occupational Therapy Evaluation Patient Details Name: Justin Molina. MRN: [id number] DOB: [birth year] Today's Date: 12/19/2023   History of Present Illness   63 yo male adm 12/18/23 with SOB, LB edema, CHF exacerbation. PMH: admission 10/26 with Afib/RVR. CVA with Rt weakness and aphasia, HTN, T2DM, neuropathy, obesity, BPH, CKD, HLD, CHF.     Clinical Impressions Pt presents with decline in function and safety with ADLs and ADL mobility with impaired strength, balance and endurance; pt with hx of CVA with rc hemiplegia and aphasia. PTA pt lives with his wife, was Ind/Mod I with ADLs, Sup for showers and used hemiwalker for mobility. Pt currently required mod A with UB and LB ADLs, mod  A with toileting, min A/CGA STS and mobility using hemiwalker. Pt with recent stay at this hospital with d/c to SNF for rehab although pt reports that he was recently at home working with [facility] therapies. OT will follow acutely to maximize level of function and safety     If plan is discharge home, recommend the following:   A lot of help with bathing/dressing/bathroom;A lot of help with walking and/or transfers;Assist for transportation;Help with stairs or ramp for entrance;Direct supervision/assist for medications management     Functional Status Assessment   Patient has had a recent decline in their functional status and demonstrates the ability to make significant improvements in function in a reasonable and predictable amount of time.     Equipment Recommendations   None recommended by OT     Recommendations for Other Services         Precautions/Restrictions   Precautions Precautions: Fall Recall of Precautions/Restrictions: Intact Precaution/Restrictions Comments: R hemiplegia, aphasia Restrictions Weight Bearing Restrictions Per Provider Order: No     Mobility Bed Mobility               General bed mobility comments: pt in chair    Transfers Overall transfer  level: Needs assistance Equipment used: Hemi-walker Transfers: Sit to/from Stand, Bed to chair/wheelchair/BSC Sit to Stand: Min assist, Contact guard assist     Step pivot transfers: Contact guard assist            Balance Overall balance assessment: Needs assistance Sitting-balance support: No upper extremity supported, Feet supported Sitting balance-Leahy Scale: Good     Standing balance support: Single extremity supported, No upper extremity supported, During functional activity Standing balance-Leahy Scale: Poor                             ADL either performed or assessed with clinical judgement   ADL Overall ADL's : Needs assistance/impaired Eating/Feeding: Set up;Independent;Sitting   Grooming: Wash/dry hands;Wash/dry face;Contact guard assist;Sitting   Upper Body Bathing: Moderate assistance   Lower Body Bathing: Moderate assistance   Upper Body Dressing : Moderate assistance   Lower Body Dressing: Moderate assistance   Toilet Transfer: Minimal assistance;Contact guard assist;BSC/3in1;Cueing for safety Toilet Transfer Details (indicate cue type and reason): hemiwalker Toileting- Clothing Manipulation and Hygiene: Moderate assistance       Functional mobility during ADLs: Minimal assistance;Contact guard assist;Cueing for safety (hemiwalker)       Vision Baseline Vision/History: 1 Wears glasses Ability to See in Adequate Light: 0 Adequate Patient Visual Report: No change from baseline       Perception         Praxis         Pertinent Vitals/Pain Pain Assessment Pain Assessment: No/denies pain  Extremity/Trunk Assessment Upper Extremity Assessment Upper Extremity Assessment: Right hand dominant;Left hand dominant;RUE deficits/detail RUE Deficits / Details: chronic R hemiplegia, no AROM, flexed elbow and digits. Pt reports that he has a slonint at home for R UE   Lower Extremity Assessment Lower Extremity Assessment: Defer to  PT evaluation RLE Deficits / Details: no active DF (pt typically with AFO but does not currently have), grossly 2-/5 for knee flexion and hip flexion   Cervical / Trunk Assessment Cervical / Trunk Assessment: Kyphotic   Communication Communication Communication: Impaired Factors Affecting Communication: Difficulty expressing self   Cognition Arousal: Alert Behavior During Therapy: WFL for tasks assessed/performed                                 Following commands: Intact       Cueing  General Comments   Cueing Techniques: Verbal cues      Exercises     Shoulder Instructions      Home Living Family/patient expects to be discharged to:: Private residence Living Arrangements: Spouse/significant other Available Help at Discharge: Family;Available 24 hours/day Type of Home: Apartment Home Access: Level entry     Home Layout: One level     Bathroom Shower/Tub: Chief Strategy Officer: Handicapped height     Home Equipment: Tub bench;Wheelchair - manual;BSC/3in1;Wheelchair - power;Hospital bed;Other (comment)   Additional Comments: uses hemiwalker at baseline, PLOF and home setup from prior admission as pt unable to provide and spouse not present      Prior Functioning/Environment Prior Level of Function : Independent/Modified Independent             Mobility Comments: Mod I using hemiwalker ADLs Comments: Ind with ADLs, sup with showers, pt does laundry    OT Problem List: Decreased strength;Decreased range of motion;Decreased coordination;Impaired tone;Decreased activity tolerance;Impaired balance (sitting and/or standing);Impaired sensation;Impaired UE functional use   OT Treatment/Interventions:        OT Goals(Current goals can be found in the care plan section)   Acute Rehab OT Goals Patient Stated Goal: none stated OT Goal Formulation: With patient Time For Goal Achievement: 01/02/24 Potential to Achieve Goals:  Good ADL Goals Pt Will Perform Grooming: with supervision;with set-up;sitting Pt Will Perform Upper Body Bathing: with min assist;with contact guard assist;sitting Pt Will Perform Lower Body Bathing: with min assist;with caregiver independent in assisting Pt Will Perform Upper Body Dressing: with min assist;with contact guard assist;sitting Pt Will Perform Lower Body Dressing: with min assist;with caregiver independent in assisting Pt Will Transfer to Toilet: with contact guard assist;with supervision;ambulating Pt Will Perform Toileting - Clothing Manipulation and hygiene: with min assist;with contact guard assist;sitting/lateral leans;sit to/from stand;with caregiver independent in assisting   OT Frequency:  Min 2X/week    Co-evaluation              AM-PAC OT 6 Clicks Daily Activity     Outcome Measure Help from another person eating meals?: A Little Help from another person taking care of personal grooming?: A Little Help from another person toileting, which includes using toliet, bedpan, or urinal?: A Lot Help from another person bathing (including washing, rinsing, drying)?: A Lot Help from another person to put on and taking off regular upper body clothing?: A Lot Help from another person to put on and taking off regular lower body clothing?: A Lot 6 Click Score: 14   End of Session Equipment Utilized During Treatment: Gait  belt;Other (comment) psychologist, educational) Nurse Communication: Mobility status  Activity Tolerance:   Patient left: in chair;with call bell/phone within reach;with chair alarm set  OT Visit Diagnosis: Unsteadiness on feet (R26.81);Other abnormalities of gait and mobility (R26.89);Muscle weakness (generalized) (M62.81);Hemiplegia and hemiparesis Hemiplegia - Right/Left: Right                Time: 8668-8644 OT Time Calculation (min): 24 min Charges:  OT General Charges $OT Visit: 1 Visit OT Evaluation $OT Eval Moderate Complexity: 1 Mod OT  Treatments $Therapeutic Activity: 8-22 mins    Jacques Karna Loose 12/19/2023, 2:12 PM

## 2023-12-19 NOTE — Assessment & Plan Note (Addendum)
 Last A1c 9.4. - SSI, adjust as needed - Continue Jardiance  - Holding Ozempic

## 2023-12-19 NOTE — Progress Notes (Signed)
 PT Cancellation Note  Patient Details Name: Justin Molina. MRN: [id number] DOB: December 05, 1960   Cancelled Treatment:    Reason Eval/Treat Not Completed: Medical issues which prohibited therapy (await doppler to rule out DVT)   Justin Molina 12/19/2023, 7:08 AM Lenoard SQUIBB, PT Acute Rehabilitation Services Office: [phone]

## 2023-12-20 ENCOUNTER — Other Ambulatory Visit (HOSPITAL_COMMUNITY): Payer: Self-pay

## 2023-12-20 DIAGNOSIS — I5033 Acute on chronic diastolic (congestive) heart failure: Secondary | ICD-10-CM | POA: Diagnosis not present

## 2023-12-20 DIAGNOSIS — I455 Other specified heart block: Secondary | ICD-10-CM | POA: Insufficient documentation

## 2023-12-20 LAB — GLUCOSE, CAPILLARY
Glucose-Capillary: 141 mg/dL — ABNORMAL HIGH (ref 70–99)
Glucose-Capillary: 142 mg/dL — ABNORMAL HIGH (ref 70–99)
Glucose-Capillary: 152 mg/dL — ABNORMAL HIGH (ref 70–99)

## 2023-12-20 LAB — BASIC METABOLIC PANEL WITH GFR
Anion gap: 9 (ref 5–15)
BUN: 23 mg/dL (ref 8–23)
CO2: 27 mmol/L (ref 22–32)
Calcium: 8.1 mg/dL — ABNORMAL LOW (ref 8.9–10.3)
Chloride: 104 mmol/L (ref 98–111)
Creatinine, Ser: 2.04 mg/dL — ABNORMAL HIGH (ref 0.61–1.24)
GFR, Estimated: 36 mL/min — ABNORMAL LOW (ref >60)
Glucose, Bld: 119 mg/dL — ABNORMAL HIGH (ref 70–99)
Potassium: 3.6 mmol/L (ref 3.5–5.1)
Sodium: 140 mmol/L (ref 135–145)

## 2023-12-20 LAB — MAGNESIUM: Magnesium: 2.3 mg/dL (ref 1.7–2.4)

## 2023-12-20 MED ORDER — POTASSIUM CHLORIDE CRYS ER 20 MEQ PO TBCR
40.0000 meq | EXTENDED_RELEASE_TABLET | Freq: Once | ORAL | Status: AC
  Administered 2023-12-20: 40 meq via ORAL
  Filled 2023-12-20: qty 2

## 2023-12-20 MED ORDER — CARVEDILOL 12.5 MG PO TABS
12.5000 mg | ORAL_TABLET | Freq: Two times a day (BID) | ORAL | 0 refills | Status: DC
  Filled 2023-12-20: qty 60, 30d supply, fill #0

## 2023-12-20 MED ORDER — TORSEMIDE 20 MG PO TABS
40.0000 mg | ORAL_TABLET | Freq: Every day | ORAL | 0 refills | Status: AC
  Filled 2023-12-20: qty 60, 30d supply, fill #0

## 2023-12-20 MED ORDER — CARVEDILOL 12.5 MG PO TABS: 12.5000 mg | ORAL_TABLET | Freq: Two times a day (BID) | ORAL | Status: DC

## 2023-12-20 MED ORDER — POLYVINYL ALCOHOL 1.4 % OP SOLN
2.0000 [drp] | OPHTHALMIC | Status: DC | PRN
  Administered 2023-12-20: 2 [drp] via OPHTHALMIC
  Filled 2023-12-20 (×2): qty 15

## 2023-12-20 MED ORDER — TORSEMIDE 20 MG PO TABS
40.0000 mg | ORAL_TABLET | Freq: Every day | ORAL | Status: DC
  Administered 2023-12-20: 40 mg via ORAL
  Filled 2023-12-20: qty 2

## 2023-12-20 NOTE — Progress Notes (Signed)
 DISCHARGE NOTE HOME Justin Molina. to be discharged Home per MD order. Discussed prescriptions and follow up appointments with the patient. Prescriptions given to patient; medication list explained in detail. Patient verbalized understanding.  Skin clean, dry and intact without evidence of skin break down, no evidence of skin tears noted. IV catheter discontinued intact. Site without signs and symptoms of complications. Dressing and pressure applied. Pt denies pain at the site currently. No complaints noted.  Patient free of lines, drains, and wounds.   An After Visit Summary (AVS) was printed and given to the patient. Patient escorted via wheelchair, and discharged home via private auto.  Peyton SHAUNNA Pepper, RN

## 2023-12-20 NOTE — Progress Notes (Signed)
 Patient just had a 2.29 sec pause and when I look back it, it appears that he had a 2.18 sec pause last night at 2330. Suknaim DO notified. EKG and vitals documented. See new orders.

## 2023-12-20 NOTE — Assessment & Plan Note (Addendum)
 Out 2.05L 11/13. Dry weight potentially ~126.2kg (weight at time of discharge from last hosp); 129 kg today. - Transition to po torsemide  40 mg today 11/14 - Pain control: Tylenol  650 mg Q6H PRN  - Continue home GDMT: Carvedilol  25 mg BID, Losartan 25 mg daily, Jardiance  10 mg daily, Imdur  120 mg daily - Reducing carvedilol  as below - AM Labs: BMP, Mag - Repleting electrolytes as indicated; K repleted with 40 mEq once today - Goal K>4, Mag>2 - Daily weights and strict I/O - Fall precautions - Delirium precautions - PT/OT consult  - Patient needs PCP; TOC consulted to assist with this

## 2023-12-20 NOTE — Assessment & Plan Note (Signed)
 Last A1c 9.4. - SSI, adjust as needed - Continue Jardiance  - Holding Ozempic

## 2023-12-20 NOTE — Assessment & Plan Note (Addendum)
 A-fib: Continue eliquis  5 mg BID, coreg  25 mg BID, Diltiazem 300 mg daily -> decrease carvedilol  to 12.5 mg BID HTN: Continue home losartan 25 mg daily, imdur  120 mg daily, and amlodipine  10 mg daily; holding hydralazine  100 mg TID, doxazosin  4 mg HLD: Continue lipitor  80 mg every day

## 2023-12-20 NOTE — Plan of Care (Signed)
  Problem: Education: Goal: Ability to describe self-care measures that may prevent or decrease complications (Diabetes Survival Skills Education) will improve Outcome: Progressing Goal: Individualized Educational Video(s) Outcome: Progressing   Problem: Coping: Goal: Ability to adjust to condition or change in health will improve Outcome: Progressing   Problem: Metabolic: Goal: Ability to maintain appropriate glucose levels will improve Outcome: Progressing   Problem: Education: Goal: Knowledge of General Education information will improve Description: Including pain rating scale, medication(s)/side effects and non-pharmacologic comfort measures Outcome: Progressing   Problem: Tissue Perfusion: Goal: Adequacy of tissue perfusion will improve Outcome: Progressing

## 2023-12-20 NOTE — Assessment & Plan Note (Addendum)
 2 second pause overnight 11/13-11/14; prior 2 sec pause overnight 11/12-11/13. Asymptomatic. Does have a history of OSA, not on CPAP at home, suspect possible contribution from this. - Ordered CPAP for patient starting tonight 11/14-11/15 if patient still here - Decrease carvedilol  to 12.5 mg BID

## 2023-12-20 NOTE — Assessment & Plan Note (Signed)
 Pt on Eliquis  5 mg BID at home; negative for DVT this hospitalization. Getting serial Unna boots outpatient, continuing inpatient. - Right leg Unna boot placed 11/13 - Follows with wound care outpatient

## 2023-12-20 NOTE — Discharge Summary (Addendum)
 Family Medicine Teaching [facility] Discharge Summary  Patient name: Justin Molina. Medical record number: [id number] Date of birth: Aug 12, 1960 Age: 63 y.o. Gender: male Date of Admission: 12/18/2023  Date of Discharge: 12/20/23 Admitting Physician: [place] KATHEE Samuels, DO  Primary Care Provider: [facility] Consultants: None  Indication for Hospitalization: trouble breathing  Discharge Diagnoses/Problem List:  Principal Problem for Admission: Acute Heart Failure Exacerbation Other Problems addressed during stay:  Active Problems:   Acute on chronic diastolic heart failure (HCC)   A fib   Ulcer of extremity due to chronic venous insufficiency (HCC)   Type 2 diabetes mellitus (HCC)   Bilateral pleural effusion   Sinus pause   Brief Hospital Course:  Justin Molina. is a 63 y.o.male with a history of T2DM, HTN, CKD, HLD, aphasia, hemiplegia and hemiparesis affecting the right side, OSA, A-fib, CHF (EF 50-55%), BPH, CAD who was [facility] to [facility] at [facility] for CHF exacerbation. His hospital course is detailed below:  Acute exacerbation of CHF Patient was recently D/C from our service 11/2023 for A fib RVR and HHS. At discharge, he was instructed to hold his Torsemide  until he could follow-up with cardiology.  Patient unable to follow-up.  Prior to presentation, was having orthopnea and worsening lower extremity edema; reported nearly 20 pound weight gain in prior 2 days.  At admission, also had O2 requirement and dyspnea.  Was diuresed with IV Lasix  and weaned to room air.  Continued to receive diuresis with improvement in symptoms; transitioned to p.o. diuresis with torsemide  40 mg daily on 11/14 and discharged on same.  Continued on home GDMT (Losartan 25 mg daily, Jardiance  10 mg daily, Imdur  120 mg daily); carvedilol  was deceased from 25 mg BID to 12.5 mg BID given a few brief sinus pauses seen overnight. Cards followup 11/17   Right lower  extremity pain Nonhealing leg wounds Had right leg pain and R>L edema on admission; had been compliant with Eliquis  at home, but given findings concern for potential DVT.  Bilateral lower extremity ultrasound was done 11/13, which showed no evidence of DVT bilaterally.  He had been receiving Unna boot to the right leg outpatient, which was resumed inpatient.  Other chronic conditions were medically managed with home medications and formulary alternatives as necessary (T2DM, A-fib, HTN, HLD)  PCP Follow-up Recommendations: Ensure follow-up with wound care for nonhealing leg wounds Consider outpatient sleep study Consider referral to Endocrinology (wife asking about this inpatient) Placed HHPT order at discharge; also placed [facility] order at wife's request for help with medication management (unsure if this will be approved by insurance, wife was asking about assistance with care in the home)    Results/Tests Pending at Time of Discharge:  Unresulted Labs (From admission, onward)     Start     Ordered   12/21/23 0500  Magnesium  Tomorrow morning,   R       Question:  Specimen collection method  Answer:  Lab=Lab collect   12/20/23 1137   12/21/23 0500  Basic metabolic panel  Tomorrow morning,   R       Question:  Specimen collection method  Answer:  Lab=Lab collect   12/20/23 1137             Disposition: Home with HHPT (HHRN order also placed for medication management)  Discharge Condition: Stable  Discharge Exam:  Vitals:   12/20/23 1200 12/20/23 1215  BP: 128/80   Pulse:  72  Resp: ROLLEN)  26 20  Temp: 98.4 F (36.9 C)   SpO2:  96%   See earlier progress note by Alan Flies for physical exam.  Significant Procedures: None  Significant Labs and Imaging:  Recent Labs  Lab 12/18/23 1639  WBC 4.6  HGB 13.9  HCT 41.3  PLT 163   Recent Labs  Lab 12/18/23 1639 12/19/23 0357 12/19/23 1758 12/20/23 0344  NA 142 141 141 140  K 3.8 3.2* 3.7 3.6  CL 105 107 106 104   CO2 25 23 26 27   GLUCOSE 99 144* 99 119*  BUN 22 20 21 23   CREATININE 1.92* 1.82* 1.94* 2.04*  CALCIUM  8.1* 8.0* 8.0* 8.1*  MG  --  1.8  --  2.3  ALKPHOS 77  --   --   --   AST 15  --   --   --   ALT 10  --   --   --   ALBUMIN 3.1*  --   --   --     CXR 11/13:  1. Bilateral small layering pleural effusions. 2. Stable cardiomegaly and central vascular prominence without overt edema.   Discharge Medications:  Allergies as of 12/20/2023       Reactions   Contrast Media [iodinated Contrast Media] Shortness Of Breath, Nausea And Vomiting, Swelling, Other (See Comments)   Facial swelling and felt flushed, too   Iodine Shortness Of Breath, Nausea And Vomiting, Swelling, Other (See Comments)   Facial swelling and felt flushed, too        Medication List     PAUSE taking these medications    hydrALAZINE  50 MG tablet Wait to take this until your doctor or other care provider tells you to start again. Commonly known as: APRESOLINE  Take 1 tablet (50 mg total) by mouth every 6 (six) hours.       STOP taking these medications    insulin  aspart 100 UNIT/ML injection Commonly known as: novoLOG    Lantus SoloStar 100 UNIT/ML Solostar Pen Generic drug: insulin  glargine       TAKE these medications    acetaminophen  500 MG tablet Commonly known as: TYLENOL  Take 1 tablet (500 mg total) by mouth every 6 (six) hours as needed. What changed:  how much to take when to take this reasons to take this   albuterol  108 (90 Base) MCG/ACT inhaler Commonly known as: VENTOLIN  HFA Inhale 2 puffs into the lungs every 4 (four) hours as needed for wheezing or shortness of breath.   amLODipine  10 MG tablet Commonly known as: NORVASC  Take 10 mg by mouth daily.   atorvastatin  80 MG tablet Commonly known as: LIPITOR  Take 1 tablet (80 mg total) by mouth at bedtime.   carvedilol  12.5 MG tablet Commonly known as: COREG  Take 1 tablet (12.5 mg total) by mouth 2 (two) times daily with a  meal. What changed:  medication strength how much to take additional instructions   diltiazem 300 MG 24 hr capsule Commonly known as: CARDIZEM CD Take 1 capsule (300 mg total) by mouth daily.   doxazosin  4 MG tablet Commonly known as: CARDURA  Take 4 mg by mouth at bedtime. What changed: Another medication with the same name was removed. Continue taking this medication, and follow the directions you see here.   Eliquis  5 MG Tabs tablet Generic drug: apixaban  Take 1 tablet (5 mg total) by mouth 2 (two) times daily.   fluticasone 50 MCG/ACT nasal spray Commonly known as: FLONASE Place 2 sprays into both nostrils  daily as needed for allergies or rhinitis.   glucose blood test strip Commonly known as: Accu-Chek Aviva Plus Use as instructed for TID and QHS blood glucose testing   isosorbide  mononitrate 120 MG 24 hr tablet Commonly known as: IMDUR  Take 1 tablet (120 mg total) by mouth daily.   Jardiance  10 MG Tabs tablet Generic drug: empagliflozin  Take 1 tablet (10 mg total) by mouth daily.   Lancets 28G Misc Check blood sugar TID & QHS   loratadine  10 MG tablet Commonly known as: CLARITIN  Take 10 mg by mouth daily as needed for allergies or rhinitis.   losartan 25 MG tablet Commonly known as: COZAAR Take 25 mg by mouth daily.   nystatin cream Commonly known as: MYCOSTATIN Apply 1 Application topically daily as needed (yeast).   Ozempic (0.25 or 0.5 MG/DOSE) 2 MG/3ML Sopn Generic drug: Semaglutide(0.25 or 0.5MG /DOS) Inject 0.25 mg into the skin every Sunday.   potassium chloride  SA 20 MEQ tablet Commonly known as: KLOR-CON  M Take 1 tablet (20 mEq total) by mouth daily.   torsemide  20 MG tablet Commonly known as: DEMADEX  Take 2 tablets (40 mg total) by mouth daily. Can take an additional tablet prn shortness of breath        Discharge Instructions: Please refer to Patient Instructions section of EMR for full details.  Patient was counseled important signs  and symptoms that should prompt return to medical care, changes in medications, dietary instructions, activity restrictions, and follow up appointments.   Follow-Up Appointments:  Future Appointments  Date Time Provider Department Center  12/23/2023  3:20 PM Kate Lonni CROME, MD [facility]  01/22/2024  1:20 PM Sebastian Beverley NOVAK, MD [facility] [place] Larraine Palma, MD 12/20/2023, 2:24 PM PGY-1, [facility] Family Medicine   I have reviewed the above note, agree with its content, and have made the appropriate changes.   Damien Pinal, DO [facility], PGY-3

## 2023-12-20 NOTE — Progress Notes (Addendum)
 Daily Progress Note Intern Pager: [phone]  Patient name: Justin Molina. Medical record number: [id number] Date of birth: October 07, 1960 Age: 63 y.o. Gender: male  Primary Care Provider: [facility] Consultants: None Code Status: Full  Pt Overview and Major Events to Date:  - 11/12: Admitted  Medical Decision Making:  Charlie Catha Raddle. is a 63 y.o. male with PMH T2DM, HTN, CKD, HLD, expressive aphasia, hemiplegia and hemiparesis affecting the right side, OSA, A-fib, CHF (EF 50-55%), BPH, CAD. Admitted with dyspnea and hypoxia 2/2 CHF exacerbation receiving diuresis. Assessment & Plan Acute on chronic diastolic heart failure (HCC) Bilateral pleural effusion Out 2.05L 11/13. Dry weight potentially ~126.2kg (weight at time of discharge from last hosp); 129 kg today. - Transition to po torsemide  40 mg today 11/14 - Pain control: Tylenol  650 mg Q6H PRN  - Continue home GDMT: Carvedilol  25 mg BID, Losartan 25 mg daily, Jardiance  10 mg daily, Imdur  120 mg daily - Reducing carvedilol  as below - AM Labs: BMP, Mag - Repleting electrolytes as indicated; K repleted with 40 mEq once today - Goal K>4, Mag>2 - Daily weights and strict I/O - Fall precautions - Delirium precautions - PT/OT consult  - Patient needs PCP; TOC consulted to assist with this Ulcer of extremity due to chronic venous insufficiency (HCC) Pt on Eliquis  5 mg BID at home; negative for DVT this hospitalization. Getting serial Unna boots outpatient, continuing inpatient. - Right leg Unna boot placed 11/13 - Follows with wound care outpatient Type 2 diabetes mellitus (HCC) Last A1c 9.4. - SSI, adjust as needed - Continue Jardiance  - Holding Ozempic Sinus pause 2 second pause overnight 11/13-11/14; prior 2 sec pause overnight 11/12-11/13. Asymptomatic. Does have a history of OSA, not on CPAP at home, suspect possible contribution from this. - Ordered CPAP for patient starting tonight 11/14-11/15 if  patient still here - Decrease carvedilol  to 12.5 mg BID Chronic health problem A-fib: Continue eliquis  5 mg BID, coreg  25 mg BID, Diltiazem 300 mg daily -> decrease carvedilol  to 12.5 mg BID HTN: Continue home losartan 25 mg daily, imdur  120 mg daily, and amlodipine  10 mg daily; holding hydralazine  100 mg TID, doxazosin  4 mg HLD: Continue lipitor  80 mg every day  FEN/GI: Heart healthy PPx: Eliquis  5 mg BID Dispo: Home with home health today pending wife able to bring him back today for safe dispo.  Subjective:  Reports he is doing well overall.  His shortness of breath and edema have both improved and he has been able to walk without issue.  No other concerns.  Objective: Temp:  [97.8 F (36.6 C)-98.3 F (36.8 C)] 98 F (36.7 C) (11/14 0525) Pulse Rate:  [61-90] 90 (11/14 0525) Resp:  [16-20] 16 (11/14 0525) BP: (117-142)/(74-94) 142/87 (11/14 0525) SpO2:  [97 %-100 %] 100 % (11/14 0027) Weight:  [129 kg] 129 kg (11/14 0525) Physical Exam: General: Sitting up in bed watching TV and eating breakfast, no acute distress. Cardiovascular: Irregularly irregular, no tachycardia, no murmurs/rubs/gallops. Respiratory: Normal work of breathing on room air. Clear to auscultation bilaterally; no wheezes, crackles. Abdomen: Bowel sounds present and normoactive bilaterally. Soft, nondistended, nontender. Extremities: Right lower extremity in Unna boot; left lower extremity with nonpitting edema and overlying chronic skin changes  Laboratory: Most recent CBC Lab Results  Component Value Date   WBC 4.6 12/18/2023   HGB 13.9 12/18/2023   HCT 41.3 12/18/2023   MCV 81.6 12/18/2023   PLT 163 12/18/2023   Most recent BMP  Latest Ref Rng & Units 12/20/2023    3:44 AM  BMP  Glucose 70 - 99 mg/dL 880   BUN 8 - 23 mg/dL 23   Creatinine 9.38 - 1.24 mg/dL 7.95   Sodium 864 - 854 mmol/L 140   Potassium 3.5 - 5.1 mmol/L 3.6   Chloride 98 - 111 mmol/L 104   CO2 22 - 32 mmol/L 27   Calcium   8.9 - 10.3 mg/dL 8.1   Mag 2.3   Larraine Palma, MD 12/20/2023, 7:38 AM  PGY-1, [employer] Family Medicine FPTS Intern pager: [phone], text pages welcome Secure chat group [facility] Teaching Service

## 2023-12-20 NOTE — Plan of Care (Signed)
 Called patient's wife to discuss changes in patient's medication plan and plan for upcoming discharge today.  Wife is agreeable to discharge this afternoon. Also informed wife that an appointment with [facility] has been made for patient's PCP needs; wife planning to speak with RN case manager about PCP needs, but wife is interested in potentially keeping this appointment.  All questions answered.

## 2023-12-20 NOTE — TOC CM/SW Note (Signed)
 Transition of Care (TOC) CM/SW Note    CSW attached [facility] resources to patients AVS.

## 2023-12-20 NOTE — Discharge Instructions (Addendum)
 Dear Justin Molina.,  Thank you for letting us  participate in your care. You were hospitalized for trouble breathing and diagnosed with heart failure exacerbation. You were treated with diuretic medications that make you pee a lot.   MEDICATION CHANGES - Take your torsemide  one pill (40mg ) every day! - We reduced the dose of your carvedilol  (Coreg ) to 12.5 mg twice a day - We are stopping your insulin  at discharge - Other medication changes per your discharge medication regimen  POST-HOSPITAL & CARE INSTRUCTIONS Follow up with your cardiologist as below Follow up with your Primary Care Physician as scheduled Go to your follow up appointments (listed below)   DOCTOR'S APPOINTMENT   Future Appointments  Date Time Provider Department Center  12/23/2023  3:20 PM Kate Lonni CROME, MD [facility]  01/22/2024  1:20 PM Sebastian Beverley NOVAK, MD [facility] [place]     Take care and be well!  Family Medicine Teaching Service Inpatient Team [place]  [facility]  [address] [place], [place] [address] [phone]    Low Income Energy Assistance Program Services One-time payment for heating bills eligible low-income households. Contact Information Contact for this department will be through your local Department of Social Services (DSS) Location Statewide program in [place]  Eligibility Must be low income, meet the household size requirement and only for heating source Hours of Operation Application period; December-March Cost/Fees-None  Referral-Please contact your local DSS for application  [employer] Enedelia Claudette Cleophus Oneta [place], Food Pantry, [employer], and [employer] Information-Phone: [phone] Location-[address], [place], [place] [address] Eligibility-Varies by program; contact directly for details Hours of Operation-Monday-Friday 8am-5pm  Cost/Fees-None Referral-Appointments  are recommended for financial assistance; walk-in accepted for food services  [employer] Services-Emergency Shelter, Food Pantry, [employer], and Museum/gallery Curator Information-Phone: [phone] Location-[address] [place], [place] [address] Eligibility-Must be a [place] resident Hours of Operation-Monday-Friday 8am-4:40pm Cost/Fees-None  Referral-Call to make an appointment on Wednesdays from 7:30am until spots are filled. Resource Guide  [facility] Doctor, Transport Planner, Homeless Shelter, and [employer] Information-Phone: [phone] Email: [email or web address] .[email or web address] Website: [email or web address].salvationarmy.org Location-[address], [place], [place] [address] Eligibility-Varies by service Hours of Operation-Monday- Friday 9am-5pm  Cost/Fees-None Referral-Call your local Salvation army to allied waste industries; walk-ins are welcomed  [facility] [place] [facility] Pantry, Financial Assistance, and Corporate Investment Banker Information-Phone: (478)725-2718 Email: helpinghandshighpoint@gmail .com Website: www.helpinghandshighpoint.org [address] [place], [place] [address] Eligibility-Must meet the income requirement and have proof of residency Hours of Operation-Monday- Friday 9am-12pm  Cost/Fees-None Referral-Walk-Ins Available Finding Help Basic Needs Resource Guide Services Database for Housing, Arts Administrator, Surveyor, Quantity, and Physicist, Medical Information-Phone: 501-428-8643 Website: www.http://harris-peterson.info/ Location-No office location Eligibility-Varies by program  Hours of Operation-27/7 online access  Cost/Fees-None Referral-No Referral Needed

## 2023-12-20 NOTE — TOC Initial Note (Signed)
 Transition of Care (TOC) - Initial/Assessment Note    Patient Details  Name: Justin Molina. MRN: [id number] Date of Birth: 1960-02-20  Transition of Care [facility]) CM/SW Contact:    Sudie Erminio Deems, RN Phone Number: 12/20/2023, 4:24 PM  Clinical Narrative: Patient presented for  shortness of breath. PTA patient was from home with spouse. Patient has DME hospital bed, hemi walker, shower bench, and toilet riser. Patient is currently active with [facility] for PT/OT- added RN and Aide. Office to call the spouse for visit times. Spouse has confirmed that the PCP will be family medicine and appointment has been arranged in EPIC. No further needs identified at this time. Spouse to transport home via private vehicle.                Expected Discharge Plan: Home w Home Health Services Barriers to Discharge: No Barriers Identified   Patient Goals and CMS Choice Patient states their goals for this hospitalization and ongoing recovery are:: patient plans to transition home   Choice offered to / list presented to : Spouse      Expected Discharge Plan and Services   Discharge Planning Services: CM Consult Post Acute Care Choice: Home Health Living arrangements for the past 2 months: Apartment Expected Discharge Date: 12/20/23                         HH Arranged: RN, Disease Management, PT, OT, Nurse's Aide HH Agency: [facility] Date [facility] Agency Contacted: 12/20/23 Time HH Agency Contacted: 1622 Representative spoke with at [facility] Agency: Arna  Prior Living Arrangements/Services Living arrangements for the past 2 months: Apartment Lives with:: Spouse Patient language and need for interpreter reviewed:: Yes Do you feel safe going back to the place where you live?: Yes      Need for Family Participation in Patient Care: Yes (Comment) Care giver support system in place?: Yes (comment) Current home services: DME (hospital bed, hemi walker, shower bench, toilet  riser.) Criminal Activity/Legal Involvement Pertinent to Current Situation/Hospitalization: No - Comment as needed  Activities of Daily Living   ADL Screening (condition at time of admission) Independently performs ADLs?: No Does the patient have a NEW difficulty with bathing/dressing/toileting/self-feeding that is expected to last >3 days?: No Does the patient have a NEW difficulty with getting in/out of bed, walking, or climbing stairs that is expected to last >3 days?: No Does the patient have a NEW difficulty with communication that is expected to last >3 days?: No Is the patient deaf or have difficulty hearing?: No Does the patient have difficulty seeing, even when wearing glasses/contacts?: No Does the patient have difficulty concentrating, remembering, or making decisions?: Yes  Permission Sought/Granted Permission sought to share information with : Case Manager, Family Supports, Oceanographer granted to share information with : Yes, Verbal Permission Granted     Permission granted to share info w AGENCY: [facility]        Emotional Assessment Appearance:: Appears stated age Attitude/Demeanor/Rapport: Engaged Affect (typically observed): Appropriate Orientation: : Oriented to Self, Oriented to Place, Oriented to  Time Alcohol  / Substance Use: Not Applicable Psych Involvement: No (comment)  Admission diagnosis:  CHF exacerbation (HCC) [I50.9] Acute on chronic right-sided congestive heart failure (HCC) [I50.813] Patient Active Problem List   Diagnosis Date Noted   Sinus pause 12/20/2023   Bilateral pleural effusion 12/19/2023   Ulcer of extremity due to chronic venous insufficiency (HCC) 12/18/2023  Type 2 diabetes mellitus (HCC) 12/18/2023   Paroxysmal A-fib (HCC) 12/01/2023   Atrial fibrillation with RVR (HCC) 12/01/2023   Hyperosmolar hyperglycemic state (HHS) (HCC) 12/01/2023   Chronic health problem 12/01/2023    Hypernatremia 12/01/2023   Essential hypertension 04/12/2023   Persistent atrial fibrillation (HCC) 04/11/2023   Acute on chronic diastolic heart failure (HCC) 04/09/2023   Class 2 obesity 03/19/2023   Open wound of right lower extremity 03/19/2023   Migraine headaches 03/19/2023   Acute on chronic diastolic CHF (congestive heart failure) (HCC) 03/18/2023   Sepsis (HCC) 01/17/2023   Cellulitis 01/17/2023   Hypokalemia 12/27/2022   Chronic heart failure with preserved ejection fraction (HFpEF, >= 50%) (HCC) 12/12/2022   Benign prostatic hyperplasia 01/05/2022   Coronary atherosclerosis 12/14/2021   Constipation 12/14/2021   Unspecified dementia, unspecified severity, without behavioral disturbance, psychotic disturbance, mood disturbance, and anxiety (HCC) 05/01/2021   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   Hemiplegia and hemiparesis following cerebral infarction affecting right dominant side (HCC) 01/04/2021   Aphasia following cerebral infarction 01/04/2021   Atrial fibrillation, chronic (HCC) 01/04/2021   Class 3 severe obesity due to excess calories with body mass index (BMI) of 45.0 to 49.9 in adult (HCC) 10/03/2017   Aphasia 07/02/2017   OSA (obstructive sleep apnea) 01/16/2016   CKD (chronic kidney disease) stage 3, GFR 30-59 ml/min (HCC) 08/24/2014   HLD (hyperlipidemia) 08/24/2014   Anxiety 08/24/2014   Type 2 diabetes mellitus with hyperlipidemia (HCC) 08/12/2014   Diabetic neuropathy (HCC) 08/12/2014   HTN (hypertension) 08/12/2014   History of CVA with residual deficit 08/12/2014   PCP:  [facility]acy:   [id number] - [place], [place] - [address] [place] [address] Phone: [phone] Fax: [phone]  [facility] [address] [place] [address] Phone: [phone] Fax: [phone]     Social Drivers of  Health (SDOH) Social History: SDOH Screenings   Food Insecurity: Food Insecurity Present (12/19/2023)  Housing: Low Risk  (12/19/2023)  Transportation Needs: No Transportation Needs (12/19/2023)  Utilities: At Risk (12/19/2023)  Tobacco Use: Low Risk  (12/19/2023)   SDOH Interventions:     Readmission Risk Interventions    04/10/2023    4:30 PM  Readmission Risk Prevention Plan  Transportation Screening Complete  PCP or Specialist Appt within 3-5 Days Complete  HRI or Home Care Consult Complete  Palliative Care Screening Not Applicable  Medication Review (RN Care Manager) Complete

## 2023-12-22 NOTE — Progress Notes (Unsigned)
 Cardiology Office Note:    Date:  12/23/2023   ID:  Justin Catha Raddle., DOB 1960/11/25, MRN [id number]  PCP:  Health, [facility]  Cardiologist:  Lonni LITTIE Nanas, MD  Electrophysiologist:  None   Referring MD: Health, [facility]   Chief Complaint  Patient presents with   Congestive Heart Failure    History of Present Illness:    Justin Coppolino. is a 63 y.o. male with a hx of chronic diastolic heart failure, hypertension, CVA, atrial fibrillation, PFO who presents follow-up.  He has longstanding history of resistant hypertension, which has led to complications including multiple CVAs with residual deficits, chronic diastolic heart failure, CKD.  Has had multiple admissions with acute on chronic diastolic heart failure.  Was discharged on 1425 after admission for IV diuresis for acute on chronic diastolic heart failure.  Echocardiogram 08/2023 showed EF 50 to 55%, normal RV function, and no significant valvular disease.  Since discharge from the hospital, he reports he is doing well.  Denies any chest pain, dyspnea, lightheadedness, syncope, lower extremity edema, or palpitations.  Denies any missed doses of Eliquis .   Past Medical History:  Diagnosis Date   Chronic renal insufficiency    Diabetes mellitus without complication (HCC)    Hypertension    Stroke [facility])     Past Surgical History:  Procedure Laterality Date   BRAIN SURGERY     GASTROSTOMY TUBE PLACEMENT     HERNIA REPAIR     TRACHEOSTOMY      Current Medications: Current Meds  Medication Sig   acetaminophen  (TYLENOL ) 500 MG tablet Take 1 tablet (500 mg total) by mouth every 6 (six) hours as needed. (Patient taking differently: Take 1,000 mg by mouth daily as needed for mild pain (pain score 1-3) or moderate pain (pain score 4-6).)   albuterol  (VENTOLIN  HFA) 108 (90 Base) MCG/ACT inhaler Inhale 2 puffs into the lungs every 4 (four) hours as needed for wheezing or shortness of breath.    amLODipine  (NORVASC ) 10 MG tablet Take 10 mg by mouth daily.   atorvastatin  (LIPITOR ) 80 MG tablet Take 1 tablet (80 mg total) by mouth at bedtime.   carvedilol  (COREG ) 12.5 MG tablet Take 1 tablet (12.5 mg total) by mouth 2 (two) times daily with a meal.   diltiazem (CARDIZEM CD) 300 MG 24 hr capsule Take 1 capsule (300 mg total) by mouth daily.   ELIQUIS  5 MG TABS tablet Take 1 tablet (5 mg total) by mouth 2 (two) times daily.   empagliflozin  (JARDIANCE ) 10 MG TABS tablet Take 1 tablet (10 mg total) by mouth daily.   fluticasone (FLONASE) 50 MCG/ACT nasal spray Place 2 sprays into both nostrils daily as needed for allergies or rhinitis.   glucose blood (ACCU-CHEK AVIVA PLUS) test strip Use as instructed for TID and QHS blood glucose testing   isosorbide  mononitrate (IMDUR ) 120 MG 24 hr tablet Take 1 tablet (120 mg total) by mouth daily.   Lancets 28G MISC Check blood sugar TID & QHS   loratadine  (CLARITIN ) 10 MG tablet Take 10 mg by mouth daily as needed for allergies or rhinitis.   losartan (COZAAR) 25 MG tablet Take 25 mg by mouth daily.   nystatin cream (MYCOSTATIN) Apply 1 Application topically daily as needed (yeast).   OZEMPIC, 0.25 OR 0.5 MG/DOSE, 2 MG/3ML SOPN Inject 0.25 mg into the skin every Sunday.   potassium chloride  SA (KLOR-CON  M) 20 MEQ tablet Take 1 tablet (20 mEq total) by mouth daily.  torsemide  (DEMADEX ) 20 MG tablet Take 2 tablets (40 mg total) by mouth daily. Can take an additional tablet prn shortness of breath     Allergies:   Contrast media [iodinated contrast media] and Iodine   Social History   Socioeconomic History   Marital status: Married    Spouse name: Not on file   Number of children: Not on file   Years of education: Not on file   Highest education level: Not on file  Occupational History   Not on file  Tobacco Use   Smoking status: Never   Smokeless tobacco: Never  Vaping Use   Vaping status: Never Used  Substance and Sexual Activity    Alcohol  use: No   Drug use: No   Sexual activity: Not on file  Other Topics Concern   Not on file  Social History Narrative   Not on file   Social Drivers of Health   Financial Resource Strain: Not on file  Food Insecurity: Food Insecurity Present (12/19/2023)   Hunger Vital Sign    Worried About Running Out of Food in the Last Year: Sometimes true    Ran Out of Food in the Last Year: Never true  Transportation Needs: No Transportation Needs (12/19/2023)   PRAPARE - Administrator, Civil Service (Medical): No    Lack of Transportation (Non-Medical): No  Physical Activity: Not on file  Stress: Not on file  Social Connections: Not on file     Family History: The patient's family history includes Anuerysm in his father.  ROS:   Please see the history of present illness.     All other systems reviewed and are negative.  EKGs/Labs/Other Studies Reviewed:    The following studies were reviewed today:   EKG:   12/23/2023: Atrial fibrillation, rate 89, left axis deviation  Recent Labs: 04/09/2023: TSH 0.831 05/15/2023: NT-Pro BNP 3,546 12/18/2023: ALT 10; B Natriuretic Peptide 441.5; Hemoglobin 13.9; Platelets 163 12/20/2023: BUN 23; Creatinine, Ser 2.04; Magnesium 2.3; Potassium 3.6; Sodium 140  Recent Lipid Panel No results found for: CHOL, TRIG, HDL, CHOLHDL, VLDL, LDLCALC, LDLDIRECT  Physical Exam:    VS:  BP 126/76   Pulse 89   Ht 5' 11 (1.803 m)   Wt 281 lb (127.5 kg)   SpO2 96%   BMI 39.19 kg/m     Wt Readings from Last 3 Encounters:  12/23/23 281 lb (127.5 kg)  12/20/23 284 lb 8 oz (129 kg)  12/06/23 278 lb 3.5 oz (126.2 kg)     GEN:  Well nourished, well developed in no acute distress HEENT: Normal NECK: No JVD; No carotid bruits LYMPHATICS: No lymphadenopathy CARDIAC: irregular, normal rate,  no murmurs, rubs, gallops RESPIRATORY:  Clear to auscultation without rales, wheezing or rhonchi  ABDOMEN: Soft, non-tender,  non-distended MUSCULOSKELETAL:  No edema; No deformity  SKIN: Warm and dry NEUROLOGIC:  Alert and oriented x 3 PSYCHIATRIC:  Normal affect   ASSESSMENT:    1. Persistent atrial fibrillation (HCC)   2. Chronic heart failure with preserved ejection fraction (HCC)   3. Resistant hypertension    PLAN:    Chronic diastolic heart failure: Echocardiogram 08/2023 showed EF 50 to 55%, normal RV function, and no significant valvular disease.  Admitted earlier this month with acute on chronic diastolic heart failure, treated with IV diuresis - Continue torsemide  40 mg daily.  Appears euvolemic.  Check BMET, magnesium - Continue Jardiance   Atrial fibrillation: Continue Eliquis  5 mg twice daily.  Was  in RVR during recent admission, his rate control was titrated.  Continue diltiazem 300 mg daily and carvedilol  12.5 mg daily, appears rate controlled.  Possible that A-fib is contributing to his diastolic heart failure.  Per prior EKGs, was in sinus rhythm 01/2023 but EKGs since that time have shown A-fib.  Will attempt DCCV.  Denies any missed doses of Eliquis   Resistant hypertension: Continue losartan 25 mg daily, Imdur  120 mg daily, amlodipine  10 mg daily, carvedilol  12.5 mg twice daily, diltiazem 300 mg daily.  Appears controlled  Hyperlipidemia: Continue atorvastatin  80 mg daily  T2DM: On Jardiance  and Ozempic  RTC in 1 month   Medication Adjustments/Labs and Tests Ordered: Current medicines are reviewed at length with the patient today.  Concerns regarding medicines are outlined above.  Orders Placed This Encounter  Procedures   Basic Metabolic Panel (BMET)   Magnesium   CBC   EKG 12-Lead   No orders of the defined types were placed in this encounter.   Patient Instructions  Medication Instructions:  Torsemide  Take 40 mg in the morning. *If you need a refill on your cardiac medications before your next appointment, please call your pharmacy*  Lab Work: BMET, CBC, Magnesium  If you  have labs (blood work) drawn today and your tests are completely normal, you will receive your results only by: MyChart Message (if you have MyChart) OR A paper copy in the mail If you have any lab test that is abnormal or we need to change your treatment, we will call you to review the results.  Testing/Procedures: NONE  Follow-Up: At [facility], you and your health needs are our priority.  As part of our continuing mission to provide you with exceptional heart care, our providers are all part of one team.  This team includes your primary Cardiologist (physician) and Advanced Practice Providers or APPs (Physician Assistants and Nurse Practitioners) who all work together to provide you with the care you need, when you need it.  Your next appointment:   Keep 01/20/24   Provider:   Lonni LITTIE Nanas, MD    We recommend signing up for the patient portal called MyChart.  Sign up information is provided on this After Visit Summary.  MyChart is used to connect with patients for Virtual Visits (Telemedicine).  Patients are able to view lab/test results, encounter notes, upcoming appointments, etc.  Non-urgent messages can be sent to your provider as well.   To learn more about what you can do with MyChart, go to [email or web address].   Other Instructions Do not take Torsemide  the morning of procedure.     Dear Justin Catha Raddle.  You are scheduled for a Cardioversion on Tuesday, December 2 with Dr. Lonni.  Please arrive at the [facility] (Main Entrance A) at [facility]: [address] [place], [place] [address] at 8:30 AM (This time is 1 hour(s) before your procedure to ensure your preparation).   Free valet parking service is available. You will check in at ADMITTING.   *Please Note: You will receive a call the day before your procedure to confirm the appointment time. That time may have changed from the original time based on the schedule for that day.*     DIET:  Nothing to eat or drink after midnight except a sip of water with medications (see medication instructions below)   HOLD: Semaglutide (Ozempic, Rybelsus, Wegovy) for 1 week prior to the procedure. Last dose on Sunday, November 23.2025 HOLD: Empagliflozin  (Jardiance ) for  3 days prior to the procedure. Last dose on Friday, November 28.  Continue taking your anticoagulant (blood thinner): Apixaban  (Eliquis ).  You will need to continue this after your procedure until you are told by your provider that it is safe to stop.    LABS:   Come to the lab at the [facility] ([address] [place], 1st Floor) between the hours of 8:00 am and 4:30 pm. You do NOT have to be fasting.  FYI:  For your safety, and to allow us  to monitor your vital signs accurately during the surgery/procedure we request: If you have artificial nails, gel coating, SNS etc, please have those removed prior to your surgery/procedure. Not having the nail coverings /polish removed may result in cancellation or delay of your surgery/procedure.  Your support person will be asked to wait in the waiting room during your procedure.  It is OK to have someone drop you off and come back when you are ready to be discharged.  You cannot drive after the procedure and will need someone to drive you home.  Bring your insurance cards.  *Special Note: Every effort is made to have your procedure done on time. Occasionally there are emergencies that occur at the hospital that may cause delays. Please be patient if a delay does occur.               Signed, Lonni LITTIE Nanas, MD  12/23/2023 5:10 PM    [facility]

## 2023-12-22 NOTE — H&P (View-Only) (Signed)
 Cardiology Office Note:    Date:  12/23/2023   ID:  Justin Molina., DOB [birth year], MRN [id number]  PCP:  [facility], [facility]  Cardiologist:  Lonni LITTIE Nanas, MD  Electrophysiologist:  None   Referring MD: Health, [facility]   Chief Complaint  Patient presents with   Congestive Heart Failure    History of Present Illness:    Justin Molina. is a 63 y.o. male with a hx of chronic diastolic heart failure, hypertension, CVA, atrial fibrillation, PFO who presents follow-up.  He has longstanding history of resistant hypertension, which has led to complications including multiple CVAs with residual deficits, chronic diastolic heart failure, CKD.  Has had multiple admissions with acute on chronic diastolic heart failure.  Was discharged on 1425 after admission for IV diuresis for acute on chronic diastolic heart failure.  Echocardiogram 08/2023 showed EF 50 to 55%, normal RV function, and no significant valvular disease.  Since discharge from the hospital, he reports he is doing well.  Denies any chest pain, dyspnea, lightheadedness, syncope, lower extremity edema, or palpitations.  Denies any missed doses of Eliquis .   Past Medical History:  Diagnosis Date   Chronic renal insufficiency    Diabetes mellitus without complication (HCC)    Hypertension    Stroke [facility])     Past Surgical History:  Procedure Laterality Date   BRAIN SURGERY     GASTROSTOMY TUBE PLACEMENT     HERNIA REPAIR     TRACHEOSTOMY      Current Medications: Current Meds  Medication Sig   acetaminophen  (TYLENOL ) 500 MG tablet Take 1 tablet (500 mg total) by mouth every 6 (six) hours as needed. (Patient taking differently: Take 1,000 mg by mouth daily as needed for mild pain (pain score 1-3) or moderate pain (pain score 4-6).)   albuterol  (VENTOLIN  HFA) 108 (90 Base) MCG/ACT inhaler Inhale 2 puffs into the lungs every 4 (four) hours as needed for wheezing or shortness of breath.    amLODipine  (NORVASC ) 10 MG tablet Take 10 mg by mouth daily.   atorvastatin  (LIPITOR ) 80 MG tablet Take 1 tablet (80 mg total) by mouth at bedtime.   carvedilol  (COREG ) 12.5 MG tablet Take 1 tablet (12.5 mg total) by mouth 2 (two) times daily with a meal.   diltiazem  (CARDIZEM  CD) 300 MG 24 hr capsule Take 1 capsule (300 mg total) by mouth daily.   ELIQUIS  5 MG TABS tablet Take 1 tablet (5 mg total) by mouth 2 (two) times daily.   empagliflozin  (JARDIANCE ) 10 MG TABS tablet Take 1 tablet (10 mg total) by mouth daily.   fluticasone (FLONASE) 50 MCG/ACT nasal spray Place 2 sprays into both nostrils daily as needed for allergies or rhinitis.   glucose blood (ACCU-CHEK AVIVA PLUS) test strip Use as instructed for TID and QHS blood glucose testing   isosorbide  mononitrate (IMDUR ) 120 MG 24 hr tablet Take 1 tablet (120 mg total) by mouth daily.   Lancets 28G MISC Check blood sugar TID & QHS   loratadine  (CLARITIN ) 10 MG tablet Take 10 mg by mouth daily as needed for allergies or rhinitis.   losartan  (COZAAR ) 25 MG tablet Take 25 mg by mouth daily.   nystatin cream (MYCOSTATIN) Apply 1 Application topically daily as needed (yeast).   OZEMPIC, 0.25 OR 0.5 MG/DOSE, 2 MG/3ML SOPN Inject 0.25 mg into the skin every Sunday.   potassium chloride  SA (KLOR-CON  M) 20 MEQ tablet Take 1 tablet (20 mEq total) by mouth daily.  torsemide  (DEMADEX ) 20 MG tablet Take 2 tablets (40 mg total) by mouth daily. Can take an additional tablet prn shortness of breath     Allergies:   Contrast media [iodinated contrast media] and Iodine   Social History   Socioeconomic History   Marital status: Married    Spouse name: Not on file   Number of children: Not on file   Years of education: Not on file   Highest education level: Not on file  Occupational History   Not on file  Tobacco Use   Smoking status: Never   Smokeless tobacco: Never  Vaping Use   Vaping status: Never Used  Substance and Sexual Activity    Alcohol  use: No   Drug use: No   Sexual activity: Not on file  Other Topics Concern   Not on file  Social History Narrative   Not on file   Social Drivers of Health   Financial Resource Strain: Not on file  Food Insecurity: Food Insecurity Present (12/19/2023)   Hunger Vital Sign    Worried About Running Out of Food in the Last Year: Sometimes true    Ran Out of Food in the Last Year: Never true  Transportation Needs: No Transportation Needs (12/19/2023)   PRAPARE - Administrator, Civil Service (Medical): No    Lack of Transportation (Non-Medical): No  Physical Activity: Not on file  Stress: Not on file  Social Connections: Not on file     Family History: The patient's family history includes Anuerysm in his father.  ROS:   Please see the history of present illness.     All other systems reviewed and are negative.  EKGs/Labs/Other Studies Reviewed:    The following studies were reviewed today:   EKG:   12/23/2023: Atrial fibrillation, rate 89, left axis deviation  Recent Labs: 04/09/2023: TSH 0.831 05/15/2023: NT-Pro BNP 3,546 12/18/2023: ALT 10; B Natriuretic Peptide 441.5; Hemoglobin 13.9; Platelets 163 12/20/2023: BUN 23; Creatinine, Ser 2.04; Magnesium  2.3; Potassium 3.6; Sodium 140  Recent Lipid Panel No results found for: CHOL, TRIG, HDL, CHOLHDL, VLDL, LDLCALC, LDLDIRECT  Physical Exam:    VS:  BP 126/76   Pulse 89   Ht 5' 11 (1.803 m)   Wt 281 lb (127.5 kg)   SpO2 96%   BMI 39.19 kg/m     Wt Readings from Last 3 Encounters:  12/23/23 281 lb (127.5 kg)  12/20/23 284 lb 8 oz (129 kg)  12/06/23 278 lb 3.5 oz (126.2 kg)     GEN:  Well nourished, well developed in no acute distress HEENT: Normal NECK: No JVD; No carotid bruits LYMPHATICS: No lymphadenopathy CARDIAC: irregular, normal rate,  no murmurs, rubs, gallops RESPIRATORY:  Clear to auscultation without rales, wheezing or rhonchi  ABDOMEN: Soft, non-tender,  non-distended MUSCULOSKELETAL:  No edema; No deformity  SKIN: Warm and dry NEUROLOGIC:  Alert and oriented x 3 PSYCHIATRIC:  Normal affect   ASSESSMENT:    1. Persistent atrial fibrillation (HCC)   2. Chronic heart failure with preserved ejection fraction (HCC)   3. Resistant hypertension    PLAN:    Chronic diastolic heart failure: Echocardiogram 08/2023 showed EF 50 to 55%, normal RV function, and no significant valvular disease.  Admitted earlier this month with acute on chronic diastolic heart failure, treated with IV diuresis - Continue torsemide  40 mg daily.  Appears euvolemic.  Check BMET, magnesium  - Continue Jardiance   Atrial fibrillation: Continue Eliquis  5 mg twice daily.  Was  in RVR during recent admission, his rate control was titrated.  Continue diltiazem  300 mg daily and carvedilol  12.5 mg daily, appears rate controlled.  Possible that A-fib is contributing to his diastolic heart failure.  Per prior EKGs, was in sinus rhythm 01/2023 but EKGs since that time have shown A-fib.  Will attempt DCCV.  Denies any missed doses of Eliquis   Resistant hypertension: Continue losartan  25 mg daily, Imdur  120 mg daily, amlodipine  10 mg daily, carvedilol  12.5 mg twice daily, diltiazem  300 mg daily.  Appears controlled  Hyperlipidemia: Continue atorvastatin  80 mg daily  T2DM: On Jardiance  and Ozempic  RTC in 1 month   Medication Adjustments/Labs and Tests Ordered: Current medicines are reviewed at length with the patient today.  Concerns regarding medicines are outlined above.  Orders Placed This Encounter  Procedures   Basic Metabolic Panel (BMET)   Magnesium    CBC   EKG 12-Lead   No orders of the defined types were placed in this encounter.   Patient Instructions  Medication Instructions:  Torsemide  Take 40 mg in the morning. *If you need a refill on your cardiac medications before your next appointment, please call your pharmacy*  Lab Work: BMET, CBC, Magnesium   If you  have labs (blood work) drawn today and your tests are completely normal, you will receive your results only by: MyChart Message (if you have MyChart) OR A paper copy in the mail If you have any lab test that is abnormal or we need to change your treatment, we will call you to review the results.  Testing/Procedures: NONE  Follow-Up: At [facility], you and your health needs are our priority.  As part of our continuing mission to provide you with exceptional heart care, our providers are all part of one team.  This team includes your primary Cardiologist (physician) and Advanced Practice Providers or APPs (Physician Assistants and Nurse Practitioners) who all work together to provide you with the care you need, when you need it.  Your next appointment:   Keep 01/20/24   Provider:   Lonni LITTIE Nanas, MD    We recommend signing up for the patient portal called MyChart.  Sign up information is provided on this After Visit Summary.  MyChart is used to connect with patients for Virtual Visits (Telemedicine).  Patients are able to view lab/test results, encounter notes, upcoming appointments, etc.  Non-urgent messages can be sent to your provider as well.   To learn more about what you can do with MyChart, go to [email or web address].   Other Instructions Do not take Torsemide  the morning of procedure.     Dear Justin Molina.  You are scheduled for a Cardioversion on Tuesday, December 2 with Dr. Lonni.  Please arrive at the [facility] (Main Entrance A) at [facility]: [address] [place], [place] [address] at 8:30 AM (This time is 1 hour(s) before your procedure to ensure your preparation).   Free valet parking service is available. You will check in at ADMITTING.   *Please Note: You will receive a call the day before your procedure to confirm the appointment time. That time may have changed from the original time based on the schedule for that day.*     DIET:  Nothing to eat or drink after midnight except a sip of water with medications (see medication instructions below)   HOLD: Semaglutide (Ozempic, Rybelsus, Wegovy) for 1 week prior to the procedure. Last dose on Sunday, November 23.2025 HOLD: Empagliflozin  (Jardiance ) for  3 days prior to the procedure. Last dose on Friday, November 28.  Continue taking your anticoagulant (blood thinner): Apixaban  (Eliquis ).  You will need to continue this after your procedure until you are told by your provider that it is safe to stop.    LABS:   Come to the lab at the [facility] ([address], [place], 1st Floor) between the hours of 8:00 am and 4:30 pm. You do NOT have to be fasting.  FYI:  For your safety, and to allow us  to monitor your vital signs accurately during the surgery/procedure we request: If you have artificial nails, gel coating, SNS etc, please have those removed prior to your surgery/procedure. Not having the nail coverings /polish removed may result in cancellation or delay of your surgery/procedure.  Your support person will be asked to wait in the waiting room during your procedure.  It is OK to have someone drop you off and come back when you are ready to be discharged.  You cannot drive after the procedure and will need someone to drive you home.  Bring your insurance cards.  *Special Note: Every effort is made to have your procedure done on time. Occasionally there are emergencies that occur at the hospital that may cause delays. Please be patient if a delay does occur.               Signed, Lonni LITTIE Nanas, MD  12/23/2023 5:10 PM    [facility]

## 2023-12-23 ENCOUNTER — Ambulatory Visit: Attending: Cardiology | Admitting: Cardiology

## 2023-12-23 ENCOUNTER — Encounter: Payer: Self-pay | Admitting: Cardiology

## 2023-12-23 VITALS — BP 126/76 | HR 89 | Ht 71.0 in | Wt 281.0 lb

## 2023-12-23 DIAGNOSIS — I5032 Chronic diastolic (congestive) heart failure: Secondary | ICD-10-CM

## 2023-12-23 DIAGNOSIS — I1A0 Resistant hypertension: Secondary | ICD-10-CM

## 2023-12-23 DIAGNOSIS — I4819 Other persistent atrial fibrillation: Secondary | ICD-10-CM

## 2023-12-23 NOTE — Patient Instructions (Addendum)
 Medication Instructions:  Torsemide  Take 40 mg in the morning. *If you need a refill on your cardiac medications before your next appointment, please call your pharmacy*  Lab Work: BMET, CBC, Magnesium  If you have labs (blood work) drawn today and your tests are completely normal, you will receive your results only by: MyChart Message (if you have MyChart) OR A paper copy in the mail If you have any lab test that is abnormal or we need to change your treatment, we will call you to review the results.  Testing/Procedures: NONE  Follow-Up: At [facility], you and your health needs are our priority.  As part of our continuing mission to provide you with exceptional heart care, our providers are all part of one team.  This team includes your primary Cardiologist (physician) and Advanced Practice Providers or APPs (Physician Assistants and Nurse Practitioners) who all work together to provide you with the care you need, when you need it.  Your next appointment:   Keep 01/20/24   Provider:   Lonni LITTIE Nanas, MD    We recommend signing up for the patient portal called MyChart.  Sign up information is provided on this After Visit Summary.  MyChart is used to connect with patients for Virtual Visits (Telemedicine).  Patients are able to view lab/test results, encounter notes, upcoming appointments, etc.  Non-urgent messages can be sent to your provider as well.   To learn more about what you can do with MyChart, go to [email or web address].   Other Instructions Do not take Torsemide  the morning of procedure.     Dear Justin Molina.  You are scheduled for a Cardioversion on Tuesday, December 2 with Dr. Lonni.  Please arrive at the [facility] (Main Entrance A) at [facility]: [address] [place], [place] [address] at 8:30 AM (This time is 1 hour(s) before your procedure to ensure your preparation).   Free valet parking service is available. You will  check in at ADMITTING.   *Please Note: You will receive a call the day before your procedure to confirm the appointment time. That time may have changed from the original time based on the schedule for that day.*    DIET:  Nothing to eat or drink after midnight except a sip of water with medications (see medication instructions below)   HOLD: Semaglutide (Ozempic, Rybelsus, Wegovy) for 1 week prior to the procedure. Last dose on Sunday, November 23.2025 HOLD: Empagliflozin  (Jardiance ) for 3 days prior to the procedure. Last dose on Friday, November 28.  Continue taking your anticoagulant (blood thinner): Apixaban  (Eliquis ).  You will need to continue this after your procedure until you are told by your provider that it is safe to stop.    LABS:   Come to the lab at the [facility] ([address] [place], 1st Floor) between the hours of 8:00 am and 4:30 pm. You do NOT have to be fasting.  FYI:  For your safety, and to allow us  to monitor your vital signs accurately during the surgery/procedure we request: If you have artificial nails, gel coating, SNS etc, please have those removed prior to your surgery/procedure. Not having the nail coverings /polish removed may result in cancellation or delay of your surgery/procedure.  Your support person will be asked to wait in the waiting room during your procedure.  It is OK to have someone drop you off and come back when you are ready to  be discharged.  You cannot drive after the procedure and will need someone to drive you home.  Bring your insurance cards.  *Special Note: Every effort is made to have your procedure done on time. Occasionally there are emergencies that occur at the hospital that may cause delays. Please be patient if a delay does occur.

## 2023-12-27 ENCOUNTER — Inpatient Hospital Stay: Payer: Self-pay

## 2023-12-27 NOTE — Progress Notes (Deleted)
    SUBJECTIVE:   CHIEF COMPLAINT / HPI:   Justin Emig. is a 63 y.o. male presenting for hospital follow-up; he is a new patient to our clinic.  Hospital Follow-up He was hospitalized 11/12-11/14/2025 for dyspnea secondary to CHF exacerbation.  He had previously been hospitalized with us  11/2023 for A-fib with RVR and HHS and had been instructed to hold torsemide  until following up with cardiology at discharge; however, patient was unable to follow-up and as such did not resume torsemide .  Prior to this recent admission, he did experience weight gain and trouble breathing.  Was treated with IV Lasix , later transition to p.o. with torsemide  40 mg daily and discharged on same.  He followed up with cardiology on 11/17, who continued him on torsemide  40 mg daily.  Did not adjust any of his medications.  Next appointment with cardiology scheduled for 01/20/2024. Considering DCCV. ***Scheduled labs?  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  ***  ASSESSMENT/PLAN:   Assessment & Plan Type 2 diabetes mellitus with other specified complication, with long-term current use of insulin  (HCC)  Healthcare maintenance     Follow-up within the next month for yearly physical.  Alan Flies, MD [facility]

## 2023-12-31 ENCOUNTER — Ambulatory Visit (INDEPENDENT_AMBULATORY_CARE_PROVIDER_SITE_OTHER): Admitting: Family Medicine

## 2023-12-31 VITALS — BP 132/85 | HR 80 | Ht 71.0 in | Wt 281.4 lb

## 2023-12-31 DIAGNOSIS — I480 Paroxysmal atrial fibrillation: Secondary | ICD-10-CM | POA: Diagnosis not present

## 2023-12-31 DIAGNOSIS — I10 Essential (primary) hypertension: Secondary | ICD-10-CM

## 2023-12-31 DIAGNOSIS — E1169 Type 2 diabetes mellitus with other specified complication: Secondary | ICD-10-CM

## 2023-12-31 DIAGNOSIS — I872 Venous insufficiency (chronic) (peripheral): Secondary | ICD-10-CM

## 2023-12-31 DIAGNOSIS — G4733 Obstructive sleep apnea (adult) (pediatric): Secondary | ICD-10-CM | POA: Diagnosis not present

## 2023-12-31 DIAGNOSIS — I5032 Chronic diastolic (congestive) heart failure: Secondary | ICD-10-CM | POA: Diagnosis not present

## 2023-12-31 DIAGNOSIS — L98499 Non-pressure chronic ulcer of skin of other sites with unspecified severity: Secondary | ICD-10-CM

## 2023-12-31 DIAGNOSIS — E785 Hyperlipidemia, unspecified: Secondary | ICD-10-CM

## 2023-12-31 MED ORDER — DILTIAZEM HCL ER COATED BEADS 300 MG PO CP24: 300.0000 mg | ORAL_CAPSULE | Freq: Every day | ORAL | 3 refills | Status: DC

## 2023-12-31 NOTE — Assessment & Plan Note (Signed)
 STOP-BANG score of 5. - Ambulatory ref to Pulm for sleep study

## 2023-12-31 NOTE — Patient Instructions (Addendum)
 It was great to see you today! Thank you for choosing [facility] for your primary care.  Today we addressed: I am referring you to pulmonology for a sleep study for sleep apnea:  If you had a referral placed, they will call you to set up an appointment. Please give us  a call if you don't hear back in the next 2 weeks.  Your diabetes is being treated well.  We may increase your Ozempic when you return in 1 month.  Below is a copy of the information from you cardiologist about the procedure on December 2nd  Dear Justin Molina.  You are scheduled for a Cardioversion on Tuesday, December 2 with Dr. Lonni.  Please arrive at the [facility] (Main Entrance A) at [facility]: [address] [place], [place] [address] at 8:30 AM (This time is 1 hour(s) before your procedure to ensure your preparation).    Free valet parking service is available. You will check in at ADMITTING.    *Please Note: You will receive a call the day before your procedure to confirm the appointment time. That time may have changed from the original time based on the schedule for that day.*    DIET:  Nothing to eat or drink after midnight except a sip of water with medications (see medication instructions below)     HOLD: Semaglutide (Ozempic, Rybelsus, Wegovy) for 1 week prior to the procedure. Last dose on Sunday, November 23.2025 HOLD: Empagliflozin  (Jardiance ) for 3 days prior to the procedure. Last dose on Friday, November 28.  Continue taking your anticoagulant (blood thinner): Apixaban  (Eliquis ).  You will need to continue this after your procedure until you are told by your provider that it is safe to stop.     LABS:   Come to the lab at the [facility] ([address], [place], 1st Floor) between the hours of 8:00 am and 4:30 pm. You do NOT have to be fasting.   FYI:  For your safety, and to allow us  to monitor your vital signs accurately  during the surgery/procedure we request: If you have artificial nails, gel coating, SNS etc, please have those removed prior to your surgery/procedure. Not having the nail coverings /polish removed may result in cancellation or delay of your surgery/procedure.   Your support person will be asked to wait in the waiting room during your procedure.  It is OK to have someone drop you off and come back when you are ready to be discharged.  You cannot drive after the procedure and will need someone to drive you home.   Bring your insurance cards.   *Special Note: Every effort is made to have your procedure done on time. Occasionally there are emergencies that occur at the hospital that may cause delays. Please be patient if a delay does occur.     You should return to our clinic in 1 month.  Thank you for coming to see us  at [facility] and for the opportunity to care for you! Toma, Asher Babilonia, MD 12/31/2023, 4:28 PM

## 2023-12-31 NOTE — Assessment & Plan Note (Signed)
 Euvolemic today, weight stable at 281 lbs from hospital discharge.  Most recent EF 50-55% in July 2025.  Follows with Cardiology and advanced heart failure clinic. - Continue losartan , carvedilol , Jardiance , and Ozempic - Set up with [facility] PT, now started at home - BMP, mag

## 2023-12-31 NOTE — Assessment & Plan Note (Signed)
 Well controlled. - Continue losartan  25 mg daily - Continue amlodipine  10 mg daily

## 2023-12-31 NOTE — Assessment & Plan Note (Signed)
 No open wound today, visible hypopigmentation and venous stasis skin changes.  Following with [facility] wound care, but reportedly may stop going due to significant recovery.

## 2023-12-31 NOTE — Assessment & Plan Note (Signed)
 Poorly controlled 1 month ago. - Continue Ozempic 0.25 mg (restarted 1 week ago), titrate up at return visit - Continue Jardiance  10 mg - Urine microalbumin/creatinine on lab return visit

## 2023-12-31 NOTE — Assessment & Plan Note (Signed)
 History of RVR, on anticoagulation. - Continue Eliquis  - Continue diltiazem  300 mg, refilled today - Provided instructions from 11/17 Cardiology note to patient and spouse regarding medications to hold and continue for DCCV planned for 12/2 - CBC

## 2023-12-31 NOTE — Progress Notes (Addendum)
 SUBJECTIVE:   CHIEF COMPLAINT / HPI:  Justin Molina. is a 63 y.o. male with pertinent PMH of T2DM, HTN, CKD, HLD, aphasia s/p remote CVA, right hemiplegia, OSA, Afib, HFpEF, and CAD presenting to the clinic to establish care and discuss his recent hospitalization 11/12-11/14.  Concerns today:  Hospital follow up: Acute exacerbation of HFpEF The patient was admitted to [facility] from 11/12-11/14 for CHF exacerbation.  He was admitted to [facility] before that in October 2025 for Afib RVR and HHS and was told to hold torsemide  at discharge until Cardiology follow up.  When he presented on 11/12, he had orthopnea, LE edema, increased O2 requirement, and ~20 lbs weight gain over 2 days.  Diuresis was performed with IV Lasix  and patient weaned to room air; transitioned to oral torsemide  40 mg daily on 11/14 and discharged on same.  Continued on home medications (Losartan  25 mg daily, Jardiance  10 mg daily, Imdur  120 mg daily); carvedilol  was deceased from 25 mg BID to 12.5 mg BID given a few brief sinus pauses seen overnight.  The patient was seen by Cardiology on 11/17 for follow up and continued on torsemide  40 mg daily, Jardiance .  They wondered if Afib is contributing to CHF and DCCV scheduled as below.  Today, patient and wife report he has been doing well since discharge.  They have been checking weight at home regularly and it has remained stable.  Afib RVR Patient was in Afib RVR during admission.  Was evaluated by Cardiology outpatient on 11/17 and continued on Eliquis  5 mg BID as well as diltiazem  300 mg daily, carvedilol  12.5 mg daily with good rate control.  Considered possible contribution of Afib to CHF if rate dependent dysfunction.  DCCV scheduled for 12/02.  Right lower extremity pain Nonhealing leg wounds Had right leg pain and R>L edema on admission; had been compliant with Eliquis  at home, but given findings concern for potential DVT.  Bilateral lower extremity ultrasound was done  11/13, which showed no evidence of DVT bilaterally.  He had been receiving Unna boot to the right leg outpatient, which was resumed inpatient.  Today, patient's wife reports that patient has been following with Dr. Charlie Margo with [facility] in [facility], who reportedly is considering stopping follow up due to good healing. Patient and wife state that his leg wounds have been healing well.  Type 2 diabetes mellitus - Prior A1c 9.4 on 10/26 - Home BGs: Has Freestyle Libre sensor, shows 81% time in range and 6.6% GMI - Medications: Restarted Ozempic 0.25 mg last Sunday, also taking Jardiance  10 mg - Adherence: Good - Eye exam: Due - Foot exam: Due - Microalbumin: Due - Statin: Atorvastatin  80 mg daily - No symptoms of hypoglycemia, polyuria, polydipsia, numbness of extremities, foot ulcers/trauma  PERTINENT PMH / PSH:  Social History Prior PCP: [facility] Last went to doctor prior to October hospitalization Occupation: Retired Lives with: Wife Smoking/Vaping: None Alcohol : None Marijuana/ilicit substances: None Exercise: Minimal due to hemiplegia  Notably, patient is a [employer].   OBJECTIVE:   BP 132/85   Pulse 80   Ht 5' 11 (1.803 m)   Wt 281 lb 6.4 oz (127.6 kg)   SpO2 99%   BMI 39.25 kg/m   General: Age-appropriate, resting comfortably in chair, NAD, alert and at baseline. Cardiovascular: No JVD. Regular rate and rhythm. Normal S1/S2. No murmurs, rubs, or gallops appreciated. 2+ radial pulses. Pulmonary: Clear bilaterally to ascultation. No wheezes, crackles, or rhonchi. Normal WOB  on room air. No accessory muscle use. Abdominal: No tenderness to deep or light palpation. No rebound or guarding. No HSM. Skin: Warm and dry.  Venous stasis skin changes overlying bilateral LE. Extremities: 1+ bilateral peripheral edema to mid-shin.  Capillary refill <2 seconds. Neuro: Right-sided hemiplegia.  Right hand contracture.  Antalgic gait, but able to ambulate with  cane.  STOP-BANG Score for OSA (Y=+1; >=3 is positive result) Snores loudly? 0 Tired, fatigued, or sleepy during the day time? +1 Observed stop breathing at night? 0 High blood pressure? 0 BMI >35? +1 AGE >50? +1 Neck circumference >40 cm? +1 Gender male? +1 SCORE: 5   ASSESSMENT/PLAN:   Assessment & Plan Chronic heart failure with preserved ejection fraction (HFpEF, >= 50%) (HCC) Euvolemic today, weight stable at 281 lbs from hospital discharge.  Most recent EF 50-55% in July 2025.  Follows with Cardiology and advanced heart failure clinic. - Continue losartan , carvedilol , Jardiance , and Ozempic - Set up with [facility] PT, now started at home - BMP, mag OSA (obstructive sleep apnea) STOP-BANG score of 5. - Ambulatory ref to Pulm for sleep study Paroxysmal A-fib (HCC) History of RVR, on anticoagulation. - Continue Eliquis  - Continue diltiazem  300 mg, refilled today - Provided instructions from 11/17 Cardiology note to patient and spouse regarding medications to hold and continue for DCCV planned for 12/2 - CBC Essential hypertension Well controlled. - Continue losartan  25 mg daily - Continue amlodipine  10 mg daily Type 2 diabetes mellitus with hyperlipidemia (HCC) Poorly controlled 1 month ago. - Continue Ozempic 0.25 mg (restarted 1 week ago), titrate up at return visit - Continue Jardiance  10 mg - Urine microalbumin/creatinine on lab return visit Ulcer of extremity due to chronic venous insufficiency (HCC) No open wound today, visible hypopigmentation and venous stasis skin changes.  Following with [facility] wound care, but reportedly may stop going due to significant recovery.  Follow up in 3 weeks to discuss T2DM, check in after Afib. Lab closed today, but return tomorrow for CBC, BMP, magnesium , urine microalbumin/creatinine ratio  Anhad Sheeley Toma, MD [facility]

## 2024-01-01 ENCOUNTER — Other Ambulatory Visit: Payer: Self-pay

## 2024-01-01 DIAGNOSIS — I10 Essential (primary) hypertension: Secondary | ICD-10-CM

## 2024-01-01 DIAGNOSIS — E1169 Type 2 diabetes mellitus with other specified complication: Secondary | ICD-10-CM

## 2024-01-01 DIAGNOSIS — I480 Paroxysmal atrial fibrillation: Secondary | ICD-10-CM

## 2024-01-01 DIAGNOSIS — I5032 Chronic diastolic (congestive) heart failure: Secondary | ICD-10-CM

## 2024-01-02 LAB — BASIC METABOLIC PANEL WITH GFR
BUN/Creatinine Ratio: 12 (ref 10–24)
BUN: 20 mg/dL (ref 8–27)
CO2: 25 mmol/L (ref 20–29)
Calcium: 8.9 mg/dL (ref 8.6–10.2)
Chloride: 102 mmol/L (ref 96–106)
Creatinine, Ser: 1.69 mg/dL — ABNORMAL HIGH (ref 0.76–1.27)
Glucose: 85 mg/dL (ref 70–99)
Potassium: 4 mmol/L (ref 3.5–5.2)
Sodium: 143 mmol/L (ref 134–144)
eGFR: 45 mL/min/1.73 — ABNORMAL LOW (ref >59)

## 2024-01-02 LAB — CBC
Hematocrit: 49.4 % (ref 37.5–51.0)
Hemoglobin: 16.7 g/dL (ref 13.0–17.7)
MCH: 28.5 pg (ref 26.6–33.0)
MCHC: 33.8 g/dL (ref 31.5–35.7)
MCV: 84 fL (ref 79–97)
Platelets: 153 x10E3/uL (ref 150–450)
RBC: 5.86 x10E6/uL — ABNORMAL HIGH (ref 4.14–5.80)
RDW: 16.4 % — ABNORMAL HIGH (ref 11.6–15.4)
WBC: 3.9 x10E3/uL (ref 3.4–10.8)

## 2024-01-02 LAB — MAGNESIUM: Magnesium: 2.3 mg/dL (ref 1.6–2.3)

## 2024-01-06 NOTE — Progress Notes (Addendum)
 Called patient with pre-procedure instructions for tomorrow.   Patient informed of:   Time to arrive for procedure (0800). Remain NPO past midnight.  Must have a ride home and a responsible adult to remain with them for 24 hours post procedure. Instructed to take am meds with sip of water and confirmed blood thinner consistency. Confirmed patient stopped GLP-1s/GLP-2s for at least one week/three days before procedure.  Patient reports understanding and no other questions at this time.

## 2024-01-07 ENCOUNTER — Ambulatory Visit (HOSPITAL_COMMUNITY)

## 2024-01-07 ENCOUNTER — Encounter (HOSPITAL_COMMUNITY)
Admission: RE | Disposition: A | Discharge status: Home / Self Care | Payer: Self-pay | Source: Home / Self Care | Attending: Cardiology

## 2024-01-07 ENCOUNTER — Encounter (HOSPITAL_COMMUNITY): Payer: Self-pay | Admitting: Cardiology

## 2024-01-07 ENCOUNTER — Ambulatory Visit (HOSPITAL_COMMUNITY)
Admission: RE | Admit: 2024-01-07 | Discharge: 2024-01-07 | Disposition: A | Attending: Cardiology | Admitting: Cardiology

## 2024-01-07 ENCOUNTER — Ambulatory Visit: Admitting: Cardiology

## 2024-01-07 ENCOUNTER — Other Ambulatory Visit: Payer: Self-pay

## 2024-01-07 DIAGNOSIS — E1122 Type 2 diabetes mellitus with diabetic chronic kidney disease: Secondary | ICD-10-CM

## 2024-01-07 DIAGNOSIS — I5031 Acute diastolic (congestive) heart failure: Secondary | ICD-10-CM | POA: Diagnosis not present

## 2024-01-07 DIAGNOSIS — I4891 Unspecified atrial fibrillation: Secondary | ICD-10-CM | POA: Diagnosis not present

## 2024-01-07 DIAGNOSIS — I130 Hypertensive heart and chronic kidney disease with heart failure and stage 1 through stage 4 chronic kidney disease, or unspecified chronic kidney disease: Secondary | ICD-10-CM

## 2024-01-07 DIAGNOSIS — N183 Chronic kidney disease, stage 3 unspecified: Secondary | ICD-10-CM

## 2024-01-07 DIAGNOSIS — I480 Paroxysmal atrial fibrillation: Secondary | ICD-10-CM

## 2024-01-07 HISTORY — PX: CARDIOVERSION: EP1203

## 2024-01-07 SURGERY — CARDIOVERSION (CATH LAB)
Anesthesia: Monitor Anesthesia Care

## 2024-01-07 MED ORDER — PROPOFOL 10 MG/ML IV BOLUS
INTRAVENOUS | Status: DC | PRN
  Administered 2024-01-07: 20 mg via INTRAVENOUS
  Administered 2024-01-07: 60 mg via INTRAVENOUS

## 2024-01-07 MED ORDER — SODIUM CHLORIDE 0.9% FLUSH: 3.0000 mL | INTRAVENOUS | Status: DC | PRN

## 2024-01-07 MED ORDER — LIDOCAINE 2% (20 MG/ML) 5 ML SYRINGE
INTRAMUSCULAR | Status: DC | PRN
  Administered 2024-01-07: 100 mg via INTRAVENOUS

## 2024-01-07 MED ORDER — SODIUM CHLORIDE 0.9 % IV SOLN: 250.0000 mL | INTRAVENOUS | Status: DC | PRN

## 2024-01-07 MED ORDER — SODIUM CHLORIDE 0.9% FLUSH: 3.0000 mL | Freq: Two times a day (BID) | INTRAVENOUS | Status: DC

## 2024-01-07 SURGICAL SUPPLY — 1 items: PAD DEFIB RADIO PHYSIO CONN (PAD) ×1 IMPLANT

## 2024-01-07 NOTE — CV Procedure (Signed)
 Procedure:   DCCV  Indication:  Symptomatic atrial fibrillation  Procedure Note:  The patient signed informed consent.  They have had had therapeutic anticoagulation with apixaban  greater than 3 weeks.  Anesthesia was administered by Dr. Erma.  Patient received 100 mg IV lidocaine and 80 mg IV propofol.Adequate airway was maintained throughout and vital followed per protocol.  They were cardioverted x 1 with 200J of biphasic synchronized energy.  They converted to NSR.  There were no apparent complications.  The patient had normal neuro status and respiratory status post procedure with vitals stable as recorded elsewhere.    Follow up:  They will continue on current medical therapy and follow up with cardiology as scheduled.  Shelda Bruckner, MD PhD 01/07/2024 9:41 AM

## 2024-01-07 NOTE — Interval H&P Note (Signed)
 History and Physical Interval Note:  01/07/2024 9:15 AM  Justin Molina.  has presented today for surgery, with the diagnosis of AFIB.  The various methods of treatment have been discussed with the patient and family. After consideration of risks, benefits and other options for treatment, the patient has consented to  Procedure(s): CARDIOVERSION (N/A) as a surgical intervention.  The patient's history has been reviewed, patient examined, no change in status, stable for surgery.  I have reviewed the patient's chart and labs.  Questions were answered to the patient's satisfaction.     [name]

## 2024-01-07 NOTE — Transfer of Care (Signed)
 Immediate Anesthesia Transfer of Care Note  Patient: Justin Molina.  Procedure(s) Performed: CARDIOVERSION  Patient Location: Cath Lab  Anesthesia Type:General  Level of Consciousness: drowsy and responds to stimulation  Airway & Oxygen Therapy: Patient Spontanous Breathing and Patient connected to nasal cannula oxygen  Post-op Assessment: Report given to RN and Post -op Vital signs reviewed and stable  Post vital signs: Reviewed and stable  Last Vitals:  Vitals Value Taken Time  BP 134/82 01/07/24 09:38  Temp 36.4 C 01/07/24 09:38  Pulse 63 01/07/24 09:38  Resp 19 01/07/24 09:38  SpO2 93 % 01/07/24 09:27  Vitals shown include unfiled device data.  Last Pain:  Vitals:   01/07/24 0938  TempSrc: Tympanic  PainSc: Asleep         Complications: No notable events documented.

## 2024-01-07 NOTE — Discharge Instructions (Signed)

## 2024-01-07 NOTE — Anesthesia Postprocedure Evaluation (Signed)
 Anesthesia Post Note  Patient: Justin Molina.  Procedure(s) Performed: CARDIOVERSION     Patient location during evaluation: PACU Anesthesia Type: General Level of consciousness: awake and alert Pain management: pain level controlled Vital Signs Assessment: post-procedure vital signs reviewed and stable Respiratory status: spontaneous breathing, nonlabored ventilation, respiratory function stable and patient connected to nasal cannula oxygen Cardiovascular status: blood pressure returned to baseline and stable Postop Assessment: no apparent nausea or vomiting Anesthetic complications: no   No notable events documented.  Last Vitals:  Vitals:   01/07/24 0958 01/07/24 1008  BP: 131/80 132/73  Pulse: 60 60  Resp: 18 14  Temp:    SpO2: 98% 98%    Last Pain:  Vitals:   01/07/24 1008  TempSrc:   PainSc: 0-No pain                 [name]

## 2024-01-07 NOTE — Anesthesia Preprocedure Evaluation (Signed)
 Anesthesia Evaluation  Patient identified by MRN, date of birth, ID band Patient awake    Reviewed: Allergy & Precautions, H&P , NPO status , Patient's Chart, lab work & pertinent test results  History of Anesthesia Complications Negative for: history of anesthetic complications  Airway Mallampati: II  TM Distance: >3 FB Neck ROM: Full    Dental no notable dental hx.    Pulmonary sleep apnea    Pulmonary exam normal breath sounds clear to auscultation       Cardiovascular hypertension, (-) angina + CAD and +CHF  Normal cardiovascular exam+ dysrhythmias Atrial Fibrillation  Rhythm:Regular Rate:Normal     Neuro/Psych  Headaches PSYCHIATRIC DISORDERS Anxiety     S/p CVA with right body hemiparesis Some residual aphasia  CVA, Residual Symptoms    GI/Hepatic negative GI ROS, Neg liver ROS,,,  Endo/Other  negative endocrine ROSdiabetes, Type 2    Renal/GU CRFRenal disease  negative genitourinary   Musculoskeletal negative musculoskeletal ROS (+)    Abdominal   Peds negative pediatric ROS (+)  Hematology negative hematology ROS (+)   Anesthesia Other Findings   Reproductive/Obstetrics negative OB ROS                              Anesthesia Physical Anesthesia Plan  ASA: 3  Anesthesia Plan: General   Post-op Pain Management:    Induction: Intravenous  PONV Risk Score and Plan: 2 and Treatment may vary due to age or medical condition  Airway Management Planned: Natural Airway and Simple Face Mask  Additional Equipment: None  Intra-op Plan:   Post-operative Plan: Extubation in OR  Informed Consent: I have reviewed the patients History and Physical, chart, labs and discussed the procedure including the risks, benefits and alternatives for the proposed anesthesia with the patient or authorized representative who has indicated his/her understanding and acceptance.     Dental  advisory given  Plan Discussed with: CRNA  Anesthesia Plan Comments:          Anesthesia Quick Evaluation

## 2024-01-09 ENCOUNTER — Ambulatory Visit: Payer: Self-pay | Admitting: Family Medicine

## 2024-01-14 ENCOUNTER — Telehealth: Payer: Self-pay

## 2024-01-14 NOTE — Telephone Encounter (Signed)
 Received call from [facility] regarding outstanding orders needing signature.   These have been placed in Dr. McDiarmid's box, as they require attending signature.   Please fax back to [facility] once complete.   Thanks.   Chiquita JAYSON English, RN

## 2024-01-20 ENCOUNTER — Telehealth: Payer: Self-pay | Admitting: Cardiology

## 2024-01-20 ENCOUNTER — Encounter: Payer: Self-pay | Admitting: Cardiology

## 2024-01-20 ENCOUNTER — Ambulatory Visit: Attending: Cardiology | Admitting: Cardiology

## 2024-01-20 VITALS — BP 134/76 | HR 73 | Ht 71.0 in | Wt 271.0 lb

## 2024-01-20 DIAGNOSIS — I1A0 Resistant hypertension: Secondary | ICD-10-CM

## 2024-01-20 DIAGNOSIS — I5032 Chronic diastolic (congestive) heart failure: Secondary | ICD-10-CM

## 2024-01-20 DIAGNOSIS — E785 Hyperlipidemia, unspecified: Secondary | ICD-10-CM

## 2024-01-20 DIAGNOSIS — Z79899 Other long term (current) drug therapy: Secondary | ICD-10-CM

## 2024-01-20 DIAGNOSIS — I4819 Other persistent atrial fibrillation: Secondary | ICD-10-CM

## 2024-01-20 NOTE — Progress Notes (Signed)
 Cardiology Office Note:    Date:  01/20/2024   ID:  Justin Catha Raddle., DOB [birth year], MRN [id number]  PCP:  Toma Matas, MD  Cardiologist:  Lonni LITTIE Nanas, MD  Electrophysiologist:  None   Referring MD: Health, [facility]   Chief Complaint  Patient presents with   Atrial Fibrillation         History of Present Illness:    Justin Cornwall. is a 63 y.o. male with a hx of chronic diastolic heart failure, hypertension, CVA, atrial fibrillation, PFO who presents follow-up.  He has longstanding history of resistant hypertension, which has led to complications including multiple CVAs with residual deficits, chronic diastolic heart failure, CKD.  Has had multiple admissions with acute on chronic diastolic heart failure.  Was discharged on 1425 after admission for IV diuresis for acute on chronic diastolic heart failure.  Echocardiogram 08/2023 showed EF 50 to 55%, normal RV function, and no significant valvular disease.  Underwent successful cardioversion for A-fib on 01/07/2024.  Since last clinic visit, reports over the weekend developed URI symptoms, including coughing and sneezing and congestion.  Denies any chest pain or dyspnea.  Weight has been stable.  Taking Eliquis , denies any bleeding.  Wt Readings from Last 3 Encounters:  01/20/24 271 lb (122.9 kg)  01/07/24 275 lb (124.7 kg)  12/31/23 281 lb 6.4 oz (127.6 kg)      Past Medical History:  Diagnosis Date   Chronic renal insufficiency    Diabetes mellitus without complication (HCC)    Hypertension    Stroke [facility])     Past Surgical History:  Procedure Laterality Date   BRAIN SURGERY     CARDIOVERSION N/A 01/07/2024   Procedure: CARDIOVERSION;  Surgeon: Lonni Slain, MD;  Location: [facility] INVASIVE CV LAB;  Service: Cardiovascular;  Laterality: N/A;   GASTROSTOMY TUBE PLACEMENT     HERNIA REPAIR     TRACHEOSTOMY      Current Medications: Current Meds  Medication Sig   acetaminophen   (TYLENOL ) 500 MG tablet Take 1 tablet (500 mg total) by mouth every 6 (six) hours as needed.   albuterol  (VENTOLIN  HFA) 108 (90 Base) MCG/ACT inhaler Inhale 2 puffs into the lungs every 4 (four) hours as needed for wheezing or shortness of breath.   amLODipine  (NORVASC ) 10 MG tablet Take 10 mg by mouth daily.   atorvastatin  (LIPITOR ) 80 MG tablet Take 1 tablet (80 mg total) by mouth at bedtime.   carvedilol  (COREG ) 12.5 MG tablet Take 1 tablet (12.5 mg total) by mouth 2 (two) times daily with a meal.   diltiazem  (CARDIZEM  CD) 300 MG 24 hr capsule Take 1 capsule (300 mg total) by mouth daily.   doxazosin  (CARDURA ) 4 MG tablet Take 4 mg by mouth at bedtime.   ELIQUIS  5 MG TABS tablet Take 1 tablet (5 mg total) by mouth 2 (two) times daily.   empagliflozin  (JARDIANCE ) 10 MG TABS tablet Take 1 tablet (10 mg total) by mouth daily.   fluticasone (FLONASE) 50 MCG/ACT nasal spray Place 2 sprays into both nostrils daily as needed for allergies or rhinitis.   glucose blood (ACCU-CHEK AVIVA PLUS) test strip Use as instructed for TID and QHS blood glucose testing   isosorbide  mononitrate (IMDUR ) 120 MG 24 hr tablet Take 1 tablet (120 mg total) by mouth daily.   Lancets 28G MISC Check blood sugar TID & QHS   loratadine  (CLARITIN ) 10 MG tablet Take 10 mg by mouth daily as needed for allergies or rhinitis.  losartan  (COZAAR ) 25 MG tablet Take 25 mg by mouth daily.   nystatin cream (MYCOSTATIN) Apply 1 Application topically daily as needed (yeast).   OZEMPIC, 0.25 OR 0.5 MG/DOSE, 2 MG/3ML SOPN Inject 0.25 mg into the skin every Sunday.   potassium chloride  SA (KLOR-CON  M) 20 MEQ tablet Take 1 tablet (20 mEq total) by mouth daily.   torsemide  (DEMADEX ) 20 MG tablet Take 2 tablets (40 mg total) by mouth daily. Can take an additional tablet prn shortness of breath     Allergies:   Contrast media [iodinated contrast media] and Iodine   Social History   Socioeconomic History   Marital status: Married     Spouse name: Not on file   Number of children: Not on file   Years of education: Not on file   Highest education level: Not on file  Occupational History   Not on file  Tobacco Use   Smoking status: Never   Smokeless tobacco: Never  Vaping Use   Vaping status: Never Used  Substance and Sexual Activity   Alcohol  use: No   Drug use: No   Sexual activity: Not on file  Other Topics Concern   Not on file  Social History Narrative   Not on file   Social Drivers of Health   Tobacco Use: Low Risk (01/20/2024)   Patient History    Smoking Tobacco Use: Never    Smokeless Tobacco Use: Never    Passive Exposure: Not on file  Financial Resource Strain: Not on file  Food Insecurity: Food Insecurity Present (12/19/2023)   Epic    Worried About Programme Researcher, Broadcasting/film/video in the Last Year: Sometimes true    [employer] of Food in the Last Year: Never true  Transportation Needs: No Transportation Needs (12/19/2023)   Epic    Lack of Transportation (Medical): No    Lack of Transportation (Non-Medical): No  Physical Activity: Not on file  Stress: Not on file  Social Connections: Not on file  Depression (EYV7-0): Not on file  Alcohol  Screen: Not on file  Housing: Low Risk (12/19/2023)   Epic    Unable to Pay for Housing in the Last Year: No    Number of Times Moved in the Last Year: 1    Homeless in the Last Year: No  Utilities: At Risk (12/19/2023)   Epic    Threatened with loss of utilities: Yes  Health Literacy: Not on file     Family History: The patient's family history includes Anuerysm in his father.  ROS:   Please see the history of present illness.     All other systems reviewed and are negative.  EKGs/Labs/Other Studies Reviewed:    The following studies were reviewed today:   EKG:   12/23/2023: Atrial fibrillation, rate 89, left axis deviation 01/20/2024: Atrial fibrillation, rate 82, right axis deviation  Recent Labs: 04/09/2023: TSH 0.831 05/15/2023: NT-Pro BNP  3,546 12/18/2023: ALT 10; B Natriuretic Peptide 441.5 01/01/2024: BUN 20; Creatinine, Ser 1.69; Hemoglobin 16.7; Magnesium  2.3; Platelets 153; Potassium 4.0; Sodium 143  Recent Lipid Panel No results found for: CHOL, TRIG, HDL, CHOLHDL, VLDL, LDLCALC, LDLDIRECT  Physical Exam:    VS:  BP 134/76   Pulse 73   Ht 5' 11 (1.803 m)   Wt 271 lb (122.9 kg)   SpO2 94%   BMI 37.80 kg/m     Wt Readings from Last 3 Encounters:  01/20/24 271 lb (122.9 kg)  01/07/24 275 lb (124.7 kg)  12/31/23 281 lb 6.4 oz (127.6 kg)     GEN:  Well nourished, well developed in no acute distress HEENT: Normal NECK: No JVD; No carotid bruits LYMPHATICS: No lymphadenopathy CARDIAC: irregular, normal rate,  no murmurs, rubs, gallops RESPIRATORY:  Clear to auscultation without rales, wheezing or rhonchi  ABDOMEN: Soft, non-tender, non-distended MUSCULOSKELETAL:  No edema; No deformity  SKIN: Warm and dry NEUROLOGIC:  Alert and oriented x 3 PSYCHIATRIC:  Normal affect   ASSESSMENT:    1. Persistent atrial fibrillation (HCC)   2. Medication management   3. Resistant hypertension   4. Chronic heart failure with preserved ejection fraction (HCC)   5. Hyperlipidemia, unspecified hyperlipidemia type     PLAN:    Chronic diastolic heart failure: Echocardiogram 08/2023 showed EF 50 to 55%, normal RV function, and no significant valvular disease.  Admitted 12/2023 with acute on chronic diastolic heart failure, treated with IV diuresis - Continue torsemide  40 mg daily.  Appears euvolemic.  Check CMET, magnesium , BNP - Continue Jardiance   Persistent atrial fibrillation: Continue Eliquis  5 mg twice daily.  Was in RVR during admission 12/2023, his rate control was titrated.  Continue diltiazem  300 mg daily and carvedilol  12.5 mg daily, appears rate controlled.  Possible that A-fib is contributing to his diastolic heart failure.  Underwent successful cardioversion for A-fib on 01/07/2024 but now back in  Afib in clinic today.  Will refer to A-fib clinic  Resistant hypertension: Continue losartan  25 mg daily, Imdur  120 mg daily, amlodipine  10 mg daily, carvedilol  12.5 mg twice daily, diltiazem  300 mg daily.  Appears controlled  Hyperlipidemia: Continue atorvastatin  80 mg daily  T2DM: On Jardiance  and Ozempic  RTC in 3 months   Medication Adjustments/Labs and Tests Ordered: Current medicines are reviewed at length with the patient today.  Concerns regarding medicines are outlined above.  Orders Placed This Encounter  Procedures   Comp Met (CMET)   Magnesium    CBC   B Nat Peptide   Amb Referral to AFIB Clinic   EKG 12-Lead   No orders of the defined types were placed in this encounter.   Patient Instructions  Medication Instructions:  Your physician recommends that you continue on your current medications as directed. Please refer to the Current Medication list given to you today.  *If you need a refill on your cardiac medications before your next appointment, please call your pharmacy*  Lab Work: CMET, Mag, CBC, BNP If you have labs (blood work) drawn today and your tests are completely normal, you will receive your results only by: MyChart Message (if you have MyChart) OR A paper copy in the mail If you have any lab test that is abnormal or we need to change your treatment, we will call you to review the results.  Follow-Up: At [facility], you and your health needs are our priority.  As part of our continuing mission to provide you with exceptional heart care, our providers are all part of one team.  This team includes your primary Cardiologist (physician) and Advanced Practice Providers or APPs (Physician Assistants and Nurse Practitioners) who all work together to provide you with the care you need, when you need it.  Your next appointment:   3 month(s)  Provider:   Lonni LITTIE Nanas, MD     Signed, Lonni LITTIE Nanas, MD  01/20/2024 5:40 PM     [facility]

## 2024-01-20 NOTE — Patient Instructions (Signed)
 Medication Instructions:  Your physician recommends that you continue on your current medications as directed. Please refer to the Current Medication list given to you today.  *If you need a refill on your cardiac medications before your next appointment, please call your pharmacy*  Lab Work: CMET, Mag, CBC, BNP If you have labs (blood work) drawn today and your tests are completely normal, you will receive your results only by: MyChart Message (if you have MyChart) OR A paper copy in the mail If you have any lab test that is abnormal or we need to change your treatment, we will call you to review the results.  Follow-Up: At [facility], you and your health needs are our priority.  As part of our continuing mission to provide you with exceptional heart care, our providers are all part of one team.  This team includes your primary Cardiologist (physician) and Advanced Practice Providers or APPs (Physician Assistants and Nurse Practitioners) who all work together to provide you with the care you need, when you need it.  Your next appointment:   3 month(s)  Provider:   Lonni LITTIE Nanas, MD

## 2024-01-20 NOTE — Telephone Encounter (Signed)
 Pt needs a 43m fu with Kate. Pt preference: late afternoon/evening times. Thank you.

## 2024-01-21 ENCOUNTER — Ambulatory Visit: Payer: Self-pay | Admitting: Cardiology

## 2024-01-21 ENCOUNTER — Ambulatory Visit: Payer: Self-pay | Admitting: Family Medicine

## 2024-01-21 DIAGNOSIS — I1A0 Resistant hypertension: Secondary | ICD-10-CM

## 2024-01-21 DIAGNOSIS — I5032 Chronic diastolic (congestive) heart failure: Secondary | ICD-10-CM

## 2024-01-21 DIAGNOSIS — I4819 Other persistent atrial fibrillation: Secondary | ICD-10-CM

## 2024-01-21 DIAGNOSIS — Z79899 Other long term (current) drug therapy: Secondary | ICD-10-CM

## 2024-01-22 ENCOUNTER — Ambulatory Visit: Admitting: Family Medicine

## 2024-01-22 LAB — COMPREHENSIVE METABOLIC PANEL WITH GFR
ALT: 15 IU/L (ref 0–44)
AST: 34 IU/L (ref 0–40)
Albumin: 4.3 g/dL (ref 3.9–4.9)
Alkaline Phosphatase: 100 IU/L (ref 47–123)
BUN/Creatinine Ratio: 10 (ref 10–24)
BUN: 22 mg/dL (ref 8–27)
Bilirubin Total: 0.7 mg/dL (ref 0.0–1.2)
CO2: 22 mmol/L (ref 20–29)
Calcium: 8.5 mg/dL — ABNORMAL LOW (ref 8.6–10.2)
Creatinine, Ser: 2.13 mg/dL — ABNORMAL HIGH (ref 0.76–1.27)
Globulin, Total: 3.7 g/dL (ref 1.5–4.5)
Glucose: 128 mg/dL — ABNORMAL HIGH (ref 70–99)
Potassium: 3.5 mmol/L (ref 3.5–5.2)
Sodium: 145 mmol/L — ABNORMAL HIGH (ref 134–144)
Total Protein: 8 g/dL (ref 6.0–8.5)
eGFR: 34 mL/min/1.73 — ABNORMAL LOW (ref >59)

## 2024-01-22 LAB — CBC
Hematocrit: 49.5 % (ref 37.5–51.0)
Hemoglobin: 17.3 g/dL (ref 13.0–17.7)
MCH: 28.6 pg (ref 26.6–33.0)
MCHC: 34.9 g/dL (ref 31.5–35.7)
MCV: 82 fL (ref 79–97)
Platelets: 123 x10E3/uL — ABNORMAL LOW (ref 150–450)
RBC: 6.05 x10E6/uL — ABNORMAL HIGH (ref 4.14–5.80)
RDW: 16.5 % — ABNORMAL HIGH (ref 11.6–15.4)
WBC: 3.8 x10E3/uL (ref 3.4–10.8)

## 2024-01-22 LAB — MAGNESIUM: Magnesium: 2.3 mg/dL (ref 1.6–2.3)

## 2024-01-22 LAB — BRAIN NATRIURETIC PEPTIDE: BNP: 186.1 pg/mL — ABNORMAL HIGH (ref 0.0–100.0)

## 2024-01-22 NOTE — Progress Notes (Incomplete)
 Assessment  Assessment/Plan:  Assessment and Plan              There are no discontinued medications.  Patient Counseling(The following topics were reviewed and/or handout was given):  -Nutrition: Stressed importance of moderation in sodium/caffeine intake, saturated fat and cholesterol, caloric balance, sufficient intake of fresh fruits, vegetables, and fiber.  -Stressed the importance of regular exercise.   -Substance Abuse: Discussed cessation/primary prevention of tobacco, alcohol , or other drug use; driving or other dangerous activities under the influence; availability of treatment for abuse.   -Injury prevention: Discussed safety belts, safety helmets, smoke detector, smoking near bedding or upholstery.   -Sexuality: Discussed sexually transmitted diseases, partner selection, use of condoms, avoidance of unintended pregnancy and contraceptive alternatives.   -Dental health: Discussed importance of regular tooth brushing, flossing, and dental visits.  -Health maintenance and immunizations reviewed. Please refer to Health maintenance section.  No follow-ups on file.        Subjective:   Encounter date: 01/22/2024  No chief complaint on file.   Discussed the use of AI scribe software for clinical note transcription with the patient, who gave verbal consent to proceed.  History of Present Illness           Diabetes Mellitus, type II - Managed with Jardiance  10 mg daily and Ozempic 0.25 mg weekly. - Last hgbA1c 9.4% in October 2025. - Due for eye exam. - Followed by wound care clinic for ulcer of right lower leg.    Hypertension - Managed with Amlodipine  10 mg daily, Carvedilol  12.5 mg BID, Cardizem  300 mg daily, Imdur  120 mg daily, Losartan  25 mg daily, and Torsemide  40 mg daily.   Chronic Kidney Disease  - Takes Jardiance  10 mg daily.  - Last Creatinine 2.13 mg/dL in December.    Hyperlipidemia - Managed with Atorvastatin  80 mg daily  Obesity  -  Managed with Ozempic 0.25 mg weekly. - Last weight in December was 271 LBS.  Anxiety - Not currently managed with medication.   Atrial fibrillation - Managed with Carvedilol  12.5 mg BID, Cardizem  300 mg daily, and Elqiuis 5 mg BID for anticoagulation. - Status post cardioversion 01/07/2204.  - Followed by Dr. Kate, Cardiologist.    Chronic Heart Failure  - Managed with Torsemide  40 mg daily and Jardiance  10 mg daily.  - Echocardiogram in July 2025 showed EF 50 to 55%, normal RV function, and no significant valvular disease. - Takes Potassium supplement for hypokalemia.     History of CVA  - CVA occurred in 2009. 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 -   Benign Prostatic Hyperplasia - Not currently managed with medication.   Hyperparathyroidism  - Not currently managed with medication.  - Last TSH normal at 0.831 in March 2025      08/12/2014    6:11 PM 06/11/2014   11:51 AM  Depression screen PHQ 2/9  Decreased Interest 1 0  Down, Depressed, Hopeless 0 0  PHQ - 2 Score 1 0  Altered sleeping 3   Tired, decreased energy 3   Change in appetite 0   Feeling bad or failure about yourself  3   Trouble concentrating 1   Moving slowly or fidgety/restless 2   Suicidal thoughts 0    PHQ-9 Score 13       Data saved with a previous flowsheet row definition        No data to display  Health Maintenance Due  Topic Date Due   Medicare Annual Wellness (AWV)  Never done   COVID-19 Vaccine (1) Never done   OPHTHALMOLOGY EXAM  Never done   Diabetic kidney evaluation - Urine ACR  Never done   Hepatitis C Screening  Never done   DTaP/Tdap/Td (1 - Tdap) Never done   Pneumococcal Vaccine: 50+ Years (1 of 2 - PCV) Never done   Zoster Vaccines- Shingrix (1 of 2) Never done   Colonoscopy  Never done   FOOT EXAM  06/11/2015   Influenza Vaccine  Never done     Dental: ***UTD Vision: ***UTD  PMH:  The following  were reviewed and entered/updated in epic: Past Medical History:  Diagnosis Date   Chronic renal insufficiency    Diabetes mellitus without complication (HCC)    Hypertension    Stroke [facility])     Patient Active Problem List   Diagnosis Date Noted   Sinus pause 12/20/2023   Bilateral pleural effusion 12/19/2023   Ulcer of extremity due to chronic venous insufficiency (HCC) 12/18/2023   Type 2 diabetes mellitus (HCC) 12/18/2023   Paroxysmal A-fib (HCC) 12/01/2023   Atrial fibrillation with RVR (HCC) 12/01/2023   Hyperosmolar hyperglycemic state (HHS) (HCC) 12/01/2023   Chronic health problem 12/01/2023   Hypernatremia 12/01/2023   Essential hypertension 04/12/2023   Persistent atrial fibrillation (HCC) 04/11/2023   Acute on chronic diastolic heart failure (HCC) 04/09/2023   Class 2 obesity 03/19/2023   Open wound of right lower extremity 03/19/2023   Migraine headaches 03/19/2023   Acute on chronic diastolic CHF (congestive heart failure) (HCC) 03/18/2023   Sepsis (HCC) 01/17/2023   Cellulitis 01/17/2023   Hypokalemia 12/27/2022   Chronic heart failure with preserved ejection fraction (HFpEF, >= 50%) (HCC) 12/12/2022   Benign prostatic hyperplasia 01/05/2022   Coronary atherosclerosis 12/14/2021   Constipation 12/14/2021   Unspecified dementia, unspecified severity, without behavioral disturbance, psychotic disturbance, mood disturbance, and anxiety (HCC) 05/01/2021   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   Hemiplegia and hemiparesis following cerebral infarction affecting right dominant side (HCC) 01/04/2021   Aphasia following cerebral infarction 01/04/2021   Atrial fibrillation, chronic (HCC) 01/04/2021   Class 3 severe obesity due to excess calories with body mass index (BMI) of 45.0 to 49.9 in adult (HCC) 10/03/2017   Aphasia 07/02/2017   OSA (obstructive sleep apnea) 01/16/2016   CKD (chronic kidney disease) stage 3, GFR 30-59 ml/min (HCC) 08/24/2014    HLD (hyperlipidemia) 08/24/2014   Anxiety 08/24/2014   Type 2 diabetes mellitus with hyperlipidemia (HCC) 08/12/2014   Diabetic neuropathy (HCC) 08/12/2014   HTN (hypertension) 08/12/2014   History of CVA with residual deficit 08/12/2014    Past Surgical History:  Procedure Laterality Date   BRAIN SURGERY     CARDIOVERSION N/A 01/07/2024   Procedure: CARDIOVERSION;  Surgeon: Lonni Slain, MD;  Location: [facility] INVASIVE CV LAB;  Service: Cardiovascular;  Laterality: N/A;   GASTROSTOMY TUBE PLACEMENT     HERNIA REPAIR     TRACHEOSTOMY      Family History  Problem Relation Age of Onset   Anuerysm Father        Brain    Medications- reviewed and updated Outpatient Medications Prior to Visit  Medication Sig Dispense Refill   acetaminophen  (TYLENOL ) 500 MG tablet Take 1 tablet (500 mg total) by mouth every 6 (six) hours as needed. 30 tablet 0   albuterol  (VENTOLIN  HFA) 108 (90 Base) MCG/ACT inhaler Inhale 2 puffs into the lungs every  4 (four) hours as needed for wheezing or shortness of breath.     amLODipine  (NORVASC ) 10 MG tablet Take 10 mg by mouth daily.     atorvastatin  (LIPITOR ) 80 MG tablet Take 1 tablet (80 mg total) by mouth at bedtime. 30 tablet 0   carvedilol  (COREG ) 12.5 MG tablet Take 1 tablet (12.5 mg total) by mouth 2 (two) times daily with a meal. 60 tablet 0   diltiazem  (CARDIZEM  CD) 300 MG 24 hr capsule Take 1 capsule (300 mg total) by mouth daily. 30 capsule 3   doxazosin  (CARDURA ) 4 MG tablet Take 4 mg by mouth at bedtime.     ELIQUIS  5 MG TABS tablet Take 1 tablet (5 mg total) by mouth 2 (two) times daily. 180 tablet 0   empagliflozin  (JARDIANCE ) 10 MG TABS tablet Take 1 tablet (10 mg total) by mouth daily. 30 tablet 0   fluticasone (FLONASE) 50 MCG/ACT nasal spray Place 2 sprays into both nostrils daily as needed for allergies or rhinitis.     glucose blood (ACCU-CHEK AVIVA PLUS) test strip Use as instructed for TID and QHS blood glucose testing 100 each 12    isosorbide  mononitrate (IMDUR ) 120 MG 24 hr tablet Take 1 tablet (120 mg total) by mouth daily. 30 tablet 0   Lancets 28G MISC Check blood sugar TID & QHS 100 each 12   loratadine  (CLARITIN ) 10 MG tablet Take 10 mg by mouth daily as needed for allergies or rhinitis.     losartan  (COZAAR ) 25 MG tablet Take 25 mg by mouth daily.     nystatin cream (MYCOSTATIN) Apply 1 Application topically daily as needed (yeast).     OZEMPIC, 0.25 OR 0.5 MG/DOSE, 2 MG/3ML SOPN Inject 0.25 mg into the skin every Sunday.     potassium chloride  SA (KLOR-CON  M) 20 MEQ tablet Take 1 tablet (20 mEq total) by mouth daily.     torsemide  (DEMADEX ) 20 MG tablet Take 2 tablets (40 mg total) by mouth daily. Can take an additional tablet prn shortness of breath 60 tablet 0   No facility-administered medications prior to visit.     Allergies[1]  Social History   Socioeconomic History   Marital status: Married    Spouse name: Not on file   Number of children: Not on file   Years of education: Not on file   Highest education level: Not on file  Occupational History   Not on file  Tobacco Use   Smoking status: Never   Smokeless tobacco: Never  Vaping Use   Vaping status: Never Used  Substance and Sexual Activity   Alcohol  use: No   Drug use: No   Sexual activity: Not on file  Other Topics Concern   Not on file  Social History Narrative   Not on file   Social Drivers of Health   Tobacco Use: Low Risk (01/20/2024)   Patient History    Smoking Tobacco Use: Never    Smokeless Tobacco Use: Never    Passive Exposure: Not on file  Financial Resource Strain: Not on file  Food Insecurity: Food Insecurity Present (12/19/2023)   Epic    Worried About Programme Researcher, Broadcasting/film/video in the Last Year: Sometimes true    [employer] of Food in the Last Year: Never true  Transportation Needs: No Transportation Needs (12/19/2023)   Epic    Lack of Transportation (Medical): No    Lack of Transportation (Non-Medical): No   Physical Activity: Not on file  Stress: Not on file  Social Connections: Not on file  Depression (EYV7-0): Not on file  Alcohol  Screen: Not on file  Housing: Low Risk (12/19/2023)   Epic    Unable to Pay for Housing in the Last Year: No    Number of Times Moved in the Last Year: 1    Homeless in the Last Year: No  Utilities: At Risk (12/19/2023)   Epic    Threatened with loss of utilities: Yes  Health Literacy: Not on file           Objective:  Physical Exam: There were no vitals taken for this visit.  There is no height or weight on file to calculate BMI. Wt Readings from Last 3 Encounters:  01/20/24 271 lb (122.9 kg)  01/07/24 275 lb (124.7 kg)  12/31/23 281 lb 6.4 oz (127.6 kg)    Physical Exam          Physical Exam      Prior labs:   Recent Results (from the past 2160 hours)  Resp panel by RT-PCR (RSV, Flu A&B, Covid) Anterior Nasal Swab     Status: None   Collection Time: 12/01/23 11:05 AM   Specimen: Anterior Nasal Swab  Result Value Ref Range   SARS Coronavirus 2 by RT PCR NEGATIVE NEGATIVE   Influenza A by PCR NEGATIVE NEGATIVE   Influenza B by PCR NEGATIVE NEGATIVE    Comment: (NOTE) The Xpert Xpress SARS-CoV-2/FLU/RSV plus assay is intended as an aid in the diagnosis of influenza from Nasopharyngeal swab specimens and should not be used as a sole basis for treatment. Nasal washings and aspirates are unacceptable for Xpert Xpress SARS-CoV-2/FLU/RSV testing.  Fact Sheet for Patients: bloggercourse.com  Fact Sheet for Healthcare Providers: seriousbroker.it  This test is not yet approved or cleared by the [place]  FDA and has been authorized for detection and/or diagnosis of SARS-CoV-2 by FDA under an Emergency Use Authorization (EUA). This EUA will remain in effect (meaning this test can be used) for the duration of the COVID-19 declaration under Section 564(b)(1) of the Act, 21  U.S.C. section 360bbb-3(b)(1), unless the authorization is terminated or revoked.     Resp Syncytial Virus by PCR NEGATIVE NEGATIVE    Comment: (NOTE) Fact Sheet for Patients: bloggercourse.com  Fact Sheet for Healthcare Providers: seriousbroker.it  This test is not yet approved or cleared by the [place]  FDA and has been authorized for detection and/or diagnosis of SARS-CoV-2 by FDA under an Emergency Use Authorization (EUA). This EUA will remain in effect (meaning this test can be used) for the duration of the COVID-19 declaration under Section 564(b)(1) of the Act, 21 U.S.C. section 360bbb-3(b)(1), unless the authorization is terminated or revoked.  Performed at [facility], [address] [place], [place] [address]   I-Stat venous blood gas, ED     Status: Abnormal   Collection Time: 12/01/23 11:40 AM  Result Value Ref Range   pH, Ven 7.401 7.25 - 7.43   pCO2, Ven 53.4 44 - 60 mmHg   pO2, Ven 31 (LL) 32 - 45 mmHg   Bicarbonate 33.2 (H) 20.0 - 28.0 mmol/L   TCO2 35 (H) 22 - 32 mmol/L   O2 Saturation 57 %   Acid-Base Excess 6.0 (H) 0.0 - 2.0 mmol/L   Sodium 153 (H) 135 - 145 mmol/L   Potassium 3.9 3.5 - 5.1 mmol/L   Calcium , Ion 1.14 (L) 1.15 - 1.40 mmol/L   HCT 55.0 (H) 39.0 -  52.0 %   Hemoglobin 18.7 (H) 13.0 - 17.0 g/dL   Sample type VENOUS    Comment NOTIFIED PHYSICIAN   I-stat chem 8, ED     Status: Abnormal   Collection Time: 12/01/23 11:40 AM  Result Value Ref Range   Sodium 154 (H) 135 - 145 mmol/L   Potassium 3.9 3.5 - 5.1 mmol/L   Chloride 109 98 - 111 mmol/L   BUN 50 (H) 8 - 23 mg/dL   Creatinine, Ser 7.19 (H) 0.61 - 1.24 mg/dL   Glucose, Bld 394 (HH) 70 - 99 mg/dL    Comment: Glucose reference range applies only to samples taken after fasting for at least 8 hours.   Calcium , Ion 1.13 (L) 1.15 - 1.40 mmol/L   TCO2 30 22 - 32 mmol/L   Hemoglobin 18.7 (H) 13.0 - 17.0 g/dL   HCT 44.9 (H) 60.9  - 52.0 %   Comment NOTIFIED PHYSICIAN   I-Stat Lactic Acid     Status: None   Collection Time: 12/01/23 11:40 AM  Result Value Ref Range   Lactic Acid, Venous 1.6 0.5 - 1.9 mmol/L  CBC with Differential     Status: Abnormal   Collection Time: 12/01/23 11:48 AM  Result Value Ref Range   WBC 5.5 4.0 - 10.5 K/uL   RBC 6.80 (H) 4.22 - 5.81 MIL/uL   Hemoglobin 18.6 (H) 13.0 - 17.0 g/dL   HCT 44.0 (H) 60.9 - 47.9 %    Comment: REPEATED TO VERIFY   MCV 82.2 80.0 - 100.0 fL   MCH 27.4 26.0 - 34.0 pg   MCHC 33.3 30.0 - 36.0 g/dL   RDW 83.5 (H) 88.4 - 84.4 %   Platelets 136 (L) 150 - 400 K/uL   nRBC 0.0 0.0 - 0.2 %   Neutrophils Relative % 70 %   Neutro Abs 3.9 1.7 - 7.7 K/uL   Lymphocytes Relative 21 %   Lymphs Abs 1.1 0.7 - 4.0 K/uL   Monocytes Relative 7 %   Monocytes Absolute 0.4 0.1 - 1.0 K/uL   Eosinophils Relative 1 %   Eosinophils Absolute 0.0 0.0 - 0.5 K/uL   Basophils Relative 1 %   Basophils Absolute 0.0 0.0 - 0.1 K/uL   Immature Granulocytes 0 %   Abs Immature Granulocytes 0.01 0.00 - 0.07 K/uL    Comment: Performed at [facility], [address], [place], [place] [address]  Comprehensive metabolic panel     Status: Abnormal   Collection Time: 12/01/23 11:48 AM  Result Value Ref Range   Sodium 149 (H) 135 - 145 mmol/L   Potassium 4.2 3.5 - 5.1 mmol/L   Chloride 107 98 - 111 mmol/L   CO2 27 22 - 32 mmol/L   Glucose, Bld 606 (HH) 70 - 99 mg/dL    Comment: CRITICAL RESULT CALLED TO, READ BACK BY AND VERIFIED WITH Z.CATES RN @1301  10.26.2025 E.AHMED Glucose reference range applies only to samples taken after fasting for at least 8 hours.    BUN 47 (H) 8 - 23 mg/dL   Creatinine, Ser 6.99 (H) 0.61 - 1.24 mg/dL   Calcium  9.3 8.9 - 10.3 mg/dL   Total Protein 8.3 (H) 6.5 - 8.1 g/dL   Albumin 3.8 3.5 - 5.0 g/dL   AST 23 15 - 41 U/L   ALT 18 0 - 44 U/L   Alkaline Phosphatase 98 38 - 126 U/L   Total Bilirubin 1.1 0.0 - 1.2 mg/dL   GFR, Estimated  23 (L) >60 mL/min     Comment: (NOTE) Calculated using the CKD-EPI Creatinine Equation (2021)    Anion gap 15 5 - 15    Comment: Performed at [facility], [address] [place], [place] [address]  Troponin I (High Sensitivity)     Status: Abnormal   Collection Time: 12/01/23 11:48 AM  Result Value Ref Range   Troponin I (High Sensitivity) 58 (H) <18 ng/L    Comment: (NOTE) Elevated high sensitivity troponin I (hsTnI) values and significant  changes across serial measurements may suggest ACS but many other  chronic and acute conditions are known to elevate hsTnI results.  Refer to the Links section for chest pain algorithms and additional  guidance. Performed at [facility], [address] [place], [place] [address]   CK     Status: None   Collection Time: 12/01/23 11:48 AM  Result Value Ref Range   Total CK 187 49 - 397 U/L    Comment: Performed at [facility], [address] [place], [place] [address]  Urinalysis, w/ Reflex to Culture (Infection Suspected) -Urine, Clean Catch     Status: Abnormal   Collection Time: 12/01/23 12:04 PM  Result Value Ref Range   Specimen Source URINE, CLEAN CATCH    Color, Urine STRAW (A) YELLOW   APPearance CLEAR CLEAR   Specific Gravity, Urine 1.024 1.005 - 1.030   pH 6.0 5.0 - 8.0   Glucose, UA >=500 (A) NEGATIVE mg/dL   Hgb urine dipstick MODERATE (A) NEGATIVE   Bilirubin Urine NEGATIVE NEGATIVE   Ketones, ur 5 (A) NEGATIVE mg/dL   Protein, ur 899 (A) NEGATIVE mg/dL   Nitrite NEGATIVE NEGATIVE   Leukocytes,Ua NEGATIVE NEGATIVE   RBC / HPF 6-10 0 - 5 RBC/hpf   WBC, UA 0-5 0 - 5 WBC/hpf    Comment:        Reflex urine culture not performed if WBC <=10, OR if Squamous epithelial cells >5. If Squamous epithelial cells >5 suggest recollection.    Bacteria, UA NONE SEEN NONE SEEN   Squamous Epithelial / HPF 0-5 0 - 5 /HPF    Comment: Performed at [facility], [address] [place], [place] [address]  Protime-INR     Status:  Abnormal   Collection Time: 12/01/23  1:00 PM  Result Value Ref Range   Prothrombin Time 15.6 (H) 11.4 - 15.2 seconds   INR 1.2 0.8 - 1.2    Comment: (NOTE) INR goal varies based on device and disease states. Performed at [facility], [address] [place], [place] [address]   I-Stat Lactic Acid     Status: None   Collection Time: 12/01/23  1:32 PM  Result Value Ref Range   Lactic Acid, Venous 1.5 0.5 - 1.9 mmol/L  Troponin I (High Sensitivity)     Status: Abnormal   Collection Time: 12/01/23  1:41 PM  Result Value Ref Range   Troponin I (High Sensitivity) 59 (H) <18 ng/L    Comment: (NOTE) Elevated high sensitivity troponin I (hsTnI) values and significant  changes across serial measurements may suggest ACS but many other  chronic and acute conditions are known to elevate hsTnI results.  Refer to the Links section for chest pain algorithms and additional  guidance. Performed at [facility], [address] [place], [place] [address]   CBG monitoring, ED     Status: Abnormal   Collection Time: 12/01/23  3:11 PM  Result Value Ref Range   Glucose-Capillary 423 (H) 70 - 99 mg/dL    Comment: Glucose reference range applies only to samples taken after fasting for at least 8 hours.  CBG monitoring, ED     Status: Abnormal   Collection Time: 12/01/23  4:37 PM  Result Value Ref Range   Glucose-Capillary 345 (H) 70 - 99 mg/dL    Comment: Glucose reference range applies only to samples taken after fasting for at least 8 hours.  CBG monitoring, ED     Status: Abnormal   Collection Time: 12/01/23  5:35 PM  Result Value Ref Range   Glucose-Capillary 188 (H) 70 - 99 mg/dL    Comment: Glucose reference range applies only to samples taken after fasting for at least 8 hours.  Basic metabolic panel     Status: Abnormal   Collection Time: 12/01/23  5:41 PM  Result Value Ref Range   Sodium 157 (H) 135 - 145 mmol/L    Comment: DELTA CHECK NOTED   Potassium 3.4 (L) 3.5 -  5.1 mmol/L   Chloride 117 (H) 98 - 111 mmol/L   CO2 26 22 - 32 mmol/L   Glucose, Bld 148 (H) 70 - 99 mg/dL    Comment: Glucose reference range applies only to samples taken after fasting for at least 8 hours.   BUN 45 (H) 8 - 23 mg/dL   Creatinine, Ser 7.23 (H) 0.61 - 1.24 mg/dL   Calcium  9.3 8.9 - 10.3 mg/dL   GFR, Estimated 25 (L) >60 mL/min    Comment: (NOTE) Calculated using the CKD-EPI Creatinine Equation (2021)    Anion gap 14 5 - 15    Comment: Performed at [facility], [address], [place], [place] [address]  Osmolality     Status: Abnormal   Collection Time: 12/01/23  5:41 PM  Result Value Ref Range   Osmolality 347 (HH) 275 - 295 mOsm/kg    Comment: REPEATED TO VERIFY CRITICAL RESULT CALLED TO, READ BACK BY AND VERIFIED WITH: CLAUDENE SAUNDERS RN CHARGE AT 2009 12/01/2023 MITCHELL,L Performed at [facility], [address], [place], [place] [address]   Hemoglobin A1c     Status: Abnormal   Collection Time: 12/01/23  5:41 PM  Result Value Ref Range   Hgb A1c MFr Bld 9.4 (H) 4.8 - 5.6 %    Comment: (NOTE)         Prediabetes: 5.7 - 6.4         Diabetes: >6.4         Glycemic control for adults with diabetes: <7.0    Mean Plasma Glucose 223 mg/dL    Comment: (NOTE) Performed At: [facility] [address] [place], [place] [id number] Jennette Shorter MD Ey:[id number]   Brain natriuretic peptide     Status: Abnormal   Collection Time: 12/01/23  5:41 PM  Result Value Ref Range   B Natriuretic Peptide 165.5 (H) 0.0 - 100.0 pg/mL    Comment: Performed at [facility], [address] [place], [place] [address]  CBG monitoring, ED     Status: Abnormal   Collection Time: 12/01/23  6:42 PM  Result Value Ref Range   Glucose-Capillary 125 (H) 70 - 99 mg/dL    Comment: Glucose reference range applies only to samples taken after fasting for at least 8 hours.  CBG monitoring, ED     Status: Abnormal   Collection Time: 12/01/23  7:43 PM  Result Value  Ref Range   Glucose-Capillary 156 (H) 70 - 99 mg/dL    Comment: Glucose reference range applies only to samples taken after fasting for at least 8 hours.  CBG monitoring, ED     Status: Abnormal   Collection Time: 12/01/23  8:45 PM  Result Value Ref Range   Glucose-Capillary 140 (H) 70 - 99 mg/dL    Comment: Glucose reference range applies only to samples taken after fasting for at least 8 hours.  Glucose, capillary     Status: Abnormal   Collection Time: 12/01/23  9:48 PM  Result Value Ref Range   Glucose-Capillary 211 (H) 70 - 99 mg/dL    Comment: Glucose reference range applies only to samples taken after fasting for at least 8 hours.  Basic metabolic panel     Status: Abnormal   Collection Time: 12/01/23 10:41 PM  Result Value Ref Range   Sodium 156 (H) 135 - 145 mmol/L   Potassium 3.8 3.5 - 5.1 mmol/L   Chloride 112 (H) 98 - 111 mmol/L   CO2 28 22 - 32 mmol/L   Glucose, Bld 206 (H) 70 - 99 mg/dL    Comment: Glucose reference range applies only to samples taken after fasting for at least 8 hours.   BUN 45 (H) 8 - 23 mg/dL   Creatinine, Ser 7.05 (H) 0.61 - 1.24 mg/dL   Calcium  8.8 (L) 8.9 - 10.3 mg/dL   GFR, Estimated 23 (L) >60 mL/min    Comment: (NOTE) Calculated using the CKD-EPI Creatinine Equation (2021)    Anion gap 16 (H) 5 - 15    Comment: Performed at [facility], [address], [place], [place] [address]  Glucose, capillary     Status: Abnormal   Collection Time: 12/01/23 10:46 PM  Result Value Ref Range   Glucose-Capillary 206 (H) 70 - 99 mg/dL    Comment: Glucose reference range applies only to samples taken after fasting for at least 8 hours.  Glucose, capillary     Status: Abnormal   Collection Time: 12/01/23 11:47 PM  Result Value Ref Range   Glucose-Capillary 201 (H) 70 - 99 mg/dL    Comment: Glucose reference range applies only to samples taken after fasting for at least 8 hours.  Glucose, capillary     Status: Abnormal   Collection Time: 12/02/23  12:47 AM  Result Value Ref Range   Glucose-Capillary 229 (H) 70 - 99 mg/dL    Comment: Glucose reference range applies only to samples taken after fasting for at least 8 hours.  Glucose, capillary     Status: Abnormal   Collection Time: 12/02/23  2:08 AM  Result Value Ref Range   Glucose-Capillary 250 (H) 70 - 99 mg/dL    Comment: Glucose reference range applies only to samples taken after fasting for at least 8 hours.  Glucose, capillary     Status: Abnormal   Collection Time: 12/02/23  3:40 AM  Result Value Ref Range   Glucose-Capillary 254 (H) 70 - 99 mg/dL    Comment: Glucose reference range applies only to samples taken after fasting for at least 8 hours.  Basic metabolic panel     Status: Abnormal   Collection Time: 12/02/23  4:06 AM  Result Value Ref Range   Sodium 154 (H) 135 - 145 mmol/L   Potassium 3.8 3.5 - 5.1 mmol/L   Chloride 116 (H) 98 - 111 mmol/L   CO2 27 22 - 32 mmol/L  Glucose, Bld 263 (H) 70 - 99 mg/dL    Comment: Glucose reference range applies only to samples taken after fasting for at least 8 hours.   BUN 43 (H) 8 - 23 mg/dL   Creatinine, Ser 7.13 (H) 0.61 - 1.24 mg/dL   Calcium  8.9 8.9 - 10.3 mg/dL   GFR, Estimated 24 (L) >60 mL/min    Comment: (NOTE) Calculated using the CKD-EPI Creatinine Equation (2021)    Anion gap 11 5 - 15    Comment: Performed at [facility], [address], [place], [place] [address]  Glucose, capillary     Status: Abnormal   Collection Time: 12/02/23  4:51 AM  Result Value Ref Range   Glucose-Capillary 216 (H) 70 - 99 mg/dL    Comment: Glucose reference range applies only to samples taken after fasting for at least 8 hours.  Glucose, capillary     Status: Abnormal   Collection Time: 12/02/23  5:56 AM  Result Value Ref Range   Glucose-Capillary 186 (H) 70 - 99 mg/dL    Comment: Glucose reference range applies only to samples taken after fasting for at least 8 hours.  Glucose, capillary     Status: Abnormal    Collection Time: 12/02/23  6:59 AM  Result Value Ref Range   Glucose-Capillary 167 (H) 70 - 99 mg/dL    Comment: Glucose reference range applies only to samples taken after fasting for at least 8 hours.  Basic metabolic panel     Status: Abnormal   Collection Time: 12/02/23  7:37 AM  Result Value Ref Range   Sodium 158 (H) 135 - 145 mmol/L   Potassium 3.7 3.5 - 5.1 mmol/L   Chloride 118 (H) 98 - 111 mmol/L   CO2 29 22 - 32 mmol/L   Glucose, Bld 150 (H) 70 - 99 mg/dL    Comment: Glucose reference range applies only to samples taken after fasting for at least 8 hours.   BUN 42 (H) 8 - 23 mg/dL   Creatinine, Ser 7.18 (H) 0.61 - 1.24 mg/dL   Calcium  9.0 8.9 - 10.3 mg/dL   GFR, Estimated 24 (L) >60 mL/min    Comment: (NOTE) Calculated using the CKD-EPI Creatinine Equation (2021)    Anion gap 11 5 - 15    Comment: Performed at [facility], [address] [place], [place] [address]  Glucose, capillary     Status: Abnormal   Collection Time: 12/02/23  8:03 AM  Result Value Ref Range   Glucose-Capillary 156 (H) 70 - 99 mg/dL    Comment: Glucose reference range applies only to samples taken after fasting for at least 8 hours.  Glucose, capillary     Status: Abnormal   Collection Time: 12/02/23  9:01 AM  Result Value Ref Range   Glucose-Capillary 130 (H) 70 - 99 mg/dL    Comment: Glucose reference range applies only to samples taken after fasting for at least 8 hours.  Glucose, capillary     Status: Abnormal   Collection Time: 12/02/23 10:10 AM  Result Value Ref Range   Glucose-Capillary 176 (H) 70 - 99 mg/dL    Comment: Glucose reference range applies only to samples taken after fasting for at least 8 hours.  Glucose, capillary     Status: Abnormal   Collection Time: 12/02/23 11:19 AM  Result Value Ref Range   Glucose-Capillary 199 (H) 70 - 99 mg/dL    Comment: Glucose reference range applies only to samples  taken after fasting for at least 8 hours.  Glucose, capillary      Status: Abnormal   Collection Time: 12/02/23 12:41 PM  Result Value Ref Range   Glucose-Capillary 175 (H) 70 - 99 mg/dL    Comment: Glucose reference range applies only to samples taken after fasting for at least 8 hours.  Glucose, capillary     Status: Abnormal   Collection Time: 12/02/23  1:45 PM  Result Value Ref Range   Glucose-Capillary 155 (H) 70 - 99 mg/dL    Comment: Glucose reference range applies only to samples taken after fasting for at least 8 hours.  Glucose, capillary     Status: Abnormal   Collection Time: 12/02/23  2:52 PM  Result Value Ref Range   Glucose-Capillary 176 (H) 70 - 99 mg/dL    Comment: Glucose reference range applies only to samples taken after fasting for at least 8 hours.  Glucose, capillary     Status: Abnormal   Collection Time: 12/02/23  3:57 PM  Result Value Ref Range   Glucose-Capillary 164 (H) 70 - 99 mg/dL    Comment: Glucose reference range applies only to samples taken after fasting for at least 8 hours.  Glucose, capillary     Status: Abnormal   Collection Time: 12/02/23  8:49 PM  Result Value Ref Range   Glucose-Capillary 280 (H) 70 - 99 mg/dL    Comment: Glucose reference range applies only to samples taken after fasting for at least 8 hours.  Basic metabolic panel with GFR     Status: Abnormal   Collection Time: 12/03/23  4:09 AM  Result Value Ref Range   Sodium 154 (H) 135 - 145 mmol/L   Potassium 3.8 3.5 - 5.1 mmol/L   Chloride 115 (H) 98 - 111 mmol/L   CO2 28 22 - 32 mmol/L   Glucose, Bld 238 (H) 70 - 99 mg/dL    Comment: Glucose reference range applies only to samples taken after fasting for at least 8 hours.   BUN 40 (H) 8 - 23 mg/dL   Creatinine, Ser 7.12 (H) 0.61 - 1.24 mg/dL   Calcium  9.0 8.9 - 10.3 mg/dL   GFR, Estimated 24 (L) >60 mL/min    Comment: (NOTE) Calculated using the CKD-EPI Creatinine Equation (2021)    Anion gap 11 5 - 15    Comment: Performed at [facility], [address] [place], [place] [address]   Magnesium      Status: Abnormal   Collection Time: 12/03/23  4:09 AM  Result Value Ref Range   Magnesium  2.6 (H) 1.7 - 2.4 mg/dL    Comment: Performed at [facility], [address] [place], [place] [address]  CBC     Status: Abnormal   Collection Time: 12/03/23  4:09 AM  Result Value Ref Range   WBC 6.8 4.0 - 10.5 K/uL   RBC 6.28 (H) 4.22 - 5.81 MIL/uL   Hemoglobin 16.9 13.0 - 17.0 g/dL   HCT 46.9 (H) 60.9 - 47.9 %   MCV 84.4 80.0 - 100.0 fL   MCH 26.9 26.0 - 34.0 pg   MCHC 31.9 30.0 - 36.0 g/dL   RDW 82.4 (H) 88.4 - 84.4 %   Platelets 122 (L) 150 - 400 K/uL   nRBC 0.0 0.0 - 0.2 %    Comment: Performed at [facility], [address] [place], [place] [address]  Glucose, capillary     Status: Abnormal  Collection Time: 12/03/23  8:39 AM  Result Value Ref Range   Glucose-Capillary 392 (H) 70 - 99 mg/dL    Comment: Glucose reference range applies only to samples taken after fasting for at least 8 hours.  Glucose, capillary     Status: Abnormal   Collection Time: 12/03/23 12:07 PM  Result Value Ref Range   Glucose-Capillary 295 (H) 70 - 99 mg/dL    Comment: Glucose reference range applies only to samples taken after fasting for at least 8 hours.  Glucose, capillary     Status: Abnormal   Collection Time: 12/03/23  3:47 PM  Result Value Ref Range   Glucose-Capillary 254 (H) 70 - 99 mg/dL    Comment: Glucose reference range applies only to samples taken after fasting for at least 8 hours.  Glucose, capillary     Status: Abnormal   Collection Time: 12/03/23  9:56 PM  Result Value Ref Range   Glucose-Capillary 246 (H) 70 - 99 mg/dL    Comment: Glucose reference range applies only to samples taken after fasting for at least 8 hours.  Basic metabolic panel with GFR     Status: Abnormal   Collection Time: 12/04/23  4:10 AM  Result Value Ref Range   Sodium 152 (H) 135 - 145 mmol/L   Potassium 3.5 3.5 - 5.1 mmol/L   Chloride 116 (H) 98 - 111 mmol/L   CO2 24 22 - 32  mmol/L   Glucose, Bld 166 (H) 70 - 99 mg/dL    Comment: Glucose reference range applies only to samples taken after fasting for at least 8 hours.   BUN 38 (H) 8 - 23 mg/dL   Creatinine, Ser 7.47 (H) 0.61 - 1.24 mg/dL   Calcium  8.6 (L) 8.9 - 10.3 mg/dL   GFR, Estimated 28 (L) >60 mL/min    Comment: (NOTE) Calculated using the CKD-EPI Creatinine Equation (2021)    Anion gap 12 5 - 15    Comment: Performed at [facility], [address], [place], [place] [address]  Magnesium      Status: None   Collection Time: 12/04/23  4:10 AM  Result Value Ref Range   Magnesium  2.4 1.7 - 2.4 mg/dL    Comment: Performed at [facility], [address] [place], [place] [address]  Glucose, capillary     Status: Abnormal   Collection Time: 12/04/23  7:48 AM  Result Value Ref Range   Glucose-Capillary 180 (H) 70 - 99 mg/dL    Comment: Glucose reference range applies only to samples taken after fasting for at least 8 hours.  Glucose, capillary     Status: Abnormal   Collection Time: 12/04/23 12:43 PM  Result Value Ref Range   Glucose-Capillary 222 (H) 70 - 99 mg/dL    Comment: Glucose reference range applies only to samples taken after fasting for at least 8 hours.  Glucose, capillary     Status: Abnormal   Collection Time: 12/04/23  3:39 PM  Result Value Ref Range   Glucose-Capillary 290 (H) 70 - 99 mg/dL    Comment: Glucose reference range applies only to samples taken after fasting for at least 8 hours.  Glucose, capillary     Status: Abnormal   Collection Time: 12/04/23  8:54 PM  Result Value Ref Range   Glucose-Capillary 217 (H) 70 - 99 mg/dL    Comment: Glucose reference range applies only to samples taken after fasting for at least 8 hours.  Glucose, capillary  Status: Abnormal   Collection Time: 12/04/23 10:51 PM  Result Value Ref Range   Glucose-Capillary 208 (H) 70 - 99 mg/dL    Comment: Glucose reference range applies only to samples taken after fasting for at least 8  hours.  Basic metabolic panel with GFR     Status: Abnormal   Collection Time: 12/05/23  2:26 AM  Result Value Ref Range   Sodium 147 (H) 135 - 145 mmol/L   Potassium 3.5 3.5 - 5.1 mmol/L   Chloride 111 98 - 111 mmol/L   CO2 23 22 - 32 mmol/L   Glucose, Bld 152 (H) 70 - 99 mg/dL    Comment: Glucose reference range applies only to samples taken after fasting for at least 8 hours.   BUN 41 (H) 8 - 23 mg/dL   Creatinine, Ser 7.65 (H) 0.61 - 1.24 mg/dL   Calcium  8.3 (L) 8.9 - 10.3 mg/dL   GFR, Estimated 30 (L) >60 mL/min    Comment: (NOTE) Calculated using the CKD-EPI Creatinine Equation (2021)    Anion gap 13 5 - 15    Comment: Performed at [facility], [address], [place], [place] [address]  Magnesium      Status: None   Collection Time: 12/05/23  2:26 AM  Result Value Ref Range   Magnesium  2.2 1.7 - 2.4 mg/dL    Comment: Performed at [facility], [address] [place], [place] [address]  Glucose, capillary     Status: Abnormal   Collection Time: 12/05/23  8:55 AM  Result Value Ref Range   Glucose-Capillary 209 (H) 70 - 99 mg/dL    Comment: Glucose reference range applies only to samples taken after fasting for at least 8 hours.  Glucose, capillary     Status: Abnormal   Collection Time: 12/05/23 11:23 AM  Result Value Ref Range   Glucose-Capillary 335 (H) 70 - 99 mg/dL    Comment: Glucose reference range applies only to samples taken after fasting for at least 8 hours.  Glucose, capillary     Status: Abnormal   Collection Time: 12/05/23  3:29 PM  Result Value Ref Range   Glucose-Capillary 221 (H) 70 - 99 mg/dL    Comment: Glucose reference range applies only to samples taken after fasting for at least 8 hours.  Glucose, capillary     Status: Abnormal   Collection Time: 12/05/23  9:07 PM  Result Value Ref Range   Glucose-Capillary 167 (H) 70 - 99 mg/dL    Comment: Glucose reference range applies only to samples taken after fasting for at least 8 hours.   Basic metabolic panel with GFR     Status: Abnormal   Collection Time: 12/06/23  4:25 AM  Result Value Ref Range   Sodium 141 135 - 145 mmol/L   Potassium 3.4 (L) 3.5 - 5.1 mmol/L   Chloride 105 98 - 111 mmol/L   CO2 24 22 - 32 mmol/L   Glucose, Bld 196 (H) 70 - 99 mg/dL    Comment: Glucose reference range applies only to samples taken after fasting for at least 8 hours.   BUN 40 (H) 8 - 23 mg/dL   Creatinine, Ser 7.71 (H) 0.61 - 1.24 mg/dL   Calcium  8.1 (L) 8.9 - 10.3 mg/dL   GFR, Estimated 31 (L) >60 mL/min    Comment: (NOTE) Calculated using the CKD-EPI Creatinine Equation (2021)    Anion gap 12 5 - 15    Comment: Performed at  [facility], [address] [place] [address], [place], [place] [address]  Magnesium      Status: None   Collection Time: 12/06/23  4:25 AM  Result Value Ref Range   Magnesium  2.2 1.7 - 2.4 mg/dL    Comment: Performed at [facility], [address], [place], [place] [address]  Glucose, capillary     Status: Abnormal   Collection Time: 12/06/23  7:28 AM  Result Value Ref Range   Glucose-Capillary 184 (H) 70 - 99 mg/dL    Comment: Glucose reference range applies only to samples taken after fasting for at least 8 hours.  Glucose, capillary     Status: Abnormal   Collection Time: 12/06/23 11:43 AM  Result Value Ref Range   Glucose-Capillary 246 (H) 70 - 99 mg/dL    Comment: Glucose reference range applies only to samples taken after fasting for at least 8 hours.  Glucose, capillary     Status: Abnormal   Collection Time: 12/06/23  4:28 PM  Result Value Ref Range   Glucose-Capillary 330 (H) 70 - 99 mg/dL    Comment: Glucose reference range applies only to samples taken after fasting for at least 8 hours.  Resp panel by RT-PCR (RSV, Flu A&B, Covid) Anterior Nasal Swab     Status: None   Collection Time: 12/18/23  3:35 PM   Specimen: Anterior Nasal Swab  Result Value Ref Range   SARS Coronavirus 2 by RT PCR NEGATIVE NEGATIVE   Influenza A by PCR NEGATIVE  NEGATIVE   Influenza B by PCR NEGATIVE NEGATIVE    Comment: (NOTE) The Xpert Xpress SARS-CoV-2/FLU/RSV plus assay is intended as an aid in the diagnosis of influenza from Nasopharyngeal swab specimens and should not be used as a sole basis for treatment. Nasal washings and aspirates are unacceptable for Xpert Xpress SARS-CoV-2/FLU/RSV testing.  Fact Sheet for Patients: bloggercourse.com  Fact Sheet for Healthcare Providers: seriousbroker.it  This test is not yet approved or cleared by the [place]  FDA and has been authorized for detection and/or diagnosis of SARS-CoV-2 by FDA under an Emergency Use Authorization (EUA). This EUA will remain in effect (meaning this test can be used) for the duration of the COVID-19 declaration under Section 564(b)(1) of the Act, 21 U.S.C. section 360bbb-3(b)(1), unless the authorization is terminated or revoked.     Resp Syncytial Virus by PCR NEGATIVE NEGATIVE    Comment: (NOTE) Fact Sheet for Patients: bloggercourse.com  Fact Sheet for Healthcare Providers: seriousbroker.it  This test is not yet approved or cleared by the [place]  FDA and has been authorized for detection and/or diagnosis of SARS-CoV-2 by FDA under an Emergency Use Authorization (EUA). This EUA will remain in effect (meaning this test can be used) for the duration of the COVID-19 declaration under Section 564(b)(1) of the Act, 21 U.S.C. section 360bbb-3(b)(1), unless the authorization is terminated or revoked.  Performed at [facility], [address] [place], [place] [address]   CBC with Differential     Status: Abnormal   Collection Time: 12/18/23  4:39 PM  Result Value Ref Range   WBC 4.6 4.0 - 10.5 K/uL   RBC 5.06 4.22 - 5.81 MIL/uL   Hemoglobin 13.9 13.0 - 17.0 g/dL   HCT 58.6 60.9 - 47.9 %   MCV 81.6 80.0 - 100.0 fL   MCH 27.5 26.0 - 34.0 pg    MCHC 33.7 30.0 - 36.0 g/dL   RDW 84.0 (H) 88.4 - 84.4 %  Platelets 163 150 - 400 K/uL   nRBC 0.0 0.0 - 0.2 %   Neutrophils Relative % 57 %   Neutro Abs 2.6 1.7 - 7.7 K/uL   Lymphocytes Relative 28 %   Lymphs Abs 1.3 0.7 - 4.0 K/uL   Monocytes Relative 12 %   Monocytes Absolute 0.5 0.1 - 1.0 K/uL   Eosinophils Relative 3 %   Eosinophils Absolute 0.1 0.0 - 0.5 K/uL   Basophils Relative 0 %   Basophils Absolute 0.0 0.0 - 0.1 K/uL   Immature Granulocytes 0 %   Abs Immature Granulocytes 0.01 0.00 - 0.07 K/uL    Comment: Performed at [facility], [address] [place], [place] [address]  Comprehensive metabolic panel     Status: Abnormal   Collection Time: 12/18/23  4:39 PM  Result Value Ref Range   Sodium 142 135 - 145 mmol/L   Potassium 3.8 3.5 - 5.1 mmol/L   Chloride 105 98 - 111 mmol/L   CO2 25 22 - 32 mmol/L   Glucose, Bld 99 70 - 99 mg/dL    Comment: Glucose reference range applies only to samples taken after fasting for at least 8 hours.   BUN 22 8 - 23 mg/dL   Creatinine, Ser 8.07 (H) 0.61 - 1.24 mg/dL   Calcium  8.1 (L) 8.9 - 10.3 mg/dL   Total Protein 7.2 6.5 - 8.1 g/dL   Albumin 3.1 (L) 3.5 - 5.0 g/dL   AST 15 15 - 41 U/L   ALT 10 0 - 44 U/L   Alkaline Phosphatase 77 38 - 126 U/L   Total Bilirubin 0.8 0.0 - 1.2 mg/dL   GFR, Estimated 39 (L) >60 mL/min    Comment: (NOTE) Calculated using the CKD-EPI Creatinine Equation (2021)    Anion gap 12 5 - 15    Comment: Performed at [facility], [address] [place], [place] [address]  Troponin I (High Sensitivity)     Status: Abnormal   Collection Time: 12/18/23  4:39 PM  Result Value Ref Range   Troponin I (High Sensitivity) 32 (H) <18 ng/L    Comment: (NOTE) Elevated high sensitivity troponin I (hsTnI) values and significant  changes across serial measurements may suggest ACS but many other  chronic and acute conditions are known to elevate hsTnI results.  Refer to the Links section for chest pain  algorithms and additional  guidance. Performed at [facility], [address] [place], [place] [address]   Brain natriuretic peptide     Status: Abnormal   Collection Time: 12/18/23  4:39 PM  Result Value Ref Range   B Natriuretic Peptide 441.5 (H) 0.0 - 100.0 pg/mL    Comment: Performed at [facility], [address] [place], [place] [address]  CBG monitoring, ED     Status: None   Collection Time: 12/18/23  7:06 PM  Result Value Ref Range   Glucose-Capillary 96 70 - 99 mg/dL    Comment: Glucose reference range applies only to samples taken after fasting for at least 8 hours.  Troponin I (High Sensitivity)     Status: Abnormal   Collection Time: 12/18/23  7:25 PM  Result Value Ref Range   Troponin I (High Sensitivity) 32 (H) <18 ng/L    Comment: (NOTE) Elevated high sensitivity troponin I (hsTnI) values and significant  changes across serial measurements may suggest ACS but many other  chronic and acute conditions are known to elevate hsTnI  results.  Refer to the Links section for chest pain algorithms and additional  guidance. Performed at [facility], [address], [place], [place] [address]   Glucose, capillary     Status: Abnormal   Collection Time: 12/18/23 11:36 PM  Result Value Ref Range   Glucose-Capillary 169 (H) 70 - 99 mg/dL    Comment: Glucose reference range applies only to samples taken after fasting for at least 8 hours.  Basic metabolic panel     Status: Abnormal   Collection Time: 12/19/23  3:57 AM  Result Value Ref Range   Sodium 141 135 - 145 mmol/L   Potassium 3.2 (L) 3.5 - 5.1 mmol/L   Chloride 107 98 - 111 mmol/L   CO2 23 22 - 32 mmol/L   Glucose, Bld 144 (H) 70 - 99 mg/dL    Comment: Glucose reference range applies only to samples taken after fasting for at least 8 hours.   BUN 20 8 - 23 mg/dL   Creatinine, Ser 8.17 (H) 0.61 - 1.24 mg/dL   Calcium  8.0 (L) 8.9 - 10.3 mg/dL   GFR, Estimated 41 (L) >60 mL/min    Comment:  (NOTE) Calculated using the CKD-EPI Creatinine Equation (2021)    Anion gap 11 5 - 15    Comment: Performed at [facility], [address] [place], [place] [address]  Magnesium      Status: None   Collection Time: 12/19/23  3:57 AM  Result Value Ref Range   Magnesium  1.8 1.7 - 2.4 mg/dL    Comment: Performed at [facility], [address], [place], [place] [address]  Glucose, capillary     Status: Abnormal   Collection Time: 12/19/23  4:36 AM  Result Value Ref Range   Glucose-Capillary 132 (H) 70 - 99 mg/dL    Comment: Glucose reference range applies only to samples taken after fasting for at least 8 hours.  Glucose, capillary     Status: Abnormal   Collection Time: 12/19/23 11:17 AM  Result Value Ref Range   Glucose-Capillary 163 (H) 70 - 99 mg/dL    Comment: Glucose reference range applies only to samples taken after fasting for at least 8 hours.  Glucose, capillary     Status: Abnormal   Collection Time: 12/19/23  1:01 PM  Result Value Ref Range   Glucose-Capillary 146 (H) 70 - 99 mg/dL    Comment: Glucose reference range applies only to samples taken after fasting for at least 8 hours.  Glucose, capillary     Status: Abnormal   Collection Time: 12/19/23  3:27 PM  Result Value Ref Range   Glucose-Capillary 130 (H) 70 - 99 mg/dL    Comment: Glucose reference range applies only to samples taken after fasting for at least 8 hours.  Basic metabolic panel     Status: Abnormal   Collection Time: 12/19/23  5:58 PM  Result Value Ref Range   Sodium 141 135 - 145 mmol/L   Potassium 3.7 3.5 - 5.1 mmol/L   Chloride 106 98 - 111 mmol/L   CO2 26 22 - 32 mmol/L   Glucose, Bld 99 70 - 99 mg/dL    Comment: Glucose reference range applies only to samples taken after fasting for at least 8 hours.   BUN 21 8 - 23 mg/dL   Creatinine, Ser 8.05 (H) 0.61 - 1.24 mg/dL   Calcium  8.0 (L) 8.9 - 10.3 mg/dL   GFR, Estimated 38 (L) >60 mL/min  Comment: (NOTE) Calculated using the  CKD-EPI Creatinine Equation (2021)    Anion gap 9 5 - 15    Comment: Performed at [facility], [address] [place], [place] [address]  Glucose, capillary     Status: Abnormal   Collection Time: 12/19/23  9:29 PM  Result Value Ref Range   Glucose-Capillary 143 (H) 70 - 99 mg/dL    Comment: Glucose reference range applies only to samples taken after fasting for at least 8 hours.  Basic metabolic panel with GFR     Status: Abnormal   Collection Time: 12/20/23  3:44 AM  Result Value Ref Range   Sodium 140 135 - 145 mmol/L   Potassium 3.6 3.5 - 5.1 mmol/L   Chloride 104 98 - 111 mmol/L   CO2 27 22 - 32 mmol/L   Glucose, Bld 119 (H) 70 - 99 mg/dL    Comment: Glucose reference range applies only to samples taken after fasting for at least 8 hours.   BUN 23 8 - 23 mg/dL   Creatinine, Ser 7.95 (H) 0.61 - 1.24 mg/dL   Calcium  8.1 (L) 8.9 - 10.3 mg/dL   GFR, Estimated 36 (L) >60 mL/min    Comment: (NOTE) Calculated using the CKD-EPI Creatinine Equation (2021)    Anion gap 9 5 - 15    Comment: Performed at [facility], [address] [place], [place] [address]  Magnesium      Status: None   Collection Time: 12/20/23  3:44 AM  Result Value Ref Range   Magnesium  2.3 1.7 - 2.4 mg/dL    Comment: Performed at [facility], [address], [place], [place] [address]  Glucose, capillary     Status: Abnormal   Collection Time: 12/20/23  7:36 AM  Result Value Ref Range   Glucose-Capillary 141 (H) 70 - 99 mg/dL    Comment: Glucose reference range applies only to samples taken after fasting for at least 8 hours.  Glucose, capillary     Status: Abnormal   Collection Time: 12/20/23 11:44 AM  Result Value Ref Range   Glucose-Capillary 142 (H) 70 - 99 mg/dL    Comment: Glucose reference range applies only to samples taken after fasting for at least 8 hours.  Glucose, capillary     Status: Abnormal   Collection Time: 12/20/23  4:03 PM  Result Value Ref Range    Glucose-Capillary 152 (H) 70 - 99 mg/dL    Comment: Glucose reference range applies only to samples taken after fasting for at least 8 hours.  Magnesium      Status: None   Collection Time: 01/01/24 12:25 PM  Result Value Ref Range   Magnesium  2.3 1.6 - 2.3 mg/dL  CBC     Status: Abnormal   Collection Time: 01/01/24 12:26 PM  Result Value Ref Range   WBC 3.9 3.4 - 10.8 x10E3/uL   RBC 5.86 (H) 4.14 - 5.80 x10E6/uL   Hemoglobin 16.7 13.0 - 17.7 g/dL   Hematocrit 50.5 62.4 - 51.0 %   MCV 84 79 - 97 fL   MCH 28.5 26.6 - 33.0 pg   MCHC 33.8 31.5 - 35.7 g/dL   RDW 83.5 (H) 88.3 - 84.5 %   Platelets 153 150 - 450 x10E3/uL  Basic Metabolic Panel     Status: Abnormal   Collection Time: 01/01/24 12:26 PM  Result Value Ref Range   Glucose 85 70 - 99 mg/dL   BUN 20 8 - 27  mg/dL   Creatinine, Ser 8.30 (H) 0.76 - 1.27 mg/dL   eGFR 45 (L) >40 fO/fpw/8.26   BUN/Creatinine Ratio 12 10 - 24   Sodium 143 134 - 144 mmol/L   Potassium 4.0 3.5 - 5.2 mmol/L   Chloride 102 96 - 106 mmol/L   CO2 25 20 - 29 mmol/L   Calcium  8.9 8.6 - 10.2 mg/dL  Comp Met (CMET)     Status: Abnormal (Preliminary result)   Collection Time: 01/20/24  3:59 PM  Result Value Ref Range   Glucose 128 (H) 70 - 99 mg/dL   BUN 22 8 - 27 mg/dL   Creatinine, Ser 7.86 (H) 0.76 - 1.27 mg/dL   eGFR 34 (L) >40 fO/fpw/8.26   BUN/Creatinine Ratio 10 10 - 24   Sodium 145 (H) 134 - 144 mmol/L   Potassium 3.5 3.5 - 5.2 mmol/L   Chloride WILL FOLLOW    CO2 22 20 - 29 mmol/L   Calcium  8.5 (L) 8.6 - 10.2 mg/dL   Total Protein 8.0 6.0 - 8.5 g/dL   Albumin 4.3 3.9 - 4.9 g/dL   Globulin, Total 3.7 1.5 - 4.5 g/dL   Bilirubin Total 0.7 0.0 - 1.2 mg/dL   Alkaline Phosphatase 100 47 - 123 IU/L   AST 34 0 - 40 IU/L   ALT 15 0 - 44 IU/L  Magnesium      Status: None   Collection Time: 01/20/24  3:59 PM  Result Value Ref Range   Magnesium  2.3 1.6 - 2.3 mg/dL  CBC     Status: Abnormal   Collection Time: 01/20/24  3:59 PM  Result Value Ref  Range   WBC 3.8 3.4 - 10.8 x10E3/uL   RBC 6.05 (H) 4.14 - 5.80 x10E6/uL   Hemoglobin 17.3 13.0 - 17.7 g/dL   Hematocrit 50.4 62.4 - 51.0 %   MCV 82 79 - 97 fL   MCH 28.6 26.6 - 33.0 pg   MCHC 34.9 31.5 - 35.7 g/dL   RDW 83.4 (H) 88.3 - 84.5 %   Platelets 123 (L) 150 - 450 x10E3/uL  B Nat Peptide     Status: Abnormal   Collection Time: 01/20/24  3:59 PM  Result Value Ref Range   BNP 186.1 (H) 0.0 - 100.0 pg/mL    Comment: Siemens ADVIA Centaur XP methodology    No results found for: CHOL No results found for: HDL No results found for: LDLCALC No results found for: TRIG No results found for: CHOLHDL No results found for: LDLDIRECT  Last metabolic panel Lab Results  Component Value Date   GLUCOSE 128 (H) 01/20/2024   NA 145 (H) 01/20/2024   K 3.5 01/20/2024   CL WILL FOLLOW 01/20/2024   CO2 22 01/20/2024   BUN 22 01/20/2024   CREATININE 2.13 (H) 01/20/2024   EGFR 34 (L) 01/20/2024   CALCIUM  8.5 (L) 01/20/2024   PROT 8.0 01/20/2024   ALBUMIN 4.3 01/20/2024   LABGLOB 3.7 01/20/2024   BILITOT 0.7 01/20/2024   ALKPHOS 100 01/20/2024   AST 34 01/20/2024   ALT 15 01/20/2024   ANIONGAP 9 12/20/2023    Lab Results  Component Value Date   HGBA1C 9.4 (H) 12/01/2023    Last CBC Lab Results  Component Value Date   WBC 3.8 01/20/2024   HGB 17.3 01/20/2024   HCT 49.5 01/20/2024   MCV 82 01/20/2024   MCH 28.6 01/20/2024   RDW 16.5 (H) 01/20/2024   PLT 123 (L) 01/20/2024  Lab Results  Component Value Date   TSH 0.831 04/09/2023    No results found for: PSA1, PSA  Last vitamin D No results found for: MARIEN BOLLS, VD25OH  Lab Results  Component Value Date   COLORU yellow 08/12/2014   CLARITYU clear 08/12/2014   GLUCOSEUR 500 08/12/2014   BILIRUBINUR NEGATIVE 12/01/2023   KETONESU neg 08/12/2014   SPECGRAV 1.010 08/12/2014   RBCUR neg 08/12/2014   PHUR 5.5 08/12/2014   PROTEINUR 100 (A) 12/01/2023   UROBILINOGEN 0.2  08/12/2014   LEUKOCYTESUR NEGATIVE 12/01/2023    Lab Results  Component Value Date   MICROALBUR 0.6 08/12/2014     At today's visit, we discussed treatment options, associated risk and benefits, and engage in counseling as needed.  Additionally the following were reviewed: Past medical records, past medical and surgical history, family and social background, as well as relevant laboratory results, imaging findings, and specialty notes, where applicable.  This message was generated using dictation software, and as a result, it may contain unintentional typos or errors.  Nevertheless, extensive effort was made to accurately convey at the pertinent aspects of the patient visit.    There may have been are other unrelated non-urgent complaints, but due to the busy schedule and the amount of time already spent with him, time does not permit to address these issues at today's visit. Another appointment may have or has been requested to review these additional issues.     Beverley KATHEE Hummer, MD  I,Emily Lagle,acting as a scribe for Beverley KATHEE Hummer, MD.,have documented all relevant documentation on the behalf of Beverley KATHEE Hummer, MD.  LILLETTE Beverley KATHEE Hummer, MD, have reviewed all documentation for this visit. The documentation on 01/22/2024 for the exam, diagnosis, procedures, and orders are all accurate and complete.    [1]  Allergies Allergen Reactions   Contrast Media [Iodinated Contrast Media] Shortness Of Breath, Nausea And Vomiting, Swelling and Other (See Comments)    Facial swelling and felt flushed, too   Iodine Shortness Of Breath, Nausea And Vomiting, Swelling and Other (See Comments)    Facial swelling and felt flushed, too

## 2024-01-26 ENCOUNTER — Other Ambulatory Visit: Payer: Self-pay | Admitting: Family Medicine

## 2024-01-28 ENCOUNTER — Telehealth: Payer: Self-pay

## 2024-01-28 DIAGNOSIS — R5381 Other malaise: Secondary | ICD-10-CM

## 2024-01-28 DIAGNOSIS — I5033 Acute on chronic diastolic (congestive) heart failure: Secondary | ICD-10-CM

## 2024-01-28 DIAGNOSIS — R2681 Unsteadiness on feet: Secondary | ICD-10-CM

## 2024-01-28 NOTE — Telephone Encounter (Signed)
 Received call from home health nurse regarding patient.   She reports that patient has had a change in insurance plan from [facility] to [place]. [facility] is not in network with new insurance plan.   Patient's wife is requesting that provider place an order for outpatient PT/OT.   Will forward request to Dr. Toma.   Chiquita JAYSON English, RN

## 2024-01-28 NOTE — Telephone Encounter (Signed)
 Placed order for outpatient PT, do not believe patient was getting OT prior.

## 2024-01-31 ENCOUNTER — Other Ambulatory Visit: Payer: Self-pay | Admitting: Nephrology

## 2024-01-31 DIAGNOSIS — N2889 Other specified disorders of kidney and ureter: Secondary | ICD-10-CM

## 2024-02-03 LAB — BASIC METABOLIC PANEL WITH GFR

## 2024-02-10 NOTE — H&P (View-Only) (Signed)
 "  Primary Care Physician: Toma Matas, MD Primary Cardiologist: Lonni LITTIE Nanas, MD Electrophysiologist: None  Referring Physician: Lonni LITTIE Nanas, MD   Justin Molina. is a 64 y.o. 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  The patient was initially diagnosed with atrial fibrillation while living in [place] . He was previously treated with Multaq  for rhythm management which was discontinued due to CHF exacerbations. He has been admitted several times for acute on chronic CHF and is currently followed by Dr. Zenaida in the advanced heart failure clinic.  2D echo was completed on 08/2023 showing EF of 50-55% with normal RV function and no significant valve abnormalities.   He was admitted on 12/02/2023 for HHNK with tachycardia and was started on Cardizem  drip and an insulin  drip with transition to p.o. Cardizem .  He was discharged and seen back in the ED on 12/18/2023 with complaint of shortness of breath secondary to CHF and was diuresed with IV Lasix  and transition to p.o. torsemide .  He was seen in follow-up on 12/23/23 with heart rate better controlled however maintaining AF.  He was scheduled for DCCV which was completed on 01/07/2024 with successful conversion to sinus rhythm. He was seen back in follow-up on 01/20/2024 but had converted back to atrial fibrillation due to recent upper respiratory infection and was referred to AF clinic to discuss further rhythm control options.  [name] presents today with his wife for follow-up in the atrial fibrillation clinic.  On examination today he is still in rate controlled A-fib but denies any symptoms of shortness of breath.  He reports being compliant with his Eliquis  and denies any missed doses.  He has been compliant with his Cardizem  in  addition to his carvedilol  and reports no additional adverse reactions.  During today's visit we discussed  both rate and rhythm control options regarding atrial fibrillation.  We also briefly discussed the pathophysiology of atrial fibrillation.  We discussed the importance of compliance with Xarelto and the possibility of discussing rhythm control options if atrial fibrillation returns after upcoming cardioversion.  Today, he denies symptoms of palpitations, chest pain, shortness of breath, orthopnea, PND, lower extremity edema, dizziness, presyncope, syncope, snoring, daytime somnolence, bleeding, or neurologic sequela. The patient is tolerating medications without difficulties and is otherwise without complaint today.   Atrial Fibrillation Management history:  Previous antiarrhythmic drugs: Multaq  Previous cardioversions: 01/07/2024 Previous ablations: None Anticoagulation history: Eliquis   ROS- All systems are reviewed and negative except as per the HPI above.  Past Medical History:  Diagnosis Date   Chronic renal insufficiency    Diabetes mellitus without complication (HCC)    Hypertension    Stroke Justin Molina)     Current Outpatient Medications  Medication Sig Dispense Refill   acetaminophen  (TYLENOL ) 500 MG tablet Take 1 tablet (500 mg total) by mouth every 6 (six) hours as needed. (Patient taking differently: Take 500 mg by mouth as needed.) 30 tablet 0   albuterol  (VENTOLIN  HFA) 108 (90 Base) MCG/ACT inhaler Inhale 2 puffs into the lungs every 4 (four) hours as needed for wheezing or shortness of breath. (Patient taking differently: Inhale 2 puffs into the lungs as needed for wheezing or shortness of breath.)     atorvastatin  (LIPITOR ) 80 MG tablet Take 1 tablet (80 mg total) by mouth at bedtime. 30 tablet 0  Blood Pressure Monitoring (OMRON 3 SERIES BP MONITOR) DEVI Use to check blood pressure daily. 1 each 0   carvedilol  (COREG ) 12.5 MG tablet Take 1 tablet (12.5 mg total) by mouth 2  (two) times daily with a meal. 60 tablet 0   doxazosin  (CARDURA ) 4 MG tablet Take 4 mg by mouth at bedtime.     empagliflozin  (JARDIANCE ) 10 MG TABS tablet Take 1 tablet (10 mg total) by mouth daily. 30 tablet 0   fluticasone (FLONASE) 50 MCG/ACT nasal spray Place 2 sprays into both nostrils daily as needed for allergies or rhinitis. (Patient taking differently: Place 2 sprays into both nostrils as needed for allergies or rhinitis.)     glucose blood (ACCU-CHEK AVIVA PLUS) test strip Use as instructed for TID and QHS blood glucose testing 100 each 12   isosorbide  mononitrate (IMDUR ) 120 MG 24 hr tablet TAKE 1 TABLET BY MOUTH ONE TIME A DAY 90 tablet 0   Lancets 28G MISC Check blood sugar TID & QHS 100 each 12   loratadine  (CLARITIN ) 10 MG tablet Take 10 mg by mouth daily as needed for allergies or rhinitis. (Patient taking differently: Take 10 mg by mouth as needed for allergies or rhinitis.)     losartan  (COZAAR ) 25 MG tablet Take 25 mg by mouth daily.     nystatin cream (MYCOSTATIN) Apply 1 Application topically daily as needed (yeast). (Patient taking differently: Apply 1 Application topically as needed (yeast).)     OZEMPIC, 0.25 OR 0.5 MG/DOSE, 2 MG/3ML SOPN Inject 0.25 mg into the skin every Sunday.     potassium chloride  SA (KLOR-CON  M) 20 MEQ tablet Take 1 tablet (20 mEq total) by mouth daily.     torsemide  (DEMADEX ) 20 MG tablet Take 2 tablets (40 mg total) by mouth daily. Can take an additional tablet prn shortness of breath 60 tablet 0   diltiazem  (CARDIZEM  CD) 360 MG 24 hr capsule Take 1 capsule (360 mg total) by mouth daily. 30 capsule 3   ELIQUIS  5 MG TABS tablet Take 1 tablet (5 mg total) by mouth 2 (two) times daily. 180 tablet 0   No current facility-administered medications for this encounter.    Physical Exam: BP 124/80   Pulse 75   Ht 5' 11 (1.803 m)   Wt 132.3 kg   BMI 40.67 kg/m   GEN: Well nourished, well developed in no acute distress NECK: No JVD; No carotid  bruits CARDIAC: Irregularly irregular rate and rhythm, no murmurs, rubs, gallops RESPIRATORY:  Clear to auscultation without rales, wheezing or rhonchi  ABDOMEN: Soft, non-tender, non-distended EXTREMITIES:  No edema; No deformity   Wt Readings from Last 3 Encounters:  02/11/24 132.3 kg  01/20/24 122.9 kg  01/07/24 124.7 kg     EKG today demonstrates:   EKG Interpretation Date/Time:  Tuesday February 11 2024 14:54:39 EST Ventricular Rate:  75 PR Interval:    QRS Duration:  102 QT Interval:  408 QTC Calculation: 455 R Axis:   243  Text Interpretation: Atrial fibrillation Right superior axis deviation Confirmed by Wyn Manus [phone]) on 02/11/2024 3:45:38 PM        Echo Completed 08/06/2023: 1. Left ventricular ejection fraction, by estimation, is 50 to 55%. The  left ventricle has low normal function. The left ventricle has no regional  wall motion abnormalities. There is mild left ventricular hypertrophy.  Left ventricular diastolic  parameters are indeterminate. The average left ventricular global  longitudinal strain is -17.6 %. The global longitudinal strain  is normal.   2. Right ventricular systolic function is normal. The right ventricular  size is normal.   3. The mitral valve is normal in structure. No evidence of mitral valve  regurgitation. No evidence of mitral stenosis.   4. The aortic valve is normal in structure. Aortic valve regurgitation is  not visualized. No aortic stenosis is present.   5. The inferior vena cava is normal in size with greater than 50%  respiratory variability, suggesting right atrial pressure of 3 mmHg.   CHA2DS2-VASc Score = 5  The patient's score is based upon: CHF History: 1 HTN History: 1 Diabetes History: 1 Stroke History: 2 Vascular Disease History: 0 Age Score: 0 Gender Score: 0       ASSESSMENT AND PLAN: Persistent Atrial Fibrillation (ICD10:  I48.19) The patient's CHA2DS2-VASc score is 5, indicating a 7.2% annual risk  of stroke.   - DCCV on 01/07/2024 with early return of A-fib and previously managed with Multaq  until ejection fraction decreased - Today patient is in rate controlled atrial fibrillation which developed the following a recent upper respiratory infection. -We will repeat DCCV due to recent acute illness with upper respiratory infection. -Discontinue amlodipine  10 mg today -Increase Cardizem  to 360 mg daily -Continue carvedilol  12.5 mg twice daily -Patient will hold Ozempic 1 week prior to-cardioversion and will also hold Jardiance  3 days prior to cardioversion -Check BMET and CBC today - if early return of AF occurs we will discuss referral to EP or possible addition of antiarrhythmic medications such as Tikosyn. - Continue Eliquis  5 mg twice daily  Secondary Hypercoagulable State (ICD10:  D68.69) The patient is at significant risk for stroke/thromboembolism based upon his CHA2DS2-VASc Score of 5.  Continue Rivaroxaban (Xarelto).   HFpEF: - 2D echo was completed on 08/2023 showing low normal EF of 50-55% -Today patient is euvolemic on examination and denies any shortness of breath. -Continue current GDMT as prescribed  HTN: BP well controlled. Continue current antihypertensive regimen.  - Discontinue amlodipine  10 mg as noted above - Continue carvedilol  12.5 mg  CKD stage IIIb: - Currently followed by nephrology  Informed Consent   Shared Decision Making/Informed Consent The risks (stroke, cardiac arrhythmias rarely resulting in the need for a temporary or permanent pacemaker, skin irritation or burns and complications associated with conscious sedation including aspiration, arrhythmia, respiratory failure and death), benefits (restoration of normal sinus rhythm) and alternatives of a direct current cardioversion were explained in detail to Mr. Cocuzza and he agrees to proceed.      Signed,  Wyn Molina, Jackee Shove, NP    02/11/2024 3:45 PM    Follow up with the AF Clinic 2 weeks  following cardioversion  "

## 2024-02-10 NOTE — Progress Notes (Signed)
 "  Primary Care Physician: Toma Matas, MD Primary Cardiologist: Lonni LITTIE Nanas, MD Electrophysiologist: None  Referring Physician: Lonni LITTIE Nanas, MD   Justin Molina. is a 64 y.o. 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  The patient was initially diagnosed with atrial fibrillation while living in [place] . He was previously treated with Multaq  for rhythm management which was discontinued due to CHF exacerbations. He has been admitted several times for acute on chronic CHF and is currently followed by Dr. Zenaida in the advanced heart failure clinic.  2D echo was completed on 08/2023 showing EF of 50-55% with normal RV function and no significant valve abnormalities.   He was admitted on 12/02/2023 for HHNK with tachycardia and was started on Cardizem  drip and an insulin  drip with transition to p.o. Cardizem .  He was discharged and seen back in the ED on 12/18/2023 with complaint of shortness of breath secondary to CHF and was diuresed with IV Lasix  and transition to p.o. torsemide .  He was seen in follow-up on 12/23/23 with heart rate better controlled however maintaining AF.  He was scheduled for DCCV which was completed on 01/07/2024 with successful conversion to sinus rhythm. He was seen back in follow-up on 01/20/2024 but had converted back to atrial fibrillation due to recent upper respiratory infection and was referred to AF clinic to discuss further rhythm control options.  [name] presents today with his wife for follow-up in the atrial fibrillation clinic.  On examination today he is still in rate controlled A-fib but denies any symptoms of shortness of breath.  He reports being compliant with his Eliquis  and denies any missed doses.  He has been compliant with his Cardizem  in  addition to his carvedilol  and reports no additional adverse reactions.  During today's visit we discussed  both rate and rhythm control options regarding atrial fibrillation.  We also briefly discussed the pathophysiology of atrial fibrillation.  We discussed the importance of compliance with Xarelto and the possibility of discussing rhythm control options if atrial fibrillation returns after upcoming cardioversion.  Today, he denies symptoms of palpitations, chest pain, shortness of breath, orthopnea, PND, lower extremity edema, dizziness, presyncope, syncope, snoring, daytime somnolence, bleeding, or neurologic sequela. The patient is tolerating medications without difficulties and is otherwise without complaint today.   Atrial Fibrillation Management history:  Previous antiarrhythmic drugs: Multaq  Previous cardioversions: 01/07/2024 Previous ablations: None Anticoagulation history: Eliquis   ROS- All systems are reviewed and negative except as per the HPI above.  Past Medical History:  Diagnosis Date   Chronic renal insufficiency    Diabetes mellitus without complication (HCC)    Hypertension    Stroke Justin Molina)     Current Outpatient Medications  Medication Sig Dispense Refill   acetaminophen  (TYLENOL ) 500 MG tablet Take 1 tablet (500 mg total) by mouth every 6 (six) hours as needed. (Patient taking differently: Take 500 mg by mouth as needed.) 30 tablet 0   albuterol  (VENTOLIN  HFA) 108 (90 Base) MCG/ACT inhaler Inhale 2 puffs into the lungs every 4 (four) hours as needed for wheezing or shortness of breath. (Patient taking differently: Inhale 2 puffs into the lungs as needed for wheezing or shortness of breath.)     atorvastatin  (LIPITOR ) 80 MG tablet Take 1 tablet (80 mg total) by mouth at bedtime. 30 tablet 0  Blood Pressure Monitoring (OMRON 3 SERIES BP MONITOR) DEVI Use to check blood pressure daily. 1 each 0   carvedilol  (COREG ) 12.5 MG tablet Take 1 tablet (12.5 mg total) by mouth 2  (two) times daily with a meal. 60 tablet 0   doxazosin  (CARDURA ) 4 MG tablet Take 4 mg by mouth at bedtime.     empagliflozin  (JARDIANCE ) 10 MG TABS tablet Take 1 tablet (10 mg total) by mouth daily. 30 tablet 0   fluticasone (FLONASE) 50 MCG/ACT nasal spray Place 2 sprays into both nostrils daily as needed for allergies or rhinitis. (Patient taking differently: Place 2 sprays into both nostrils as needed for allergies or rhinitis.)     glucose blood (ACCU-CHEK AVIVA PLUS) test strip Use as instructed for TID and QHS blood glucose testing 100 each 12   isosorbide  mononitrate (IMDUR ) 120 MG 24 hr tablet TAKE 1 TABLET BY MOUTH ONE TIME A DAY 90 tablet 0   Lancets 28G MISC Check blood sugar TID & QHS 100 each 12   loratadine  (CLARITIN ) 10 MG tablet Take 10 mg by mouth daily as needed for allergies or rhinitis. (Patient taking differently: Take 10 mg by mouth as needed for allergies or rhinitis.)     losartan  (COZAAR ) 25 MG tablet Take 25 mg by mouth daily.     nystatin cream (MYCOSTATIN) Apply 1 Application topically daily as needed (yeast). (Patient taking differently: Apply 1 Application topically as needed (yeast).)     OZEMPIC, 0.25 OR 0.5 MG/DOSE, 2 MG/3ML SOPN Inject 0.25 mg into the skin every Sunday.     potassium chloride  SA (KLOR-CON  M) 20 MEQ tablet Take 1 tablet (20 mEq total) by mouth daily.     torsemide  (DEMADEX ) 20 MG tablet Take 2 tablets (40 mg total) by mouth daily. Can take an additional tablet prn shortness of breath 60 tablet 0   diltiazem  (CARDIZEM  CD) 360 MG 24 hr capsule Take 1 capsule (360 mg total) by mouth daily. 30 capsule 3   ELIQUIS  5 MG TABS tablet Take 1 tablet (5 mg total) by mouth 2 (two) times daily. 180 tablet 0   No current facility-administered medications for this encounter.    Physical Exam: BP 124/80   Pulse 75   Ht 5' 11 (1.803 m)   Wt 132.3 kg   BMI 40.67 kg/m   GEN: Well nourished, well developed in no acute distress NECK: No JVD; No carotid  bruits CARDIAC: Irregularly irregular rate and rhythm, no murmurs, rubs, gallops RESPIRATORY:  Clear to auscultation without rales, wheezing or rhonchi  ABDOMEN: Soft, non-tender, non-distended EXTREMITIES:  No edema; No deformity   Wt Readings from Last 3 Encounters:  02/11/24 132.3 kg  01/20/24 122.9 kg  01/07/24 124.7 kg     EKG today demonstrates:   EKG Interpretation Date/Time:  Tuesday February 11 2024 14:54:39 EST Ventricular Rate:  75 PR Interval:    QRS Duration:  102 QT Interval:  408 QTC Calculation: 455 R Axis:   243  Text Interpretation: Atrial fibrillation Right superior axis deviation Confirmed by Wyn Manus [phone]) on 02/11/2024 3:45:38 PM        Echo Completed 08/06/2023: 1. Left ventricular ejection fraction, by estimation, is 50 to 55%. The  left ventricle has low normal function. The left ventricle has no regional  wall motion abnormalities. There is mild left ventricular hypertrophy.  Left ventricular diastolic  parameters are indeterminate. The average left ventricular global  longitudinal strain is -17.6 %. The global longitudinal strain  is normal.   2. Right ventricular systolic function is normal. The right ventricular  size is normal.   3. The mitral valve is normal in structure. No evidence of mitral valve  regurgitation. No evidence of mitral stenosis.   4. The aortic valve is normal in structure. Aortic valve regurgitation is  not visualized. No aortic stenosis is present.   5. The inferior vena cava is normal in size with greater than 50%  respiratory variability, suggesting right atrial pressure of 3 mmHg.   CHA2DS2-VASc Score = 5  The patient's score is based upon: CHF History: 1 HTN History: 1 Diabetes History: 1 Stroke History: 2 Vascular Disease History: 0 Age Score: 0 Gender Score: 0       ASSESSMENT AND PLAN: Persistent Atrial Fibrillation (ICD10:  I48.19) The patient's CHA2DS2-VASc score is 5, indicating a 7.2% annual risk  of stroke.   - DCCV on 01/07/2024 with early return of A-fib and previously managed with Multaq  until ejection fraction decreased - Today patient is in rate controlled atrial fibrillation which developed the following a recent upper respiratory infection. -We will repeat DCCV due to recent acute illness with upper respiratory infection. -Discontinue amlodipine  10 mg today -Increase Cardizem  to 360 mg daily -Continue carvedilol  12.5 mg twice daily -Patient will hold Ozempic 1 week prior to-cardioversion and will also hold Jardiance  3 days prior to cardioversion -Check BMET and CBC today - if early return of AF occurs we will discuss referral to EP or possible addition of antiarrhythmic medications such as Tikosyn. - Continue Eliquis  5 mg twice daily  Secondary Hypercoagulable State (ICD10:  D68.69) The patient is at significant risk for stroke/thromboembolism based upon his CHA2DS2-VASc Score of 5.  Continue Rivaroxaban (Xarelto).   HFpEF: - 2D echo was completed on 08/2023 showing low normal EF of 50-55% -Today patient is euvolemic on examination and denies any shortness of breath. -Continue current GDMT as prescribed  HTN: BP well controlled. Continue current antihypertensive regimen.  - Discontinue amlodipine  10 mg as noted above - Continue carvedilol  12.5 mg  CKD stage IIIb: - Currently followed by nephrology  Informed Consent   Shared Decision Making/Informed Consent The risks (stroke, cardiac arrhythmias rarely resulting in the need for a temporary or permanent pacemaker, skin irritation or burns and complications associated with conscious sedation including aspiration, arrhythmia, respiratory failure and death), benefits (restoration of normal sinus rhythm) and alternatives of a direct current cardioversion were explained in detail to Mr. Cocuzza and he agrees to proceed.      Signed,  Wyn Molina, Jackee Shove, NP    02/11/2024 3:45 PM    Follow up with the AF Clinic 2 weeks  following cardioversion  "

## 2024-02-11 ENCOUNTER — Encounter (HOSPITAL_COMMUNITY): Payer: Self-pay | Admitting: Nurse Practitioner

## 2024-02-11 ENCOUNTER — Ambulatory Visit (HOSPITAL_COMMUNITY)
Admission: RE | Admit: 2024-02-11 | Discharge: 2024-02-11 | Disposition: A | Discharge status: Home / Self Care | Source: Ambulatory Visit | Attending: Nurse Practitioner | Admitting: Nurse Practitioner

## 2024-02-11 ENCOUNTER — Other Ambulatory Visit (HOSPITAL_COMMUNITY): Payer: Self-pay

## 2024-02-11 VITALS — BP 124/80 | HR 75 | Ht 71.0 in | Wt 291.6 lb

## 2024-02-11 DIAGNOSIS — I5032 Chronic diastolic (congestive) heart failure: Secondary | ICD-10-CM | POA: Diagnosis not present

## 2024-02-11 DIAGNOSIS — I4819 Other persistent atrial fibrillation: Secondary | ICD-10-CM

## 2024-02-11 DIAGNOSIS — N1831 Chronic kidney disease, stage 3a: Secondary | ICD-10-CM | POA: Diagnosis not present

## 2024-02-11 DIAGNOSIS — I4891 Unspecified atrial fibrillation: Secondary | ICD-10-CM | POA: Diagnosis not present

## 2024-02-11 DIAGNOSIS — I10 Essential (primary) hypertension: Secondary | ICD-10-CM | POA: Diagnosis not present

## 2024-02-11 DIAGNOSIS — I480 Paroxysmal atrial fibrillation: Secondary | ICD-10-CM

## 2024-02-11 MED ORDER — DILTIAZEM HCL ER COATED BEADS 360 MG PO CP24: 360.0000 mg | ORAL_CAPSULE | Freq: Every day | ORAL | 3 refills | Status: DC

## 2024-02-11 MED ORDER — OMRON 3 SERIES BP MONITOR DEVI
1.0000 | Freq: Every day | 0 refills | Status: AC
  Filled 2024-02-11: qty 1, 30d supply, fill #0

## 2024-02-11 NOTE — Patient Instructions (Addendum)
 MyChart Username [id number]    Increase diltiazem  (CARDIZEM  CD) TO 360 MG ONCE DAILY   Hold Ozempic and  empagliflozin  (JARDIANCE ) until after procedure then may resume   STOP amlodipine       Cardioversion scheduled for: 02/17/24 Monday at 8;00 am    - Arrive at the [employer] [facility] ([address]  and check in with ADMITTING at 8:00 am    - Do not eat or drink anything after midnight the night prior to your procedure.   - Take all your morning medication (except diabetic medications) with a sip of water prior to arrival.  - Do NOT miss any doses of your blood thinner - if you should miss a dose or take a dose more than 4 hours late -- please notify our office immediately.  - You will not be able to drive home after your procedure. Please ensure you have a responsible adult to drive you home. You will need someone with you for 24 hours post procedure.     - Expect to be in the procedural area approximately 2 hours.   - If you feel as if you go back into normal rhythm prior to scheduled cardioversion, please notify our office immediately.   If your procedure is canceled in the cardioversion suite you will be charged a cancellation fee.    Hold below medications 7 days prior to scheduled procedure/anesthesia.  Restart medication on the normal dosing day after scheduled procedure/anesthesia  Semaglutide (Ozempic) [facility])    Hold below medications 72 hours prior to scheduled procedure/anesthesia. Restart medication on the following day after scheduled procedure/anesthesia Empagliflozin  (Jardiance )   For those patients who have a scheduled procedure/anesthesia on the same day of the week as their dose, hold the medication on the day of surgery.  They can take their scheduled dose the week before.  **Patients on the above medications scheduled for elective procedures that have not held the medication for the appropriate amount of time are  at risk of cancellation or change in the anesthetic plan.

## 2024-02-12 ENCOUNTER — Ambulatory Visit (HOSPITAL_COMMUNITY): Payer: Self-pay | Admitting: Nurse Practitioner

## 2024-02-12 LAB — CBC
Hematocrit: 48.5 % (ref 37.5–51.0)
Hemoglobin: 16 g/dL (ref 13.0–17.7)
MCH: 27.9 pg (ref 26.6–33.0)
MCHC: 33 g/dL (ref 31.5–35.7)
MCV: 85 fL (ref 79–97)
Platelets: 195 x10E3/uL (ref 150–450)
RBC: 5.74 x10E6/uL (ref 4.14–5.80)
RDW: 16.1 % — ABNORMAL HIGH (ref 11.6–15.4)
WBC: 4.4 x10E3/uL (ref 3.4–10.8)

## 2024-02-12 LAB — BASIC METABOLIC PANEL WITH GFR
BUN/Creatinine Ratio: 12 (ref 10–24)
BUN: 23 mg/dL (ref 8–27)
CO2: 23 mmol/L (ref 20–29)
Calcium: 9.1 mg/dL (ref 8.6–10.2)
Chloride: 104 mmol/L (ref 96–106)
Creatinine, Ser: 1.87 mg/dL — ABNORMAL HIGH (ref 0.76–1.27)
Glucose: 83 mg/dL (ref 70–99)
Potassium: 3.8 mmol/L (ref 3.5–5.2)
Sodium: 145 mmol/L — ABNORMAL HIGH (ref 134–144)
eGFR: 40 mL/min/1.73 — ABNORMAL LOW

## 2024-02-17 ENCOUNTER — Encounter (HOSPITAL_COMMUNITY)
Admission: RE | Disposition: A | Discharge status: Home / Self Care | Payer: Self-pay | Source: Home / Self Care | Attending: Student in an Organized Health Care Education/Training Program

## 2024-02-17 ENCOUNTER — Ambulatory Visit (HOSPITAL_COMMUNITY): Payer: Self-pay | Admitting: Certified Registered"

## 2024-02-17 ENCOUNTER — Ambulatory Visit (HOSPITAL_COMMUNITY)
Admission: RE | Admit: 2024-02-17 | Discharge: 2024-02-17 | Disposition: A | Attending: Student in an Organized Health Care Education/Training Program | Admitting: Student in an Organized Health Care Education/Training Program

## 2024-02-17 ENCOUNTER — Encounter (HOSPITAL_COMMUNITY): Payer: Self-pay | Admitting: Certified Registered"

## 2024-02-17 ENCOUNTER — Encounter (HOSPITAL_COMMUNITY): Payer: Self-pay | Admitting: Student in an Organized Health Care Education/Training Program

## 2024-02-17 DIAGNOSIS — E66813 Obesity, class 3: Secondary | ICD-10-CM | POA: Diagnosis not present

## 2024-02-17 DIAGNOSIS — I4819 Other persistent atrial fibrillation: Secondary | ICD-10-CM

## 2024-02-17 DIAGNOSIS — N1832 Chronic kidney disease, stage 3b: Secondary | ICD-10-CM | POA: Diagnosis not present

## 2024-02-17 DIAGNOSIS — E1122 Type 2 diabetes mellitus with diabetic chronic kidney disease: Secondary | ICD-10-CM | POA: Insufficient documentation

## 2024-02-17 DIAGNOSIS — I130 Hypertensive heart and chronic kidney disease with heart failure and stage 1 through stage 4 chronic kidney disease, or unspecified chronic kidney disease: Secondary | ICD-10-CM | POA: Diagnosis not present

## 2024-02-17 DIAGNOSIS — Z79899 Other long term (current) drug therapy: Secondary | ICD-10-CM | POA: Insufficient documentation

## 2024-02-17 DIAGNOSIS — I10 Essential (primary) hypertension: Secondary | ICD-10-CM | POA: Diagnosis not present

## 2024-02-17 DIAGNOSIS — I5032 Chronic diastolic (congestive) heart failure: Secondary | ICD-10-CM | POA: Diagnosis not present

## 2024-02-17 DIAGNOSIS — E785 Hyperlipidemia, unspecified: Secondary | ICD-10-CM | POA: Insufficient documentation

## 2024-02-17 DIAGNOSIS — E119 Type 2 diabetes mellitus without complications: Secondary | ICD-10-CM | POA: Diagnosis not present

## 2024-02-17 DIAGNOSIS — Z7984 Long term (current) use of oral hypoglycemic drugs: Secondary | ICD-10-CM | POA: Insufficient documentation

## 2024-02-17 DIAGNOSIS — G4733 Obstructive sleep apnea (adult) (pediatric): Secondary | ICD-10-CM | POA: Diagnosis not present

## 2024-02-17 DIAGNOSIS — D6869 Other thrombophilia: Secondary | ICD-10-CM | POA: Insufficient documentation

## 2024-02-17 DIAGNOSIS — Z7985 Long-term (current) use of injectable non-insulin antidiabetic drugs: Secondary | ICD-10-CM | POA: Insufficient documentation

## 2024-02-17 DIAGNOSIS — Q2112 Patent foramen ovale: Secondary | ICD-10-CM | POA: Insufficient documentation

## 2024-02-17 DIAGNOSIS — Z6841 Body Mass Index (BMI) 40.0 and over, adult: Secondary | ICD-10-CM | POA: Insufficient documentation

## 2024-02-17 DIAGNOSIS — Z8673 Personal history of transient ischemic attack (TIA), and cerebral infarction without residual deficits: Secondary | ICD-10-CM | POA: Insufficient documentation

## 2024-02-17 DIAGNOSIS — F419 Anxiety disorder, unspecified: Secondary | ICD-10-CM

## 2024-02-17 DIAGNOSIS — I4891 Unspecified atrial fibrillation: Secondary | ICD-10-CM | POA: Diagnosis not present

## 2024-02-17 DIAGNOSIS — Z7901 Long term (current) use of anticoagulants: Secondary | ICD-10-CM | POA: Diagnosis not present

## 2024-02-17 HISTORY — PX: CARDIOVERSION: EP1203

## 2024-02-17 MED ORDER — SODIUM CHLORIDE 0.9 % IV SOLN: INTRAVENOUS | Status: DC

## 2024-02-17 MED ORDER — LIDOCAINE 2% (20 MG/ML) 5 ML SYRINGE
INTRAMUSCULAR | Status: DC | PRN
  Administered 2024-02-17: 100 mg via INTRAVENOUS

## 2024-02-17 MED ORDER — PROPOFOL 10 MG/ML IV BOLUS
INTRAVENOUS | Status: DC | PRN
  Administered 2024-02-17: 80 mg via INTRAVENOUS

## 2024-02-17 NOTE — Interval H&P Note (Signed)
 History and Physical Interval Note:  02/17/2024 9:20 AM  Justin Molina.  has presented today for surgery, with the diagnosis of afib.  The various methods of treatment have been discussed with the patient and family. After consideration of risks, benefits and other options for treatment, the patient has consented to  Procedures: CARDIOVERSION (N/A) as a surgical intervention.  The patient's history has been reviewed, patient examined, no change in status, stable for surgery.  I have reviewed the patient's chart and labs.  Questions were answered to the patient's satisfaction.     [name]

## 2024-02-17 NOTE — CV Procedure (Signed)
" ° °  CARDIOVERSION    Procedure:   DCCV  Indication:  Symptomatic atrial fibrillation  Procedure Note:  The patient signed informed consent.  The patient has had therapeutic oral anticoagulation uninterrupted for greater than 3 weeks.  Anesthesia was administered per the anesthesia team.  Adequate airway was maintained throughout and vital followed per protocol.  The patient was cardioverted x 1 with 360J of biphasic synchronized energy.  The patient converted to NSR.  There were no apparent complications.  The patient had normal neuro status and respiratory status post procedure with vitals stable as recorded elsewhere.     Rawson Minix T. Floretta HEATH, MD [place]  [facility]  10:46 AM  "

## 2024-02-17 NOTE — Transfer of Care (Signed)
 Immediate Anesthesia Transfer of Care Note  Patient: Justin Molina.  Procedure(s) Performed: CARDIOVERSION  Patient Location: PACU and Cath Lab  Anesthesia Type GEN  Level of Consciousness: awake and sedated  Airway & Oxygen Therapy: Patient Spontanous Breathing  Post-op Assessment: Report given to RN and Post -op Vital signs reviewed and stable  Post vital signs: Reviewed and stable  Last Vitals:  Vitals Value Taken Time  BP    Temp    Pulse    Resp    SpO2      Last Pain: There were no vitals filed for this visit.       Complications: No notable events documented.

## 2024-02-17 NOTE — Anesthesia Postprocedure Evaluation (Signed)
"   Anesthesia Post Note  Patient: Justin Molina.  Procedure(s) Performed: CARDIOVERSION     Patient location during evaluation: PACU Anesthesia Type: General Level of consciousness: awake and alert Pain management: pain level controlled Vital Signs Assessment: post-procedure vital signs reviewed and stable Respiratory status: spontaneous breathing, nonlabored ventilation and respiratory function stable Cardiovascular status: blood pressure returned to baseline and stable Postop Assessment: no apparent nausea or vomiting Anesthetic complications: no   No notable events documented.  Last Vitals:  Vitals:   02/17/24 1038 02/17/24 1040  BP: 130/83 130/83  Pulse: 71 72  Resp: (!) 24 (!) 22  SpO2: 96% 96%    Last Pain: There were no vitals filed for this visit.               Butler Levander Pinal      "

## 2024-02-17 NOTE — Discharge Instructions (Signed)
 Electrical Cardioversion Electrical cardioversion is the delivery of a jolt of electricity to restore a normal rhythm to the heart. A rhythm that is too fast or is not regular keeps the heart from pumping well. In this procedure, sticky patches or metal paddles are placed on the chest to deliver electricity to the heart from a device. This procedure may be done in an emergency if: There is low or no blood pressure as a result of the heart rhythm. Normal rhythm must be restored as fast as possible to protect the brain and heart from further damage. It may save a life. This may also be a scheduled procedure for irregular or fast heart rhythms that are not immediately life-threatening.  What can I expect after the procedure? Your blood pressure, heart rate, breathing rate, and blood oxygen level will be monitored until you leave the hospital or clinic. Your heart rhythm will be watched to make sure it does not change. You may have some redness on the skin where the shocks were given. Over the counter cortizone cream may be helpful.  Follow these instructions at home: Do not drive for 24 hours if you were given a sedative during your procedure. Take over-the-counter and prescription medicines only as told by your health care provider. Ask your health care provider how to check your pulse. Check it often. Rest for 48 hours after the procedure or as told by your health care provider. Avoid or limit your caffeine use as told by your health care provider. Keep all follow-up visits as told by your health care provider. This is important. Contact a health care provider if: You feel like your heart is beating too quickly or your pulse is not regular. You have a serious muscle cramp that does not go away. Get help right away if: You have discomfort in your chest. You are dizzy or you feel faint. You have trouble breathing or you are short of breath. Your speech is slurred. You have trouble moving an  arm or leg on one side of your body. Your fingers or toes turn cold or blue. Summary Electrical cardioversion is the delivery of a jolt of electricity to restore a normal rhythm to the heart. This procedure may be done right away in an emergency or may be a scheduled procedure if the condition is not an emergency. Generally, this is a safe procedure. After the procedure, check your pulse often as told by your health care provider. This information is not intended to replace advice given to you by your health care provider. Make sure you discuss any questions you have with your health care provider. Document Revised: 08/25/2018 Document Reviewed: 08/25/2018 Elsevier Patient EducatiElectrical Cardioversion Electrical cardioversion is the delivery of a jolt of electricity to restore a normal rhythm to the heart. A rhythm that is too fast or is not regular (arrhythmia) keeps the heart from pumping blood well. There is also another type of cardioversion called a chemical (pharmacologic) cardioversion. This is when your health care provider gives you one or more medicines to bring back your regular heart rhythm. Electrical cardioversion is done as a scheduled procedure for arrhythmiasthat are not life-threatening. Electrical cardioversion may also be done in an emergency for sudden life-threatening arrhythmias. Tell a health care provider about: Any allergies you have. All medicines you are taking, including vitamins, herbs, eye drops, creams, and over-the-counter medicines. Any problems you or family members have had with sedatives or anesthesia. Any bleeding problems you have. Any surgeries you  have had, including a pacemaker, defibrillator, or other implanted device. Any medical conditions you have. Whether you are pregnant or may be pregnant. What are the risks? Your provider will talk with you about risks. These include: Allergic reactions to medicines. Irritation to the skin on your chest or  back where the sticky pads (electrodes) or paddles were put during electrical cardioversion. A blood clot that breaks free and travels to other parts of your body, such as your brain. Return of a worse abnormal heart rhythm that will need to be treated with medicines, a pacemaker, or an implantable cardioverter defibrillator (ICD). What happens before the procedure? Medicines Your provider may give you: Blood-thinning medicines (anticoagulants) so your blood does not clot as easily. If your provider gives you this medicine, you may need to take it for 4 weeks before the procedure. Medicines to help stabilize your heart rate and rhythm. Ask your provider about: Changing or stopping your regular medicines. These include any diabetes medicines or blood thinners you take. Taking medicines such as aspirin and ibuprofen. These medicines can thin your blood. Do not take them unless your provider tells you to. Taking over-the-counter medicines, vitamins, herbs, and supplements. General instructions Follow instructions from your provider about what you may eat and drink. Do not put any lotions, powders, or ointments on your chest and back for 24 hours before the procedure. They can cause problems with the electrodes or paddles used to deliver electricity to your heart. Do not wear jewelry as this can interfere with delivering electricity to your heart. If you will be going home right after the procedure, plan to have a responsible adult: Take you home from the hospital or clinic. You will not be allowed to drive. Care for you for the time you are told. Tests You may have an exam or testing. This may include: Blood labs. A transesophageal echocardiogram (TEE). What happens during the procedure?     An IV will be inserted into one of your veins. You will be given a sedative. This helps you relax. Electrodes or metal paddles will be placed on your chest. They may be placed in one of these ways: One  placed on your right chest, the other on the left ribs. One placed on your chest and the other on your back. An electrical shock will be delivered. The shock briefly stops (resets) your heart rhythm. Your provider will check to see if your heart rhythm is now normal. Some people need only one shock. Some need more to restore a normal heart rhythm. The procedure may vary among providers and hospitals. What happens after the procedure? Your blood pressure, heart rate, breathing rate, and blood oxygen level will be monitored until you leave the hospital or clinic. Your heart rhythm will be watched to make sure it does not change. This information is not intended to replace advice given to you by your health care provider. Make sure you discuss any questions you have with your health care provider. Document Revised: 09/14/2021 Document Reviewed: 09/14/2021 Elsevier Patient Education  2024 Elsevier Inc[employer]on  2020 [employer].

## 2024-02-17 NOTE — Anesthesia Preprocedure Evaluation (Addendum)
"                                    Anesthesia Evaluation  Patient identified by MRN, date of birth, ID band Patient awake    Reviewed: Allergy & Precautions, H&P , NPO status , Patient's Chart, lab work & pertinent test results  History of Anesthesia Complications Negative for: history of anesthetic complications  Airway Mallampati: II  TM Distance: >3 FB Neck ROM: Full    Dental no notable dental hx.    Pulmonary neg pulmonary ROS   Pulmonary exam normal breath sounds clear to auscultation       Cardiovascular hypertension, (-) angina Normal cardiovascular exam+ dysrhythmias Atrial Fibrillation  Rhythm:Regular Rate:Normal     Neuro/Psych   Anxiety     S/p CVA with right body hemiparesis Some residual aphasia  Residual Symptoms negative neurological ROS  negative psych ROS   GI/Hepatic negative GI ROS, Neg liver ROS,,,  Endo/Other  diabetes, Type 2  Class 3 obesity  Renal/GU CRFnegative Renal ROS  negative genitourinary   Musculoskeletal negative musculoskeletal ROS (+)    Abdominal  (+) + obese  Peds negative pediatric ROS (+)  Hematology negative hematology ROS (+)   Anesthesia Other Findings   Reproductive/Obstetrics negative OB ROS                              Anesthesia Physical Anesthesia Plan  ASA: 3  Anesthesia Plan: General   Post-op Pain Management:    Induction: Intravenous  PONV Risk Score and Plan: 2 and Treatment may vary due to age or medical condition  Airway Management Planned: Natural Airway and Simple Face Mask  Additional Equipment: None  Intra-op Plan:   Post-operative Plan: Extubation in OR  Informed Consent: I have reviewed the patients History and Physical, chart, labs and discussed the procedure including the risks, benefits and alternatives for the proposed anesthesia with the patient or authorized representative who has indicated his/her understanding and acceptance.      Dental advisory given  Plan Discussed with: CRNA  Anesthesia Plan Comments:          Anesthesia Quick Evaluation  "

## 2024-02-19 ENCOUNTER — Other Ambulatory Visit: Payer: Self-pay | Admitting: Family Medicine

## 2024-02-19 DIAGNOSIS — R2681 Unsteadiness on feet: Secondary | ICD-10-CM

## 2024-02-19 DIAGNOSIS — I6930 Unspecified sequelae of cerebral infarction: Secondary | ICD-10-CM

## 2024-02-19 NOTE — Progress Notes (Signed)
 Placing HH PT orders by patient request, as unable to participate in outpatient PT.

## 2024-02-20 ENCOUNTER — Other Ambulatory Visit (HOSPITAL_COMMUNITY): Payer: Self-pay

## 2024-02-20 ENCOUNTER — Encounter: Payer: Self-pay | Admitting: Family Medicine

## 2024-02-20 ENCOUNTER — Ambulatory Visit (INDEPENDENT_AMBULATORY_CARE_PROVIDER_SITE_OTHER): Payer: Self-pay | Admitting: Family Medicine

## 2024-02-20 VITALS — BP 143/96 | HR 99 | Ht 71.0 in | Wt 295.4 lb

## 2024-02-20 DIAGNOSIS — E162 Hypoglycemia, unspecified: Secondary | ICD-10-CM | POA: Diagnosis not present

## 2024-02-20 DIAGNOSIS — I5032 Chronic diastolic (congestive) heart failure: Secondary | ICD-10-CM | POA: Diagnosis not present

## 2024-02-20 DIAGNOSIS — N1831 Chronic kidney disease, stage 3a: Secondary | ICD-10-CM | POA: Diagnosis not present

## 2024-02-20 DIAGNOSIS — I4819 Other persistent atrial fibrillation: Secondary | ICD-10-CM

## 2024-02-20 DIAGNOSIS — Z1211 Encounter for screening for malignant neoplasm of colon: Secondary | ICD-10-CM | POA: Diagnosis not present

## 2024-02-20 DIAGNOSIS — I10 Essential (primary) hypertension: Secondary | ICD-10-CM

## 2024-02-20 DIAGNOSIS — E1122 Type 2 diabetes mellitus with diabetic chronic kidney disease: Secondary | ICD-10-CM

## 2024-02-20 LAB — POCT GLYCOSYLATED HEMOGLOBIN (HGB A1C): HbA1c, POC (controlled diabetic range): 6.6 % (ref 0.0–7.0)

## 2024-02-20 NOTE — Patient Instructions (Signed)
 It was great to see you today! Thank you for choosing [facility] for your primary care.  Today we addressed: Diabetes Your A1c is improved to 6.6, however you are having low blood sugar episodes.  If you have 1 of these episodes, please drink some juice and eat some hard candy.  I would like you to hold your Ozempic for now and we will consider restarting it at a later time.  You can take your Jardiance .  Heart failure  I am concerned about your heart failure today.  I would like to order an echocardiogram or an ultrasound of your heart.  I would also like you to continue taking 80 mg of torsemide  daily.  I am checking some labs today.  If they are concerning, I will call you to go to the hospital.  Persistent atrial fibrillation You are in A-fib today.  Please talk to cardiologist about next steps at your next appointment in February.   We are checking some labs today, including BMP and BNP.  You will get a MyChart message or a letter if results are normal. Otherwise, you will get a call from us .  You should return to our clinic in 1 week.  Thank you for coming to see us  at [facility] and for the opportunity to care for you! Keliah Harned, MD 02/20/2024, 4:10 PM

## 2024-02-20 NOTE — Assessment & Plan Note (Signed)
 Patient is in A-fib today.  Considering that he has had 2 cardioversions, suspect that A-fib was likely persistent at this point.  Cardiology follow-up scheduled for February 2.

## 2024-02-20 NOTE — Progress Notes (Signed)
 "  SUBJECTIVE:   CHIEF COMPLAINT / HPI:  Justin Molina. is a 64 y.o. male with a pertinent past medical history of T2DM, HTN, CKD, HLD, aphasia s/p remote CVA, right hemiplegia, OSA, Afib, HFpEF, and CAD presenting to the clinic for T2DM and HFpEF follow up.  Type 2 diabetes mellitus - Prior A1c 9.4 in October 2025 - Home CGM BGs: A few lows in 60s-70s at night, 200s after meals - Medications: Jardiance  10 mg, Ozempic 0.25 mg daily - Hypoglycemia plan: Verbalized to drink juice and eat hard candy - Adherence: Has been holding Ozempic for a week due to cardioversion - Eye exam: DUE - Foot exam: DUE - Microalbumin: DUE - Statin: Atorvastatin  80 mg daily - No symptoms of polyuria, polydipsia, numbness of extremities, foot ulcers/trauma - Mild weakness and pallor with hypoglycemic episodes described above  HFpEF Patient's weight is increased to 295.  Dry weight ~280 lbs based on most recent discharge in November.  Wife has been giving 80 mg torsemide  for the past few weeks, up from baseline dose of 40 mg.  Reportedly patient is urinating frequently. Patient continues taking: - Jardiance  10 mg (holding for the past few weeks) - Carvedilol  25 mg - Losartan  25 mg - Torsemide  40 mg  Persistent Afib Patient underwent outpatient synchronized cardioversion on 1/12 and converted to NSR. 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   OBJECTIVE:   BP (!) 143/96   Pulse 99   Ht 5' 11 (1.803 m)   Wt 295 lb 6 oz (134 kg)   SpO2 99%   BMI 41.20 kg/m   General: Age-appropriate, resting comfortably in chair, NAD, alert and at baseline. Cardiovascular: JVD 3 cm above clavicle.  Normal rate and irregular rhythm. Normal S1/S2. No murmurs, rubs, or gallops appreciated. 2+ radial pulses bilaterally. Pulmonary: Clear bilaterally to  ascultation. No wheezes, crackles, or rhonchi. Normal WOB on room air. No accessory muscle use. Extremities: 2+ peripheral pitting edema bilaterally to knees. Capillary refill <2 seconds.  Diabetic Foot Exam - Simple   Simple Foot Form Diabetic Foot exam was performed with the following findings: Yes 02/20/2024  4:30 PM  Visual Inspection See comments: Yes Sensation Testing See comments: Yes Pulse Check See comments: Yes Comments Diabetic foot exam with monofilament shows minimal sensation and discrimination over all toes and plantar surfaces of feet bilaterally.  Plantar calluses bilaterally but no lesions.  Onychomycosis.  1+ DP and PT pulses bilaterally.     ASSESSMENT/PLAN:   Assessment & Plan Chronic heart failure with preserved ejection fraction (HFpEF, >= 50%) (HCC) The patient appears to be hypervolemic at this time.  He is at 295 pounds, dry weight 280-285.  He has some JVD and bilateral peripheral pitting edema.  Patient's spouse does report increased dyspnea with exertion.  He is reassuringly oxygenating well and has no dyspnea at rest without new oxygen requirement.  Do not see indication for emergent hospitalization at this time, but will obtain BNP and evaluate from there.  Depend on BNP, patient may need to present to ED for further evaluation. - Agree with increasing torsemide  to 80 mg daily from 40 mg, may consider increasing further pending creatinine; must use caution given patient's CKD - BMP - BNP - Update echocardiogram, last 6 months ago - Clear return and EMS/emergency precautions provided in the  event of worsening dyspnea Type 2 diabetes mellitus with stage 3a chronic kidney disease, without long-term current use of insulin  (HCC) Hypoglycemia A1c greatly improved to 6.6 today and now at goal.  Patient is additionally having hypoglycemic episodes. - Hold Ozempic 0.25 mg - Continue Jardiance  10 mg - Verbalized hypoglycemia plan to drink juice and eat hard candy,  provided education on EMS precautions - Continue CGM, verify lows with manual CBG stick - BMP - Urine ACR - Foot exam as above Essential hypertension Borderline today but still above goal, wonder if patient is volume overloaded and this could be contributing.  Will continue to monitor for now. Persistent atrial fibrillation (HCC) Patient is in A-fib today.  Considering that he has had 2 cardioversions, suspect that A-fib was likely persistent at this point.  Cardiology follow-up scheduled for February 2. Colon cancer screening Reportedly never previously completed colonoscopy. - Ambulatory referral to GI  Close follow-up in 1 week.  [name], MD [facility] "

## 2024-02-20 NOTE — Assessment & Plan Note (Signed)
 The patient appears to be hypervolemic at this time.  He is at 295 pounds, dry weight 280-285.  He has some JVD and bilateral peripheral pitting edema.  Patient's spouse does report increased dyspnea with exertion.  He is reassuringly oxygenating well and has no dyspnea at rest without new oxygen requirement.  Do not see indication for emergent hospitalization at this time, but will obtain BNP and evaluate from there.  Depend on BNP, patient may need to present to ED for further evaluation. - Agree with increasing torsemide  to 80 mg daily from 40 mg, may consider increasing further pending creatinine; must use caution given patient's CKD - BMP - BNP - Update echocardiogram, last 6 months ago - Clear return and EMS/emergency precautions provided in the event of worsening dyspnea

## 2024-02-20 NOTE — Assessment & Plan Note (Signed)
 Borderline today but still above goal, wonder if patient is volume overloaded and this could be contributing.  Will continue to monitor for now.

## 2024-02-20 NOTE — Assessment & Plan Note (Signed)
 A1c greatly improved to 6.6 today and now at goal.  Patient is additionally having hypoglycemic episodes. - Hold Ozempic 0.25 mg - Continue Jardiance  10 mg - Verbalized hypoglycemia plan to drink juice and eat hard candy, provided education on EMS precautions - Continue CGM, verify lows with manual CBG stick - BMP - Urine ACR - Foot exam as above

## 2024-02-21 LAB — MICROALBUMIN / CREATININE URINE RATIO
Creatinine, Urine: 119.1 mg/dL
Microalb/Creat Ratio: 270 mg/g{creat} — ABNORMAL HIGH (ref 0–29)
Microalbumin, Urine: 321.2 ug/mL

## 2024-02-22 LAB — BASIC METABOLIC PANEL WITH GFR
BUN/Creatinine Ratio: 15 (ref 10–24)
BUN: 25 mg/dL (ref 8–27)
CO2: 24 mmol/L (ref 20–29)
Calcium: 8.6 mg/dL (ref 8.6–10.2)
Chloride: 106 mmol/L (ref 96–106)
Creatinine, Ser: 1.66 mg/dL — ABNORMAL HIGH (ref 0.76–1.27)
Glucose: 110 mg/dL — ABNORMAL HIGH (ref 70–99)
Potassium: 3.9 mmol/L (ref 3.5–5.2)
Sodium: 147 mmol/L — ABNORMAL HIGH (ref 134–144)
eGFR: 46 mL/min/1.73 — ABNORMAL LOW

## 2024-02-22 LAB — BRAIN NATRIURETIC PEPTIDE: BNP: 276.7 pg/mL — ABNORMAL HIGH (ref 0.0–100.0)

## 2024-02-24 ENCOUNTER — Ambulatory Visit: Admitting: Podiatry

## 2024-02-24 ENCOUNTER — Encounter: Payer: Self-pay | Admitting: Podiatry

## 2024-02-24 ENCOUNTER — Ambulatory Visit: Payer: Self-pay | Admitting: Family Medicine

## 2024-02-24 DIAGNOSIS — M79675 Pain in left toe(s): Secondary | ICD-10-CM

## 2024-02-24 DIAGNOSIS — Z794 Long term (current) use of insulin: Secondary | ICD-10-CM

## 2024-02-24 DIAGNOSIS — B351 Tinea unguium: Secondary | ICD-10-CM | POA: Diagnosis not present

## 2024-02-24 DIAGNOSIS — M79674 Pain in right toe(s): Secondary | ICD-10-CM | POA: Diagnosis not present

## 2024-02-24 DIAGNOSIS — E1140 Type 2 diabetes mellitus with diabetic neuropathy, unspecified: Secondary | ICD-10-CM

## 2024-02-24 NOTE — Progress Notes (Signed)
 This patient returns to my office for at risk foot care.  This patient requires this care by a professional since this patient will be at risk due to having diabetes.He presents to the office with his wife of 38 years.  This patient is unable to cut nails himself since the patient cannot reach his nails.These nails are painful walking and wearing shoes.  This patient presents for at risk foot care today.  General Appearance  Alert, conversant and in no acute stress.  Vascular  Dorsalis pedis and posterior tibial  pulses are palpable  bilaterally.  Capillary return is within normal limits  bilaterally. Temperature is within normal limits  bilaterally.  Neurologic  Senn-Weinstein monofilament wire test  diminished bilaterally. Muscle power within normal limits bilaterally.  Nails Thick disfigured discolored nails with subungual debris  from hallux to fifth toes bilaterally. No evidence of bacterial infection or drainage bilaterally.  Orthopedic  No limitations of motion  feet .  No crepitus or effusions noted.  No bony pathology or digital deformities noted. Foot drop.  Skin  normotropic skin with no porokeratosis noted bilaterally.  No signs of infections or ulcers noted.     Onychomycosis  Pain in right toes  Pain in left toes  Foot drop.  Consent was obtained for treatment procedures.   Mechanical debridement of nails 1-5  bilaterally performed with a nail nipper.  Filed with dremel without incident.    Return office visit     3 months                 Told patient to return for periodic foot care and evaluation due to potential at risk complications.   Cordella Bold DPM

## 2024-02-24 NOTE — Progress Notes (Signed)
 OVERVIEW - Cr and K stable in setting of CKD on increased dose of torsemide  80 mg BID. - Microalbumin/creatinine ratio suggests renal impairment, but A1c and BP at goal and patient  on ARB for renal protection. - BNP elevated to 276, which is increased relative to hospital discharge, but BNP has ranged 300+ to 746 in the past during exacerbations.  In the setting of weight gain and DoE, suspect increased hypervolemia.  PHONE CALL Called patient, spoke with his wife who is patient's healthcare representative. Confirmed DOB. Reportedly, patient's weight is 5 lbs down, which is reassuring. Dyspnea on exertion remains stable.  No dyspnea at rest still. Do not favor hospitalization at this time, but will monitor closely.  Instructed spouse to monitor weight closely.  Instructed her to call clinic if patient has more than 3 lbs of weight gain in 1 day or 5 lbs in 2 days. Did not see follow up scheduled, recommended calling back to schedule follow up with me in 1-2 weeks. ED precautions if patient becomes more dyspneic at rest or has other respiratory distress.  Justin Christiana Toma, MD PGY-2, [facility] 02/24/24, 1:57 PM

## 2024-03-04 ENCOUNTER — Telehealth: Payer: Self-pay | Admitting: Cardiology

## 2024-03-04 NOTE — Telephone Encounter (Signed)
 Spoke with Sasha with [facility]. Waiting for response from [facility] before they are able to start therapy on patient. Updating office. I am not sure who ordered home health, but will send to Dr Kate for update. Closing encounter

## 2024-03-04 NOTE — Telephone Encounter (Signed)
 Called to say there will be delay in service for the patient, due to unable to contact the patient. Wanted to make the office aware. Please advise

## 2024-03-09 ENCOUNTER — Ambulatory Visit (HOSPITAL_COMMUNITY): Admitting: Nurse Practitioner

## 2024-03-09 NOTE — Progress Notes (Incomplete)
 "  Primary Care Physician: Kate Lonni CROME, MD Primary Cardiologist: Lonni CROME Kate, MD Electrophysiologist: None  Referring Physician: Lonni CROME Kate, MD   Justin Catha Raddle. is a 64 y.o. 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  Justin Molina was seen initially in the AF clinic on 02/11/2023 to establish care and for management of AF.  He had recently underwent DCCV secondary to CHF exacerbation on 01/07/2024 with conversion back to AF on 01/20/2024.  During his visit he was in rate controlled AF and reported no missed doses of Xarelto.  He had Cardizem  increased to 360 mg and amlodipine  was discontinued with DCCV repeat DCCV completed 02/17/2024.  we discussed pursuing possible antiarrhythmic medication such as Tikosyn or referral to EP if early return of AF occurred.    Today, he denies symptoms of ***palpitations, chest pain, shortness of breath, orthopnea, PND, lower extremity edema, dizziness, presyncope, syncope, snoring, daytime somnolence, bleeding, or neurologic sequela. The patient is tolerating medications without difficulties and is otherwise without complaint today.   ***Discussed the use of AI scribe software for clinical note transcription with the patient, who gave verbal consent to proceed.   Notes: History of CKD - Amiodarone  - No class Ic due to CHF and  Atrial Fibrillation Management history: Previous antiarrhythmic drugs: Multaq  DC'd due to CHF Previous cardioversions: 01/07/2024 and 02/17/2024 Previous ablations: None Anticoagulation history: Eliquis     ROS- All systems are reviewed and negative except as per the HPI above.  Past Medical History:  Diagnosis Date   Acute on chronic diastolic CHF (congestive heart failure) (HCC) 03/18/2023   Acute on chronic diastolic heart failure (HCC)  96/95/7974   Atrial fibrillation with RVR (HCC) 12/01/2023   Atrial fibrillation, chronic (HCC) 01/04/2021   Chronic renal insufficiency    Diabetes mellitus without complication (HCC)    HTN (hypertension) 08/12/2014   hypertension     Hypertension    Paroxysmal A-fib (HCC) 12/01/2023   Stroke [facility])    Past Surgical History:  Procedure Laterality Date   BRAIN SURGERY     CARDIOVERSION N/A 01/07/2024   Procedure: CARDIOVERSION;  Surgeon: Lonni Slain, MD;  Location: [facility] INVASIVE CV LAB;  Service: Cardiovascular;  Laterality: N/A;   CARDIOVERSION N/A 02/17/2024   Procedure: CARDIOVERSION;  Surgeon: Floretta Mallard, MD;  Location: [facility] INVASIVE CV LAB;  Service: Cardiovascular;  Laterality: N/A;   GASTROSTOMY TUBE PLACEMENT     HERNIA REPAIR     TRACHEOSTOMY     Contrast media [iodinated contrast media] and Iodine Current Outpatient Medications  Medication Sig Dispense Refill   acetaminophen  (TYLENOL ) 500 MG tablet Take 1 tablet (500 mg total) by mouth every 6 (six) hours as needed. 30 tablet 0   albuterol  (VENTOLIN  HFA) 108 (90 Base) MCG/ACT inhaler Inhale 2 puffs into the lungs every 4 (four) hours as needed for wheezing or shortness of breath.     atorvastatin  (LIPITOR ) 80 MG tablet Take 1 tablet (80 mg total) by mouth at bedtime. 30 tablet 0   Blood Pressure Monitoring (OMRON 3 SERIES BP MONITOR) DEVI Use to check blood pressure daily. 1 each 0   carvedilol  (COREG ) 25 MG tablet Take 25 mg by mouth 2 (two) times daily with a meal.     diltiazem  (CARDIZEM  CD) 360 MG 24 hr capsule Take 1 capsule (360 mg total) by mouth daily. 30 capsule  3   doxazosin  (CARDURA ) 4 MG tablet Take 4 mg by mouth at bedtime.     ELIQUIS  5 MG TABS tablet Take 1 tablet (5 mg total) by mouth 2 (two) times daily. 180 tablet 0   empagliflozin  (JARDIANCE ) 10 MG TABS tablet Take 1 tablet (10 mg total) by mouth daily. 30 tablet 0   fluticasone (FLONASE) 50 MCG/ACT nasal spray Place 2 sprays into both  nostrils daily as needed for allergies or rhinitis.     glucose blood (ACCU-CHEK AVIVA PLUS) test strip Use as instructed for TID and QHS blood glucose testing 100 each 12   isosorbide  mononitrate (IMDUR ) 120 MG 24 hr tablet TAKE 1 TABLET BY MOUTH ONE TIME A DAY 90 tablet 0   Lancets 28G MISC Check blood sugar TID & QHS 100 each 12   loratadine  (CLARITIN ) 10 MG tablet Take 10 mg by mouth daily as needed for allergies or rhinitis.     losartan  (COZAAR ) 25 MG tablet Take 25 mg by mouth daily.     nystatin cream (MYCOSTATIN) Apply 1 Application topically daily as needed (yeast).     OZEMPIC, 0.25 OR 0.5 MG/DOSE, 2 MG/3ML SOPN Inject 0.25 mg into the skin every Monday.     potassium chloride  SA (KLOR-CON  M) 20 MEQ tablet Take 1 tablet (20 mEq total) by mouth daily.     torsemide  (DEMADEX ) 20 MG tablet Take 2 tablets (40 mg total) by mouth daily. Can take an additional tablet prn shortness of breath 60 tablet 0   No current facility-administered medications for this visit.    Physical Exam: There were no vitals taken for this visit.  GEN: Well nourished, well developed in no acute distress NECK: No JVD; No carotid bruits CARDIAC: {EPRHYTHM:28826}, no murmurs, rubs, gallops RESPIRATORY:  Clear to auscultation without rales, wheezing or rhonchi  ABDOMEN: Soft, non-tender, non-distended EXTREMITIES:  No edema; No deformity   Wt Readings from Last 3 Encounters:  02/20/24 134 kg  02/11/24 132.3 kg  01/20/24 122.9 kg    Lab Results  Component Value Date   TSH 0.831 04/09/2023   EKG today demonstrates:   EKG Interpretation Date/Time:    Ventricular Rate:    PR Interval:    QRS Duration:    QT Interval:    QTC Calculation:   R Axis:      Text Interpretation:          Echo Completed ***  CHA2DS2-VASc Score = 5  The patient's score is based upon: CHF History: 1 HTN History: 1 Diabetes History: 1 Stroke History: 2 Vascular Disease History: 0 Age Score: 0 Gender Score: 0    {Confirm score is correct.  If not, click here to update score.  REFRESH note.  :1}    ASSESSMENT AND PLAN: Persistent Atrial Fibrillation (ICD10:  I48.19) The patient's CHA2DS2-VASc score is 5, indicating a 7.2% annual risk of stroke.   - DCCV completed 01/07/2024 with early return and rate control increased with repeat DCCV 02/17/2024 -Today patient is***  HFpEF: - 2D echo was completed on 08/2023 showing low normal EF of 50-55% -Today patient is euvolemic on examination and denies any shortness of breath. -Continue current GDMT as prescribed   HTN:  CKD stage IIIb: - Currently followed by nephrology   Secondary Hypercoagulable State (ICD10:  D68.69){Click to add to Prob List or Visit Dx  :[id number]} The patient is at significant risk for stroke/thromboembolism based upon his CHA2DS2-VASc Score of 5.  {Anticoag or No Anticoag  :  [id number]}    Signed,  Wyn Raddle, Jackee Shove, NP    03/09/2024 9:21 AM     {Are you ordering a CV Procedure (e.g. stress test, cath, DCCV, TEE, etc)?   Press F2        :[id number]}  Do not forget to refresh clinic note for EKG  Follow up with the AF Clinic in ***  {Click to Open Review  :1}   "

## 2024-03-10 ENCOUNTER — Ambulatory Visit (HOSPITAL_BASED_OUTPATIENT_CLINIC_OR_DEPARTMENT_OTHER)

## 2024-03-10 NOTE — Progress Notes (Signed)
 "  Primary Care Physician: Kate Lonni CROME, MD Primary Cardiologist: Lonni CROME Kate, MD Electrophysiologist: None  Referring Physician: Lonni CROME Kate, MD   Justin Catha Molina. is a 64 y.o. 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  Justin Molina was seen initially in the AF clinic on 02/11/2023 to establish care and for management of AF.  He had recently underwent DCCV secondary to CHF exacerbation on 01/07/2024 with conversion back to AF on 01/20/2024.  During his visit he was in rate controlled AF and reported no missed doses of Xarelto. He had Cardizem  increased to 360 mg and amlodipine  was discontinued with DCCV repeat DCCV completed 02/17/2024.  we discussed pursuing possible antiarrhythmic medication such as Tikosyn or referral to EP if early return of AF occurred.  Justin Molina presents today with his significant other for post cardioversion follow-up.  On exam today he is unfortunately back in atrial fibrillation with a controlled rate. Despite initial success, normal sinus rhythm was not maintained. He experiences increased fatigue, particularly with activities like walking through the house, which is attributed to his medications and underlying heart conditions. Previously, he was on amiodarone  while residing in [place] , but it is unclear if this continued after moving. Currently, he is on diltiazem , recently increased to 360 mg. He is also on torsemide  for heart failure management. His partner notes he has been on carvedilol  and was previously on amlodipine , which was discontinued due to overlap with diltiazem . His partner is concerned about his medication regimen and seeks a comprehensive understanding of his medications and their interactions.  He has recently established care with a new primary care provider and I advised to  follow-up with their clinical pharmacist for comprehensive review.  Today, he denies symptoms of palpitations, chest pain, shortness of breath, orthopnea, PND, lower extremity edema, dizziness, presyncope, syncope, snoring, daytime somnolence, bleeding, or neurologic sequela. The patient is tolerating medications without difficulties and is otherwise without complaint today.   Discussed the use of AI scribe software for clinical note transcription with the patient, who gave verbal consent to proceed.   Atrial Fibrillation Management history: Previous antiarrhythmic drugs: Multaq  DC'd due to CHF Previous cardioversions: 01/07/2024 and 02/17/2024 Previous ablations: None Anticoagulation history: Eliquis     ROS- All systems are reviewed and negative except as per the HPI above.  Past Medical History:  Diagnosis Date   Acute on chronic diastolic CHF (congestive heart failure) (HCC) 03/18/2023   Acute on chronic diastolic heart failure (HCC) 04/09/2023   Atrial fibrillation with RVR (HCC) 12/01/2023   Atrial fibrillation, chronic (HCC) 01/04/2021   Chronic renal insufficiency    Diabetes mellitus without complication (HCC)    HTN (hypertension) 08/12/2014   hypertension     Hypertension    Paroxysmal A-fib (HCC) 12/01/2023   Stroke [facility])    Past Surgical History:  Procedure Laterality Date   BRAIN SURGERY     CARDIOVERSION N/A 01/07/2024   Procedure: CARDIOVERSION;  Surgeon: Lonni Slain, MD;  Location: [facility] INVASIVE CV LAB;  Service: Cardiovascular;  Laterality: N/A;   CARDIOVERSION N/A 02/17/2024   Procedure: CARDIOVERSION;  Surgeon: Floretta Mallard, MD;  Location: [facility] INVASIVE CV LAB;  Service: Cardiovascular;  Laterality: N/A;   GASTROSTOMY TUBE PLACEMENT     HERNIA REPAIR     TRACHEOSTOMY     Contrast media [iodinated contrast media] and Iodine Current Outpatient  Medications  Medication Sig Dispense Refill   acetaminophen  (TYLENOL ) 500 MG tablet Take 1 tablet (500 mg  total) by mouth every 6 (six) hours as needed. 30 tablet 0   albuterol  (VENTOLIN  HFA) 108 (90 Base) MCG/ACT inhaler Inhale 2 puffs into the lungs every 4 (four) hours as needed for wheezing or shortness of breath.     atorvastatin  (LIPITOR ) 80 MG tablet Take 1 tablet (80 mg total) by mouth at bedtime. 30 tablet 0   Blood Pressure Monitoring (OMRON 3 SERIES BP MONITOR) DEVI Use to check blood pressure daily. 1 each 0   carvedilol  (COREG ) 25 MG tablet Take 25 mg by mouth 2 (two) times daily with a meal.     diltiazem  (CARDIZEM  CD) 360 MG 24 hr capsule Take 1 capsule (360 mg total) by mouth daily. 30 capsule 3   doxazosin  (CARDURA ) 4 MG tablet Take 4 mg by mouth at bedtime.     ELIQUIS  5 MG TABS tablet Take 1 tablet (5 mg total) by mouth 2 (two) times daily. 180 tablet 0   empagliflozin  (JARDIANCE ) 10 MG TABS tablet Take 1 tablet (10 mg total) by mouth daily. 30 tablet 0   fluticasone (FLONASE) 50 MCG/ACT nasal spray Place 2 sprays into both nostrils daily as needed for allergies or rhinitis.     glucose blood (ACCU-CHEK AVIVA PLUS) test strip Use as instructed for TID and QHS blood glucose testing 100 each 12   isosorbide  mononitrate (IMDUR ) 120 MG 24 hr tablet TAKE 1 TABLET BY MOUTH ONE TIME A DAY 90 tablet 0   Lancets 28G MISC Check blood sugar TID & QHS 100 each 12   loratadine  (CLARITIN ) 10 MG tablet Take 10 mg by mouth daily as needed for allergies or rhinitis.     losartan  (COZAAR ) 25 MG tablet Take 25 mg by mouth daily.     nystatin cream (MYCOSTATIN) Apply 1 Application topically daily as needed (yeast).     OZEMPIC, 0.25 OR 0.5 MG/DOSE, 2 MG/3ML SOPN Inject 0.25 mg into the skin every Monday.     potassium chloride  SA (KLOR-CON  M) 20 MEQ tablet Take 1 tablet (20 mEq total) by mouth daily.     torsemide  (DEMADEX ) 20 MG tablet Take 2 tablets (40 mg total) by mouth daily. Can take an additional tablet prn shortness of breath 60 tablet 0   No current facility-administered medications for this  visit.    Physical Exam: There were no vitals taken for this visit.  GEN: Well nourished, well developed in no acute distress NECK: No JVD; No carotid bruits CARDIAC: Irregularly irregular rate and rhythm, no murmurs, rubs, gallops RESPIRATORY:  Clear to auscultation without rales, wheezing or rhonchi  ABDOMEN: Soft, non-tender, non-distended EXTREMITIES:  No edema; No deformity   Wt Readings from Last 3 Encounters:  02/20/24 134 kg  02/11/24 132.3 kg  01/20/24 122.9 kg    Lab Results  Component Value Date   TSH 0.831 04/09/2023   EKG today demonstrates:   EKG Interpretation Date/Time:    Ventricular Rate:    PR Interval:    QRS Duration:    QT Interval:    QTC Calculation:   R Axis:      Text Interpretation:          Echo Completed 08/06/2023:  1. Left ventricular ejection fraction, by estimation, is 50 to 55%. The  left ventricle has low normal function. The left ventricle has no regional  wall motion abnormalities. There is mild left ventricular  hypertrophy.  Left ventricular diastolic  parameters are indeterminate. The average left ventricular global  longitudinal strain is -17.6 %. The global longitudinal strain is normal.   2. Right ventricular systolic function is normal. The right ventricular  size is normal.   3. The mitral valve is normal in structure. No evidence of mitral valve  regurgitation. No evidence of mitral stenosis.   4. The aortic valve is normal in structure. Aortic valve regurgitation is  not visualized. No aortic stenosis is present.   5. The inferior vena cava is normal in size with greater than 50%  respiratory variability, suggesting right atrial pressure of 3 mmHg.   CHA2DS2-VASc Score = 5  The patient's score is based upon: CHF History: 1 HTN History: 1 Diabetes History: 1 Stroke History: 2 Vascular Disease History: 0 Age Score: 0 Gender Score: 0       ASSESSMENT AND PLAN: Persistent Atrial Fibrillation (ICD10:   I48.19) The patient's CHA2DS2-VASc score is 5, indicating a 7.2% annual risk of stroke.   - DCCV completed 01/07/2024 with early return and rate control increased with repeat DCCV 02/17/2024 -Recurrent AFib despite cardioversion. Fatigue likely due to AFib and chronic heart failure. Amiodarone  chosen due to renal function. Ablation discussed for long-term control. - Started amiodarone  200 mg twice daily for four weeks, then 200 mg daily. - Reduced diltiazem  to 120 mg to prevent bradycardia. - Scheduled follow-up in three weeks for EKG to assess rhythm conversion. - Plan cardioversion if rhythm not converted. -Continue Eliquis  5 mg twice daily - Refer to electrophysiologist for ablation candidacy assessment.  HFpEF: - 2D echo was completed on 08/2023 showing low normal EF of 50-55% -Today patient is euvolemic on examination and denies any shortness of breath. -Continue current GDMT as prescribed   HTN: BP well controlled. Continue current antihypertensive regimen.   CKD stage IIIb: - Currently followed by nephrology  Secondary Hypercoagulable State (ICD10:  [phone]) The patient is at significant risk for stroke/thromboembolism based upon his CHA2DS2-VASc Score of 5.  Continue Apixaban  (Eliquis ).   Signed,  Justin Molina, Justin Shove, Justin Molina    03/10/2024 11:37 AM    Informed Consent   Shared Decision Making/Informed Consent The risks (stroke, cardiac arrhythmias rarely resulting in the need for a temporary or permanent pacemaker, skin irritation or burns and complications associated with conscious sedation including aspiration, arrhythmia, respiratory failure and death), benefits (restoration of normal sinus rhythm) and alternatives of a direct current cardioversion were explained in detail to Mr. Cederberg and he agrees to proceed.       Follow up with the AF Clinic in 3 weeks     "

## 2024-03-11 ENCOUNTER — Ambulatory Visit (HOSPITAL_COMMUNITY)
Admission: RE | Admit: 2024-03-11 | Discharge: 2024-03-11 | Attending: Nurse Practitioner | Admitting: Nurse Practitioner

## 2024-03-11 ENCOUNTER — Other Ambulatory Visit (HOSPITAL_BASED_OUTPATIENT_CLINIC_OR_DEPARTMENT_OTHER)

## 2024-03-11 ENCOUNTER — Encounter (HOSPITAL_COMMUNITY): Payer: Self-pay | Admitting: Nurse Practitioner

## 2024-03-11 VITALS — BP 130/82 | HR 60 | Ht 71.0 in | Wt 292.6 lb

## 2024-03-11 DIAGNOSIS — D6859 Other primary thrombophilia: Secondary | ICD-10-CM | POA: Diagnosis not present

## 2024-03-11 DIAGNOSIS — N1832 Chronic kidney disease, stage 3b: Secondary | ICD-10-CM

## 2024-03-11 DIAGNOSIS — I10 Essential (primary) hypertension: Secondary | ICD-10-CM | POA: Diagnosis not present

## 2024-03-11 DIAGNOSIS — I5032 Chronic diastolic (congestive) heart failure: Secondary | ICD-10-CM

## 2024-03-11 DIAGNOSIS — I4891 Unspecified atrial fibrillation: Secondary | ICD-10-CM

## 2024-03-11 DIAGNOSIS — I480 Paroxysmal atrial fibrillation: Secondary | ICD-10-CM | POA: Diagnosis not present

## 2024-03-11 MED ORDER — AMIODARONE HCL 200 MG PO TABS: ORAL_TABLET | ORAL | 3 refills | Status: AC

## 2024-03-11 MED ORDER — DILTIAZEM HCL ER COATED BEADS 120 MG PO CP24: 120.0000 mg | ORAL_CAPSULE | Freq: Every day | ORAL | 3 refills | Status: AC

## 2024-03-11 NOTE — Patient Instructions (Signed)
 Decrease diltiazem  120 mg once daily   Start Amiodarone  200 mg twice a day for 30 days then decrease to 200 mg once a day

## 2024-03-12 ENCOUNTER — Other Ambulatory Visit (HOSPITAL_BASED_OUTPATIENT_CLINIC_OR_DEPARTMENT_OTHER)

## 2024-03-27 ENCOUNTER — Ambulatory Visit (HOSPITAL_COMMUNITY)

## 2024-04-01 ENCOUNTER — Ambulatory Visit (HOSPITAL_COMMUNITY): Admitting: Nurse Practitioner
